# Patient Record
Sex: Male | Born: 1940
Health system: Southern US, Community
[De-identification: ages and names within clinical notes are randomized; demographics above are authoritative.]

## PROBLEM LIST (undated history)

## (undated) DIAGNOSIS — Z923 Personal history of irradiation: Secondary | ICD-10-CM

## (undated) DIAGNOSIS — Z21 Asymptomatic human immunodeficiency virus [HIV] infection status: Secondary | ICD-10-CM

## (undated) DIAGNOSIS — J449 Chronic obstructive pulmonary disease, unspecified: Secondary | ICD-10-CM

## (undated) DIAGNOSIS — K759 Inflammatory liver disease, unspecified: Secondary | ICD-10-CM

## (undated) DIAGNOSIS — M1990 Unspecified osteoarthritis, unspecified site: Secondary | ICD-10-CM

## (undated) DIAGNOSIS — M009 Pyogenic arthritis, unspecified: Secondary | ICD-10-CM

## (undated) DIAGNOSIS — Z972 Presence of dental prosthetic device (complete) (partial): Secondary | ICD-10-CM

## (undated) DIAGNOSIS — N183 Chronic kidney disease, stage 3 (moderate): Secondary | ICD-10-CM

## (undated) DIAGNOSIS — C7951 Secondary malignant neoplasm of bone: Secondary | ICD-10-CM

## (undated) DIAGNOSIS — J309 Allergic rhinitis, unspecified: Secondary | ICD-10-CM

## (undated) DIAGNOSIS — K219 Gastro-esophageal reflux disease without esophagitis: Secondary | ICD-10-CM

## (undated) DIAGNOSIS — Z973 Presence of spectacles and contact lenses: Secondary | ICD-10-CM

## (undated) DIAGNOSIS — H9192 Unspecified hearing loss, left ear: Secondary | ICD-10-CM

## (undated) DIAGNOSIS — I10 Essential (primary) hypertension: Secondary | ICD-10-CM

## (undated) DIAGNOSIS — C61 Malignant neoplasm of prostate: Secondary | ICD-10-CM

## (undated) DIAGNOSIS — M47812 Spondylosis without myelopathy or radiculopathy, cervical region: Secondary | ICD-10-CM

## (undated) DIAGNOSIS — B20 Human immunodeficiency virus [HIV] disease: Secondary | ICD-10-CM

## (undated) DIAGNOSIS — F1910 Other psychoactive substance abuse, uncomplicated: Secondary | ICD-10-CM

## (undated) DIAGNOSIS — Z86718 Personal history of other venous thrombosis and embolism: Secondary | ICD-10-CM

## (undated) DIAGNOSIS — J189 Pneumonia, unspecified organism: Secondary | ICD-10-CM

## (undated) DIAGNOSIS — K625 Hemorrhage of anus and rectum: Secondary | ICD-10-CM

## (undated) DIAGNOSIS — F1123 Opioid dependence with withdrawal: Secondary | ICD-10-CM

## (undated) DIAGNOSIS — M47816 Spondylosis without myelopathy or radiculopathy, lumbar region: Secondary | ICD-10-CM

## (undated) HISTORY — DX: Pneumonia, unspecified organism: J18.9

## (undated) HISTORY — DX: Hemorrhage of anus and rectum: K62.5

## (undated) HISTORY — DX: Personal history of irradiation: Z92.3

## (undated) HISTORY — PX: FOOT SURGERY: SHX648

## (undated) HISTORY — PX: HIP SURGERY: SHX245

## (undated) HISTORY — DX: Unspecified osteoarthritis, unspecified site: M19.90

## (undated) HISTORY — DX: Malignant neoplasm of prostate: C61

## (undated) HISTORY — DX: Secondary malignant neoplasm of bone: C79.51

## (undated) HISTORY — PX: HAND SURGERY: SHX662

## (undated) HISTORY — DX: Essential (primary) hypertension: I10

## (undated) HISTORY — DX: Other psychoactive substance abuse, uncomplicated: F19.10

## (undated) HISTORY — DX: Opioid dependence with withdrawal: F11.23

## (undated) HISTORY — DX: Chronic obstructive pulmonary disease, unspecified: J44.9

## (undated) HISTORY — PX: HERNIA REPAIR: SHX51

## (undated) HISTORY — DX: Inflammatory liver disease, unspecified: K75.9

## (undated) HISTORY — DX: Allergic rhinitis, unspecified: J30.9

## (undated) HISTORY — DX: Spondylosis without myelopathy or radiculopathy, lumbar region: M47.816

## (undated) HISTORY — DX: Spondylosis without myelopathy or radiculopathy, cervical region: M47.812

## (undated) HISTORY — DX: Chronic kidney disease, stage 3 (moderate): N18.3

## (undated) HISTORY — DX: Personal history of other venous thrombosis and embolism: Z86.718

## (undated) HISTORY — DX: Unspecified hearing loss, left ear: H91.92

## (undated) HISTORY — DX: Asymptomatic human immunodeficiency virus (hiv) infection status: Z21

## (undated) HISTORY — DX: Human immunodeficiency virus (HIV) disease: B20

---

## 1998-03-13 ENCOUNTER — Emergency Department (HOSPITAL_COMMUNITY): Admission: EM | Admit: 1998-03-13 | Discharge: 1998-03-13 | Payer: Self-pay | Admitting: Emergency Medicine

## 1999-04-05 ENCOUNTER — Encounter: Admission: RE | Admit: 1999-04-05 | Discharge: 1999-04-05 | Payer: Self-pay | Admitting: Infectious Diseases

## 1999-04-05 ENCOUNTER — Ambulatory Visit (HOSPITAL_COMMUNITY): Admission: RE | Admit: 1999-04-05 | Discharge: 1999-04-05 | Payer: Self-pay | Admitting: Infectious Diseases

## 1999-04-30 ENCOUNTER — Ambulatory Visit (HOSPITAL_COMMUNITY): Admission: RE | Admit: 1999-04-30 | Discharge: 1999-04-30 | Payer: Self-pay | Admitting: Infectious Diseases

## 1999-04-30 ENCOUNTER — Encounter: Admission: RE | Admit: 1999-04-30 | Discharge: 1999-04-30 | Payer: Self-pay | Admitting: Hematology and Oncology

## 1999-06-18 ENCOUNTER — Encounter: Admission: RE | Admit: 1999-06-18 | Discharge: 1999-06-18 | Payer: Self-pay | Admitting: Infectious Diseases

## 1999-08-08 ENCOUNTER — Encounter: Admission: RE | Admit: 1999-08-08 | Discharge: 1999-08-08 | Payer: Self-pay | Admitting: Infectious Diseases

## 1999-08-08 ENCOUNTER — Ambulatory Visit (HOSPITAL_COMMUNITY): Admission: RE | Admit: 1999-08-08 | Discharge: 1999-08-08 | Payer: Self-pay | Admitting: Infectious Diseases

## 1999-08-22 ENCOUNTER — Encounter: Admission: RE | Admit: 1999-08-22 | Discharge: 1999-08-22 | Payer: Self-pay | Admitting: Infectious Diseases

## 1999-10-29 ENCOUNTER — Encounter: Admission: RE | Admit: 1999-10-29 | Discharge: 1999-10-29 | Payer: Self-pay | Admitting: Hematology and Oncology

## 1999-10-29 ENCOUNTER — Ambulatory Visit (HOSPITAL_COMMUNITY): Admission: RE | Admit: 1999-10-29 | Discharge: 1999-10-29 | Payer: Self-pay | Admitting: Hematology and Oncology

## 1999-11-12 ENCOUNTER — Encounter: Admission: RE | Admit: 1999-11-12 | Discharge: 1999-11-12 | Payer: Self-pay | Admitting: Infectious Diseases

## 1999-12-10 ENCOUNTER — Encounter: Admission: RE | Admit: 1999-12-10 | Discharge: 1999-12-10 | Payer: Self-pay | Admitting: Infectious Diseases

## 1999-12-10 ENCOUNTER — Ambulatory Visit (HOSPITAL_COMMUNITY): Admission: RE | Admit: 1999-12-10 | Discharge: 1999-12-10 | Payer: Self-pay | Admitting: Infectious Diseases

## 1999-12-27 ENCOUNTER — Ambulatory Visit (HOSPITAL_COMMUNITY): Admission: RE | Admit: 1999-12-27 | Discharge: 1999-12-27 | Payer: Self-pay | Admitting: Family Medicine

## 1999-12-27 ENCOUNTER — Encounter: Payer: Self-pay | Admitting: Family Medicine

## 2000-04-21 ENCOUNTER — Ambulatory Visit (HOSPITAL_COMMUNITY): Admission: RE | Admit: 2000-04-21 | Discharge: 2000-04-21 | Payer: Self-pay | Admitting: Infectious Diseases

## 2000-04-21 ENCOUNTER — Encounter: Admission: RE | Admit: 2000-04-21 | Discharge: 2000-04-21 | Payer: Self-pay | Admitting: Infectious Diseases

## 2000-05-08 ENCOUNTER — Encounter: Admission: RE | Admit: 2000-05-08 | Discharge: 2000-05-08 | Payer: Self-pay | Admitting: Infectious Diseases

## 2000-08-07 ENCOUNTER — Encounter: Admission: RE | Admit: 2000-08-07 | Discharge: 2000-08-07 | Payer: Self-pay | Admitting: Infectious Diseases

## 2000-09-12 ENCOUNTER — Encounter: Admission: RE | Admit: 2000-09-12 | Discharge: 2000-09-12 | Payer: Self-pay | Admitting: Infectious Diseases

## 2001-04-23 ENCOUNTER — Emergency Department (HOSPITAL_COMMUNITY): Admission: RE | Admit: 2001-04-23 | Discharge: 2001-04-23 | Payer: Self-pay | Admitting: Family Medicine

## 2001-04-23 ENCOUNTER — Encounter: Payer: Self-pay | Admitting: Family Medicine

## 2001-05-04 ENCOUNTER — Ambulatory Visit (HOSPITAL_BASED_OUTPATIENT_CLINIC_OR_DEPARTMENT_OTHER): Admission: RE | Admit: 2001-05-04 | Discharge: 2001-05-04 | Payer: Self-pay | Admitting: *Deleted

## 2001-07-19 ENCOUNTER — Emergency Department (HOSPITAL_COMMUNITY): Admission: EM | Admit: 2001-07-19 | Discharge: 2001-07-19 | Payer: Self-pay | Admitting: Emergency Medicine

## 2001-07-19 ENCOUNTER — Encounter: Payer: Self-pay | Admitting: Emergency Medicine

## 2003-04-20 ENCOUNTER — Inpatient Hospital Stay (HOSPITAL_COMMUNITY): Admission: EM | Admit: 2003-04-20 | Discharge: 2003-04-22 | Payer: Self-pay | Admitting: *Deleted

## 2003-04-22 ENCOUNTER — Encounter: Payer: Self-pay | Admitting: Internal Medicine

## 2003-04-26 ENCOUNTER — Encounter: Admission: RE | Admit: 2003-04-26 | Discharge: 2003-04-26 | Payer: Self-pay | Admitting: Family Medicine

## 2004-01-04 ENCOUNTER — Ambulatory Visit (HOSPITAL_COMMUNITY): Admission: RE | Admit: 2004-01-04 | Discharge: 2004-01-04 | Payer: Self-pay | Admitting: Internal Medicine

## 2004-03-29 ENCOUNTER — Ambulatory Visit: Payer: Self-pay | Admitting: Family Medicine

## 2004-05-03 ENCOUNTER — Ambulatory Visit: Payer: Self-pay | Admitting: Family Medicine

## 2004-06-28 ENCOUNTER — Ambulatory Visit: Payer: Self-pay | Admitting: Family Medicine

## 2004-08-19 ENCOUNTER — Emergency Department (HOSPITAL_COMMUNITY): Admission: EM | Admit: 2004-08-19 | Discharge: 2004-08-20 | Payer: Self-pay | Admitting: Emergency Medicine

## 2004-09-03 ENCOUNTER — Ambulatory Visit: Payer: Self-pay | Admitting: Family Medicine

## 2005-07-04 ENCOUNTER — Ambulatory Visit: Payer: Self-pay | Admitting: Internal Medicine

## 2005-07-18 ENCOUNTER — Ambulatory Visit: Payer: Self-pay | Admitting: Internal Medicine

## 2005-08-01 ENCOUNTER — Ambulatory Visit: Payer: Self-pay | Admitting: Internal Medicine

## 2005-10-17 ENCOUNTER — Ambulatory Visit: Payer: Self-pay | Admitting: Internal Medicine

## 2006-01-09 ENCOUNTER — Ambulatory Visit: Payer: Self-pay | Admitting: Internal Medicine

## 2006-03-20 ENCOUNTER — Ambulatory Visit: Payer: Self-pay | Admitting: Internal Medicine

## 2006-03-25 ENCOUNTER — Ambulatory Visit (HOSPITAL_COMMUNITY): Admission: RE | Admit: 2006-03-25 | Discharge: 2006-03-25 | Payer: Self-pay | Admitting: Internal Medicine

## 2006-04-03 ENCOUNTER — Ambulatory Visit: Payer: Self-pay | Admitting: Internal Medicine

## 2006-05-15 ENCOUNTER — Ambulatory Visit: Payer: Self-pay | Admitting: Internal Medicine

## 2006-07-03 ENCOUNTER — Ambulatory Visit: Payer: Self-pay | Admitting: Internal Medicine

## 2006-07-31 ENCOUNTER — Ambulatory Visit: Payer: Self-pay | Admitting: Internal Medicine

## 2006-09-22 ENCOUNTER — Ambulatory Visit: Payer: Self-pay | Admitting: Internal Medicine

## 2006-10-30 DIAGNOSIS — M1990 Unspecified osteoarthritis, unspecified site: Secondary | ICD-10-CM | POA: Insufficient documentation

## 2006-10-30 DIAGNOSIS — B182 Chronic viral hepatitis C: Secondary | ICD-10-CM | POA: Insufficient documentation

## 2006-10-30 DIAGNOSIS — B1910 Unspecified viral hepatitis B without hepatic coma: Secondary | ICD-10-CM | POA: Insufficient documentation

## 2006-10-30 DIAGNOSIS — M545 Low back pain: Secondary | ICD-10-CM | POA: Insufficient documentation

## 2006-10-30 DIAGNOSIS — B20 Human immunodeficiency virus [HIV] disease: Secondary | ICD-10-CM | POA: Insufficient documentation

## 2006-10-30 DIAGNOSIS — I10 Essential (primary) hypertension: Secondary | ICD-10-CM | POA: Insufficient documentation

## 2006-11-20 ENCOUNTER — Ambulatory Visit: Payer: Self-pay | Admitting: Internal Medicine

## 2007-02-19 ENCOUNTER — Ambulatory Visit: Payer: Self-pay | Admitting: Internal Medicine

## 2007-02-19 LAB — CONVERTED CEMR LAB
Albumin: 3.8 g/dL (ref 3.5–5.2)
CO2: 26 meq/L (ref 19–32)
Chloride: 105 meq/L (ref 96–112)
Glucose, Bld: 87 mg/dL (ref 70–99)
HCT: 47.1 % (ref 39.0–52.0)
HIV 1 RNA Quant: 1970 copies/mL — ABNORMAL HIGH (ref <50)
HIV-1 RNA Quant, Log: 3.29 — ABNORMAL HIGH (ref <1.70)
Lymphocytes Relative: 51 % — ABNORMAL HIGH (ref 12–46)
Lymphs Abs: 1.7 10*3/uL (ref 0.7–3.3)
Neutrophils Relative %: 28 % — ABNORMAL LOW (ref 43–77)
Platelets: 130 10*3/uL — ABNORMAL LOW (ref 150–400)
Potassium: 4.5 meq/L (ref 3.5–5.3)
Sodium: 138 meq/L (ref 135–145)
Total Protein: 8.8 g/dL — ABNORMAL HIGH (ref 6.0–8.3)
WBC: 3.3 10*3/uL — ABNORMAL LOW (ref 4.0–10.5)

## 2007-04-02 ENCOUNTER — Ambulatory Visit: Payer: Self-pay | Admitting: Internal Medicine

## 2007-06-18 ENCOUNTER — Ambulatory Visit: Payer: Self-pay | Admitting: Internal Medicine

## 2007-06-18 LAB — CONVERTED CEMR LAB
ALT: 69 units/L — ABNORMAL HIGH (ref 0–53)
AST: 93 units/L — ABNORMAL HIGH (ref 0–37)
Absolute CD4: 427 #/uL (ref 381–1469)
Basophils Absolute: 0 10*3/uL (ref 0.0–0.1)
Basophils Relative: 1 % (ref 0–1)
CD4 T Helper %: 23 % — ABNORMAL LOW (ref 32–62)
CO2: 23 meq/L (ref 19–32)
Eosinophils Relative: 1 % (ref 0–5)
HCT: 46.7 % (ref 39.0–52.0)
HIV 1 RNA Quant: 1830 copies/mL — ABNORMAL HIGH (ref <50)
Lymphocytes Relative: 53 % — ABNORMAL HIGH (ref 12–46)
Neutro Abs: 1 10*3/uL — ABNORMAL LOW (ref 1.7–7.7)
Platelets: 119 10*3/uL — ABNORMAL LOW (ref 150–400)
RDW: 13.5 % (ref 11.5–15.5)
Sodium: 137 meq/L (ref 135–145)
Total Bilirubin: 0.8 mg/dL (ref 0.3–1.2)
Total Protein: 9 g/dL — ABNORMAL HIGH (ref 6.0–8.3)
Total lymphocyte count: 1855 cells/mcL (ref 700–3300)

## 2007-07-13 ENCOUNTER — Ambulatory Visit: Payer: Self-pay | Admitting: Internal Medicine

## 2007-07-14 ENCOUNTER — Ambulatory Visit (HOSPITAL_COMMUNITY): Admission: RE | Admit: 2007-07-14 | Discharge: 2007-07-14 | Payer: Self-pay | Admitting: Internal Medicine

## 2007-10-05 ENCOUNTER — Emergency Department (HOSPITAL_COMMUNITY): Admission: EM | Admit: 2007-10-05 | Discharge: 2007-10-05 | Payer: Self-pay | Admitting: Emergency Medicine

## 2007-10-06 ENCOUNTER — Emergency Department (HOSPITAL_COMMUNITY): Admission: EM | Admit: 2007-10-06 | Discharge: 2007-10-06 | Payer: Self-pay | Admitting: Emergency Medicine

## 2007-10-22 ENCOUNTER — Ambulatory Visit: Payer: Self-pay | Admitting: Internal Medicine

## 2007-10-22 LAB — CONVERTED CEMR LAB
Albumin: 4.1 g/dL (ref 3.5–5.2)
BUN: 20 mg/dL (ref 6–23)
CD4 T Helper %: 23 % — ABNORMAL LOW (ref 32–62)
CO2: 26 meq/L (ref 19–32)
Calcium: 9.7 mg/dL (ref 8.4–10.5)
Chloride: 101 meq/L (ref 96–112)
Glucose, Bld: 83 mg/dL (ref 70–99)
HIV-1 RNA Quant, Log: 3.19 — ABNORMAL HIGH (ref <1.70)
Lymphocytes Relative: 55 % — ABNORMAL HIGH (ref 12–46)
Lymphs Abs: 1.8 10*3/uL (ref 0.7–4.0)
MCV: 89.9 fL (ref 78.0–100.0)
Monocytes Relative: 16 % — ABNORMAL HIGH (ref 3–12)
Neutro Abs: 0.9 10*3/uL — ABNORMAL LOW (ref 1.7–7.7)
Neutrophils Relative %: 27 % — ABNORMAL LOW (ref 43–77)
Platelets: 136 10*3/uL — ABNORMAL LOW (ref 150–400)
Potassium: 4 meq/L (ref 3.5–5.3)
RBC: 4.76 M/uL (ref 4.22–5.81)
Sodium: 137 meq/L (ref 135–145)
Total Protein: 8.9 g/dL — ABNORMAL HIGH (ref 6.0–8.3)
WBC, lymph enumeration: 3.3 10*3/uL — ABNORMAL LOW (ref 4.0–10.5)
WBC: 3.3 10*3/uL — ABNORMAL LOW (ref 4.0–10.5)

## 2007-11-05 ENCOUNTER — Ambulatory Visit: Payer: Self-pay | Admitting: Internal Medicine

## 2007-12-14 ENCOUNTER — Ambulatory Visit: Payer: Self-pay | Admitting: Internal Medicine

## 2008-01-11 ENCOUNTER — Ambulatory Visit: Payer: Self-pay | Admitting: Internal Medicine

## 2008-01-11 ENCOUNTER — Encounter: Payer: Self-pay | Admitting: Internal Medicine

## 2008-01-11 LAB — CONVERTED CEMR LAB
ALT: 46 units/L (ref 0–53)
Absolute CD4: 496 #/uL (ref 381–1469)
Alkaline Phosphatase: 76 units/L (ref 39–117)
Basophils Relative: 1 % (ref 0–1)
CD4 T Helper %: 26 % — ABNORMAL LOW (ref 32–62)
CO2: 24 meq/L (ref 19–32)
Eosinophils Absolute: 0.1 10*3/uL (ref 0.0–0.7)
HCT: 45.5 % (ref 39.0–52.0)
Hemoglobin: 14.9 g/dL (ref 13.0–17.0)
Lymphocytes Relative: 53 % — ABNORMAL HIGH (ref 12–46)
MCHC: 32.7 g/dL (ref 30.0–36.0)
Monocytes Absolute: 0.6 10*3/uL (ref 0.1–1.0)
Monocytes Relative: 17 % — ABNORMAL HIGH (ref 3–12)
Neutro Abs: 0.9 10*3/uL — ABNORMAL LOW (ref 1.7–7.7)
RBC: 5.08 M/uL (ref 4.22–5.81)
RDW: 13.8 % (ref 11.5–15.5)
Sodium: 139 meq/L (ref 135–145)
Total Bilirubin: 0.4 mg/dL (ref 0.3–1.2)
Total Lymphocytes %: 53 % — ABNORMAL HIGH (ref 12–46)
Total Protein: 8.4 g/dL — ABNORMAL HIGH (ref 6.0–8.3)
Total lymphocyte count: 1908 cells/mcL (ref 700–3300)

## 2008-03-17 ENCOUNTER — Ambulatory Visit: Payer: Self-pay | Admitting: Internal Medicine

## 2008-04-04 ENCOUNTER — Ambulatory Visit: Payer: Self-pay | Admitting: Internal Medicine

## 2008-06-09 ENCOUNTER — Ambulatory Visit: Payer: Self-pay | Admitting: Internal Medicine

## 2008-06-10 ENCOUNTER — Encounter: Payer: Self-pay | Admitting: Internal Medicine

## 2008-06-10 LAB — CONVERTED CEMR LAB
ALT: 51 units/L (ref 0–53)
Albumin: 3.6 g/dL (ref 3.5–5.2)
Alkaline Phosphatase: 73 units/L (ref 39–117)
CD4 T Helper %: 23 % — ABNORMAL LOW (ref 32–62)
CO2: 19 meq/L (ref 19–32)
Glucose, Bld: 82 mg/dL (ref 70–99)
HIV 1 RNA Quant: 11100 copies/mL — ABNORMAL HIGH (ref <48)
HIV-1 RNA Quant, Log: 4.05 — ABNORMAL HIGH (ref <1.68)
Lymphocytes Relative: 55 % — ABNORMAL HIGH (ref 12–46)
Lymphs Abs: 2.1 10*3/uL (ref 0.7–4.0)
Neutrophils Relative %: 29 % — ABNORMAL LOW (ref 43–77)
Platelets: 149 10*3/uL — ABNORMAL LOW (ref 150–400)
Potassium: 4.2 meq/L (ref 3.5–5.3)
Sodium: 139 meq/L (ref 135–145)
Total Protein: 7.8 g/dL (ref 6.0–8.3)
WBC: 3.9 10*3/uL — ABNORMAL LOW (ref 4.0–10.5)

## 2008-07-07 ENCOUNTER — Telehealth: Payer: Self-pay | Admitting: Internal Medicine

## 2008-07-12 ENCOUNTER — Telehealth: Payer: Self-pay | Admitting: Internal Medicine

## 2008-08-10 ENCOUNTER — Encounter: Payer: Self-pay | Admitting: Internal Medicine

## 2008-08-11 ENCOUNTER — Encounter: Payer: Self-pay | Admitting: Internal Medicine

## 2008-08-11 ENCOUNTER — Ambulatory Visit: Payer: Self-pay | Admitting: Internal Medicine

## 2008-09-08 ENCOUNTER — Encounter: Payer: Self-pay | Admitting: Internal Medicine

## 2008-09-08 ENCOUNTER — Ambulatory Visit: Payer: Self-pay | Admitting: Internal Medicine

## 2008-09-08 DIAGNOSIS — H9190 Unspecified hearing loss, unspecified ear: Secondary | ICD-10-CM | POA: Insufficient documentation

## 2008-09-08 LAB — CONVERTED CEMR LAB
ALT: 59 units/L — ABNORMAL HIGH (ref 0–53)
Albumin: 3.9 g/dL (ref 3.5–5.2)
Basophils Relative: 1 % (ref 0–1)
CD4 Count: 382 microliters
CO2: 25 meq/L (ref 19–32)
Calcium: 9.3 mg/dL (ref 8.4–10.5)
Chloride: 106 meq/L (ref 96–112)
Glucose, Bld: 80 mg/dL (ref 70–99)
HIV 1 RNA Quant: 8880 copies/mL — ABNORMAL HIGH (ref <48)
HIV-1 RNA Quant, Log: 3.95 — ABNORMAL HIGH (ref <1.68)
Hemoglobin: 14.8 g/dL (ref 13.0–17.0)
Lymphs Abs: 1.6 10*3/uL (ref 0.7–4.0)
MCHC: 32.4 g/dL (ref 30.0–36.0)
Monocytes Absolute: 0.7 10*3/uL (ref 0.1–1.0)
Monocytes Relative: 19 % — ABNORMAL HIGH (ref 3–12)
Neutro Abs: 1.3 10*3/uL — ABNORMAL LOW (ref 1.7–7.7)
Potassium: 3.9 meq/L (ref 3.5–5.3)
RBC: 5.28 M/uL (ref 4.22–5.81)
Sodium: 139 meq/L (ref 135–145)
Total Protein: 8.4 g/dL — ABNORMAL HIGH (ref 6.0–8.3)
WBC: 3.7 10*3/uL — ABNORMAL LOW (ref 4.0–10.5)

## 2008-09-15 ENCOUNTER — Encounter: Payer: Self-pay | Admitting: Internal Medicine

## 2008-09-15 ENCOUNTER — Ambulatory Visit: Payer: Self-pay | Admitting: Internal Medicine

## 2008-10-06 ENCOUNTER — Encounter: Payer: Self-pay | Admitting: Internal Medicine

## 2008-10-06 ENCOUNTER — Ambulatory Visit: Payer: Self-pay | Admitting: Internal Medicine

## 2008-10-11 ENCOUNTER — Telehealth: Payer: Self-pay | Admitting: Internal Medicine

## 2008-10-19 ENCOUNTER — Encounter: Payer: Self-pay | Admitting: Internal Medicine

## 2008-11-17 ENCOUNTER — Ambulatory Visit: Payer: Self-pay | Admitting: Internal Medicine

## 2008-11-17 ENCOUNTER — Telehealth: Payer: Self-pay | Admitting: Internal Medicine

## 2008-11-17 LAB — CONVERTED CEMR LAB
ALT: 53 units/L (ref 0–53)
AST: 74 units/L — ABNORMAL HIGH (ref 0–37)
Absolute CD4: 510 #/uL (ref 381–1469)
Albumin: 3.8 g/dL (ref 3.5–5.2)
BUN: 15 mg/dL (ref 6–23)
CO2: 25 meq/L (ref 19–32)
Calcium: 9.2 mg/dL (ref 8.4–10.5)
Chloride: 103 meq/L (ref 96–112)
Creatinine, Ser: 1.13 mg/dL (ref 0.40–1.50)
Eosinophils Absolute: 0.1 10*3/uL (ref 0.0–0.7)
Eosinophils Relative: 3 % (ref 0–5)
HCT: 39.2 % (ref 39.0–52.0)
HIV 1 RNA Quant: 220 copies/mL — ABNORMAL HIGH (ref <48)
Lymphocytes Relative: 53 % — ABNORMAL HIGH (ref 12–46)
Lymphs Abs: 2 10*3/uL (ref 0.7–4.0)
MCV: 86 fL (ref 78.0–100.0)
Monocytes Relative: 16 % — ABNORMAL HIGH (ref 3–12)
Platelets: 184 10*3/uL (ref 150–400)
Potassium: 3.7 meq/L (ref 3.5–5.3)
RBC: 4.56 M/uL (ref 4.22–5.81)
WBC: 3.7 10*3/uL — ABNORMAL LOW (ref 4.0–10.5)

## 2008-12-15 ENCOUNTER — Encounter: Payer: Self-pay | Admitting: Internal Medicine

## 2008-12-15 ENCOUNTER — Ambulatory Visit: Payer: Self-pay | Admitting: Internal Medicine

## 2009-01-12 ENCOUNTER — Encounter: Payer: Self-pay | Admitting: Internal Medicine

## 2009-01-12 ENCOUNTER — Ambulatory Visit: Payer: Self-pay | Admitting: Internal Medicine

## 2009-02-16 ENCOUNTER — Ambulatory Visit: Payer: Self-pay | Admitting: Internal Medicine

## 2009-02-16 ENCOUNTER — Encounter: Payer: Self-pay | Admitting: Internal Medicine

## 2009-02-22 ENCOUNTER — Encounter: Payer: Self-pay | Admitting: Internal Medicine

## 2009-02-23 ENCOUNTER — Ambulatory Visit: Payer: Self-pay | Admitting: Internal Medicine

## 2009-02-23 LAB — CONVERTED CEMR LAB
AST: 43 units/L — ABNORMAL HIGH (ref 0–37)
Albumin: 4.2 g/dL (ref 3.5–5.2)
BUN: 25 mg/dL — ABNORMAL HIGH (ref 6–23)
CO2: 22 meq/L (ref 19–32)
Calcium: 9.2 mg/dL (ref 8.4–10.5)
Chloride: 104 meq/L (ref 96–112)
Eosinophils Absolute: 0.1 10*3/uL (ref 0.0–0.7)
Eosinophils Relative: 3 % (ref 0–5)
Glucose, Bld: 71 mg/dL (ref 70–99)
HCT: 40.8 % (ref 39.0–52.0)
HIV 1 RNA Quant: <48 copies/mL (ref <48)
Hemoglobin: 13.8 g/dL (ref 13.0–17.0)
Lymphocytes Relative: 38 % (ref 12–46)
Lymphs Abs: 1.5 10*3/uL (ref 0.7–4.0)
MCV: 88.1 fL (ref 78.0–100.0)
Monocytes Absolute: 0.8 10*3/uL (ref 0.1–1.0)
Monocytes Relative: 20 % — ABNORMAL HIGH (ref 3–12)
Potassium: 3.6 meq/L (ref 3.5–5.3)
Total lymphocyte count: 1482 cells/mcL (ref 700–3300)
WBC: 3.9 10*3/uL — ABNORMAL LOW (ref 4.0–10.5)

## 2009-02-27 ENCOUNTER — Encounter: Payer: Self-pay | Admitting: Internal Medicine

## 2009-02-27 ENCOUNTER — Ambulatory Visit: Payer: Self-pay | Admitting: Internal Medicine

## 2009-03-09 ENCOUNTER — Encounter: Payer: Self-pay | Admitting: Internal Medicine

## 2009-03-09 ENCOUNTER — Ambulatory Visit: Payer: Self-pay | Admitting: Internal Medicine

## 2009-03-15 ENCOUNTER — Ambulatory Visit: Payer: Self-pay | Admitting: Internal Medicine

## 2009-03-15 LAB — CONVERTED CEMR LAB
Absolute CD4: 346 #/uL — ABNORMAL LOW (ref 381–1469)
Basophils Absolute: 0 10*3/uL (ref 0.0–0.1)
Basophils Relative: 1 % (ref 0–1)
CD4 T Helper %: 23 % — ABNORMAL LOW (ref 32–62)
CO2: 22 meq/L (ref 19–32)
Creatinine, Ser: 1 mg/dL (ref 0.40–1.50)
Eosinophils Absolute: 0.1 10*3/uL (ref 0.0–0.7)
Glucose, Bld: 104 mg/dL — ABNORMAL HIGH (ref 70–99)
HIV 1 RNA Quant: <48 copies/mL (ref <48)
HIV-1 RNA Quant, Log: <1.68 (ref <1.68)
MCHC: 33.3 g/dL (ref 30.0–36.0)
MCV: 89.3 fL (ref 78.0–100.0)
Monocytes Absolute: 0.7 10*3/uL (ref 0.1–1.0)
Monocytes Relative: 17 % — ABNORMAL HIGH (ref 3–12)
Neutrophils Relative %: 46 % (ref 43–77)
RBC: 4.6 M/uL (ref 4.22–5.81)
RDW: 13.9 % (ref 11.5–15.5)
Total Bilirubin: 0.3 mg/dL (ref 0.3–1.2)

## 2009-03-30 ENCOUNTER — Encounter: Payer: Self-pay | Admitting: Internal Medicine

## 2009-03-30 ENCOUNTER — Ambulatory Visit: Payer: Self-pay | Admitting: Internal Medicine

## 2009-04-18 ENCOUNTER — Encounter: Payer: Self-pay | Admitting: Internal Medicine

## 2009-04-20 ENCOUNTER — Encounter: Payer: Self-pay | Admitting: Internal Medicine

## 2009-04-20 ENCOUNTER — Ambulatory Visit: Payer: Self-pay | Admitting: Internal Medicine

## 2009-04-24 ENCOUNTER — Telehealth: Payer: Self-pay | Admitting: Internal Medicine

## 2009-05-04 ENCOUNTER — Encounter: Payer: Self-pay | Admitting: Internal Medicine

## 2009-05-15 ENCOUNTER — Encounter: Payer: Self-pay | Admitting: Internal Medicine

## 2009-06-08 ENCOUNTER — Encounter: Admission: RE | Admit: 2009-06-08 | Discharge: 2009-07-18 | Payer: Self-pay | Admitting: Orthopedic Surgery

## 2009-06-09 ENCOUNTER — Telehealth: Payer: Self-pay | Admitting: Internal Medicine

## 2009-06-19 ENCOUNTER — Encounter: Payer: Self-pay | Admitting: Internal Medicine

## 2009-06-19 ENCOUNTER — Ambulatory Visit: Payer: Self-pay | Admitting: Internal Medicine

## 2009-06-19 LAB — CONVERTED CEMR LAB
AST: 55 units/L — ABNORMAL HIGH (ref 0–37)
Albumin: 3.9 g/dL (ref 3.5–5.2)
Alkaline Phosphatase: 107 units/L (ref 39–117)
BUN: 13 mg/dL (ref 6–23)
Basophils Absolute: 0.1 10*3/uL (ref 0.0–0.1)
Lymphocytes Relative: 42 % (ref 12–46)
Neutro Abs: 1.3 10*3/uL — ABNORMAL LOW (ref 1.7–7.7)
Neutrophils Relative %: 34 % — ABNORMAL LOW (ref 43–77)
Platelets: 235 10*3/uL (ref 150–400)
Potassium: 4.1 meq/L (ref 3.5–5.3)
RDW: 14.1 % (ref 11.5–15.5)
Sodium: 136 meq/L (ref 135–145)
Total lymphocyte count: 1638 cells/mcL (ref 700–3300)

## 2009-07-03 ENCOUNTER — Ambulatory Visit: Payer: Self-pay | Admitting: Internal Medicine

## 2009-07-03 ENCOUNTER — Encounter: Payer: Self-pay | Admitting: Internal Medicine

## 2009-07-24 ENCOUNTER — Telehealth: Payer: Self-pay | Admitting: Internal Medicine

## 2009-08-31 ENCOUNTER — Encounter: Payer: Self-pay | Admitting: Internal Medicine

## 2009-08-31 ENCOUNTER — Ambulatory Visit: Payer: Self-pay | Admitting: Internal Medicine

## 2009-10-02 ENCOUNTER — Ambulatory Visit: Payer: Self-pay | Admitting: Internal Medicine

## 2009-10-02 LAB — CONVERTED CEMR LAB
ALT: 45 units/L (ref 0–53)
Albumin: 4.1 g/dL (ref 3.5–5.2)
CO2: 22 meq/L (ref 19–32)
Calcium: 9.4 mg/dL (ref 8.4–10.5)
Chloride: 109 meq/L (ref 96–112)
Cholesterol: 180 mg/dL (ref 0–200)
Eosinophils Absolute: 0.1 10*3/uL (ref 0.0–0.7)
HIV 1 RNA Quant: <48 copies/mL (ref <48)
HIV-1 RNA Quant, Log: <1.68 (ref <1.68)
Lymphocytes Relative: 33 % (ref 12–46)
Lymphs Abs: 1.3 10*3/uL (ref 0.7–4.0)
MCV: 85.7 fL (ref 78.0–100.0)
Monocytes Relative: 22 % — ABNORMAL HIGH (ref 3–12)
Neutro Abs: 1.6 10*3/uL — ABNORMAL LOW (ref 1.7–7.7)
Neutrophils Relative %: 42 % — ABNORMAL LOW (ref 43–77)
Potassium: 4.5 meq/L (ref 3.5–5.3)
RBC: 5.04 M/uL (ref 4.22–5.81)
Sodium: 140 meq/L (ref 135–145)
Total Protein: 7.7 g/dL (ref 6.0–8.3)
WBC: 3.8 10*3/uL — ABNORMAL LOW (ref 4.0–10.5)

## 2009-10-03 ENCOUNTER — Encounter: Payer: Self-pay | Admitting: Internal Medicine

## 2009-10-12 ENCOUNTER — Ambulatory Visit: Payer: Self-pay | Admitting: Internal Medicine

## 2009-11-06 ENCOUNTER — Telehealth: Payer: Self-pay | Admitting: Internal Medicine

## 2009-11-07 ENCOUNTER — Telehealth (INDEPENDENT_AMBULATORY_CARE_PROVIDER_SITE_OTHER): Payer: Self-pay | Admitting: *Deleted

## 2009-11-27 ENCOUNTER — Telehealth: Payer: Self-pay | Admitting: Internal Medicine

## 2009-11-30 ENCOUNTER — Encounter: Payer: Self-pay | Admitting: Internal Medicine

## 2009-12-02 ENCOUNTER — Emergency Department (HOSPITAL_COMMUNITY): Admission: EM | Admit: 2009-12-02 | Discharge: 2009-12-02 | Payer: Self-pay | Admitting: Emergency Medicine

## 2009-12-15 ENCOUNTER — Ambulatory Visit: Payer: Self-pay | Admitting: Internal Medicine

## 2009-12-15 DIAGNOSIS — J159 Unspecified bacterial pneumonia: Secondary | ICD-10-CM | POA: Insufficient documentation

## 2010-01-09 ENCOUNTER — Encounter: Payer: Self-pay | Admitting: Internal Medicine

## 2010-01-10 ENCOUNTER — Telehealth: Payer: Self-pay | Admitting: Internal Medicine

## 2010-03-20 ENCOUNTER — Ambulatory Visit: Payer: Self-pay | Admitting: Internal Medicine

## 2010-03-20 LAB — CONVERTED CEMR LAB
ALT: 45 units/L (ref 0–53)
AST: 47 units/L — ABNORMAL HIGH (ref 0–37)
Albumin: 4 g/dL (ref 3.5–5.2)
BUN: 18 mg/dL (ref 6–23)
Basophils Absolute: 0.1 10*3/uL (ref 0.0–0.1)
CO2: 26 meq/L (ref 19–32)
Calcium: 9.3 mg/dL (ref 8.4–10.5)
Chloride: 100 meq/L (ref 96–112)
Creatinine, Ser: 1.1 mg/dL (ref 0.40–1.50)
HIV-1 RNA Quant, Log: <1.3 (ref <1.30)
Hemoglobin: 14 g/dL (ref 13.0–17.0)
Hepatitis B Surface Ag: NEGATIVE
Lymphocytes Relative: 43 % (ref 12–46)
Monocytes Absolute: 0.4 10*3/uL (ref 0.1–1.0)
Monocytes Relative: 16 % — ABNORMAL HIGH (ref 3–12)
Neutro Abs: 1 10*3/uL — ABNORMAL LOW (ref 1.7–7.7)
Neutrophils Relative %: 36 % — ABNORMAL LOW (ref 43–77)
Potassium: 4.1 meq/L (ref 3.5–5.3)
RBC: 4.75 M/uL (ref 4.22–5.81)

## 2010-04-04 ENCOUNTER — Ambulatory Visit: Payer: Self-pay | Admitting: Internal Medicine

## 2010-04-04 DIAGNOSIS — N529 Male erectile dysfunction, unspecified: Secondary | ICD-10-CM | POA: Insufficient documentation

## 2010-04-10 ENCOUNTER — Observation Stay (HOSPITAL_COMMUNITY)
Admission: EM | Admit: 2010-04-10 | Discharge: 2010-04-11 | Payer: Self-pay | Source: Home / Self Care | Admitting: Emergency Medicine

## 2010-04-10 ENCOUNTER — Encounter: Payer: Self-pay | Admitting: Internal Medicine

## 2010-04-10 ENCOUNTER — Ambulatory Visit: Payer: Self-pay | Admitting: Internal Medicine

## 2010-04-11 ENCOUNTER — Encounter: Payer: Self-pay | Admitting: Internal Medicine

## 2010-05-02 LAB — CONVERTED CEMR LAB
ALT: 46 units/L (ref 0–53)
AST: 44 units/L — ABNORMAL HIGH (ref 0–37)
Alkaline Phosphatase: 102 units/L (ref 39–117)
Basophils Absolute: 0 10*3/uL (ref 0.0–0.1)
Basophils Relative: 1 % (ref 0–1)
CO2: 25 meq/L (ref 19–32)
Creatinine, Ser: 0.96 mg/dL (ref 0.40–1.50)
Eosinophils Absolute: 0.1 10*3/uL (ref 0.0–0.7)
Eosinophils Relative: 1 % (ref 0–5)
HCT: 40.2 % (ref 39.0–52.0)
Hemoglobin: 13.1 g/dL (ref 13.0–17.0)
Lymphocytes Relative: 40 % (ref 12–46)
MCHC: 32.6 g/dL (ref 30.0–36.0)
MCV: 88.7 fL (ref 78.0–100.0)
Monocytes Absolute: 0.7 10*3/uL (ref 0.1–1.0)
Platelets: 185 10*3/uL (ref 150–400)
RDW: 14.2 % (ref 11.5–15.5)
Sodium: 139 meq/L (ref 135–145)
Testosterone: 793.51 ng/dL (ref 250–890)
Total Bilirubin: 0.3 mg/dL (ref 0.3–1.2)
Total Protein: 7.4 g/dL (ref 6.0–8.3)

## 2010-05-08 ENCOUNTER — Encounter: Payer: Self-pay | Admitting: Internal Medicine

## 2010-05-10 ENCOUNTER — Telehealth (INDEPENDENT_AMBULATORY_CARE_PROVIDER_SITE_OTHER): Payer: Self-pay | Admitting: *Deleted

## 2010-06-23 ENCOUNTER — Encounter: Payer: Self-pay | Admitting: Internal Medicine

## 2010-06-23 ENCOUNTER — Observation Stay (HOSPITAL_COMMUNITY)
Admission: EM | Admit: 2010-06-23 | Discharge: 2010-06-25 | Payer: Self-pay | Source: Home / Self Care | Attending: Internal Medicine | Admitting: Internal Medicine

## 2010-06-25 ENCOUNTER — Encounter: Payer: Self-pay | Admitting: Internal Medicine

## 2010-06-26 LAB — CBC
HCT: 41.1 % (ref 39.0–52.0)
HCT: 37.5 % — ABNORMAL LOW (ref 39.0–52.0)
Hemoglobin: 13.8 g/dL (ref 13.0–17.0)
Hemoglobin: 12.1 g/dL — ABNORMAL LOW (ref 13.0–17.0)
MCH: 29.4 pg (ref 26.0–34.0)
MCH: 28.2 pg (ref 26.0–34.0)
MCHC: 33.6 g/dL (ref 30.0–36.0)
MCV: 87.4 fL (ref 78.0–100.0)
MCV: 87.4 fL (ref 78.0–100.0)
Platelets: 201 10*3/uL (ref 150–400)
RBC: 4.7 MIL/uL (ref 4.22–5.81)
RBC: 4.29 MIL/uL (ref 4.22–5.81)
RDW: 13.2 % (ref 11.5–15.5)
WBC: 6.3 10*3/uL (ref 4.0–10.5)

## 2010-06-26 LAB — HEPATIC FUNCTION PANEL
ALT: 44 U/L (ref 0–53)
AST: 48 U/L — ABNORMAL HIGH (ref 0–37)
Albumin: 4 g/dL (ref 3.5–5.2)
Alkaline Phosphatase: 97 U/L (ref 39–117)
Bilirubin, Direct: 0.1 mg/dL (ref 0.0–0.3)
Indirect Bilirubin: 0.2 mg/dL — ABNORMAL LOW (ref 0.3–0.9)
Total Bilirubin: 0.3 mg/dL (ref 0.3–1.2)
Total Protein: 8 g/dL (ref 6.0–8.3)

## 2010-06-26 LAB — DIFFERENTIAL
Basophils Absolute: 0.1 10*3/uL (ref 0.0–0.1)
Basophils Relative: 1 % (ref 0–1)
Eosinophils Absolute: 0.1 10*3/uL (ref 0.0–0.7)
Eosinophils Relative: 1 % (ref 0–5)
Lymphocytes Relative: 10 % — ABNORMAL LOW (ref 12–46)
Lymphs Abs: 0.6 10*3/uL — ABNORMAL LOW (ref 0.7–4.0)
Lymphs Abs: 1 10*3/uL (ref 0.7–4.0)
Monocytes Absolute: 0.9 10*3/uL (ref 0.1–1.0)
Monocytes Absolute: 0.8 10*3/uL (ref 0.1–1.0)
Monocytes Relative: 14 % — ABNORMAL HIGH (ref 3–12)
Monocytes Relative: 17 % — ABNORMAL HIGH (ref 3–12)
Neutro Abs: 4.7 10*3/uL (ref 1.7–7.7)
Neutro Abs: 2.8 10*3/uL (ref 1.7–7.7)
Neutrophils Relative %: 75 % (ref 43–77)
Neutrophils Relative %: 59 % (ref 43–77)

## 2010-06-26 LAB — RAPID URINE DRUG SCREEN, HOSP PERFORMED
Amphetamines: NOT DETECTED
Barbiturates: NOT DETECTED
Benzodiazepines: NOT DETECTED
Cocaine: NOT DETECTED
Opiates: POSITIVE — AB
Tetrahydrocannabinol: POSITIVE — AB

## 2010-06-26 LAB — POCT I-STAT, CHEM 8
BUN: 31 mg/dL — ABNORMAL HIGH (ref 6–23)
Calcium, Ion: 1.02 mmol/L — ABNORMAL LOW (ref 1.12–1.32)
Chloride: 109 mEq/L (ref 96–112)
Creatinine, Ser: 1.7 mg/dL — ABNORMAL HIGH (ref 0.4–1.5)
Glucose, Bld: 119 mg/dL — ABNORMAL HIGH (ref 70–99)
HCT: 44 % (ref 39.0–52.0)
Hemoglobin: 15 g/dL (ref 13.0–17.0)
Potassium: 4.3 mEq/L (ref 3.5–5.1)
Sodium: 139 mEq/L (ref 135–145)
TCO2: 24 mmol/L (ref 0–100)

## 2010-06-26 LAB — POCT CARDIAC MARKERS
CKMB, poc: 12.1 ng/mL (ref 1.0–8.0)
Myoglobin, poc: 411 ng/mL (ref 12–200)
Troponin i, poc: <0.05 ng/mL (ref 0.00–0.09)

## 2010-06-26 LAB — URINE MICROSCOPIC-ADD ON

## 2010-06-26 LAB — URINALYSIS, ROUTINE W REFLEX MICROSCOPIC
Hgb urine dipstick: NEGATIVE
Ketones, ur: NEGATIVE mg/dL
Leukocytes, UA: NEGATIVE
Nitrite: NEGATIVE
Protein, ur: 30 mg/dL — AB
Specific Gravity, Urine: 1.021 (ref 1.005–1.030)
Urine Glucose, Fasting: NEGATIVE mg/dL
Urobilinogen, UA: 0.2 mg/dL (ref 0.0–1.0)
pH: 5.5 (ref 5.0–8.0)

## 2010-06-26 LAB — LIPASE, BLOOD: Lipase: 25 U/L (ref 11–59)

## 2010-06-26 LAB — CK TOTAL AND CKMB (NOT AT ARMC)
CK, MB: 12.3 ng/mL (ref 0.3–4.0)
Relative Index: 2.8 — ABNORMAL HIGH (ref 0.0–2.5)
Total CK: 438 U/L — ABNORMAL HIGH (ref 7–232)

## 2010-06-26 LAB — BASIC METABOLIC PANEL
BUN: 24 mg/dL — ABNORMAL HIGH (ref 6–23)
CO2: 24 mEq/L (ref 19–32)
CO2: 24 mEq/L (ref 19–32)
Chloride: 104 mEq/L (ref 96–112)
Chloride: 103 mEq/L (ref 96–112)
GFR calc Af Amer: 57 mL/min — ABNORMAL LOW (ref >60)
Glucose, Bld: 90 mg/dL (ref 70–99)
Potassium: 3.5 mEq/L (ref 3.5–5.1)
Potassium: 3.6 mEq/L (ref 3.5–5.1)

## 2010-06-26 LAB — PROTIME-INR
INR: 0.99 (ref 0.00–1.49)
Prothrombin Time: 13.3 seconds (ref 11.6–15.2)

## 2010-06-26 LAB — CARDIAC PANEL(CRET KIN+CKTOT+MB+TROPI)
Relative Index: 3.4 — ABNORMAL HIGH (ref 0.0–2.5)
Relative Index: 2.6 — ABNORMAL HIGH (ref 0.0–2.5)
Total CK: 380 U/L — ABNORMAL HIGH (ref 7–232)
Troponin I: 0.01 ng/mL (ref 0.00–0.06)
Troponin I: 0.01 ng/mL (ref 0.00–0.06)

## 2010-06-26 LAB — T-HELPER CELLS (CD4) COUNT (NOT AT ARMC): CD4 % Helper T Cell: 35 % (ref 33–55)

## 2010-06-26 LAB — MRSA PCR SCREENING: MRSA by PCR: NEGATIVE

## 2010-06-26 LAB — TROPONIN I: Troponin I: 0.01 ng/mL (ref 0.00–0.06)

## 2010-06-26 LAB — URINE CULTURE: Colony Count: NO GROWTH

## 2010-06-26 LAB — SODIUM, URINE, RANDOM: Sodium, Ur: 47 mEq/L

## 2010-06-26 LAB — CREATININE, URINE, RANDOM: Creatinine, Urine: 209 mg/dL

## 2010-06-28 ENCOUNTER — Encounter (INDEPENDENT_AMBULATORY_CARE_PROVIDER_SITE_OTHER): Payer: Self-pay | Admitting: *Deleted

## 2010-06-30 LAB — CULTURE, BLOOD (ROUTINE X 2): Culture: NO GROWTH

## 2010-07-02 ENCOUNTER — Ambulatory Visit: Admit: 2010-07-02 | Payer: Self-pay | Attending: Internal Medicine | Admitting: Internal Medicine

## 2010-07-05 NOTE — Miscellaneous (Signed)
Summary: Office Visit ([facility])    Vital Signs:  Patient profile:   70 year old male Weight:      182 pounds Temp:     98.0 degrees F oral Pulse rate:   84 / minute Pulse rhythm:   regular Resp:     20 per minute BP sitting:   132 / 84  (left arm)  Vitals Entered By: Sharen Heck RN (July 03, 2009 10:06 AM) CC: f/u 05 Is Patient Diabetic? No Pain Assessment Patient in pain? no       Does patient need assistance? Functional Status Self care Ambulation Normal   CC:  f/u 05.  History of Present Illness: Pt here to get lab results. He is feling well.  No missed doses of his meds.  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 Take 1 Tablet By Mouth Once A Day 2)  Hydrochlorothiazide 25 Mg Tabs (Hydrochlorothiazide) .... Take 1 Tablet By Mouth Once A Day 3)  Protonix 40 Mg Tbec (Pantoprazole Sodium) .... Take 1 Tablet By Mouth Once A Day 4)  Claritin 10 Mg Tabs (Loratadine) .... Take 1 Tablet By Mouth Once A Day 5)  Neurontin 300 Mg Caps (Gabapentin) .... Take 1 Tablet By Mouth Three Times A Day 6)  Naproxen Dr 500 Mg Tbec (Naproxen) .... Take 1 Tablet By Mouth Two Times A Day 7)  Flexeril 10 Mg Tabs (Cyclobenzaprine Hcl) .... Take 1 Tablet By Mouth Every 8 Hours 8)  Atripla 600-200-300 Mg Tabs  (Efavirenz-Emtricitab-Tenofovir) .... Take 1 Tablet By Mouth At Bedtime 9)  Bactroban 2 % Oint (Mupirocin) .... Apply Two Times A Day 10)  Ensure  Liqd (Nutritional Supplements) .... One Can Two Times A Day  Allergies (verified): No Known Drug Allergies   Review of Systems  The patient denies anorexia, fever, and weight loss.     Physical Exam  General:  alert, well-developed, well-nourished, and well-hydrated.   Head:  normocephalic and atraumatic.   Mouth:  pharynx pink and moist.   Lungs:  normal breath sounds.      Impression & Recommendations:  Problem # 1:  HIV DISEASE (ICD-042) Pt.s most recent CD4ct was 459 and VL <48 .  Pt instructed to continue the current antiretroviral regimen.  Pt encouraged to take medication regularly and not miss doses.  Pt will f/u in 3 months for repeat blood work and will see me 2 weeks later.  Diagnostics Reviewed:  CD4: 382 (09/08/2008)   WBC: 3.9 (06/19/2009)   Hgb: 13.2 (06/19/2009)   HCT: 41.3 (06/19/2009)   Platelets: 235 (06/19/2009) HIV-1 RNA: <48 copies/mL (06/19/2009)     Orders: Est. Patient Level III (04540)  Problem # 2:  HYPERTENSION (ICD-401.9)  continue current meds. His updated medication  list for this problem includes:    Vasotec 5 Mg Tabs (Enalapril maleate) .Marland Kitchen... Take 1 tablet by mouth once a day    Hydrochlorothiazide 25 Mg Tabs (Hydrochlorothiazide) .Marland Kitchen... Take 1 tablet by mouth once a day  BP today: 132/84 Prior BP: 131/85 (06/19/2009)  Prior 10 Yr Risk Heart Disease: Not enough information (08/11/2008)  Labs Reviewed: K+: 4.1 (06/19/2009) Creat: : 1.00 (06/19/2009)     Other Orders: Future Orders: T-CD4SP ([facility]) (CD4SP) ... 10/01/2009 T-HIV Viral Load [id number]) ... 10/01/2009 T-Comprehensive Metabolic Panel [phone]) ... 10/01/2009 T-CBC w/Diff ([id number]) ... 10/01/2009 T-RPR (Syphilis) [phone]) ... 10/01/2009 T-Lipid Profile [phone]) ... 10/01/2009    Patient  Instructions: 1)  Please schedule a follow-up appointment in 3 months, 2 weeks after labs.

## 2010-07-05 NOTE — Progress Notes (Signed)
Summary: med refill  Phone Note Refill Request Message from:  Fax from Pharmacy on July 24, 2009 9:33 AM  Refills Requested: Medication #1:  VASOTEC 5 MG TABS Take 1 tablet by mouth once a day   Dosage confirmed as above?Dosage Confirmed   Brand Name Necessary? No   Supply Requested: 1 month  Method Requested: Telephone to Pharmacy Next Appointment Scheduled: seen 07/03/09 Initial call taken by: Sharen Heck RN,  July 24, 2009 9:34 AM    Prescriptions: VASOTEC 5 MG TABS (ENALAPRIL MALEATE) Take 1 tablet by mouth once a day  #30 x 3   Entered by:   Sharen Heck RN   Authorized by:   Yisroel Ramming MD   Signed by:   Yisroel Ramming MD on 07/24/2009   Method used:   Telephoned to ...       [facility]  [address] [phone]* (retail)       [address]       [place], [place]  [address]       Ph: [phone]       Fax: [phone]   RxID:   [id number]

## 2010-07-05 NOTE — Assessment & Plan Note (Signed)
Summary: f/u [mkj]   CC:  follow-up visit, lab results, and c/o body aches.  History of Present Illness: Pt c/o some pain in his neck and hips.  The pain worsens when it rains or gets cold. Pt needs refills.  Preventive Screening-Counseling & Management  Alcohol-Tobacco     Alcohol drinks/day: 0     Smoking Status: quit  Caffeine-Diet-Exercise     Caffeine use/day: occasional     Does Patient Exercise: yes     Type of exercise: gym     Exercise (avg: min/session): 30-60     Times/week: 4  Safety-Violence-Falls     Seat Belt Use: yes      Sexual History:  n/a.     Updated Prior Medication List: VASOTEC 5 MG TABS (ENALAPRIL MALEATE) Take 1 tablet by mouth once a day HYDROCHLOROTHIAZIDE 25 MG TABS (HYDROCHLOROTHIAZIDE) Take 1 tablet by mouth once a day PROTONIX 40 MG TBEC (PANTOPRAZOLE SODIUM) Take 1 tablet by mouth once a day CLARITIN 10 MG TABS (LORATADINE) Take 1 tablet by mouth once a day NEURONTIN 300 MG CAPS (GABAPENTIN) Take 1 tablet by mouth three times a day NAPROXEN DR 500 MG TBEC (NAPROXEN) Take 1 tablet by mouth two times a day FLEXERIL 10 MG TABS (CYCLOBENZAPRINE HCL) Take 1 tablet by mouth every 8 hours ATRIPLA 600-200-300 MG TABS (EFAVIRENZ-EMTRICITAB-TENOFOVIR) Take 1 tablet by mouth at bedtime BACTROBAN 2 % OINT (MUPIROCIN) apply two times a day ENSURE  LIQD (NUTRITIONAL SUPPLEMENTS) one can two times a day VICODIN 5-500 MG TABS (HYDROCODONE-ACETAMINOPHEN) Take 1 tablet by mouth every 8 hours as needed  Current Allergies (reviewed today): No known allergies  Past History:  Past Medical History: Last updated: 10/30/2006 Hepatitis B, hx of Hepatitis C HIV disease Hypertension Osteoarthritis  Social History: Sexual History:  n/a  Review of Systems  The patient denies anorexia, fever, and weight loss.    Vital Signs:  Patient profile:   70 year old male Height:      69 inches (175.26 cm) Weight:      177.4 pounds (80.64 kg) BMI:      26.29 Temp:     98.5 degrees F (36.94 degrees C) oral Pulse rate:   80 / minute BP sitting:   142 / 89  (right arm)  Vitals Entered By: Wendall Mola CMA Duncan Dull) (Oct 12, 2009 10:21 AM) CC: follow-up visit, lab results,c/o body aches Is Patient Diabetic? No Pain Assessment Patient in pain? yes     Location: joint pain Intensity: 7 Type: aching Onset of pain  Intermittent Nutritional Status BMI of 25 - 29 = overweight Nutritional Status Detail appetite "ok"  Does patient need assistance? Functional Status Self care Ambulation Normal Comments no missed doses of HAART med pt. needs several refills   Physical Exam  General:  alert, well-developed, well-nourished, and well-hydrated.   Head:  normocephalic and atraumatic.   Mouth:  pharynx pink and moist.   Lungs:  normal breath sounds.      Impression & Recommendations:  Problem # 1:  HIV DISEASE (ICD-042) Pt.s most recent CD4ct was 320 and VL <48 .  Pt instructed to continue the current antiretroviral regimen.  Pt encouraged to take medication regularly and not miss doses.  Pt will f/u in 3 months for repeat blood work and will see me 2 weeks later.  Diagnostics Reviewed:  CD4: 320 (10/02/2009)   WBC: 3.8 (10/02/2009)   Hgb: 14.5 (10/02/2009)   HCT: 43.2 (10/02/2009)   Platelets: 197 (10/02/2009)  HIV-1 RNA: <48 copies/mL (10/02/2009)     Future Orders: T-Hepatitis B Surface Antigen ([id number]) ... 01/10/2010 T-Hepatitis B Surface Antibody [phone]) ... 01/10/2010  Problem # 2:  OSTEOARTHRITIS (ICD-715.90)  His updated medication list for this problem includes:    Naproxen Dr 500 Mg Tbec (Naproxen) .Marland Kitchen... Take 1 tablet by mouth two times a day    Vicodin 5-500 Mg Tabs (Hydrocodone-acetaminophen) .Marland Kitchen... Take 1 tablet by mouth every 8 hours as needed  Problem # 3:  HYPERTENSION (ICD-401.9) refill meds His updated medication list for this problem includes:    Vasotec 5 Mg Tabs (Enalapril maleate) .Marland Kitchen... Take  1 tablet by mouth once a day    Hydrochlorothiazide 25 Mg Tabs (Hydrochlorothiazide) .Marland Kitchen... Take 1 tablet by mouth once a day  Other Orders: Est. Patient Level III (59563) Future Orders: T-CD4SP ([facility]) (CD4SP) ... 01/10/2010 T-HIV Viral Load [id number]) ... 01/10/2010 T-Comprehensive Metabolic Panel [id number]) ... 01/10/2010 T-CBC w/Diff ([id number]) ... 01/10/2010  Patient Instructions: 1)  Please schedule a follow-up appointment in 3 months, 2 weeks after labs.  Prescriptions: VICODIN 5-500 MG TABS (HYDROCODONE-ACETAMINOPHEN) Take 1 tablet by mouth every 8 hours as needed  #30 x 0   Entered and Authorized by:   Yisroel Ramming MD   Signed by:   Yisroel Ramming MD on 10/12/2009   Method used:   Print then Give to Patient   RxID:   [id number] ATRIPLA 600-200-300 MG TABS (EFAVIRENZ-EMTRICITAB-TENOFOVIR) Take 1 tablet by mouth at bedtime  #30 x 5   Entered and Authorized by:   Yisroel Ramming MD   Signed by:   Yisroel Ramming MD on 10/12/2009   Method used:   Print then Give to Patient   RxID:   [id number] PROTONIX 40 MG TBEC (PANTOPRAZOLE SODIUM) Take 1 tablet by mouth once a day  #30 x 5   Entered and Authorized by:   Yisroel Ramming MD   Signed by:   Yisroel Ramming MD on 10/12/2009   Method used:   Print then Give to Patient   RxID:   [id number] HYDROCHLOROTHIAZIDE 25 MG TABS (HYDROCHLOROTHIAZIDE) Take 1 tablet by mouth once a day  #30 x 5   Entered and Authorized by:   Yisroel Ramming MD   Signed by:   Yisroel Ramming MD on 10/12/2009   Method used:   Print then Give to Patient   RxID:   [id number] VASOTEC 5 MG TABS (ENALAPRIL MALEATE) Take 1 tablet by mouth once a day  #30 x 5   Entered and Authorized by:   Yisroel Ramming MD   Signed by:   Yisroel Ramming MD on 10/12/2009   Method used:   Print then Give to Patient   RxID:   [id number]

## 2010-07-05 NOTE — Progress Notes (Signed)
Summary: med refills  Phone Note Refill Request Message from:  Fax from Pharmacy on June 09, 2009 2:35 PM  Refills Requested: Medication #1:  HYDROCHLOROTHIAZIDE 25 MG TABS Take 1 tablet by mouth once a day   Dosage confirmed as above?Dosage Confirmed   Brand Name Necessary? No   Supply Requested: 1 month   Last Refilled: 05/08/2009  Method Requested: Telephone to Pharmacy Next Appointment Scheduled: 06/19/09. Initial call taken by: Sharen Heck RN,  June 09, 2009 2:36 PM    Prescriptions: HYDROCHLOROTHIAZIDE 25 MG TABS (HYDROCHLOROTHIAZIDE) Take 1 tablet by mouth once a day  #30 x 5   Entered by:   Sharen Heck RN   Authorized by:   Yisroel Ramming MD   Signed by:   Yisroel Ramming MD on 06/09/2009   Method used:   Telephoned to ...       [facility] (retail)       [address] [place], [place]  [address]       Ph: [phone]       Fax: [phone]   RxID:   [id number]

## 2010-07-05 NOTE — Discharge Summary (Signed)
Summary: Hospital Discharge Update    Hospital Discharge Update:  Date of Admission: 04/10/2010 Date of Discharge: 04/11/2010  Brief Summary:  This is 70 year old male with PMH significant for HIV(CD 4 400,undectable viral load) and HTN who presented to the ED with chest pain and shoulder pain. Patient has chronic shoulder pain(shoulder xray showed chronic rotator cuff tear) which  was exacerbated by 2 falls. Patient was admitted for CP rule out. Troponin was negative during entire hospital admission, ECG did not show any significant changes . Patient denied any further episodes of chest pain. He was discharged with OT referral as an outpatient. May consider an MRI of the shoulder if pain persist.  Discharge medications:  HYDROCHLOROTHIAZIDE 25 MG TABS (HYDROCHLOROTHIAZIDE) Take 1 tablet by mouth once a day PROTONIX 40 MG TBEC (PANTOPRAZOLE SODIUM) Take 1 tablet by mouth once a day CLARITIN 10 MG TABS (LORATADINE) Take 1 tablet by mouth once a day NEURONTIN 300 MG CAPS (GABAPENTIN) Take 1 tablet by mouth three times a day NAPROXEN 500 MG TABS (NAPROXEN) Take 1 tablet by mouth two times a day FLEXERIL 10 MG TABS (CYCLOBENZAPRINE HCL) Take 1 tablet by mouth every 8 hours ATRIPLA 600-200-300 MG TABS (EFAVIRENZ-EMTRICITAB-TENOFOVIR) Take 1 tablet by mouth at bedtime BACTROBAN 2 % OINT (MUPIROCIN) apply two times a day ENSURE  LIQD (NUTRITIONAL SUPPLEMENTS) one can two times a day VASOTEC 20 MG TABS (ENALAPRIL MALEATE) Take 1 tablet by mouth once a day ULTRAM 50 MG TABS (TRAMADOL HCL) Take 1 tablet by mouth every 8 hours as needed  Other patient instructions:  You have an appointment with Dr Philipp Deputy on November the 17th at 2:45 PM at the ID clinic at [facility]. If you need to reschedule your appointment please call the office, phone number [phone].  Please follow up with occupational therapy. You will be called for appointment.  Make sure that you take all you medication.     Note: Hospital Discharge Medications & Other Instructions handout was printed, one copy for patient and a second copy to be placed in hospital chart.

## 2010-07-05 NOTE — Miscellaneous (Signed)
Summary: RW Update  Clinical Lists Changes  Observations: Added new observation of RACE: African American (05/08/2010 14:32)

## 2010-07-05 NOTE — Miscellaneous (Signed)
Summary: RW Update  Clinical Lists Changes  Observations: Added new observation of DATE1STVISIT: 12/15/2009 (01/09/2010 13:19) Added new observation of RWPARTICIP: Yes (01/09/2010 13:19)

## 2010-07-05 NOTE — Miscellaneous (Signed)
Summary: Office Visit ([facility])    Vital Signs:  Patient profile:   70 year old male Weight:      173 pounds Temp:     97.9 degrees F oral BP sitting:   118 / 75  (left arm)  Vitals Entered By: Sharen Heck RN (August 31, 2009 2:48 PM) CC: f/u 05 Is Patient Diabetic? No Pain Assessment Patient in pain? yes     Location: shoulders and hips Intensity: 6 Type: aching  Does patient need assistance? Functional Status Self care Ambulation Normal   CC:  f/u 05.  History of Present Illness: Pt states that his arthritis has been getting worse and he needs something stronger for pain some days.  After his hip surgery he was given vicodin which seemed to help.  He would like a Rx for vicdodin.  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 Take 1 Tablet By Mouth Once A Day 2)  Hydrochlorothiazide 25 Mg Tabs (Hydrochlorothiazide) .... Take 1 Tablet By Mouth Once A Day 3)  Protonix 40 Mg Tbec (Pantoprazole Sodium) .... Take 1 Tablet By Mouth Once A Day 4)  Claritin 10 Mg Tabs (Loratadine) .... Take 1 Tablet By Mouth Once A Day 5)  Neurontin 300 Mg Caps (Gabapentin) .... Take 1 Tablet By Mouth Three Times A Day 6)  Naproxen Dr 500 Mg Tbec (Naproxen) .... Take 1 Tablet By Mouth Two Times A Day 7)  Flexeril 10 Mg Tabs  (Cyclobenzaprine Hcl) .... Take 1 Tablet By Mouth Every 8 Hours 8)  Atripla 600-200-300 Mg Tabs (Efavirenz-Emtricitab-Tenofovir) .... Take 1 Tablet By Mouth At Bedtime 9)  Bactroban 2 % Oint (Mupirocin) .... Apply Two Times A Day 10)  Ensure  Liqd (Nutritional Supplements) .... One Can Two Times A Day 11)  Vicodin 5-500 Mg Tabs (Hydrocodone-Acetaminophen) .... Take 1 Tablet By Mouth Every 8 Hours As Needed  Allergies: No Known Drug Allergies   Review of Systems  The patient denies anorexia, fever, and weight loss.     Physical Exam  General:  alert, well-developed, well-nourished, and well-hydrated.   Head:  normocephalic and atraumatic.   Lungs:  normal breath sounds.     Impression & Recommendations:  Problem # 1:  OSTEOARTHRITIS (ICD-715.90) Discussed the need to use vicodin only as needed and not every day. His updated medication list for this problem includes:    Naproxen Dr 500 Mg Tbec (Naproxen) .Marland Kitchen... Take 1 tablet by mouth two times a day    Vicodin 5-500 Mg Tabs (Hydrocodone-acetaminophen) .Marland Kitchen... Take 1 tablet by mouth every 8 hours as needed  Problem # 2:  HIV DISEASE (ICD-042) Pt to continue current meds and f/u next month for labs. Diagnostics Reviewed:  CD4: 382 (09/08/2008)   WBC: 3.9 (  06/19/2009)   Hgb: 13.2 (06/19/2009)   HCT: 41.3 (06/19/2009)   Platelets: 235 (06/19/2009) HIV-1 RNA: <48 copies/mL (06/19/2009)     Medications Added to Medication List This Visit: 1)  Vicodin 5-500 Mg Tabs (Hydrocodone-acetaminophen) .... Take 1 tablet by mouth every 8 hours as needed   Patient Instructions: 1)  Please schedule a follow-up appointment in 1 month. Prescriptions: VICODIN 5-500 MG TABS (HYDROCODONE-ACETAMINOPHEN) Take 1 tablet by mouth every 8 hours as needed  #30 x 0   Entered and Authorized by:   Yisroel Ramming MD   Signed by:   Yisroel Ramming MD on 08/31/2009   Method used:   Print then Give to Patient   RxID:   [id number]

## 2010-07-05 NOTE — Assessment & Plan Note (Signed)
Summary: 2WK F/U/VS   CC:  follow-up visit, lab results, B/P very elevated, requesting a letter to ride transportation [place], and and refill on Vicodin.  History of Present Illness: Pt would like some viagra because of ED.  He stated that his BP was elevated with the nurse at his [facility] checked it.  He has been taking his BP and HIV meds.  Preventive Screening-Counseling & Management  Alcohol-Tobacco     Alcohol drinks/day: 0     Smoking Status: quit  Caffeine-Diet-Exercise     Caffeine use/day: occasional     Does Patient Exercise: yes     Type of exercise: gym     Exercise (avg: min/session): 30-60     Times/week: 4  Safety-Violence-Falls     Seat Belt Use: yes      Sexual History:  n/a.        Drug Use:  never.    Comments: pt. given condoms   Updated Prior Medication List: HYDROCHLOROTHIAZIDE 25 MG TABS (HYDROCHLOROTHIAZIDE) Take 1 tablet by mouth once a day PROTONIX 40 MG TBEC (PANTOPRAZOLE SODIUM) Take 1 tablet by mouth once a day CLARITIN 10 MG TABS (LORATADINE) Take 1 tablet by mouth once a day NEURONTIN 300 MG CAPS (GABAPENTIN) Take 1 tablet by mouth three times a day NAPROXEN 500 MG TABS (NAPROXEN) Take 1 tablet by mouth two times a day FLEXERIL 10 MG TABS (CYCLOBENZAPRINE HCL) Take 1 tablet by mouth every 8 hours ATRIPLA 600-200-300 MG TABS (EFAVIRENZ-EMTRICITAB-TENOFOVIR) Take 1 tablet by mouth at bedtime BACTROBAN 2 % OINT (MUPIROCIN) apply two times a day ENSURE  LIQD (NUTRITIONAL SUPPLEMENTS) one can two times a day VASOTEC 20 MG TABS (ENALAPRIL MALEATE) Take 1 tablet by mouth once a day ULTRAM 50 MG TABS (TRAMADOL HCL) Take 1 tablet by mouth every 8 hours as needed  Current Allergies (reviewed today): No known allergies  Past History:  Past Medical History: Last updated: 10/30/2006 Hepatitis B, hx of Hepatitis C HIV disease Hypertension Osteoarthritis  Social History: Drug Use:  never  Review of Systems  The patient denies anorexia, fever,  and weight loss.    Vital Signs:  Patient profile:   70 year old male Height:      69 inches (175.26 cm) Weight:      174.8 pounds (79.45 kg) BMI:     25.91 Temp:     98.3 degrees F (36.83 degrees C) oral Pulse rate:   63 / minute BP sitting:   182 / 107  (right arm)  Vitals Entered By: Wendall Mola CMA Duncan Dull) (April 04, 2010 9:57 AM) CC: follow-up visit, lab results, B/P very elevated, requesting a letter to ride transportation [place], and refill on Vicodin Is Patient Diabetic? No Pain Assessment Patient in pain? no      Nutritional Status BMI of 25 - 29 = overweight Nutritional Status Detail appetite "up and down"  Have you ever been in a relationship where you felt threatened, hurt or afraid?No   Does patient need assistance? Functional Status Self care Ambulation Normal Comments no missed doses of meds per pt.   Physical Exam  General:  alert, well-developed, well-nourished, and well-hydrated.   Head:  normocephalic and atraumatic.   Mouth:  pharynx pink and moist.   Lungs:  normal breath sounds.     Impression & Recommendations:  Problem # 1:  HIV DISEASE (ICD-042) Pt.s most recent CD4ct was 310 and VL <20 .  Pt instructed to continue the current antiretroviral regimen.  Pt encouraged to take medication regularly and not miss doses.  Pt will f/u in 3 months for repeat blood work and will see me 2 weeks later.  Influenza vaccine and Hep B vaccine given.  Diagnostics Reviewed:  CD4: 310 (03/21/2010)   WBC: 2.7 (03/20/2010)   Hgb: 14.0 (03/20/2010)   HCT: 41.4 (03/20/2010)   Platelets: 184 (03/20/2010) HIV-1 RNA: <20 copies/mL (03/20/2010)   HBSAg: NEG (03/20/2010)  Orders: T-Testosterone; Total [phone])  Problem # 2:  ERECTILE DYSFUNCTION, ORGANIC (ICD-607.84) check testosterone level  Medications Added to Medication List This Visit: 1)  Vasotec 20 Mg Tabs (Enalapril maleate) .... Take 1 tablet by mouth once a day 2)  Ultram 50 Mg Tabs (Tramadol  hcl) .... Take 1 tablet by mouth every 8 hours as needed  Other Orders: Est. Patient Level III (09811) Future Orders: T-CD4SP ([facility]) (CD4SP) ... 07/03/2010 T-HIV Viral Load [phone]) ... 07/03/2010 T-Comprehensive Metabolic Panel [phone]) ... 07/03/2010 T-CBC w/Diff ([id number]) ... 07/03/2010  Patient Instructions: 1)  Please schedule a follow-up appointment in 3 months, 2 weeks after labs.  Prescriptions: ULTRAM 50 MG TABS (TRAMADOL HCL) Take 1 tablet by mouth every 8 hours as needed  #30 x 0   Entered and Authorized by:   Yisroel Ramming MD   Signed by:   Yisroel Ramming MD on 04/04/2010   Method used:   Print then Give to Patient   RxID:   [id number] VASOTEC 20 MG TABS (ENALAPRIL MALEATE) Take 1 tablet by mouth once a day  #30 x 5   Entered and Authorized by:   Yisroel Ramming MD   Signed by:   Yisroel Ramming MD on 04/04/2010   Method used:   Print then Give to Patient   RxID:   [id number]     Appended Document: 2WK F/U/VS    Clinical Lists Changes  Orders: Added new Service order of Influenza Vaccine MCR [phone]) - Signed Added new Service order of Hepatitis B Vaccine >42yrs [phone]) - Signed Added new Service order of Admin 1st Vaccine (60630) - Signed Observations: Added new observation of HEPBVAX#1VIS: 12/18/05 version given April 04, 2010. (04/04/2010 11:17) Added new observation of HEPBVAX#1LOT: 1155AA (04/04/2010 11:17) Added new observation of HEPBVAX#1EXP: 05/19/2012 (04/04/2010 11:17) Added new observation of HEPBVAX#1BY: Wendall Mola CMA ( AAMA) (04/04/2010 11:17) Added new observation of HEPBVAX#1RTE: IM (04/04/2010 11:17) Added new observation of HEPBVAX#1DSE: 0.5 ml (04/04/2010 11:17) Added new observation of HEPBVAX#1MFR: Merck (04/04/2010 11:17) Added new observation of HEPBVAX#1SIT: right deltoid (04/04/2010 11:17) Added new observation of HEPBVAX#1: HepB Adult (04/04/2010 11:17) Added new observation of FLU VAX#1VIS:  12/26/09 version given April 04, 2010. (04/04/2010 11:17) Added new observation of FLU VAXLOT: 1103 3P (04/04/2010 11:17) Added new observation of FLU VAX EXP: 09/02/2010 (04/04/2010 11:17) Added new observation of FLU VAXBY: Wendall Mola CMA ( AAMA) (04/04/2010 11:17) Added new observation of FLU VAXRTE: IM (04/04/2010 11:17) Added new observation of FLU VAX DSE: 0.5 ml (04/04/2010 11:17) Added new observation of FLU VAXMFR: Novartis (04/04/2010 11:17) Added new observation of FLU VAX SITE: left deltoid (04/04/2010 11:17) Added new observation of FLU VAX: Fluvax MCR (04/04/2010 11:17)       Immunizations Administered:  Influenza Vaccine # 1:    Vaccine Type: Fluvax MCR    Site: left deltoid    Mfr: Novartis    Dose: 0.5 ml    Route: IM    Given by: Wendall Mola CMA ( AAMA)    Exp. Date: 09/02/2010    Lot #:  [id number]    VIS given: 12/26/09 version given April 04, 2010.  Hepatitis B Vaccine # 1:    Vaccine Type: HepB Adult    Site: right deltoid    Mfr: Merck    Dose: 0.5 ml    Route: IM    Given by: Wendall Mola CMA ( AAMA)    Exp. Date: 05/19/2012    Lot #: [id number]    VIS given: 12/18/05 version given April 04, 2010.  Flu Vaccine Consent Questions:    Do you have a history of severe allergic reactions to this vaccine? no    Any prior history of allergic reactions to egg and/or gelatin? no    Do you have a sensitivity to the preservative Thimersol? no    Do you have a past history of Guillan-Barre Syndrome? no    Do you currently have an acute febrile illness? no    Have you ever had a severe reaction to latex? no    Vaccine information given and explained to patient? yes

## 2010-07-05 NOTE — Assessment & Plan Note (Signed)
Summary: ER FROM W/L [[facility]]   CC:  ER followup from [facility] for pneumonia 12/02/09.  History of Present Illness: Pt had Right sided flank pain 7/2 and went to the ED. He had a CT scan done and was noted to have early RLL pneumonia.  He was given Bactrim which he has been taking.  He feels better now.  Preventive Screening-Counseling & Management  Alcohol-Tobacco     Alcohol drinks/day: 0     Smoking Status: quit  Caffeine-Diet-Exercise     Caffeine use/day: occasional     Does Patient Exercise: yes     Type of exercise: gym     Exercise (avg: min/session): 30-60     Times/week: 4  Safety-Violence-Falls     Seat Belt Use: yes      Sexual History:  n/a.    Comments: pt. given condoms   Updated Prior Medication List: VASOTEC 5 MG TABS (ENALAPRIL MALEATE) Take 1 tablet by mouth once a day HYDROCHLOROTHIAZIDE 25 MG TABS (HYDROCHLOROTHIAZIDE) Take 1 tablet by mouth once a day PROTONIX 40 MG TBEC (PANTOPRAZOLE SODIUM) Take 1 tablet by mouth once a day CLARITIN 10 MG TABS (LORATADINE) Take 1 tablet by mouth once a day NEURONTIN 300 MG CAPS (GABAPENTIN) Take 1 tablet by mouth three times a day NAPROXEN 500 MG TABS (NAPROXEN) Take 1 tablet by mouth two times a day FLEXERIL 10 MG TABS (CYCLOBENZAPRINE HCL) Take 1 tablet by mouth every 8 hours ATRIPLA 600-200-300 MG TABS (EFAVIRENZ-EMTRICITAB-TENOFOVIR) Take 1 tablet by mouth at bedtime BACTROBAN 2 % OINT (MUPIROCIN) apply two times a day ENSURE  LIQD (NUTRITIONAL SUPPLEMENTS) one can two times a day VICODIN 5-500 MG TABS (HYDROCODONE-ACETAMINOPHEN) Take 1 tablet by mouth every 8 hours as needed BACTRIM DS 800-160 MG TABS (SULFAMETHOXAZOLE-TRIMETHOPRIM) take one tablet by mouth two times a day for 14 days  Current Allergies (reviewed today): No known allergies  Past History:  Past Medical History: Last updated: 10/30/2006 Hepatitis B, hx of Hepatitis C HIV disease Hypertension Osteoarthritis  Review of Systems  The patient  denies anorexia, fever, weight loss, prolonged cough, and hemoptysis.    Vital Signs:  Patient profile:   70 year old male Height:      69 inches (175.26 cm) Weight:      171.12 pounds (77.78 kg) BMI:     25.36 Temp:     98.3 degrees F (36.83 degrees C) oral Pulse rate:   79 / minute BP sitting:   120 / 83  (left arm)  Vitals Entered By: Wendall Mola CMA Duncan Dull) (December 15, 2009 2:35 PM) CC: ER followup from [facility] for pneumonia 12/02/09 Is Patient Diabetic? No Pain Assessment Patient in pain? yes     Location: left hip Intensity: 5 Type: dull Onset of pain  Constant Nutritional Status BMI of 25 - 29 = overweight Nutritional Status Detail appetite "ok"  Does patient need assistance? Functional Status Self care Ambulation Normal Comments no missed doses of meds per patient   Physical Exam  General:  alert, well-developed, well-nourished, and well-hydrated.   Head:  normocephalic and atraumatic.   Mouth:  pharynx pink and moist.   Lungs:  normal breath sounds.     Impression & Recommendations:  Problem # 1:  Hx of BACTERIAL PNEUMONIA, RIGHT LOWER LOBE (ICD-482.9) improved finish Bactrim His updated medication list for this problem includes:    Bactrim Ds 800-160 Mg Tabs (Sulfamethoxazole-trimethoprim) .Marland Kitchen... Take one tablet by mouth two times a day for 14 days  Orders:  Est. Patient Level III (16109)  Medications Added to Medication List This Visit: 1)  Bactrim Ds 800-160 Mg Tabs (Sulfamethoxazole-trimethoprim) .... Take one tablet by mouth two times a day for 14 days Prescriptions: HYDROCHLOROTHIAZIDE 25 MG TABS (HYDROCHLOROTHIAZIDE) Take 1 tablet by mouth once a day  #30 x 5   Entered and Authorized by:   Yisroel Ramming MD   Signed by:   Yisroel Ramming MD on 12/15/2009   Method used:   Print then Give to Patient   RxID:   [id number] VASOTEC 5 MG TABS (ENALAPRIL MALEATE) Take 1 tablet by mouth once a day  #30 x 5   Entered and Authorized by:   Yisroel Ramming MD   Signed by:   Yisroel Ramming MD on 12/15/2009   Method used:   Print then Give to Patient   RxID:   [id number]

## 2010-07-05 NOTE — Progress Notes (Signed)
Summary: Pain med refill request/mld  Phone Note Refill Request Message from:  Fax from Pharmacy on November 06, 2009 10:42 AM  Refills Requested: Medication #1:  VICODIN 5-500 MG TABS Take 1 tablet by mouth every 8 hours as needed.   Last Refilled: 10/12/2009 Received fax refill request from [facility] [address]   Method Requested: Telephone to Pharmacy Next Appointment Scheduled: last appt 10/12/09 Initial call taken by: Paulo Fruit  BS,CPht II,MPH,  November 06, 2009 10:43 AM  Follow-up for Phone Call        pt needs to come in and sign a pain contract Follow-up by: Yisroel Ramming MD,  November 06, 2009 2:43 PM  Additional Follow-up for Phone Call Additional follow up Details #1::        Pharmacy notified-- denied at the moment.    Prescriptions: VICODIN 5-500 MG TABS (HYDROCODONE-ACETAMINOPHEN) Take 1 tablet by mouth every 8 hours as needed  #0 x 0   Entered by:   Paulo Fruit  BS,CPht II,MPH   Authorized by:   Yisroel Ramming MD   Signed by:   Paulo Fruit  BS,CPht II,MPH on 11/07/2009   Method used:   Electronically to        [facility] [address] [phone]* (retail)       [address]       [place], [place]  [address]       Ph: [phone]       Fax: [phone]   RxID:   [id number]  Paulo Fruit  BS,CPht II,MPH  November 07, 2009 10:28 AM

## 2010-07-05 NOTE — Miscellaneous (Signed)
Summary: Office Visit ([facility] 05)    Vital Signs:  Patient profile:   70 year old male Height:      69 inches (175.26 cm) Weight:      180.04 pounds (81.84 kg) BMI:     26.68 Temp:     98.2 degrees F (36.78 degrees C) oral Pulse rate:   76 / minute Resp:     12 per minute BP sitting:   131 / 85  (left arm) Cuff size:   large  Vitals Entered By: Hoy Morn CMA (June 19, 2009 9:34 AM) CC: pt presents fu 05//ckf Is Patient Diabetic? No Pain Assessment Patient in pain? no      Nutritional Status BMI of 25 - 29 = overweight  Does patient need assistance? Functional Status Self care Ambulation Normal   CC:  pt presents fu 05//ckf.  History of Present Illness: Pt s/p hip replacement 11/29.  He is feeling better and is in PT. He is walking without a cane.  He is very thanful. He has been taking his Atripla regularly.  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     Physical Exam  General:  alert, well-developed, well-nourished, and well-hydrated.   Head:  normocephalic and atraumatic.   Mouth:  pharynx pink and moist.   Lungs:  normal breath sounds.     Impression & Recommendations:  Problem # 1:  HIV DISEASE (ICD-042) Will obtain labs today and have pt f/u  in 2 weeks. Pt to continue his Atripla. Orders: T-CBC w/Diff [phone]) T-CD4SP ([facility]) (CD4SP) T-Comprehensive Metabolic Panel [phone]) T-HIV Viral Load [id number]) Est. Patient Level III (57846)  Problem # 2:  OSTEOARTHRITIS (ICD-715.90) s/p hip replacement - doing well. His updated medication list for this problem includes:    Naproxen Dr 500 Mg Tbec (Naproxen) .Marland Kitchen... Take 1 tablet by mouth two times a day  Complete Medication List: 1)  Vasotec 5 Mg Tabs (Enalapril maleate) .... Take 1 tablet by mouth once a day 2)  Hydrochlorothiazide 25 Mg Tabs (Hydrochlorothiazide) .... Take 1 tablet by mouth once a day 3)  Protonix 40 Mg Tbec (Pantoprazole sodium) .... Take 1 tablet by mouth once a day 4)  Claritin 10 Mg Tabs (Loratadine) .... Take 1 tablet by mouth once a day 5)  Neurontin 300 Mg Caps (Gabapentin) .... Take 1 tablet by mouth three times a day 6)  Naproxen Dr 500 Mg Tbec (Naproxen) .... Take 1 tablet by mouth two times a day 7)  Flexeril 10 Mg Tabs (Cyclobenzaprine hcl) .... Take 1 tablet by mouth every 8 hours 8)  Atripla 600-200-300 Mg Tabs (Efavirenz-emtricitab-tenofovir) .... Take 1 tablet by mouth at bedtime 9)  Bactroban 2 % Oint (Mupirocin) .Marland KitchenMarland KitchenMarland Kitchen  Apply two times a day 10)  Ensure Liqd (Nutritional supplements) .... One can two times a day   Patient Instructions: 1)  Please schedule a follow-up appointment in 2 weeks.
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
## 2010-07-05 NOTE — Progress Notes (Signed)
Summary: refill request  Phone Note Refill Request Message from:  Fax from Pharmacy on November 27, 2009 2:45 PM  Refills Requested: Medication #1:  NAPROXEN DR 500 MG TBEC Take 1 tablet by mouth two times a day   Last Refilled: 10/20/2009 [facility] [address]   Method Requested: Fax to Local Pharmacy Next Appointment Scheduled: none scheduled Initial call taken by: Paulo Fruit  BS,CPht II,MPH,  November 27, 2009 2:46 PM  Follow-up for Phone Call        ok x 6 Follow-up by: Yisroel Ramming MD,  November 27, 2009 2:47 PM    Prescriptions: NAPROXEN DR 500 MG TBEC (NAPROXEN) Take 1 tablet by mouth two times a day  #60 x 6   Entered by:   Paulo Fruit  BS,CPht II,MPH   Authorized by:   Yisroel Ramming MD   Signed by:   Paulo Fruit  BS,CPht II,MPH on 11/27/2009   Method used:   Electronically to        [facility] [address] [phone]* (retail)       [address]       [place], [place]  [address]       Ph: [phone]       Fax: [phone]   RxID:   [id number]  Paulo Fruit  BS,CPht II,MPH  November 27, 2009 4:01 PM  Appended Document: refill request pt. notified needed to sign pain contract before receiving Vicodin refill  Appended Document: refill request Pt. came in today and signed pain contract to receive his RX for Vicodin

## 2010-07-05 NOTE — Miscellaneous (Signed)
Summary: HIPAA Restrictions  HIPAA Restrictions   Imported By: Florinda Marker 10/03/2009 15:26:09  _____________________________________________________________________  External Attachment:    Type:   Image     Comment:   External Document

## 2010-07-05 NOTE — Progress Notes (Signed)
Summary: call placed to patient  Phone Note Outgoing Call   Call placed by: Paulo Fruit  BS,CPht II,MPH,  November 07, 2009 10:29 AM Call placed to: Patient Details for Reason: need patient to come in Summary of Call: Tried to contact the patient but number on file is disconnected or no longer in service. Unable to reach patient at this time. I hoping the pharmacy will let patient know he needs to contact our office.  A note was told to the pharmacy when medication was denied. Initial call taken by: Paulo Fruit  BS,CPht II,MPH,  November 07, 2009 10:29 AM

## 2010-07-05 NOTE — Assessment & Plan Note (Signed)
Summary: Hospital Admission 04/10/10  INTERNAL MEDICINE ADMISSION HISTORY AND PHYSICAL PCP:Dr. Drue Second EA:[id number] pain HPI: 70 years old with history of HIV (CD4 459), undetectable viral load, bilateral hip replacement, HTN presented with complains of right sided chest pain and shoulder pain. His chronic shoulder pain was exaceberated by fall 2 days ago. He tripped over while walking and hurt himself. He presented to ED with these compalins and was evaluted further with cardiac enzymes and other routine labwork.   His CK and CKMB were elevated and troponin were normal. He is being admitted for CP rule out due to his risk factors- (Age, HTN, HLD) elevated enzymes. He has no other compalins.  ALLERGIES: NKDA  PAST MEDICAL HISTORY: Past Medical History:  Hepatitis B, hx of Hepatitis C HIV disease Hypertension Osteoarthritis Hip replacement   MEDICATIONS: Current Meds:  HYDROCHLOROTHIAZIDE 25 MG TABS (HYDROCHLOROTHIAZIDE) Take 1 tablet by mouth once a day PROTONIX 40 MG TBEC (PANTOPRAZOLE SODIUM) Take 1 tablet by mouth once a day CLARITIN 10 MG TABS (LORATADINE) Take 1 tablet by mouth once a day NEURONTIN 300 MG CAPS (GABAPENTIN) Take 1 tablet by mouth three times a day NAPROXEN 500 MG TABS (NAPROXEN) Take 1 tablet by mouth two times a day FLEXERIL 10 MG TABS (CYCLOBENZAPRINE HCL) Take 1 tablet by mouth every 8 hours ATRIPLA 600-200-300 MG TABS (EFAVIRENZ-EMTRICITAB-TENOFOVIR) Take 1 tablet by mouth at bedtime BACTROBAN 2 % OINT (MUPIROCIN) apply two times a day ENSURE  LIQD (NUTRITIONAL SUPPLEMENTS) one can two times a day VASOTEC 20 MG TABS (ENALAPRIL MALEATE) Take 1 tablet by mouth once a day ULTRAM 50 MG TABS (TRAMADOL HCL) Take 1 tablet by mouth every 8 hours as needed   SOCIAL HISTORY: Lives with a room mate. Denies alcohol or tobacco use  Insurance:[employer]  FAMILY HISTORY Family History:   Father died in an accidental fall in his late 2s Mother died of metastatic cancer  (unknown primary) in her 25s One sister lives in [place] who has seizures Another sister has blindness A brother died some years ago.  ROS: per HPI  VITALS: T:98.9  P:69  BP:132/89  R:16  O2SAT:93%  ON:RA  PHYSICAL EXAM: General:  alert, well-developed, and cooperative to examination.   Head:  normocephalic and atraumatic.   Eyes:  vision grossly intact, pupils equal, pupils round, pupils reactive to light, no injection and anicteric.   Mouth:  pharynx pink and moist, no erythema, and no exudates.   Neck:  supple, full ROM, no thyromegaly, no JVD, and no carotid bruits.   Lungs:  normal respiratory effort, no accessory muscle use, normal breath sounds, no crackles, and no wheezes.  Heart:  normal rate, regular rhythm, no murmur, no gallop, and no rub.   Abdomen:  soft, non-tender, normal bowel sounds, no distention, no guarding, no rebound tenderness, no hepatomegaly, and no splenomegaly.   Msk:  no joint swelling, no joint warmth, and no redness over joints.   Pulses:  2+ DP/PT pulses bilaterally Extremities:  No cyanosis, clubbing, edema  Neurologic:  alert & oriented X3, cranial nerves II-XII intact, strength normal in all extremities, sensation intact to light touch, and gait normal.   Skin:  turgor normal and no rashes.   Psych:  Oriented X3, memory intact for recent and remote, normally interactive, good eye contact, not anxious appearing, and not depressed appearing.  LABS:  WBC  3.4        l      4.0-10.5         K/uL  RBC                                      4.46              4.22-5.81        MIL/uL  Hemoglobin (HGB)                         12.9       l      13.0-17.0        g/dL  Hematocrit (HCT)                         39.3              39.0-52.0        %  MCV                                      88.1              78.0-100.0       fL  MCH -                                    28.9              26.0-34.0        pg  MCHC                                      32.8              30.0-36.0        g/dL  RDW                                      13.9              11.5-15.5        %  Platelet Count (PLT)                     170               150-400          K/uL  Neutrophils, %                           36         l      43-77            %  Lymphocytes, %                           41                12-46            %  Monocytes, %  18         h      3-12             %  Eosinophils, %                           5                 0-5              %  Basophils, %                             1                 0-1              %  Neutrophils, Absolute                    1.2        l      1.7-7.7          K/uL  Lymphocytes, Absolute                    1.4               0.7-4.0          K/uL  Monocytes, Absolute                      0.6               0.1-1.0          K/uL  Eosinophils, Absolute                    0.2               0.0-0.7          K/uL  Basophils, Absolute                      0.0               0.0-0.1          K/uL  Sodium (NA)                              136               135-145          mEq/L  Potassium (K)                            3.2        l      3.5-5.1          mEq/L  Chloride                                 104               96-112           mEq/L  CO2  27                19-32            mEq/L  Glucose                                  120        h      70-99            mg/dL  BUN                                      18                6-23             mg/dL  Creatinine                               1.12              0.4-1.5          mg/dL  GFR, Est Non African American            >60               >60              mL/min  GFR, Est African American                >60               >60              mL/min    Oversized comment, see footnote  1  Calcium                                  8.8               8.4-10.5         mg/dL  Creatine Kinase, Total                    736        h      7-232            U/L  CK, MB                                   14.0       H      0.3-4.0          ng/mL  Relative Index                           1.9               0.0-2.5  Troponin I                               0.01              0.00-0.06        ng/mL     UA  and Urine micro are normal Shoulder xray   IMPRESSION:    1.  Glenohumeral and AC joint osteoarthritis without acute osseous   abnormality.   2.  High-riding humeral head consistent with chronic rotator cuff   tear.  CXR   IMPRESSION:   No active cardiopulmonary disease.  No displaced rib fracture or   pneumothorax.  C Spine   IMPRESSION:   Severe multilevel cervical spondylosis.  No cervical spine fracture   or dislocation.  MYOVIEW 2004   IMPRESSION    No evidence of ischemia.  Probable diaphragmatic attenuation of   inferior wall.  ASSESSMENT AND PLAN: 1. CP rule out: unlikely of cardiac origin. Non specific pain and elevation of CE from fall and muscle injury. CE X 3 q8 h, hydration, EKG and TELE monitoring. Repeat FLP. Aspirin. Continue antihypertensives  2. Fall/mild rhabdo hydration and monitor renal function  3 HTN- continue HCTZ, as needed labetelol  4. HIV- continue atripla, recent labwork for cd4 and viral load, no need to repeat  5. GERD- continue protonix  6. Continue home medications ()VTE PROPH: SCDs  7. Hypokalemia- replete as needed.

## 2010-07-05 NOTE — Progress Notes (Signed)
Summary: Form from [employer] Note Other Incoming   Caller: Fax from [employer] of Call: Received a fax from [employer] needing more information for a visit on 08/31/09.  Pt. was seen at [facility] on this date.  Faxed form to [facility] at [phone]. Initial call taken by: Wendall Mola CMA Duncan Dull),  January 10, 2010 9:48 AM
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
## 2010-07-05 NOTE — Miscellaneous (Signed)
  Clinical Lists Changes  Observations: Added new observation of INCOMESOURCE: UNKNOWN (06/28/2010 12:13) Added new observation of HOUSEINCOME: 0  (06/28/2010 12:13) Added new observation of #CHILD<18 IN: Unknown  (06/28/2010 12:13) Added new observation of FAMILYSIZE: 0  (06/28/2010 12:13) Added new observation of HOUSING: Unknown  (06/28/2010 12:13) Added new observation of YEARLYEXPEN: 0  (06/28/2010 12:13) Added new observation of MARITAL STAT: Unknown  (06/28/2010 12:13) Added new observation of LATINO/HISP: No  (06/28/2010 12:13)

## 2010-07-06 NOTE — Miscellaneous (Signed)
Summary: Medication Contract  Medication Contract   Imported By: Florinda Marker 12/01/2009 11:37:44  _____________________________________________________________________  External Attachment:    Type:   Image     Comment:   External Document

## 2010-07-09 ENCOUNTER — Other Ambulatory Visit: Payer: Self-pay | Admitting: Adult Health

## 2010-07-09 ENCOUNTER — Ambulatory Visit (INDEPENDENT_AMBULATORY_CARE_PROVIDER_SITE_OTHER): Payer: PRIVATE HEALTH INSURANCE | Admitting: Adult Health

## 2010-07-09 ENCOUNTER — Encounter: Payer: Self-pay | Admitting: Adult Health

## 2010-07-09 DIAGNOSIS — B9789 Other viral agents as the cause of diseases classified elsewhere: Secondary | ICD-10-CM

## 2010-07-09 DIAGNOSIS — M67919 Unspecified disorder of synovium and tendon, unspecified shoulder: Secondary | ICD-10-CM | POA: Insufficient documentation

## 2010-07-09 DIAGNOSIS — M719 Bursopathy, unspecified: Secondary | ICD-10-CM

## 2010-07-09 DIAGNOSIS — B20 Human immunodeficiency virus [HIV] disease: Secondary | ICD-10-CM

## 2010-07-09 DIAGNOSIS — N189 Chronic kidney disease, unspecified: Secondary | ICD-10-CM

## 2010-07-09 DIAGNOSIS — Z23 Encounter for immunization: Secondary | ICD-10-CM

## 2010-07-11 LAB — CONVERTED CEMR LAB
Albumin: 3.7 g/dL (ref 3.5–5.2)
Alkaline Phosphatase: 93 units/L (ref 39–117)
BUN: 23 mg/dL (ref 6–23)
Calcium: 8.9 mg/dL (ref 8.4–10.5)
Chloride: 103 meq/L (ref 96–112)
Glucose, Bld: 125 mg/dL — ABNORMAL HIGH (ref 70–99)
Potassium: 4.1 meq/L (ref 3.5–5.3)
Sodium: 139 meq/L (ref 135–145)
Total Protein: 6.8 g/dL (ref 6.0–8.3)

## 2010-07-11 NOTE — Discharge Summary (Signed)
Summary: Hospital Discharge Update    Hospital Discharge Update:  Date of Admission: 06/23/2010 Date of Discharge: 06/25/2010  Brief Summary:  Patient is a 70 year old man with a PMH significant for Hep B, Hep C, HIV with last CD4 count of 400 and undetectable viral load, HTN, and OA who presented to the [facility] ED with a chief complaint of not feeling well.  He reported chills, and subjective fevers, diaphoresis, nausea, and vomiting for one day.  He states he has exposure to "people coughing."  In the ED a chest x-ray was suscpicious for a right lower lobe pneumonia so he was admitted and started on Clindamycin and avelox to cover CAP as well as aspiration pneumonia.  His subsequent worked up showed elevated WBCs and a repeat chest x-ray showed the suscpicious consolidation had vanished.  He remained afebrile throughout admission.  His cough and SOB was thought to be secondary to a viral URI and was treated with Mucinex and Clindamycin was discontinued.  He will be discharged and told to finish off an additional 3 days of Avelox.  He did not have pneumonia. He will follow up with Dr. Philipp Deputy, his PCP, 07/02/10 at 3:45 pm.   On admission he had a slightly elevated Creatinine above his baseline that resolved within 12 hours with IV fluid hydration.   He should have a Bmet done on his follow up to follow his creatinine.  On admission he also was noted to have some low blood pressures.  HIs home antihypertensives were held and he was fluid rehydrated.  The day after admission his blood pressure had resolved and he was restarted on his Enalipril at his home dose.  His HCTZ was held on discharge but should be evaluated as an outpatient to see if he needs it added back for blood pressure control.  Labs needed at follow-up: Basic metabolic panel  Problem list changes:  Removed problem of OPEN WOUND NOSE UNSPEC SITE WITHOUT MENTION COMP (ICD-873.20) Added new problem of URI (ICD-465.9)  Medication list  changes:  Removed medication of HYDROCHLOROTHIAZIDE 25 MG TABS (HYDROCHLOROTHIAZIDE) Take 1 tablet by mouth once a day Added new medication of AVELOX 400 MG TABS (MOXIFLOXACIN HCL) Take 1 tablet by mouth once a day for 3 days. - Signed Rx of AVELOX 400 MG TABS (MOXIFLOXACIN HCL) Take 1 tablet by mouth once a day for 3 days.;  #3 x 0;  Signed;  Entered by: Leodis Sias MD;  Authorized by: Leodis Sias MD;  Method used: Electronically to [facility] [address] [id number]*, [address], [place], [place]  [address], Ph: [phone], Fax: [phone]  The medication, problem, and allergy lists have been updated.  Please see the dictated discharge summary for details.  Discharge medications:  PROTONIX 40 MG TBEC (PANTOPRAZOLE SODIUM) Take 1 tablet by mouth once a day CLARITIN 10 MG TABS (LORATADINE) Take 1 tablet by mouth once a day NEURONTIN 300 MG CAPS (GABAPENTIN) Take 1 tablet by mouth three times a day NAPROXEN 500 MG TABS (NAPROXEN) Take 1 tablet by mouth two times a day FLEXERIL 10 MG TABS (CYCLOBENZAPRINE HCL) Take 1 tablet by mouth every 8 hours ATRIPLA 600-200-300 MG TABS (EFAVIRENZ-EMTRICITAB-TENOFOVIR) Take 1 tablet by mouth at bedtime BACTROBAN 2 % OINT (MUPIROCIN) apply two times a day ENSURE  LIQD (NUTRITIONAL SUPPLEMENTS) one can two times a day VASOTEC 20 MG TABS (ENALAPRIL MALEATE) Take 1 tablet by mouth once a day ULTRAM 50 MG TABS (TRAMADOL HCL) Take 1 tablet by mouth  every 8 hours as needed VIAGRA 50 MG TABS (SILDENAFIL CITRATE) use as directed AVELOX 400 MG TABS (MOXIFLOXACIN HCL) Take 1 tablet by mouth once a day for 3 days.  Other patient instructions:  Start AVELOX 400 mg tablets 1 tablet daily for 3 days.  STOP HYDROCHLOROTHIAZIDE until your follow up appointment.  Continue all your other medications as prescribed.   You have a follow up appointment with Dr. Philipp Deputy on 07/02/10 at 3:45 pm.    Note: Hospital Discharge Medications & Other Instructions handout  was printed, one copy for patient and a second copy to be placed in hospital chart.

## 2010-08-10 ENCOUNTER — Emergency Department (HOSPITAL_COMMUNITY)
Admission: EM | Admit: 2010-08-10 | Discharge: 2010-08-10 | Disposition: A | Discharge status: Home / Self Care | Payer: PRIVATE HEALTH INSURANCE | Attending: Emergency Medicine | Admitting: Emergency Medicine

## 2010-08-10 DIAGNOSIS — R634 Abnormal weight loss: Secondary | ICD-10-CM | POA: Insufficient documentation

## 2010-08-10 DIAGNOSIS — G8929 Other chronic pain: Secondary | ICD-10-CM | POA: Insufficient documentation

## 2010-08-10 DIAGNOSIS — I10 Essential (primary) hypertension: Secondary | ICD-10-CM | POA: Insufficient documentation

## 2010-08-10 DIAGNOSIS — Z21 Asymptomatic human immunodeficiency virus [HIV] infection status: Secondary | ICD-10-CM | POA: Insufficient documentation

## 2010-08-10 DIAGNOSIS — M810 Age-related osteoporosis without current pathological fracture: Secondary | ICD-10-CM | POA: Insufficient documentation

## 2010-08-10 DIAGNOSIS — Z8619 Personal history of other infectious and parasitic diseases: Secondary | ICD-10-CM | POA: Insufficient documentation

## 2010-08-10 DIAGNOSIS — M129 Arthropathy, unspecified: Secondary | ICD-10-CM | POA: Insufficient documentation

## 2010-08-10 DIAGNOSIS — R630 Anorexia: Secondary | ICD-10-CM | POA: Insufficient documentation

## 2010-08-10 DIAGNOSIS — K921 Melena: Secondary | ICD-10-CM | POA: Insufficient documentation

## 2010-08-10 DIAGNOSIS — R197 Diarrhea, unspecified: Secondary | ICD-10-CM | POA: Insufficient documentation

## 2010-08-10 DIAGNOSIS — Z79899 Other long term (current) drug therapy: Secondary | ICD-10-CM | POA: Insufficient documentation

## 2010-08-10 LAB — URINE MICROSCOPIC-ADD ON

## 2010-08-10 LAB — URINALYSIS, ROUTINE W REFLEX MICROSCOPIC
Bilirubin Urine: NEGATIVE
Glucose, UA: NEGATIVE mg/dL
Hgb urine dipstick: NEGATIVE
Ketones, ur: NEGATIVE mg/dL
Leukocytes, UA: NEGATIVE
Nitrite: NEGATIVE
Protein, ur: 100 mg/dL — AB
Specific Gravity, Urine: 1.033 — ABNORMAL HIGH (ref 1.005–1.030)
Urobilinogen, UA: 0.2 mg/dL (ref 0.0–1.0)
pH: 5.5 (ref 5.0–8.0)

## 2010-08-10 LAB — OCCULT BLOOD, POC DEVICE: Fecal Occult Bld: NEGATIVE

## 2010-08-10 LAB — DIFFERENTIAL
Lymphocytes Relative: 41 % (ref 12–46)
Lymphs Abs: 1.9 10*3/uL (ref 0.7–4.0)
Monocytes Absolute: 0.7 10*3/uL (ref 0.1–1.0)
Monocytes Relative: 14 % — ABNORMAL HIGH (ref 3–12)
Neutro Abs: 2 10*3/uL (ref 1.7–7.7)

## 2010-08-10 LAB — BASIC METABOLIC PANEL
CO2: 24 mEq/L (ref 19–32)
Calcium: 8.8 mg/dL (ref 8.4–10.5)
Creatinine, Ser: 1.11 mg/dL (ref 0.4–1.5)
Glucose, Bld: 97 mg/dL (ref 70–99)
Sodium: 138 mEq/L (ref 135–145)

## 2010-08-10 LAB — CBC
HCT: 38.6 % — ABNORMAL LOW (ref 39.0–52.0)
Hemoglobin: 12.9 g/dL — ABNORMAL LOW (ref 13.0–17.0)
MCH: 29.2 pg (ref 26.0–34.0)
MCHC: 33.4 g/dL (ref 30.0–36.0)

## 2010-08-15 ENCOUNTER — Encounter: Payer: Self-pay | Admitting: Adult Health

## 2010-08-15 LAB — BASIC METABOLIC PANEL
BUN: 14 mg/dL (ref 6–23)
BUN: 16 mg/dL (ref 6–23)
BUN: 18 mg/dL (ref 6–23)
Calcium: 8.5 mg/dL (ref 8.4–10.5)
Calcium: 9.1 mg/dL (ref 8.4–10.5)
Chloride: 104 mEq/L (ref 96–112)
Creatinine, Ser: 1.03 mg/dL (ref 0.4–1.5)
GFR calc non Af Amer: >60 mL/min (ref >60)
GFR calc non Af Amer: >60 mL/min (ref >60)
Glucose, Bld: 95 mg/dL (ref 70–99)
Potassium: 3.2 mEq/L — ABNORMAL LOW (ref 3.5–5.1)
Potassium: 4 mEq/L (ref 3.5–5.1)
Potassium: 4.6 mEq/L (ref 3.5–5.1)
Sodium: 136 mEq/L (ref 135–145)

## 2010-08-15 LAB — LIPID PANEL
Cholesterol: 158 mg/dL (ref 0–200)
HDL: 56 mg/dL (ref >39)
LDL Cholesterol: 90 mg/dL (ref 0–99)
Total CHOL/HDL Ratio: 2.8 RATIO
VLDL: 12 mg/dL (ref 0–40)

## 2010-08-15 LAB — DIFFERENTIAL
Eosinophils Relative: 5 % (ref 0–5)
Lymphocytes Relative: 41 % (ref 12–46)
Lymphs Abs: 1.4 10*3/uL (ref 0.7–4.0)
Monocytes Absolute: 0.6 10*3/uL (ref 0.1–1.0)
Monocytes Relative: 18 % — ABNORMAL HIGH (ref 3–12)

## 2010-08-15 LAB — URINE MICROSCOPIC-ADD ON

## 2010-08-15 LAB — POCT CARDIAC MARKERS
CKMB, poc: 8.9 ng/mL (ref 1.0–8.0)
Myoglobin, poc: 102 ng/mL (ref 12–200)
Troponin i, poc: <0.05 ng/mL (ref 0.00–0.09)

## 2010-08-15 LAB — TROPONIN I: Troponin I: 0.01 ng/mL (ref 0.00–0.06)

## 2010-08-15 LAB — URINALYSIS, ROUTINE W REFLEX MICROSCOPIC
Bilirubin Urine: NEGATIVE
Hgb urine dipstick: NEGATIVE
Specific Gravity, Urine: 1.031 — ABNORMAL HIGH (ref 1.005–1.030)
pH: 6 (ref 5.0–8.0)

## 2010-08-15 LAB — T-HELPER CELL (CD4) - (RCID CLINIC ONLY)
CD4 % Helper T Cell: 28 % — ABNORMAL LOW (ref 33–55)
CD4 % Helper T Cell: 29 % — ABNORMAL LOW (ref 33–55)
CD4 T Cell Abs: 310 uL — ABNORMAL LOW (ref 400–2700)

## 2010-08-15 LAB — CBC
HCT: 37.7 % — ABNORMAL LOW (ref 39.0–52.0)
HCT: 39.3 % (ref 39.0–52.0)
HCT: 39.8 % (ref 39.0–52.0)
Hemoglobin: 12.9 g/dL — ABNORMAL LOW (ref 13.0–17.0)
MCH: 28.9 pg (ref 26.0–34.0)
MCHC: 32.6 g/dL (ref 30.0–36.0)
MCHC: 32.9 g/dL (ref 30.0–36.0)
MCV: 88.1 fL (ref 78.0–100.0)
Platelets: 155 10*3/uL (ref 150–400)
RDW: 13.8 % (ref 11.5–15.5)
RDW: 13.9 % (ref 11.5–15.5)
RDW: 13.9 % (ref 11.5–15.5)
WBC: 3.4 10*3/uL — ABNORMAL LOW (ref 4.0–10.5)
WBC: 3.6 10*3/uL — ABNORMAL LOW (ref 4.0–10.5)

## 2010-08-15 LAB — CARDIAC PANEL(CRET KIN+CKTOT+MB+TROPI)
CK, MB: 9.9 ng/mL (ref 0.3–4.0)
Relative Index: 2 (ref 0.0–2.5)
Total CK: 391 U/L — ABNORMAL HIGH (ref 7–232)
Troponin I: 0.01 ng/mL (ref 0.00–0.06)

## 2010-08-19 LAB — URINALYSIS, ROUTINE W REFLEX MICROSCOPIC
Bilirubin Urine: NEGATIVE
Ketones, ur: NEGATIVE mg/dL
Nitrite: NEGATIVE
Protein, ur: NEGATIVE mg/dL
pH: 5.5 (ref 5.0–8.0)

## 2010-08-30 NOTE — Assessment & Plan Note (Signed)
Summary: [facility]   Vital Signs:  Patient profile:   70 year old male Height:      69 inches (175.26 cm) Weight:      166.12 pounds (75.51 kg) BMI:     24.62 Temp:     97.7 degrees F (36.50 degrees C) oral Pulse rate:   76 / minute BP sitting:   106 / 70  (right arm)  Vitals Entered By: Wendall Mola CMA Duncan Dull) (July 09, 2010 3:27 PM) CC: hospital followup URI, pt. c/o right shoulder pain from fall, pt. lost meds and needs refills on all except Atripla and Naproxen, requests Viagra Is Patient Diabetic? No Pain Assessment Patient in pain? yes     Location: right shoulder Intensity: 9 Type: aching Onset of pain  Intermittent Nutritional Status BMI of 19 -24 = normal Nutritional Status Detail appetite "ok"  Have you ever been in a relationship where you felt threatened, hurt or afraid?No   Does patient need assistance? Functional Status Self care Ambulation Normal Comments pt. missed one dose of Atripla since last visit   CC:  hospital followup URI, pt. c/o right shoulder pain from fall, pt. lost meds and needs refills on all except Atripla and Naproxen, and requests Viagra.  Preventive Screening-Counseling & Management  Alcohol-Tobacco     Alcohol drinks/day: 0     Smoking Status: quit  Caffeine-Diet-Exercise     Caffeine use/day: occasional     Does Patient Exercise: yes     Type of exercise: gym     Exercise (avg: min/session): 30-60     Times/week: 4  Safety-Violence-Falls     Seat Belt Use: yes      Sexual History:  n/a.        Drug Use:  never.    Comments: pt. given condoms  Allergies (verified): No Known Drug Allergies   Medications Added to Medication List This Visit: 1)  Ultram 50 Mg Tabs (Tramadol hcl) .... Take 1 tablet by mouth every 6 hours as needed  Other Orders: T-Comprehensive Metabolic Panel ([id number]) Est. Patient Level IV (14782) Hepatitis B Vaccine >64yrs (95621) Admin 1st Vaccine (30865) Orthopedic Surgeon  Referral (Ortho Surgeon) Future Orders: T-Chlamydia  Probe, urine [phone]) ... 09/17/2010 T-CBC w/Diff ([id number]) ... 09/17/2010 T-CD4SP ([facility]) (CD4SP) ... 09/17/2010 T-Comprehensive Metabolic Panel [phone]) ... 09/17/2010 T-HIV Viral Load [phone]) ... 09/17/2010 T-RPR (Syphilis) [phone]) ... 09/17/2010 T-Hepatitis C Viral Load [phone]) ... 09/17/2010 T-Hepatitis B Surface Antibody [phone]) ... 09/17/2010 T-Hepatitis B Surface Antigen [phone]) ... 09/17/2010 T-Lipid Profile [phone]) ... 09/17/2010 T-GC Probe, urine [phone]) ... 09/17/2010 T-Urinalysis (81003-65000) ... 09/17/2010  Patient Instructions: 1)  STOP HYDROCHLOROTHIAZIDE 2)  May take Trammadol 50mg  1 tablet by mouth every 6 hours as needed for pain.  Medication may make you sleepy.  Avoid operating machinery while taking this medication. 3)  Please schedule a follow-up appointment in 3 months. 4)  Be sure to return for lab work two (2) weeks before your next appointment as scheduled. 5)  Advised not to eat any food or drink any liquids after 10 PM the night before labs. Prescriptions: VIAGRA 50 MG TABS (SILDENAFIL CITRATE) use as directed  #10 x 2   Entered and Authorized by:   Talmadge Chad NP   Signed by:   Talmadge Chad NP on 07/09/2010   Method used:   Electronically to        [facility] [address] [phone]* (retail)       [phone]  [address]       [place], [place]  [address]       Ph: [phone]       Fax: [phone]   RxID:   [id number] VASOTEC 20 MG TABS (ENALAPRIL MALEATE) Take 1 tablet by mouth once a day  #30 x 5   Entered and Authorized by:   Talmadge Chad NP   Signed by:   Talmadge Chad NP on 07/09/2010   Method used:   Electronically to        [facility] [address] [phone]* (retail)       [address]       [place], [place]  [address]       Ph: [phone]       Fax: [phone]   RxID:   [id number] ATRIPLA 600-200-300 MG TABS  (EFAVIRENZ-EMTRICITAB-TENOFOVIR) Take 1 tablet by mouth at bedtime  #30 x prn   Entered and Authorized by:   Talmadge Chad NP   Signed by:   Talmadge Chad NP on 07/09/2010   Method used:   Electronically to        [facility] [phone]* (retail)       [address]       [place], [place]  [address]       Ph: [phone]       Fax: [phone]   RxID:   [id number] NAPROXEN 500 MG TABS (NAPROXEN) Take 1 tablet by mouth two times a day  #60 x 5   Entered and Authorized by:   Talmadge Chad NP   Signed by:   Talmadge Chad NP on 07/09/2010   Method used:   Electronically to        [facility] [phone]* (retail)       [address]       [place], [place]  [address]       Ph: [phone]       Fax: [phone]   RxID:   [id number] CLARITIN 10 MG TABS (LORATADINE) Take 1 tablet by mouth once a day  #30 x 5   Entered and Authorized by:   Talmadge Chad NP   Signed by:   Talmadge Chad NP on 07/09/2010   Method used:   Electronically to        [facility] [address] [phone]* (retail)       [address]       [place], [place]  [address]       Ph: [phone]       Fax: [phone]   RxID:   [id number] PROTONIX 40 MG TBEC (PANTOPRAZOLE SODIUM) Take 1 tablet by mouth once a day  #30 x 5   Entered and Authorized by:   Talmadge Chad NP   Signed by:   Talmadge Chad NP on 07/09/2010   Method used:   Electronically to        [facility] [address] [phone]* (retail)       [address]       [place], [place]  [address]       Ph: [phone]       Fax: [phone]   RxID:   [id number] ULTRAM 50 MG TABS (TRAMADOL HCL) Take 1 tablet by mouth every 6 hours as needed  #75 x 2   Entered and Authorized by:   Talmadge Chad NP   Signed by:   Talmadge Chad NP on 07/09/2010   Method used:   Electronically to  [facility]  [address] [phone]* (retail)       [address]       [place], [place]  [address]       Ph:  [phone]       Fax: [phone]   RxID:   [id number]    Orders Added: 1)  T-Comprehensive Metabolic Panel [80053-22900] 2)  T-Chlamydia  Probe, urine [phone] 3)  T-CBC w/Diff [56433-29518] 4)  T-CD4SP [facility]) [CD4SP] 5)  T-Comprehensive Metabolic Panel [80053-22900] 6)  T-HIV Viral Load [phone] 7)  T-RPR (Syphilis) [phone] 8)  T-Hepatitis C Viral Load [73220-25427] 9)  T-Hepatitis B Surface Antibody [06237-62831] 10)  T-Hepatitis B Surface Antigen [51761-60737] 11)  T-Lipid Profile [80061-22930] 12)  Est. Patient Level IV [10626] 13)  Hepatitis B Vaccine >40yrs [90746] 14)  Admin 1st Vaccine [90471] 15)  T-GC Probe, urine [87591-59915] 16)  T-Urinalysis [81003-65000] 17)  Orthopedic Surgeon Referral [Ortho Surgeon]   Immunizations Administered:  Hepatitis B Vaccine # 2:    Vaccine Type: HepB Adult    Site: right deltoid    Mfr: Merck    Dose: 0.5 ml    Route: IM    Given by: Wendall Mola CMA ( AAMA)    Exp. Date: 03/06/2012    Lot #: [id number]    VIS given: 12/18/05 version given July 09, 2010.   Immunizations Administered:  Hepatitis B Vaccine # 2:    Vaccine Type: HepB Adult    Site: right deltoid    Mfr: Merck    Dose: 0.5 ml    Route: IM    Given by: Wendall Mola CMA ( AAMA)    Exp. Date: 03/06/2012    Lot #: [id number]    VIS given: 12/18/05 version given July 09, 2010.

## 2010-09-04 NOTE — Discharge Summary (Signed)
NAME:  Billy Lopez, BERLINGER NO.:  [id number]  MEDICAL RECORD NO.:  [id number]          PATIENT TYPE:  INP  LOCATION:  3711                         FACILITY:  [facility]  PHYSICIAN:  Tilford Pillar, MD     DATE OF BIRTH:  01/15/1941  DATE OF ADMISSION:  06/23/2010 DATE OF DISCHARGE:  06/25/2010                              DISCHARGE SUMMARY   DISCHARGE DIAGNOSES: 1. Viral upper respiratory illness. 2. Acute renal insufficiency. 3. Hypertension. 4. Human immunodeficiency virus. 5. Hepatitis C. 6. Hepatitis B.  DISCHARGE MEDICATIONS: 1. Protonix 40 mg tablets, take 1 tablet daily by mouth. 2. Claritin 10 mg, take 1 tablet daily by mouth. 3. Neurontin 300 mg, take 1 tablet three times daily. 4. Naprosyn 500 mg tablets, take 1 tablet twice daily. 5. Flexeril 10 mg, take 1 tablet every 8 hours as needed. 6. Atripla take 1 tablet by mouth at bedtime. 7. Bactroban 2% ointment, apply twice daily.8. Ensure liquids, take 1 can twice daily. 9. Vasotec 20 mg, take 1 tablet by mouth daily. 10.Ultram 50 mg, take 1 tablet by mouth every 8 hours as needed. 11.Viagra 50 mg tablets, take 1 as directed. 12.Avelox 400 mg, take 1 tablet by mouth daily for the next 3 days.  DISPOSITION AND FOLLOWUP:  [name] was discharged from [facility] in stable and improved condition.  He should discontinue his hydrochlorothiazide until his followup appointment.  He should have a followup appointment with Dr. Philipp Deputy on July 02, 2010, at 3:45 in the afternoon.  At this time, he should have his blood pressure checked with the stopping of his hydrochlorothiazide, and if his blood pressure is elevation he should have medications added back as appropriate.  He should also continue additional 3 days of Avelox.  He should continue taking all of his other medication as prescribed.  CONSULTATIONS:  None.  PROCEDURE PERFORMED: 1. A chest x-ray which showed right basilar opacity  suspicious for     pneumonia giving clinical symptoms.  Followup chest radiography     following therapy is recommended. 2. Head CT, findings of no hemorrhage, hydrocephalus, mass effect, or     mass lesion or midline shift.  No evidence of acute infarction.     The skull is intact.  There are visualized paranasal sinus and     mastoid air cells are clear. 3. A chest x-ray which shows improved aeration of the right lung base     compared to the recent portable chest radiograph performed on     June 23, 2010.  Findings on the portable chest radiography may     have been due to atelectasis.  Currently, no definite radiographic     evidence of pneumonia and bilateral shoulder degenerative changes. 4. Abdominal x-ray which showed a convex right scoliosis of the lumbar     spine, mild in degree.  There is multilevel degenerative change in     the lumbar spine.  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  No evidence of hardware loosening.  The bony pelvis     was  intact, and the bowel-gas pattern is nonobstructive.  No     abdominal mass effect or significant stool burden.  No free     intraperitoneal air.  ADMISSION HISTORY OF PRESENT ILLNESS:  The patient is a 70 year old man with a past medical history significant for HIV with a CD-4 count of 400 and viral load undetectable in November 2011, HCV, hepatitis B, and hypertension, who presents to the [facility] Emergency Department with complaints of feeling unwell.  The patient reports he began feeling unwell early in the morning of admission.  He states that he broke out in a sweat, felt nauseated and passed out.  He denied any loss of consciousness.  He states that he vomited nonbloody, nonbilious emesis x3, the last episode was while he was in the ambulance with the EMS.  He reports a cough began today that was productive of clear mucus.  He believes that he has had fevers throughout the day, but has not taken his  temperature.  He reports some contact with people who were coughing. Denies any recent travel or recent hospitalization.  He reports 100% compliance with his HAART.  He admits to one day of chest pain and is centrally located and radiates to his left shoulder.  He states that the chest is mild and does not describe any precipitating, worsening, or alleviating factors.  He reports associated an shortness of breath that has persistent throughout the day.  He denies any abdominal pain, diarrhea, headache, bright red blood per rectum, black tarry stools, weakness, dysuria, hematuria, visual changes, or other complaints.  ADMISSION PHYSICAL EXAMINATION:  VITAL SIGNS:  Temperature was 98.0, blood pressure was 102/73, pulse was 88, respirations were 16, saturating 96% on 2 L nasal cannula.  Orthostatic blood pressures lying was 114/75 with a pulse of 83, sitting was 114/74, pulse of 88, standing was 115/76 with a pulse of 96. GENERAL:  This is an alert, well-developed, uncomfortable-appearing male with occasional shaking, alert and oriented x3.  The patient cooperative to exam but often refuses to answer questions. HEAD:  Normocephalic, atraumatic. EYES:  Pupils equal, round, reactive to light.  Extraocular movements are intact.  Vision grossly intact.  Conjunctiva and sclerae are clear, within normal limits. MOUTH:  Mucous membranes are moist.  No erythema, exudates, or lesions. NECK:  Supple.  Full range of motion.  Trachea midline.  No palpable masses.  No JVD. LUNGS:  Clear to auscultation bilaterally.  There was decreased breath sounds in bilateral lung bases, right greater than left.  Normal respiratory effort.  No rhonchi or wheezes noted. HEART:  Regular rate and rhythm.  No murmurs, rubs, gallops. ABDOMEN:  Soft but mildly distended, diffusely tender to palpation. Positive guarding.  Bowel sounds are present and active.  No palpable masses. MUSCULOSKELETAL:  No joint swelling,  warmth, erythema NEUROLOGIC:  Cranial nerves II through XII were intact.  Strength was globally equal in also signs, and sensation was grossly intact. SKIN:  Turgor was normal.  No rashes. PSYCHIATRIC:  Memory intact for recent and remote.  Normally interactive.  Good eye contact, and affect was as expected.  LABORATORIES:  WBCs were 6.3, BUN was 13.8, hematocrit was 41.1, MCV was 87.4, platelet count was 201,000.  Sodium was 139, potassium was 4.3, chloride was 109, bicarb was 24, BUN was 31, creatinine was 1.7, glucose was 119.  Bilirubin total was 0.3, direct was 0.1, indirect was 0.2. Alkaline phos was 97, AST was 48, ALT 44.  Total protein was  8.0, albumin was 4.0, INR was 0.99, lipase was 25.  Urinalysis was positive for a small amount of bilirubin, small amount of protein, 3-6 white cells, 0-2 red cells, and few bacteria.  HOSPITAL COURSE BY PROBLEM LIST: 1. Viral URI.  On admission, there was suspicion of a pneumonia with     findings on the chest x-ray.  He was admitted and started on Maalox     and clindamycin several hours after the original x-ray.  A repeat x-     ray was done and showed complete resolution of the opacity seen.     He had no fever throughout his admission.  His white count was     normal which leads away from pneumonia, so the clindamycin was     stopped.  He complained about some congestion and cough throughout     his admission that was likely related to a viral URI.  Given his     medical history, he was started on Avelox and completed a 5-day     course.  He was given Mucinex the day prior to discharge and this     felt to clear his congestion considerably. 2. Acute renal insufficiency.  On admission, his creatinine was 1.7     which was above his baseline of 1.3, FENa 0.28% which indicated     likely a prerenal cause.  He was given IV hydration and his     creatinine at discharge had trended down to 1.35. 3. Hypertension.  On admission, he had  increased creatinine and     clinical dehydration, so his hydrochlorothiazide and enalapril were     held.  His blood pressure was controlled during his stay, and he     was discharged on enalapril but not hydrochlorothiazide.  He will     need follow up in the clinic and the hydrochlorothiazide added back     as appropriate. 4. HIV.  He was maintained on his Atripla during his admission.  CD-4     count on this admission was 380.  He will follow up with Dr.     Philipp Deputy as an outpatient.  DISCHARGE VITALS:  Temperature was 98.3, pulse was 57, blood pressure was 142/89, respirations were 18, saturating 100% on room air.  DISCHARGE LABORATORIES:  Troponins were 0.01, CK was 380, MB was 9.8.    ______________________________ Leodis Sias, MD   ______________________________ Tilford Pillar, MD    CP/MEDQ  D:  07/05/2010  T:  07/05/2010  Job:  [id number]  cc:   Tresa Endo L. Philipp Deputy, M.D.  Electronically Signed by Leodis Sias MD on 07/09/2010 12:25:04 AM Electronically Signed by Tilford Pillar  on 07/13/2010 07:59:47 PM

## 2010-09-12 ENCOUNTER — Other Ambulatory Visit: Payer: Self-pay | Admitting: *Deleted

## 2010-09-12 DIAGNOSIS — B20 Human immunodeficiency virus [HIV] disease: Secondary | ICD-10-CM

## 2010-09-12 MED ORDER — ENSURE PO LIQD: 1.0000 | Freq: Two times a day (BID) | ORAL | Status: DC

## 2010-09-25 ENCOUNTER — Other Ambulatory Visit (INDEPENDENT_AMBULATORY_CARE_PROVIDER_SITE_OTHER): Payer: PRIVATE HEALTH INSURANCE

## 2010-09-25 DIAGNOSIS — B20 Human immunodeficiency virus [HIV] disease: Secondary | ICD-10-CM

## 2010-09-25 DIAGNOSIS — Z113 Encounter for screening for infections with a predominantly sexual mode of transmission: Secondary | ICD-10-CM

## 2010-09-25 LAB — LIPID PANEL
HDL: 54 mg/dL (ref >39)
LDL Cholesterol: 79 mg/dL (ref 0–99)
Total CHOL/HDL Ratio: 3 Ratio
Triglycerides: 142 mg/dL (ref <150)

## 2010-09-25 LAB — RPR

## 2010-09-26 LAB — COMPLETE METABOLIC PANEL WITH GFR
ALT: 44 U/L (ref 0–53)
AST: 56 U/L — ABNORMAL HIGH (ref 0–37)
Albumin: 4.2 g/dL (ref 3.5–5.2)
CO2: 24 mEq/L (ref 19–32)
Calcium: 8.7 mg/dL (ref 8.4–10.5)
Chloride: 104 mEq/L (ref 96–112)
Creat: 1.02 mg/dL (ref 0.40–1.50)
GFR, Est African American: >60 mL/min (ref >60)
Potassium: 4.1 mEq/L (ref 3.5–5.3)
Total Protein: 7.4 g/dL (ref 6.0–8.3)

## 2010-09-26 LAB — T-HELPER CELL (CD4) - (RCID CLINIC ONLY): CD4 T Cell Abs: 350 uL — ABNORMAL LOW (ref 400–2700)

## 2010-09-26 LAB — HEPATITIS B SURFACE ANTIGEN: Hepatitis B Surface Ag: NEGATIVE

## 2010-09-26 LAB — CBC WITH DIFFERENTIAL/PLATELET
Basophils Absolute: 0.1 10*3/uL (ref 0.0–0.1)
Eosinophils Relative: 2 % (ref 0–5)
HCT: 38 % — ABNORMAL LOW (ref 39.0–52.0)
Lymphocytes Relative: 26 % (ref 12–46)
Lymphs Abs: 1.2 10*3/uL (ref 0.7–4.0)
MCV: 88.2 fL (ref 78.0–100.0)
Monocytes Absolute: 0.7 10*3/uL (ref 0.1–1.0)
Neutro Abs: 2.6 10*3/uL (ref 1.7–7.7)
Platelets: 214 10*3/uL (ref 150–400)
RBC: 4.31 MIL/uL (ref 4.22–5.81)
WBC: 4.6 10*3/uL (ref 4.0–10.5)

## 2010-09-26 LAB — HEPATITIS B SURFACE ANTIBODY,QUALITATIVE: Hep B S Ab: NEGATIVE

## 2010-09-27 LAB — HIV-1 RNA QUANT-NO REFLEX-BLD: HIV-1 RNA Quant, Log: <1.3 {Log} (ref <1.30)

## 2010-10-09 ENCOUNTER — Ambulatory Visit: Payer: PRIVATE HEALTH INSURANCE | Admitting: Adult Health

## 2010-10-19 NOTE — Op Note (Signed)
[place]  Patient:    Billy Lopez, Billy Lopez Visit Number: [id number] MRN: [id number]          Service Type: DSU Location: [facility] Attending Physician:  Vikki Ports Dictated by:   Vikki Ports, M.D. Proc. Date: 05/04/01 Admit Date:  05/04/2001                             Operative Report  PREOPERATIVE DIAGNOSIS:  Right inguinal hernia.  POSTOPERATIVE DIAGNOSIS:  Right inguinal hernia.  PROCEDURE:  Right inguinal hernia repair with mesh.  SURGEON:  Vikki Ports, M.D.  ANESTHESIA:  General.  DESCRIPTION OF PROCEDURE:  The patient was taken to the operating room, placed in a supine position.  After adequate general anesthesia was induced, the right groin was prepped and draped in a normal sterile fashion.  Because of the patients HIV and positive hepatitis C serologies, skin incision was made in the right inguinal region with a Bovie cutting mode.  Dissected down to the external oblique fascia which was opened along its fibers.  Blunt dissection was carried up onto the transverse ______ fascia inferiorly to the inguinal ligament.  Ilioinguinal nerve which had several branches that pierced through the external oblique fascia as well as running along the spermatic cord was identified and preserved.  There were at least two small branches off the nerve that coursed across the spermatic cord and had to be sacrificed.  The spermatic cord was surrounded, skeletonized, no indirect hernia defect was seen, however, there was a large piece of preperitoneal fat which was excised. Direct hernia defect was identified.  Blunt dissection was performed on the preperitoneal space.  A large Prolene hernia system was then placed.  The preperitoneal patch was placed bluntly down to the pubic tubercle until it laid flat in all directions.  The accompanying onlay mesh was intact medially to the tubercle superiorly to the transverse ______  fascia, inferiorly to inguinal ligaments.  Flip brought out around the internal ring and the cord, and tacked laterally.  This was accomplished with a running 2-0 Prolene suture.  Adequate hemostasis was insured.  The external oblique fascia was closed with a running 3-0 Vicryl.  All tissues were injected using 0.25% Marcaine.  Skin was closed with staples.  The patient tolerated the procedure well, and went to PACU in good condition. Dictated by:   Vikki Ports, M.D. Attending Physician:  Vikki Ports DD:  05/04/01 TD:  05/04/01 Job: [id number]

## 2010-10-19 NOTE — Discharge Summary (Signed)
NAME:  Billy Lopez, Billy Lopez NO.:  [id number]   MEDICAL RECORD NO.:  [id number]                   PATIENT TYPE:  INP   LOCATION:  3734                                 FACILITY:  [facility]   PHYSICIAN:  Leighton Roach McDiarmid, M.D.             DATE OF BIRTH:  02-16-1941   DATE OF ADMISSION:  04/20/2003  DATE OF DISCHARGE:  04/22/2003                                 DISCHARGE SUMMARY   DISCHARGE DIAGNOSES:  1. Chest pain.  2. Abdominal pain.  3. Human immunodeficiency virus.  4. Hypertension.  5. Chronic obstructive pulmonary disease.  6. Degenerative disc disease.  7. Arthritis.   DISCHARGE MEDICATIONS:  1. Aspirin 325 mg p.o. daily.  2. Metoprolol 25 mg p.o. daily.  3. Protonix 40 mg p.o. daily.  4. Nitroglycerin 0.4 mg sublingual q. 5 minutes p.r.n.   STUDIES:  1. A 2-D echocardiogram  which showed LV systolic function with normal LVEF     of 55 to 65% with no significant valvular regurgitation.  2. Adenosine Cardiolite test performed on April 22, 2003, was negative     for ischemia, EF consistent with 61% with diaphragmatic attenuation at     the inferior wall as read by Dr. Antoine Poche.   HOSPITAL COURSE:  Billy Lopez is a 70 year old African American male  currently not on any antiretrovirals for his HIV status.  He presented with  acute onset of chest pain that was secondary to lower abdominal pain and  cramping that started the day prior to admission.  The pain from his abdomen  radiated in his head and chest and he described a burning pain in his right  shoulder and arm associated with diaphoresis.  He denied any prior episodes  and denied any concurrent shortness of breath or nausea and vomiting.  While  in the emergency department he had three negative sets of cardiac markers  and was admitted to the telemetry floor to continue his rule out.  His EKG  showed a normal sinus rhythm with a heart rate of 61, normal axis, and  biphasic T-waves in V1  through V4 as well as T-wave inversion in leads I,  aVL and V5 through V6.   For his abdominal pain, differential included GERD and GI bleed as the  patient was on Naprosyn.  Hemoccults were performed and were negative.  The  patient was placed on Protonix during his hospital stay.  Abdominal pain  resolved prior to discharge home without medications.   For his chest pain, the patient was ruled out with three sets of negative  cardiac enzymes.  EKG changes, as stated above, with new Q-waves in leads  V2.  Due to EKG changes and history of chest pain, cardiology was consulted.  Dr. Antoine Poche saw the patient.  A 2-D echocardiogram  was performed followed  by a stress Cardiolite.  His risk factors for cardiac disease included  hypertension  and history of IV drug use.   Following his Cardiolite study on April 22, 2003, which had fairly normal  results except for diaphragmatic attenuation of the inferior wall,  cardiology said it was okay for the patient to be discharged home with  appropriate follow-up.  He had a fasting lipid panel that was still pending.  He was sent home on aspirin, beta-blocker and was to follow up as an  outpatient with cardiology.  He is also to follow up with Dr. Emeline Darling for his  HIV, COPD and other chronic medical problems.      Billy Lopez, D.O.                         Etta Grandchild, M.D.    Billy Lopez  D:  05/24/2003  T:  05/24/2003  Job:  [id number]   cc:   Dr. Golden Hurter, M.D.
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
## 2010-10-31 ENCOUNTER — Other Ambulatory Visit: Payer: Self-pay | Admitting: Licensed Clinical Social Worker

## 2010-10-31 DIAGNOSIS — R52 Pain, unspecified: Secondary | ICD-10-CM

## 2010-10-31 MED ORDER — TRAMADOL HCL 50 MG PO TABS: 50.0000 mg | ORAL_TABLET | Freq: Four times a day (QID) | ORAL | Status: DC | PRN

## 2010-12-19 ENCOUNTER — Other Ambulatory Visit: Payer: Self-pay | Admitting: Infectious Diseases

## 2010-12-19 ENCOUNTER — Other Ambulatory Visit: Payer: Self-pay | Admitting: Licensed Clinical Social Worker

## 2010-12-19 ENCOUNTER — Other Ambulatory Visit: Payer: PRIVATE HEALTH INSURANCE

## 2010-12-19 DIAGNOSIS — B20 Human immunodeficiency virus [HIV] disease: Secondary | ICD-10-CM

## 2010-12-19 DIAGNOSIS — R52 Pain, unspecified: Secondary | ICD-10-CM

## 2010-12-19 LAB — CBC WITH DIFFERENTIAL/PLATELET
Eosinophils Absolute: 0.2 10*3/uL (ref 0.0–0.7)
Eosinophils Relative: 4 % (ref 0–5)
HCT: 40.6 % (ref 39.0–52.0)
Lymphs Abs: 1.2 10*3/uL (ref 0.7–4.0)
MCH: 27.9 pg (ref 26.0–34.0)
MCV: 86.6 fL (ref 78.0–100.0)
Monocytes Absolute: 0.6 10*3/uL (ref 0.1–1.0)
Platelets: 194 10*3/uL (ref 150–400)
RBC: 4.69 MIL/uL (ref 4.22–5.81)

## 2010-12-19 MED ORDER — TRAMADOL HCL 50 MG PO TABS: 50.0000 mg | ORAL_TABLET | Freq: Four times a day (QID) | ORAL | Status: DC | PRN

## 2010-12-20 LAB — T-HELPER CELL (CD4) - (RCID CLINIC ONLY)
CD4 % Helper T Cell: 31 % — ABNORMAL LOW (ref 33–55)
CD4 T Cell Abs: 400 uL (ref 400–2700)

## 2010-12-20 LAB — COMPLETE METABOLIC PANEL WITH GFR
AST: 49 U/L — ABNORMAL HIGH (ref 0–37)
Albumin: 3.7 g/dL (ref 3.5–5.2)
Alkaline Phosphatase: 96 U/L (ref 39–117)
Total Protein: 6.9 g/dL (ref 6.0–8.3)

## 2010-12-20 LAB — URINALYSIS, ROUTINE W REFLEX MICROSCOPIC
Leukocytes, UA: NEGATIVE
Nitrite: NEGATIVE
Specific Gravity, Urine: 1.016 (ref 1.005–1.030)
pH: 7 (ref 5.0–8.0)

## 2010-12-20 LAB — GC/CHLAMYDIA PROBE AMP, URINE: GC Probe Amp, Urine: NEGATIVE

## 2011-01-02 ENCOUNTER — Ambulatory Visit (INDEPENDENT_AMBULATORY_CARE_PROVIDER_SITE_OTHER): Payer: PRIVATE HEALTH INSURANCE | Admitting: Adult Health

## 2011-01-02 ENCOUNTER — Encounter: Payer: Self-pay | Admitting: Adult Health

## 2011-01-02 VITALS — BP 148/88 | HR 61 | Temp 97.4°F | Ht 71.0 in | Wt 162.0 lb

## 2011-01-02 DIAGNOSIS — M545 Low back pain, unspecified: Secondary | ICD-10-CM

## 2011-01-02 DIAGNOSIS — B20 Human immunodeficiency virus [HIV] disease: Secondary | ICD-10-CM

## 2011-01-02 MED ORDER — HYDROCODONE-ACETAMINOPHEN 7.5-500 MG PO TABS: 1.0000 | ORAL_TABLET | Freq: Four times a day (QID) | ORAL | Status: AC | PRN

## 2011-03-15 ENCOUNTER — Other Ambulatory Visit: Payer: Self-pay | Admitting: *Deleted

## 2011-03-15 DIAGNOSIS — B20 Human immunodeficiency virus [HIV] disease: Secondary | ICD-10-CM

## 2011-03-15 MED ORDER — ENSURE PO LIQD: 1.0000 | Freq: Two times a day (BID) | ORAL | Status: DC

## 2011-03-22 ENCOUNTER — Other Ambulatory Visit: Payer: PRIVATE HEALTH INSURANCE | Admitting: Infectious Disease

## 2011-03-22 ENCOUNTER — Ambulatory Visit (INDEPENDENT_AMBULATORY_CARE_PROVIDER_SITE_OTHER): Payer: PRIVATE HEALTH INSURANCE | Admitting: Infectious Disease

## 2011-03-22 ENCOUNTER — Ambulatory Visit (HOSPITAL_COMMUNITY)
Admission: RE | Admit: 2011-03-22 | Discharge: 2011-03-22 | Disposition: A | Discharge status: Home / Self Care | Payer: PRIVATE HEALTH INSURANCE | Source: Ambulatory Visit | Attending: Internal Medicine | Admitting: Internal Medicine

## 2011-03-22 ENCOUNTER — Encounter: Payer: Self-pay | Admitting: Infectious Disease

## 2011-03-22 ENCOUNTER — Emergency Department (HOSPITAL_COMMUNITY): Payer: PRIVATE HEALTH INSURANCE

## 2011-03-22 ENCOUNTER — Encounter: Payer: Self-pay | Admitting: Internal Medicine

## 2011-03-22 ENCOUNTER — Inpatient Hospital Stay (HOSPITAL_COMMUNITY)
Admission: EM | Admit: 2011-03-22 | Discharge: 2011-03-24 | DRG: 897 | Disposition: A | Discharge status: Home / Self Care | Payer: PRIVATE HEALTH INSURANCE | Attending: Internal Medicine | Admitting: Internal Medicine

## 2011-03-22 DIAGNOSIS — I10 Essential (primary) hypertension: Secondary | ICD-10-CM | POA: Diagnosis present

## 2011-03-22 DIAGNOSIS — Z79899 Other long term (current) drug therapy: Secondary | ICD-10-CM

## 2011-03-22 DIAGNOSIS — F19939 Other psychoactive substance use, unspecified with withdrawal, unspecified: Principal | ICD-10-CM | POA: Diagnosis present

## 2011-03-22 DIAGNOSIS — I252 Old myocardial infarction: Secondary | ICD-10-CM | POA: Insufficient documentation

## 2011-03-22 DIAGNOSIS — R079 Chest pain, unspecified: Secondary | ICD-10-CM | POA: Insufficient documentation

## 2011-03-22 DIAGNOSIS — Z96649 Presence of unspecified artificial hip joint: Secondary | ICD-10-CM

## 2011-03-22 DIAGNOSIS — R0789 Other chest pain: Secondary | ICD-10-CM

## 2011-03-22 DIAGNOSIS — M62838 Other muscle spasm: Secondary | ICD-10-CM | POA: Diagnosis present

## 2011-03-22 DIAGNOSIS — M810 Age-related osteoporosis without current pathological fracture: Secondary | ICD-10-CM | POA: Diagnosis present

## 2011-03-22 DIAGNOSIS — B20 Human immunodeficiency virus [HIV] disease: Secondary | ICD-10-CM

## 2011-03-22 DIAGNOSIS — Z21 Asymptomatic human immunodeficiency virus [HIV] infection status: Secondary | ICD-10-CM | POA: Diagnosis present

## 2011-03-22 DIAGNOSIS — F1910 Other psychoactive substance abuse, uncomplicated: Secondary | ICD-10-CM

## 2011-03-22 DIAGNOSIS — F1210 Cannabis abuse, uncomplicated: Secondary | ICD-10-CM | POA: Diagnosis present

## 2011-03-22 DIAGNOSIS — Z01810 Encounter for preprocedural cardiovascular examination: Secondary | ICD-10-CM

## 2011-03-22 DIAGNOSIS — F1120 Opioid dependence, uncomplicated: Secondary | ICD-10-CM | POA: Diagnosis present

## 2011-03-22 DIAGNOSIS — Z7982 Long term (current) use of aspirin: Secondary | ICD-10-CM

## 2011-03-22 DIAGNOSIS — B1920 Unspecified viral hepatitis C without hepatic coma: Secondary | ICD-10-CM | POA: Diagnosis present

## 2011-03-22 LAB — CBC
Hemoglobin: 13.1 g/dL (ref 13.0–17.0)
MCHC: 33.6 g/dL (ref 30.0–36.0)
Platelets: 156 10*3/uL (ref 150–400)
RDW: 13.7 % (ref 11.5–15.5)

## 2011-03-22 LAB — CARDIAC PANEL(CRET KIN+CKTOT+MB+TROPI)
CK, MB: 5.2 ng/mL — ABNORMAL HIGH (ref 0.3–4.0)
Troponin I: <0.3 ng/mL (ref <0.30)

## 2011-03-22 LAB — CK TOTAL AND CKMB (NOT AT ARMC): Total CK: 227 U/L (ref 7–232)

## 2011-03-22 LAB — RAPID URINE DRUG SCREEN, HOSP PERFORMED: Opiates: POSITIVE — AB

## 2011-03-22 LAB — TROPONIN I: Troponin I: <0.3 ng/mL (ref <0.30)

## 2011-03-22 LAB — BASIC METABOLIC PANEL
Calcium: 9.2 mg/dL (ref 8.4–10.5)
Creatinine, Ser: 0.88 mg/dL (ref 0.50–1.35)
GFR calc Af Amer: >90 mL/min (ref >90)
GFR calc non Af Amer: 86 mL/min — ABNORMAL LOW (ref >90)

## 2011-03-22 MED ORDER — ASPIRIN 325 MG PO TABS
325.0000 mg | ORAL_TABLET | Freq: Every day | ORAL | Status: AC
  Administered 2011-03-22: 325 mg via ORAL

## 2011-03-22 MED ORDER — NITROGLYCERIN 0.3 MG SL SUBL
0.4000 mg | SUBLINGUAL_TABLET | Freq: Once | SUBLINGUAL | Status: AC
  Administered 2011-03-22: 0.3 mg via SUBLINGUAL

## 2011-03-22 NOTE — Progress Notes (Signed)
Subjective:    Patient ID: Billy Lopez, male    DOB: 06/08/1940, 70 y.o.   MRN: [id number]  HPI  Billy Lopez is a 70 y.o. male who is doing superbly well on their antiviral regimen (atripla) with undetectable viral load and health cd4 count who presented to clinic today for his scheduled flu shot. He told the staff that he was having substernal chest pain radiating into both of his arms and to his neck accompanied by nausea. He had similar pain yesterday and for the past week. He knows that he has been once again using heroine which he has been snorting for the past several weeks and wonders if this may be related to his chest pain. Chest pain comes on largely exertion with exertion and is relieved by rest. It is across the whole chest bilaterally left and right it is a sharp sensation also at times pressured at times up to 7 or 8/10 in severity. He had similar chest pain in January of this year when he was admitted to the hospital to the teaching service in 2012. He has a mother who has had several heart attacks although he does not know if she had early coronary artery disease. In addition to snorting heroine and past history of cocaine he does use marijuana but denies smoking tobacco. His other risk factors for coronary disease do include hypertension and of course HIV itself. The patient was given 4 baby aspirin here in the [facility]. He was given a sublingual nitroglycerin which made his chest pain go away. I examined the patient he was lying on his back comfortably but did appear anxious. Upon my exam I did note that he had significant hyperesthesia and pain in his chest wall I palpated both the right and left chest muscles. He stated that pain though that was not the same pain he was having with exertion. He also didn't GERD worse the other chest pain getting worse with deep breaths at times. The duration of his chest pain typically is for least 15 minutes if not longer and has been as mentioned  relieved by rest. At other times he tried to "walk it off" EKG performed here in clinic shows T-wave inversion in aVL 12 which are not new compared to prior EKG in January 2012. It also shows his deeper T-wave inversions in V5 and V6 again which were present in 2012 in January. He has ST segment elevation in V4 that is isolated and was not present on EKG in 06/24/2010. The slope of the ST segment is more consistent with left ventricular hypertrophy rather than microinfarction again is that the only found one lead. He also does have Q waves in V1 and V2 and V3 but these are old. While I think part of his pain may be related to muscle strain and spasm and that much of his symptoms may be related to heroine use I do think this patient deserves further workup in triage in the emergency department therefore to move into numerous partners come for blood work stat cardiac enzymes and further evaluation and management. In total we spent greater than 45 minutes with this patient including the present time in counseling the patient and coordination of care. With regards to his activities therapy he claims to be 100% adherent to his Atripla and certainly his past record is suggested that he would be now as well.   Review of Systems  Constitutional: Positive for diaphoresis, activity change and fatigue. Negative  for fever, chills, appetite change and unexpected weight change.  HENT: Negative for congestion, sore throat, rhinorrhea, sneezing, trouble swallowing and sinus pressure.   Eyes: Negative for photophobia and visual disturbance.  Respiratory: Positive for shortness of breath. Negative for cough, chest tightness, wheezing and stridor.   Cardiovascular: Positive for chest pain. Negative for palpitations and leg swelling.  Gastrointestinal: Negative for nausea, vomiting, abdominal pain, diarrhea, constipation, blood in stool, abdominal distention and anal bleeding.  Genitourinary: Negative for dysuria, hematuria,  flank pain and difficulty urinating.  Musculoskeletal: Positive for myalgias, back pain and arthralgias. Negative for joint swelling and gait problem.  Skin: Negative for color change, pallor, rash and wound.  Neurological: Negative for dizziness, tremors, weakness and light-headedness.  Hematological: Negative for adenopathy. Does not bruise/bleed easily.  Psychiatric/Behavioral: Negative for behavioral problems, confusion, sleep disturbance, dysphoric mood, decreased concentration and agitation.       Objective:   Physical Exam  Constitutional: He is oriented to person, place, and time. He appears well-developed and well-nourished. He appears distressed.  HENT:  Head: Normocephalic and atraumatic.  Mouth/Throat: Oropharynx is clear and moist. No oropharyngeal exudate.  Eyes: Conjunctivae and EOM are normal. Pupils are equal, round, and reactive to light. No scleral icterus.  Neck: Normal range of motion. Neck supple. No JVD present.  Cardiovascular: Normal rate, regular rhythm and normal heart sounds.  Exam reveals no gallop and no friction rub.   No murmur heard. Pulmonary/Chest: Effort normal and breath sounds normal. No respiratory distress. He has no wheezes. He has no rales. He exhibits no tenderness.    Abdominal: He exhibits no distension and no mass. There is no tenderness. There is no rebound and no guarding.  Musculoskeletal: He exhibits no edema and no tenderness.  Lymphadenopathy:    He has no cervical adenopathy.  Neurological: He is alert and oriented to person, place, and time. He has normal reflexes. He exhibits normal muscle tone. Coordination normal.  Skin: Skin is warm and dry. He is not diaphoretic. No erythema. No pallor.  Psychiatric: He has a normal mood and affect. His behavior is normal. Judgment and thought content normal.          Assessment & Plan:  Chest pain, atypical Given the fact unable to produce chest pain this patient by pushing on his chest  muscles I can't help but wonder if a large amount of his pain may be related to muscle spasms. However he claims that the other chest pain he has been having besides the one produced by push on his chest is different in that it is accompanied by nausea and radiation into the shoulders. Factor that chest pain doesn't happen with exertion in particular makes me have some concern for genuine coronary artery disease ischemia. He does have a history of cocaine to certainly vasospasm from cocaine could be playing a role. He does have a a first-degree relative who may have had her early coronary disease. He has poorly controlled hypertension. He does not have hyperlipidemia. Looking at his EKG has T wave inversions in his inferolateral leads and in V 3 through through 6. He does have isolated ST segment elevation in V4 the morphology is not consistent with ischemia. Chest pain was relieved with nitro x 1. Will put in ER for further labs adn wokrup  HIV DISEASE Very well controlled. Would recheck hiv vl and cd4 in house. Needs flu shot  Substance abuse Heroine and cocaine as well as marijuana no history of tobacco use.  During much of his symptoms could be related to his heroine use.

## 2011-03-22 NOTE — H&P (Signed)
Hospital Admission Note Date: 03/22/2011  Patient name: Billy Lopez Medical record number: [id number] Date of birth: Nov 30, 1940 Age: 70 y.o. Gender: male PCP: Carolin Guernsey, NP, NP  Medical Service:  Attending physician:     1st Contact:  Dr. Quentin Ore           Pager: [phone]  2nd Contact: Dr. Elyse Jarvis          Pager: [phone]  After 5 pm or weekends: 1st Contact:  Pager: [phone] 2nd Contact:  Pager: [phone]  Chief Complaint:   History of Present Illness: Patient is a 70 y.o. male with a PMHx of heroin abuse, HIV (Last CD4- 420), hepatitis C, Hepatitis B and prior admissions for non-cardiac chest pain who presents with chest pain x 1 week.  Patient is a 70 y.o. male with a PMHx of heroin abuse, HIV (Last CD4- 400 in July),  Billy Lopez states he started having pain 1 week ago. He was walking and started having pain in his hips. He states the pain then spread to his shoulders, back chest, and neck. He states that the pain has been "on and off," but could not characterize it further. He describes sweating during the painful events, but is not sure. He states that he was dizzy more or less" Short of breath and had nausea but no vomiting. He cannot tel how long the episodes last. The pain feel like an aching, electric shock. He states the pain feels better when he sits down and is worse with walking. The pain radiates down both arms. At its peak the pain was 7/10 and is currently 5/10. Billy Lopez was seen in Dr. Zenaida Niece Dam's clinic and mentioned that he was having this pain. He was given ASA and SL nitro. Per Dr. Clinton Gallant not the nitro helped, but Billy Lopez doesn't know know if id really relieved the pain.  Of note Billy Lopez is quite candid about his current heroin use, which he thinks is relat. He cannot recall if the pain is related to heroin use, but states that this pain feels like his withdrawal pain. He was admitted in November of 2011 with pain that felt like this,  but worse. The discharge summary states that his pain was thought to be non-cardiac. Markers were negative and EKG showed no evidence of ischemia.   He endorses mild abdominal pain.   Review of Systems: Per HPI. Endorses diarrhea. No constipation. No dysuria. No BRBPR, dark stools or hematuria. No headache.  . Past medical history: HIV Hepatitis B Hepatitis C Hypertension Osteoarthritis A low back pain Polysubstance abuse.  Family history: Mom died of cancer of the throat. She has a history of heart attacks. Father died in the accident.    History   Social History  . Marital Status: Legally Separated    Spouse Name: N/A    Number of Children: N/A  . Years of Education: N/A   Occupational History  . Not on file.   Social History Main Topics  . Smoking status: Never Smoker   . Smokeless tobacco: Never Used  . Alcohol Use: No  . Drug Use: 7 per week    Special: Heroin     snorts heroin & smokes marijuana  . Sexually Active: Not Currently   Other Topics Concern  . Not on file   Social History Narrative  . No narrative on file    Meds: efavirenz-emtrictabine-tenofovir (ATRIPLA) 600-200-300 MG per tablet Take 1 tablet by mouth at bedtime.  enalapril (VASOTEC) 20 MG tablet Take 20 mg by mouth daily.        ENSURE (ENSURE) Take 1 Can by mouth 2 (two) times daily.  237 mL  12  loratadine (CLARITIN) 10 MG tablet Take 10 mg by mouth daily.        naproxen (NAPROSYN) 500 MG tablet Take 500 mg by mouth 2 (two) times daily.        pantoprazole (PROTONIX) 40 MG tablet Take 40 mg by mouth daily.        sildenafil (VIAGRA) 50 MG tablet Take 50 mg by mouth as directed.        traMADol (ULTRAM) 50 MG tablet Take 1 tablet (50 mg total) by mouth every 6 (six) hours as needed.  75 tablet  2    Allergies: NKDA  Physical Exam: Vital Signs: T: 98.8 P: 57 BP: 172/101 RR: 20 O2 sat: 98% on RA  Physical Exam: General: Well-developed, well-nourished, in no acute distress;  alert, appropriate and cooperative throughout examination. Is somewhat confused. Head: Normocephalic, atraumatic. Eyes: PERRL, EOMI, No signs of anemia or jaundince. Nose: Mucous membranes moist, not inflammed, nonerythematous. Throat: Oropharynx nonerythematous, no exudate appreciated.  Neck: Supple,  Lungs: Normal respiratory effort. Clear to auscultation BL without crackles or wheezes. Heart: RRR. S1 and S2 normal without gallop, murmur, or rubs. Abdomen: BS normoactive. Soft, Nondistended. There is tenderness to palpation around the ubmilicus. No masses or organomegaly. Extremities: No pretibial edema. Neurologic: A&O X3, CN II - XII are grossly intact. Motor strength is 5/5 in the all 4 extremities, Sensations intact to light touch, Skin: No visible rashes, scars.  Lab results: Basic Metabolic Panel:  Basename 03/22/11 1213  NA 138  K 4.4  CL 107  CO2 24  GLUCOSE 90  BUN 18  CREATININE 0.88  CALCIUM 9.2  MG --  PHOS --   CBC:  Basename 03/22/11 1213  WBC 3.0*  NEUTROABS --  HGB 13.1  HCT 39.0  MCV 85.3  PLT 156   Cardiac Enzymes:  Basename 03/22/11 1943 03/22/11 1220  CKTOTAL 205 227  CKMB 5.2* 6.1*  CKMBINDEX -- --  TROPONINI <0.30 <0.30    Alcohol Level:  Basename 03/22/11 1213  ETH <11   Urine Drug Screen: L Result Name                              Result     Abnl   Normal Range     Units      Perf. Loc.  Amphetamins                              SEE NOTE.         NDT    NONE DETECTED  Barbiturates                             SEE NOTE.         NDT    Oversized comment, see footnote  1  Benzodiazepines                          SEE NOTE.         NDT    NONE DETECTED  Cocaine  SEE NOTE.         NDT    NONE DETECTED  Opiates                                  POSITIVE   a      NDT  Tetrahydrocannabinol                     POSITIVE   a      NDT  Imaging results:  Dg Chest Portable 1 View  03/22/2011  *RADIOLOGY  REPORT*  Clinical Data: Chest pain, heroin withdrawal  PORTABLE CHEST - 1 VIEW  Comparison: 06/24/2010; 06/23/2010; 04/10/2010  Findings: Grossly unchanged cardiac silhouette and mediastinal contours.  Interval development of right basilar heterogeneous opacities.  No definite pleural effusion or pneumothorax.  Grossly unchanged bones.  IMPRESSION: Right basilar heterogeneous opacities worrisome for infection and/or aspiration.  A follow-up chest radiograph in 4-6 weeks after treatment recommended to ensure resolution.  Original Report Authenticated By: Waynard Reeds, M.D.    Other results: EKG: LVH with strain pattern.   Assessment & Plan by Problem:  1. Chest pain; atypical in nature given report of intermittent chest pain radiating to his bilateral shoulders, arms, back and neck for last 1 week, that is exertional in nature, with nausea and SOB. He also admits daily heroin abuse and this could be 2/2 withdrawal. His EKG shows new T- wave inversions in V2 , V3 as scompared to his old EKG which could be ischemic. Differentials for his chest pain include muscle spasms and pain from heroin abuse/withdrawal versus ACS vs GERD vs muskuloskeletal. We think this is most likely related to his heroin abuse, however we will rule him out for cardiac cause. Admit to telemetry. Cycle CEx3. Symptomatic treatment with morphine and zofran for pain and nausea. Will also start him on Protonix.  2. Heroin abuse: Counselled him.He admits that he would be interested in detox. Will consult Behavioral Health tomorrow.  3. HIV: Last CD-4- 400, VL-<20 copies/ml from 07/12 Continue Atripla.  4. Hepatitis C- last VL- 305,000 from 04/12. May not be a candiate for therapy with the h/o drug abuse.  5. Hypertension: Continue home meds- HCTZ and enalapril.  6. DVT; Lovenox       Quentin Ore, M.D. (PGY1): ____________________________________   Date/ Time: ____________________________________     ____,  M.D. (PGY2): ____________________________________   Date/ Time: ____________________________________     ATTENDING: I performed and/or observed a history and physical examination of the patient.  I discussed the case with the residents as noted and reviewed the residents' notes.  I agree with the findings and plan--please refer to the attending physician note for more details.  Signature________________________________  Printed Name_____________________________

## 2011-03-22 NOTE — Progress Notes (Signed)
  Subjective:    Patient ID: Billy Lopez, male    DOB: [birth year], 70 y.o.   MRN: [id number]  HPI    Review of Systems     Objective:   Physical Exam        Assessment & Plan:

## 2011-03-22 NOTE — Assessment & Plan Note (Signed)
Heroine and cocaine as well as marijuana no history of tobacco use. During much of his symptoms could be related to his heroine use.

## 2011-03-22 NOTE — Assessment & Plan Note (Signed)
Very well controlled. Would recheck hiv vl and cd4 in house. Needs flu shot

## 2011-03-22 NOTE — Assessment & Plan Note (Signed)
Given the fact unable to produce chest pain this patient by pushing on his chest muscles I can't help but wonder if a large amount of his pain may be related to muscle spasms. However he claims that the other chest pain he has been having besides the one produced by push on his chest is different in that it is accompanied by nausea and radiation into the shoulders. Factor that chest pain doesn't happen with exertion in particular makes me have some concern for genuine coronary artery disease ischemia. He does have a history of cocaine to certainly vasospasm from cocaine could be playing a role. He does have a a first-degree relative who may have had her early coronary disease. He has poorly controlled hypertension. He does not have hyperlipidemia. Looking at his EKG has T wave inversions in his inferolateral leads and in V 3 through through 6. He does have isolated ST segment elevation in V4 the morphology is not consistent with ischemia. Chest pain was relieved with nitro x 1. Will put in ER for further labs adn wokrup

## 2011-03-22 NOTE — H&P (Incomplete)
Hospital Admission Note Date: 03/22/2011  Patient name: Billy Lopez Medical record number: [id number] Date of birth: Feb 01, 1941 Age: 70 y.o. Gender: male PCP: Carolin Guernsey, NP, NP  Medical Service: Internal Medicine Teaching Service   Attending physician: Dr. Doneen Poisson First Contact: Dr. Quentin Ore Pager: [phone]  Second Contact: Dr. Lavella Lemons: [phone] After Hours: First Contact Pager: [phone]  Second Contact Pager: [phone]   Chief Complaint: chest pain  History of Present Illness: Patient is a 70 y.o. male with a PMHx of heroin abuse, HIV (Last CD4- 400 in July),   Billy. Billy Lopez states he started having pain 1 week ago. He was walking and started having pain in his hips. He states the pain then spread to his shoulders, back chest, and neck.  He states that the pain has been "on and off," but could not characterize it further. He describes sweating during the painful events, but is not sure.  He states that he was dizzy more or less"  Short of breath and had nausea but no vomiting. He cannot tel how long the episodes last. The pain feel like an aching, electric shock.  He states the pain feels better when he sits down and is worse with walking. The pain radiates down both arms. At its peak the pain was 7/10 and is currently 5/10.    Of note Billy Lopez is quite candid about his current heroin use, which he thinks is relat. He cannot recall if the pain is related to heroin use, but states that this pain feels like his withdrawal pain. He was admitted in November of 2011 with pain that felt like this, but worse.  The discharge summary states that his pain was thought to be non-cardiac.  Markers were negative and EKG showed no evidence of ischemia.  He endorses mild abdominal pain.   Current Outpatient Medications: Current Facility-Administered Medications  Medication Dose Route Frequency Provider Last Rate Last Dose  . aspirin tablet 325 mg  325 mg Oral Daily  Acey Lav, MD   325 mg at 03/22/11 0900  . nitroGLYCERIN (NITROSTAT) SL tablet 0.3 mg  0.3 mg Sublingual Once Acey Lav, MD   0.3 mg at 03/22/11 5784   Current Outpatient Prescriptions  Medication Sig Dispense Refill  . efavirenz-emtrictabine-tenofovir (ATRIPLA) 600-200-300 MG per tablet Take 1 tablet by mouth at bedtime.        . enalapril (VASOTEC) 20 MG tablet Take 20 mg by mouth daily.        Marland Kitchen ENSURE (ENSURE) Take 1 Can by mouth 2 (two) times daily.  237 mL  12  . loratadine (CLARITIN) 10 MG tablet Take 10 mg by mouth daily.        . naproxen (NAPROSYN) 500 MG tablet Take 500 mg by mouth 2 (two) times daily.        . pantoprazole (PROTONIX) 40 MG tablet Take 40 mg by mouth daily.        . sildenafil (VIAGRA) 50 MG tablet Take 50 mg by mouth as directed.        . traMADol (ULTRAM) 50 MG tablet Take 1 tablet (50 mg total) by mouth every 6 (six) hours as needed.  75 tablet  2    Allergies: @ALL @  Past Medical History: No past medical history on file.  Past Surgical History: No past surgical history on file.  Family History: No family history on file.  Social History: History   Social History  . Marital Status: Legally Separated  Spouse Name: N/A    Number of Children: N/A  . Years of Education: N/A   Occupational History  . Not on file.   Social History Main Topics  . Smoking status: Never Smoker   . Smokeless tobacco: Never Used  . Alcohol Use: No  . Drug Use: 7 per week    Special: Heroin     snorts heroin & smokes marijuana  . Sexually Active: Not Currently   Other Topics Concern  . Not on file   Social History Narrative  . No narrative on file    Review of Systems: Pertinent items are noted in HPI.  Vital Signs: T: P: BP: RR: O2 sat:   Physical Exam: General: Vital signs reviewed and noted. Well-developed, well-nourished, in no acute distress; alert, appropriate and cooperative throughout examination. Head: Normocephalic,  atraumatic. Eyes: PERRL, EOMI, No signs of anemia or jaundince. Nose: Mucous membranes moist, not inflammed, nonerythematous. Throat: Oropharynx nonerythematous, no exudate appreciated.  Neck: No deformities, masses, or tenderness noted.Supple, No carotid Bruits, no JVD. Lungs: Normal respiratory effort. Clear to auscultation BL without crackles or wheezes. Heart: RRR. S1 and S2 normal without gallop, murmur, or rubs. Abdomen: BS normoactive. Soft, Nondistended, non-tender. No masses or organomegaly. Extremities: No pretibial edema. Neurologic: A&O X3, CN II - XII are grossly intact. Motor strength is 5/5 in the all 4 extremities, Sensations intact to light touch, Cerebellar signs negative. Skin: No visible rashes, scars.  Lab results:     Imaging results:    Assessment & Plan:    DVT PPX - Lovenox     Quentin Ore, M.D. (PGY1): ____________________________________   Date/ Time: ____________________________________     ____, M.D. (PGY2): ____________________________________   Date/ Time: ____________________________________     I have seen and examined the patient. I reviewed the resident/fellow note and agree with the findings and plan of care as documented. My additions and revisions are included.   Signature: ____________________________________________  Internal Medicine Teaching Service Attending    Date: ____________________________________________

## 2011-03-23 DIAGNOSIS — B20 Human immunodeficiency virus [HIV] disease: Secondary | ICD-10-CM

## 2011-03-23 DIAGNOSIS — F19939 Other psychoactive substance use, unspecified with withdrawal, unspecified: Secondary | ICD-10-CM

## 2011-03-23 DIAGNOSIS — F1120 Opioid dependence, uncomplicated: Secondary | ICD-10-CM

## 2011-03-23 LAB — CBC
MCH: 28.7 pg (ref 26.0–34.0)
Platelets: 158 10*3/uL (ref 150–400)
RBC: 4.6 MIL/uL (ref 4.22–5.81)
WBC: 4 10*3/uL (ref 4.0–10.5)

## 2011-03-23 LAB — COMPREHENSIVE METABOLIC PANEL
AST: 33 U/L (ref 0–37)
CO2: 25 mEq/L (ref 19–32)
Calcium: 9 mg/dL (ref 8.4–10.5)
Creatinine, Ser: 0.99 mg/dL (ref 0.50–1.35)
GFR calc non Af Amer: 82 mL/min — ABNORMAL LOW (ref >90)

## 2011-03-23 LAB — CARDIAC PANEL(CRET KIN+CKTOT+MB+TROPI)
Relative Index: 2.6 — ABNORMAL HIGH (ref 0.0–2.5)
Total CK: 172 U/L (ref 7–232)
Troponin I: <0.3 ng/mL (ref <0.30)

## 2011-03-25 NOTE — Consult Note (Signed)
NAME:  [name], [name] NO.:  [id number]  MEDICAL RECORD NO.:  [id number]  LOCATION:  5004                         FACILITY:  [facility]  PHYSICIAN:  Conni Slipper, MDDATE OF BIRTH:  12/21/1940  DATE OF CONSULTATION:  03/23/2011 DATE OF DISCHARGE:                                CONSULTATION   Billy Lopez is a 70 year old African American male, who was admitted to the [facility] from the emergency department for the chest pain and then he was transferred to the medical floor.  The patient reported that he has been suffering with heroin abuse and also marijuana abuse since March 2011.  The patient also suffering with HIV and his CD4 count is 420.  He has been diagnosed with HIV since 1983. He also is suffering with hepatitis C, hepatitis B.  The patient has chest pain for 1 week.  The patient also reported that he has a history of using drugs when he was 69 years old and quit when he was 70 years old.  The patient reported that he had HIV contacted from a girlfriend in [place].  The patient reported that he has been living in [place] since 1983, and working in a [employer].  He stated that he knows multiple languages.  He has been working as a Runner, broadcasting/film/video. The patient reportedly went to a friend's birthday party where there is drinking which he started drinking and then started smoking weed.  The patient reported which he later started snorting heroin which started as a little bit at a time, which increased to 4 packs a day.  The patient reported that he does not drink.  The patient reported he has in his family drinking and doing drugs, and he also has some rough time with his ex-wife for the last 3 years.  The patient reportedly has a 102 or 69 years old daughter who lives with his ex-wife.  He has 2 other children who were 23 and 3 years old, grown ups, living in out of the state. The patient reported early seeking for  the detoxification and also motivated to get treatment [facility] and attending to the A and A meetings upon detox completed.  The patient has no history of psychiatric illness or previous Detox Services in [place].  MEDICAL HISTORY:  The patient has noncardiac chest pain, HIV, hepatitis C, hepatitis B, and hypertension.  SOCIAL HISTORY:  He stated that he has been living all by himself in an apartment, and he has been working as a Runner, broadcasting/film/video.  He has a 3 children, the youngest one is 27 years old.  MENTAL STATUS:  He appeared as per his stated age.  He has had clean shaved and wearing glasses.  He has hearing aid which he forgot at home, so need to talk a little loud or he has to read the lips.  He stated his mood is fine.  His affect was appropriate.  He has normal speech and thought process.  He has no suicidal or homicidal ideations.  The patient reportedly suffered with nausea, runny nose, diarrhea when he was not given medication like methadone.  The patient has no evidence of psychotic symptoms.  ASSESSMENT:  AXIS I:  Opioid withdrawal and opioid dependence, marijuana abuse. AXIS II:  Deferred. AXIS III:  HIV, hepatitis C, hepatitis B, hypertension, noncardiac chest pain. AXIS IV:  He has a family but is being alone, is separated from his ex- wife, being in contact with all his children.  The patient is motivated to stay clean from the drugs and continue functioning as a Runner, broadcasting/film/video. AXIS V:  GAF was 45.  TREATMENT PLAN:  I would like to start him on opioid withdrawal protocol.  We will bring the protocol from the [facility] and start as soon as today.  He may continue his medications at this time.  We will follow up with him as needed.  The patient is willing to follow up with [facility] upon discharge and also willing to follow up with Narcotics Anonymous.  Please refer him to these Social Services for appropriate  hookups.  I appreciated the consult with Sharlyn Bologna.     Conni Slipper, MD     JRJ/MEDQ  D:  03/23/2011  T:  03/23/2011  Job:  [id number]  Electronically Signed by Leata Mouse MD on 03/25/2011 03:10:45 PM

## 2011-03-26 ENCOUNTER — Other Ambulatory Visit: Payer: Self-pay | Admitting: *Deleted

## 2011-03-26 DIAGNOSIS — B20 Human immunodeficiency virus [HIV] disease: Secondary | ICD-10-CM

## 2011-03-26 MED ORDER — ENSURE PO LIQD: 1.0000 | Freq: Two times a day (BID) | ORAL | Status: DC

## 2011-03-26 NOTE — Telephone Encounter (Signed)
Pt was concerned that he has not rec'd his ensure. Asked that the rx be re-faxed to [phone]. done

## 2011-04-06 NOTE — Discharge Summary (Signed)
  NAME:  Billy, Lopez NO.:  [id number]  MEDICAL RECORD NO.:  [id number]  LOCATION:  5004                         FACILITY:  [facility]  PHYSICIAN:  Doneen Poisson, MD     DATE OF BIRTH:  1940-07-24  DATE OF ADMISSION:  03/22/2011 DATE OF DISCHARGE:  03/24/2011                              DISCHARGE SUMMARY ADDENDUM  ADDENDUM  DISCHARGE MEDICATIONS:  Clonidine 0.1 mg, take 4 tablets by mouth on day #1, 3 tabs by mouth on day #2, 2 tabs by mouth on day #3, 1 tab by mouth on day #4, and then stop.  The patient's following medications were held at the time of discharge: 1. Methadone 20 mg p.o. daily. 2. Loperamide 2 mg capsule. 3. Hydroxyzine 25 mg. 4. Bentyl 20 mg tablet.  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 Elsie Saas.  He was given information on outpatient detox.  He was advised to come to the ER if he experiences symptoms of withdrawal including nausea, vomiting, diarrhea, or muscle cramps.  He voiced clear understanding.   ______________________________ Elyse Jarvis, MD   ______________________________ Doneen Poisson, MD   MS/MEDQ  D:  04/01/2011  T:  04/01/2011  Job:  [id number]  Electronically Signed by Elyse Jarvis MD on 04/04/2011 04:06:35 PM Electronically Signed by Doneen Poisson  on 04/06/2011 10:54:25 AM

## 2011-04-06 NOTE — Discharge Summary (Signed)
NAME:  LANARD, ARGUIJO NO.:  [id number]  MEDICAL RECORD NO.:  [id number]  LOCATION:  5004                         FACILITY:  [facility]  PHYSICIAN:  Doneen Poisson, MD     DATE OF BIRTH:  Sep 22, 1940  DATE OF ADMISSION:  03/22/2011 DATE OF DISCHARGE:  03/24/2011                              DISCHARGE SUMMARY   DISCHARGE DIAGNOSES: 1. Atypical chest pain, likely muscle spasms from heroin withdrawal.     a. Cardiac etiology ruled out with negative cardiac enzymes and EKG. 2. Heroin withdrawal 3. Human immunodeficiency virus with last CD4 count 400 and viral     load less than 20 copies from July 2012. 4. Hepatitis C with viral load of 305,000 in April 2012. 5. Hypertension.  DISCHARGE MEDS WITH ACCURATE DOSES: 1. Aspirin 81 mg, take 1 tablet by mouth daily. 2. Atripla, take 1 tablet by mouth daily. 3. Claritin 10 mg, take 1 tablet by mouth daily as needed for     allergies. 4. Enalapril 20 mg, take 1 tablet by mouth daily. 5. Hydrochlorothiazide 25 mg, take 1 tablet by mouth daily. 6. Multivitamins therapeutic, take 1 tablet by mouth daily. 7. Naproxen 500 mg, take 1 tablet by mouth twice daily as needed for     pain. 8. Protonix 40 mg, take 1 tablet by mouth daily. 9. Tramadol 50 mg, take 1 tablet by mouth twice daily as needed for pain. 10.Viagra 50 mg, take 1 tablet by mouth daily as needed for erectile dysfunction.  Medicines started as a part of opiate withdrawal protocol include:  1. Loperamide 2 mg capsule, take 2-4 mg by mouth as needed. 2. Hydroxyzine 25 mg capsule, take 1 tablet by mouth every 6 hours as     needed. 3. Bentyl 20 mg tablet, take 1 tablet by mouth every 4 hours as     needed. 4. Clonidine 0.1 mg tablet, take 1 tablet by mouth 4 times a day.  He was also started him on Methadone 20 mg, by mouth daily.  DISPOSITION AND FOLLOWUP:  Mr. Stracener was transferred from [facility] to [facility] in stable and improved condition  on March 24, 2011 for inpatient detox from heroin withdrawal.  He was also evaluated in-house by Dr. Elsie Saas who agreed with transfer to [facility] for further management.  He was advised to follow up with outpatient detox at his discharge and to  schedule a follow-up appointment in 1-2 weeks with his PCP Traci Sermon in [facility] and a follow-up CXR in 4-6 weeks to assure resolution of the right basilar heterogeneous opacity.  PROCEDURES PERFORMED:  Chest x-ray on March 22, 2011 showed a right basilar heterogenous opacity, reason for infection/aspiration.  CONSULTATION:  Dr. Elsie Saas with Behavioral [facility].  BRIEF ADMITTING HISTORY AND PHYSICAL: Mr. Chavira is a 70 year old  man with a past medical history significant for heroin abuse, HIV,  hepatitis C, and prior admissions for noncardiac chest pain who  presents with chest pain for 1 week.  He states that he started  having pain about a week ago.  He was walking and started having pain in his hips which spread to his shoulders,  back, chest, and neck.   He states the pain has been on and off and feels like aching and an  electric shock.  He was dizzy, short of breath, and had some nausea  but no vomiting.  He is unsure how long the episodes last.  He feels  better when he sits down and is worse with walking.  The pain also  radiates down to both arms and is 7/10 when it is the worst.  Mr.  Dolley was seen by Dr. Daiva Eves prior to his ED referal and was given  aspirin and sublingual nitroglycerin, which helped relieve his pain.   He thinks his pain is also likely related to his current heroin abuse and is typical of his withdrawal symptoms.  PHYSICAL EXAMINATION: VITAL SIGNS:  Temperature 98.8, pulse 57, blood pressure 172/101,  respiratory rate 20, O2 sats 98% on room air. GENERAL:  Well developed, well nourished, in no acute distress, alert, appropriate, and cooperative to exam. HEENT:  Normocephalic,  atraumatic.  Pupils equal, round, reactive to light.  No signs of anemia or jaundice.  Moist mucous membranes,  noninflamed and nonerythematous. LUNGS:  Clear to auscultation bilaterally without crackles or wheezes. HEART:  Regular rate and rhythm.  No murmurs, rubs, or gallops. ABDOMEN:  Soft, nondistended, bowel sounds normoactive, tender to palpation around the umbilicus. EXTREMITIES:  No pretibial edema. NEUROLOGIC:  Alert and oriented x 3, cranial nerves II-XII intact.  Motor strength 5/5 bilaterally in all 4 extremities, sensation is intact to light touch.  LABS ON ADMISSION:  Sodium 138, potassium 4.4, chloride 107, bicarb 24, glucose 90, BUN 18, creatinine 0.88, calcium 9.2, white blood cell count 3, hemoglobin 13.1, hematocrit 39, platelets 156,000.  UDS positive for opiates  and marijuana.  HOSPITAL COURSE BY PROBLEM: 1. Atypical chest pain.  Mr. Gatliff presents with a history of     intermittent chest pain radiating to the bilateral shoulders, arms,     back, and neck for the last 1 week, which is exertional in nature,     and associated with nausea.  He  admits to almost daily heroin     abuse.  The differential includes muscle spasms from heroin      withdrawal, ACS, GERD, or esophageal spasms.  We think the pain was      likely spasm related from his heroin withdrawal as he ruled out for     an acute coronary syndromewith serial cardiac enzymes  x3 and ECGs.     He was pain free at the time of discharge.  2. Heroin withdrawal.  Mr. Carattini admits to a history of daily heroin     abuse and was complaining of some nausea and spasmodic pain which      was consistent with his withdrawal symptoms.  Behavioral health was      asked if he was a candidate for inpatient detox and he was evaluated     by Dr. Elsie Saas who started him on a heroin withdrawal protocol.       He was also started on methadone to assist in the opiate withdrawl.       After "clearing" Mr. Brothers  from a medical standpoint, he was      transfer to [facility] for further detox.  3. HIV.  He was continued on home medication of Atripla.  4. Hypertension.  His blood pressure was stable and he was continued      on his home meds of HCTZ  and enalapril.  DISCHARGE VITALS:  Temperature 98.6, pulse 52, respiratory rate 16,  blood pressure 139/89, O2 sats 98% on room air.  DISCHARGE LABS:  White blood cell count 4, hemoglobin 13.2, hematocrit  39.3, platelets 158,000.  Sodium 139, potassium 4.1, chloride 107, bicarb  25, glucose 95, BUN 22, creatinine 0.99, total bilirubin 0.1, alk phos 98,  SGOT 33, SGPT 30.   ______________________________ Elyse Jarvis, MD   ______________________________ Doneen Poisson, MD   MS/MEDQ  D:  03/24/2011  T:  03/24/2011  Job:  [id number]  cc:   Talmadge Chad, NP  Electronically Signed by Elyse Jarvis MD on 03/31/2011 07:58:30 PM Electronically Signed by Doneen Poisson  on 04/06/2011 10:52:16 AM

## 2011-05-13 ENCOUNTER — Telehealth: Payer: Self-pay | Admitting: *Deleted

## 2011-05-13 NOTE — Telephone Encounter (Signed)
HCTZ no longer on pt's medication profile.  Pt will need to be seen by MD to obtain rx.  Faxed back rx with note for pt to call [facility] for labwork and MD appts.

## 2011-05-15 ENCOUNTER — Other Ambulatory Visit: Payer: Self-pay | Admitting: Internal Medicine

## 2011-05-15 DIAGNOSIS — R52 Pain, unspecified: Secondary | ICD-10-CM

## 2011-05-15 DIAGNOSIS — I10 Essential (primary) hypertension: Secondary | ICD-10-CM

## 2011-05-15 DIAGNOSIS — B20 Human immunodeficiency virus [HIV] disease: Secondary | ICD-10-CM

## 2011-05-15 MED ORDER — HYDROCHLOROTHIAZIDE 25 MG PO TABS: 25.0000 mg | ORAL_TABLET | Freq: Every day | ORAL | Status: DC

## 2011-05-15 MED ORDER — TRAMADOL HCL 50 MG PO TABS: 50.0000 mg | ORAL_TABLET | Freq: Four times a day (QID) | ORAL | Status: DC | PRN

## 2011-05-22 ENCOUNTER — Ambulatory Visit (INDEPENDENT_AMBULATORY_CARE_PROVIDER_SITE_OTHER): Payer: PRIVATE HEALTH INSURANCE | Admitting: *Deleted

## 2011-05-22 ENCOUNTER — Other Ambulatory Visit (INDEPENDENT_AMBULATORY_CARE_PROVIDER_SITE_OTHER): Payer: PRIVATE HEALTH INSURANCE | Admitting: Infectious Diseases

## 2011-05-22 ENCOUNTER — Other Ambulatory Visit: Payer: Self-pay | Admitting: Infectious Diseases

## 2011-05-22 ENCOUNTER — Other Ambulatory Visit: Payer: PRIVATE HEALTH INSURANCE

## 2011-05-22 DIAGNOSIS — B20 Human immunodeficiency virus [HIV] disease: Secondary | ICD-10-CM

## 2011-05-22 DIAGNOSIS — Z23 Encounter for immunization: Secondary | ICD-10-CM

## 2011-05-22 LAB — COMPREHENSIVE METABOLIC PANEL
ALT: 35 U/L (ref 0–53)
Albumin: 3.8 g/dL (ref 3.5–5.2)
CO2: 25 mEq/L (ref 19–32)
Chloride: 106 mEq/L (ref 96–112)
Glucose, Bld: 85 mg/dL (ref 70–99)
Potassium: 5 mEq/L (ref 3.5–5.3)
Sodium: 136 mEq/L (ref 135–145)
Total Protein: 6.7 g/dL (ref 6.0–8.3)

## 2011-05-23 LAB — CBC WITH DIFFERENTIAL/PLATELET
Hemoglobin: 14 g/dL (ref 13.0–17.0)
Lymphocytes Relative: 38 % (ref 12–46)
Lymphs Abs: 1 10*3/uL (ref 0.7–4.0)
MCH: 29.2 pg (ref 26.0–34.0)
Monocytes Relative: 18 % — ABNORMAL HIGH (ref 3–12)
Neutro Abs: 1 10*3/uL — ABNORMAL LOW (ref 1.7–7.7)
Neutrophils Relative %: 39 % — ABNORMAL LOW (ref 43–77)
RBC: 4.8 MIL/uL (ref 4.22–5.81)
WBC: 2.6 10*3/uL — ABNORMAL LOW (ref 4.0–10.5)

## 2011-05-27 LAB — HIV-1 RNA QUANT-NO REFLEX-BLD
HIV 1 RNA Quant: <20 copies/mL (ref <20)
HIV-1 RNA Quant, Log: <1.3 {Log} (ref <1.30)

## 2011-06-06 ENCOUNTER — Other Ambulatory Visit: Payer: Self-pay | Admitting: *Deleted

## 2011-06-06 DIAGNOSIS — B20 Human immunodeficiency virus [HIV] disease: Secondary | ICD-10-CM

## 2011-06-06 MED ORDER — EFAVIRENZ-EMTRICITAB-TENOFOVIR 600-200-300 MG PO TABS: 1.0000 | ORAL_TABLET | Freq: Every day | ORAL | Status: DC

## 2011-06-06 NOTE — Telephone Encounter (Signed)
Left message for pt that I have refilled his med. His home # is [phone]

## 2011-06-07 ENCOUNTER — Telehealth: Payer: Self-pay | Admitting: *Deleted

## 2011-06-07 NOTE — Telephone Encounter (Signed)
Called patient about meds as he as left messages was unable to get the patient by phone or leave a message as no voice mail message played.

## 2011-06-07 NOTE — Telephone Encounter (Signed)
Patient called back and he already has the medication. He forgot to check with the pharmacy to see if it was there yet. So he no longer needs [place].

## 2011-06-18 ENCOUNTER — Encounter: Payer: Self-pay | Admitting: Internal Medicine

## 2011-06-18 ENCOUNTER — Ambulatory Visit (INDEPENDENT_AMBULATORY_CARE_PROVIDER_SITE_OTHER): Payer: Medicaid Other | Admitting: Internal Medicine

## 2011-06-18 VITALS — BP 148/89 | HR 67 | Temp 98.2°F | Ht 71.0 in | Wt 166.0 lb

## 2011-06-18 DIAGNOSIS — Z23 Encounter for immunization: Secondary | ICD-10-CM

## 2011-06-18 DIAGNOSIS — Z21 Asymptomatic human immunodeficiency virus [HIV] infection status: Secondary | ICD-10-CM

## 2011-06-18 DIAGNOSIS — B20 Human immunodeficiency virus [HIV] disease: Secondary | ICD-10-CM

## 2011-06-18 DIAGNOSIS — Z113 Encounter for screening for infections with a predominantly sexual mode of transmission: Secondary | ICD-10-CM

## 2011-06-18 DIAGNOSIS — I10 Essential (primary) hypertension: Secondary | ICD-10-CM

## 2011-06-18 DIAGNOSIS — Z79899 Other long term (current) drug therapy: Secondary | ICD-10-CM

## 2011-06-18 MED ORDER — HYDROCHLOROTHIAZIDE 25 MG PO TABS: 25.0000 mg | ORAL_TABLET | Freq: Every day | ORAL | Status: DC

## 2011-06-18 MED ORDER — ENALAPRIL MALEATE 20 MG PO TABS: 20.0000 mg | ORAL_TABLET | Freq: Every day | ORAL | Status: DC

## 2011-06-18 MED ORDER — SILDENAFIL CITRATE 50 MG PO TABS: 50.0000 mg | ORAL_TABLET | ORAL | Status: DC

## 2011-06-18 NOTE — Patient Instructions (Signed)
Continue taking your medicines.

## 2011-06-18 NOTE — Progress Notes (Signed)
  Subjective:    Patient ID: Billy Lopez, male    DOB: 02/14/1941, 71 y.o.   MRN: [id number]  HPI here for follow up for his 042.  He was last seen for a visit and had significant chest pain and was admitted to the hospital.  Most of the episode was related to drug use, which the patient acknowledges and has been drug free since.  In fact, he tells me he is reconciling with his wife and has been behaving well.  He continues to take his Atripla well and has had no missed doses by his report.  He does have some issues with erectile dysfunction and is requesting Viagra.  He has no other complaints.      Review of Systems  Constitutional: Negative for fever, chills and unexpected weight change.  HENT: Negative for sore throat and trouble swallowing.   Respiratory: Negative for cough and shortness of breath.   Cardiovascular: Negative for chest pain, palpitations and leg swelling.  Gastrointestinal: Negative for nausea, vomiting, abdominal pain and diarrhea.  Genitourinary: Negative for discharge and genital sores.  Musculoskeletal: Negative for myalgias and arthralgias.  Skin: Negative for rash.  Neurological: Negative for light-headedness and headaches.  Hematological: Negative for adenopathy.  Psychiatric/Behavioral: Negative for dysphoric mood. The patient is not nervous/anxious.        Objective:   Physical Exam  Constitutional: He is oriented to person, place, and time. He appears well-developed and well-nourished. No distress.  HENT:  Mouth/Throat: Oropharynx is clear and moist. No oropharyngeal exudate.  Eyes: No scleral icterus.  Cardiovascular: Normal rate, regular rhythm and normal heart sounds.  Exam reveals no gallop and no friction rub.   No murmur heard. Pulmonary/Chest: Effort normal and breath sounds normal. No respiratory distress. He has no wheezes. He has no rales.  Abdominal: Soft. Bowel sounds are normal. He exhibits no distension. There is no tenderness. There is no  rebound.  Lymphadenopathy:    He has no cervical adenopathy.  Neurological: He is alert and oriented to person, place, and time.  Skin: Skin is warm and dry. No rash noted. No erythema.  Psychiatric: He has a normal mood and affect. His behavior is normal.          Assessment & Plan:

## 2011-06-19 ENCOUNTER — Telehealth: Payer: Self-pay | Admitting: *Deleted

## 2011-06-19 NOTE — Telephone Encounter (Signed)
He left a message that was garbled. I think he said he did not have a pcp & then something about a RX. I LM asking him to call back & press 3

## 2011-06-19 NOTE — Telephone Encounter (Signed)
Called patient and notified him of appointment with Dr. Oliver Barre at [facility].  Dr. Luciana Axe wanted him established with a PCP.  His appointment is on 07/26/11 at 1:00 pm.  He will be receiving a new patient packet. Wendall Mola CMA

## 2011-06-19 NOTE — Telephone Encounter (Signed)
Called and left message to find out if patient had a PCP.  If not I will refer him. Wendall Mola CMA

## 2011-07-05 ENCOUNTER — Other Ambulatory Visit: Payer: Self-pay | Admitting: Internal Medicine

## 2011-07-05 DIAGNOSIS — B20 Human immunodeficiency virus [HIV] disease: Secondary | ICD-10-CM

## 2011-07-05 MED ORDER — NAPROXEN 500 MG PO TABS: 500.0000 mg | ORAL_TABLET | Freq: Two times a day (BID) | ORAL | Status: DC

## 2011-07-05 MED ORDER — EFAVIRENZ-EMTRICITAB-TENOFOVIR 600-200-300 MG PO TABS: 1.0000 | ORAL_TABLET | Freq: Every day | ORAL | Status: DC

## 2011-07-08 ENCOUNTER — Other Ambulatory Visit: Payer: Self-pay | Admitting: *Deleted

## 2011-07-08 DIAGNOSIS — R52 Pain, unspecified: Secondary | ICD-10-CM

## 2011-07-08 MED ORDER — TRAMADOL HCL 50 MG PO TABS: 50.0000 mg | ORAL_TABLET | Freq: Four times a day (QID) | ORAL | Status: DC | PRN

## 2011-07-15 NOTE — Assessment & Plan Note (Signed)
His problems with low back pain seem to be chronic in nature. However, he has not performed routine measures that would help improve this. We recommend that he continue his naproxen for now, she'll use a lumbar, sacral support, body mechanics, good posture exercises, and side-lying with pillow support at bedtime.

## 2011-07-15 NOTE — Progress Notes (Signed)
Subjective:    Patient ID: Billy Lopez is a 71 y.o. male.  Chief Complaint: HIV Follow-up Visit [name] is here for follow-up of HIV infection. He is feeling unchanged since his last visit.  He claims continued adherence to therapy with good tolerance and no complications. There are additional complaints. Still remains with chronic low back pain. When questioned about what he does to treat the back pain. No routine measures, such as warm packs, lumbar supports, side-lying, leg elevation, body mechanics, or posture exercises were mentioned. He states that mostly he takes his naproxen. Denies radiculopathy, numbness or tingling to extremities, but does state he has difficulty walking.  Data Review: Diagnostic studies reviewed.  Review of Systems - General ROS: negative for - fatigue, fever, malaise or night sweats Psychological ROS: negative for - anxiety, behavioral disorder, concentration difficulties, depression, memory difficulties or mood swings Ophthalmic ROS: negative ENT ROS: negative Respiratory ROS: no cough, shortness of breath, or wheezing Cardiovascular ROS: no chest pain or dyspnea on exertion Gastrointestinal ROS: no abdominal pain, change in bowel habits, or black or bloody stools Genito-Urinary ROS: no dysuria, trouble voiding, or hematuria Musculoskeletal ROS: See history of present illness Neurological ROS: no TIA or stroke symptoms Dermatological ROS: negative for pruritus, rash and skin lesion changes  Objective:   General appearance: alert, cooperative and no distress Head: Normocephalic, without obvious abnormality, atraumatic Eyes: conjunctivae/corneas clear. PERRL, EOM's intact. Fundi benign. Throat: lips, mucosa, and tongue normal; teeth and gums normal Back: no kyphosis present, no scoliosis present, no tenderness to percussion or palpation, range of motion normal, Some limited range of motion, especially flexion, side-to-side flexion, forward. Resp:  clear to auscultation bilaterally Cardio: regular rate and rhythm, S1, S2 normal, no murmur, click, rub or gallop GI: soft, non-tender; bowel sounds normal; no masses,  no organomegaly Extremities: extremities normal, atraumatic, no cyanosis or edema Skin: Skin color, texture, turgor normal. No rashes or lesions Neurologic: Sensory: normal Psych:  No vegetative signs or delusional behaviors noted.    Laboratory: From 12/19/2010 ,  CD4 count was 400 c/cmm @ 31 %. Viral load <20 copies/ml.     Assessment/Plan:   HIV DISEASE Clinically stable on current regimen. Continue present management.  Counseling provided on prevention of transmission of HIV. Condoms offered:  Yes Medication adherence discussed with patient. Referrals: None Follow up visit in 4 months with labs 2 weeks prior to appointment. Patient verbally acknowledged information provided to them and agreed with plan of care.   LOW BACK PAIN SYNDROME His problems with low back pain seem to be chronic in nature. However, he has not performed routine measures that would help improve this. We recommend that he continue his naproxen for now, she'll use a lumbar, sacral support, body mechanics, good posture exercises, and side-lying with pillow support at bedtime.     Svetlana Bagby A. Sundra Aland, MS, [facility] for Infectious Disease [phone]  07/15/2011, 10:25 AM

## 2011-07-15 NOTE — Assessment & Plan Note (Signed)
Clinically stable on current regimen. Continue present management.  Counseling provided on prevention of transmission of HIV. Condoms offered:  Yes Medication adherence discussed with patient. Referrals: None Follow up visit in 4 months with labs 2 weeks prior to appointment. Patient verbally acknowledged information provided to them and agreed with plan of care.  

## 2011-07-21 ENCOUNTER — Encounter: Payer: Self-pay | Admitting: Internal Medicine

## 2011-07-21 DIAGNOSIS — F1123 Opioid dependence with withdrawal: Secondary | ICD-10-CM

## 2011-07-21 DIAGNOSIS — J449 Chronic obstructive pulmonary disease, unspecified: Secondary | ICD-10-CM

## 2011-07-21 DIAGNOSIS — M47812 Spondylosis without myelopathy or radiculopathy, cervical region: Secondary | ICD-10-CM

## 2011-07-21 DIAGNOSIS — J309 Allergic rhinitis, unspecified: Secondary | ICD-10-CM

## 2011-07-21 DIAGNOSIS — M1990 Unspecified osteoarthritis, unspecified site: Secondary | ICD-10-CM | POA: Insufficient documentation

## 2011-07-21 DIAGNOSIS — M47816 Spondylosis without myelopathy or radiculopathy, lumbar region: Secondary | ICD-10-CM

## 2011-07-21 DIAGNOSIS — Z0000 Encounter for general adult medical examination without abnormal findings: Secondary | ICD-10-CM | POA: Insufficient documentation

## 2011-07-21 DIAGNOSIS — F1193 Opioid use, unspecified with withdrawal: Secondary | ICD-10-CM | POA: Insufficient documentation

## 2011-07-21 HISTORY — DX: Opioid dependence with withdrawal: F11.23

## 2011-07-21 HISTORY — DX: Unspecified osteoarthritis, unspecified site: M19.90

## 2011-07-21 HISTORY — DX: Spondylosis without myelopathy or radiculopathy, lumbar region: M47.816

## 2011-07-21 HISTORY — DX: Allergic rhinitis, unspecified: J30.9

## 2011-07-21 HISTORY — DX: Chronic obstructive pulmonary disease, unspecified: J44.9

## 2011-07-21 HISTORY — DX: Opioid use, unspecified with withdrawal: F11.93

## 2011-07-21 HISTORY — DX: Spondylosis without myelopathy or radiculopathy, cervical region: M47.812

## 2011-07-26 ENCOUNTER — Ambulatory Visit (INDEPENDENT_AMBULATORY_CARE_PROVIDER_SITE_OTHER): Payer: PRIVATE HEALTH INSURANCE | Admitting: Internal Medicine

## 2011-07-26 ENCOUNTER — Other Ambulatory Visit (INDEPENDENT_AMBULATORY_CARE_PROVIDER_SITE_OTHER): Payer: PRIVATE HEALTH INSURANCE

## 2011-07-26 ENCOUNTER — Other Ambulatory Visit: Payer: Self-pay | Admitting: Internal Medicine

## 2011-07-26 ENCOUNTER — Encounter: Payer: Self-pay | Admitting: Internal Medicine

## 2011-07-26 VITALS — BP 132/90 | HR 70 | Temp 98.3°F | Ht 71.0 in | Wt 163.2 lb

## 2011-07-26 DIAGNOSIS — H9192 Unspecified hearing loss, left ear: Secondary | ICD-10-CM | POA: Insufficient documentation

## 2011-07-26 DIAGNOSIS — Z0000 Encounter for general adult medical examination without abnormal findings: Secondary | ICD-10-CM

## 2011-07-26 DIAGNOSIS — R52 Pain, unspecified: Secondary | ICD-10-CM

## 2011-07-26 DIAGNOSIS — R972 Elevated prostate specific antigen [PSA]: Secondary | ICD-10-CM

## 2011-07-26 DIAGNOSIS — G8929 Other chronic pain: Secondary | ICD-10-CM | POA: Insufficient documentation

## 2011-07-26 DIAGNOSIS — H9190 Unspecified hearing loss, unspecified ear: Secondary | ICD-10-CM

## 2011-07-26 DIAGNOSIS — Z125 Encounter for screening for malignant neoplasm of prostate: Secondary | ICD-10-CM

## 2011-07-26 DIAGNOSIS — Z79899 Other long term (current) drug therapy: Secondary | ICD-10-CM

## 2011-07-26 HISTORY — DX: Unspecified hearing loss, left ear: H91.92

## 2011-07-26 LAB — URINALYSIS, ROUTINE W REFLEX MICROSCOPIC
Bilirubin Urine: NEGATIVE
Hgb urine dipstick: NEGATIVE
Leukocytes, UA: NEGATIVE
Nitrite: NEGATIVE
Urobilinogen, UA: 0.2 (ref 0.0–1.0)

## 2011-07-26 LAB — CBC WITH DIFFERENTIAL/PLATELET
Hemoglobin: 14.8 g/dL (ref 13.0–17.0)
MCHC: 33.5 g/dL (ref 30.0–36.0)
RDW: 14.1 % (ref 11.5–14.6)

## 2011-07-26 LAB — LIPID PANEL
HDL: 56.1 mg/dL (ref >39.00)
Total CHOL/HDL Ratio: 3
Triglycerides: 129 mg/dL (ref 0.0–149.0)

## 2011-07-26 LAB — BASIC METABOLIC PANEL
CO2: 27 mEq/L (ref 19–32)
Calcium: 9.4 mg/dL (ref 8.4–10.5)
Chloride: 102 mEq/L (ref 96–112)
Creatinine, Ser: 1.2 mg/dL (ref 0.4–1.5)
Glucose, Bld: 88 mg/dL (ref 70–99)
Sodium: 135 mEq/L (ref 135–145)

## 2011-07-26 LAB — HEPATIC FUNCTION PANEL
ALT: 58 U/L — ABNORMAL HIGH (ref 0–53)
Albumin: 4.1 g/dL (ref 3.5–5.2)
Alkaline Phosphatase: 102 U/L (ref 39–117)
Bilirubin, Direct: 0.1 mg/dL (ref 0.0–0.3)
Total Protein: 7.7 g/dL (ref 6.0–8.3)

## 2011-07-26 LAB — TSH: TSH: 2.96 u[IU]/mL (ref 0.35–5.50)

## 2011-07-26 MED ORDER — TRAMADOL HCL 50 MG PO TABS: 50.0000 mg | ORAL_TABLET | Freq: Four times a day (QID) | ORAL | Status: DC | PRN

## 2011-07-26 NOTE — Assessment & Plan Note (Signed)
Overall doing well, age appropriate education and counseling updated, referrals for preventative services and immunizations addressed, dietary and smoking counseling addressed, most recent labs and ECG reviewed.  I have personally reviewed and have noted: 1) the patient's medical and social history 2) The pt's use of alcohol, tobacco, and illicit drugs 3) The patient's current medications and supplements 4) Functional ability including ADL's, fall risk, home safety risk, hearing and visual impairment 5) Diet and physical activities 6) Evidence for depression or mood disorder 7) The patient's height, weight, and BMI have been recorded in the chart I have made referrals, and provided counseling and education based on review of the above For colonscopy as he is due

## 2011-07-26 NOTE — Patient Instructions (Addendum)
Continue all other medications as before; your tramadol was refilled today Please have the pharmacy call if you need refills of your medication Please go to LAB in the Basement for the blood and/or urine tests to be done today Please call the phone number [phone] (the PhoneTree System) for results of testing in 2-3 days;  When calling, simply dial the number, and when prompted enter the MRN number above (the Medical Record Number) and the # key, then the message should start. You will be contacted regarding the referral for: colonoscopy Please return in 6 months, or sooner if needed

## 2011-07-26 NOTE — Progress Notes (Signed)
Subjective:    Patient ID: Billy Lopez, male    DOB: [birth year], 71 y.o.   MRN: [id number]  HPI  Here for wellness and f/u;  Overall doing ok;  Pt denies CP, worsening SOB, DOE, wheezing, orthopnea, PND, worsening LE edema, palpitations, dizziness or syncope.  Pt denies neurological change such as new Headache, facial or extremity weakness.  Pt denies polydipsia, polyuria, or low sugar symptoms. Pt states overall good compliance with treatment and medications, good tolerability, and trying to follow lower cholesterol diet.  Pt denies worsening depressive symptoms, suicidal ideation or panic. No fever, wt loss, night sweats, loss of appetite, or other constitutional symptoms.  Pt states good ability with ADL's, low fall risk, home safety reviewed and adequate, no significant changes in hearing or vision, and occasionally active with exercise.  C/o pain to shoulders and hips for at least 7 yrs since the accident.  Asks for hydrocodone.   Past Medical History  Diagnosis Date  . Hypertension   . HIV infection   . Substance abuse   . 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 Allergic rhinitis, cause unspecified 07/21/2011  . Cervical spondylosis 07/21/2011  . Lumbar spondylosis 07/21/2011  . Osteoarthritis 07/21/2011  . COPD (chronic obstructive pulmonary disease) 07/21/2011  . Hearing loss on left 07/26/2011   Past Surgical History  Procedure Date  . Hip surgery     Replacement  . Hernia repair     reports that he has never smoked. He has never used smokeless tobacco. He reports that he uses illicit drugs (Heroin) about 7 times per week. He reports that he does not drink alcohol. family history includes Alcohol abuse in his other; Arthritis in his other; and Hypertension in his other. No Known Allergies Current Outpatient Prescriptions on File Prior to Visit  Medication Sig Dispense Refill  . efavirenz-emtrictabine-tenofovir (ATRIPLA) 600-200-300 MG per tablet Take 1 tablet by mouth at bedtime.  31 tablet   4  . enalapril (VASOTEC) 20 MG tablet Take 1 tablet (20 mg total) by mouth daily.  30 tablet  11  . ENSURE (ENSURE) Take 1 Can by mouth 2 (two) times daily.  237 mL  12  . hydrochlorothiazide (HYDRODIURIL) 25 MG tablet Take 1 tablet (25 mg total) by mouth daily.  30 tablet  11  . loratadine (CLARITIN) 10 MG tablet Take 10 mg by mouth daily.        . naproxen (NAPROSYN) 500 MG tablet Take 1 tablet (500 mg total) by mouth 2 (two) times daily.  60 tablet  5  . pantoprazole (PROTONIX) 40 MG tablet Take 40 mg by mouth daily.         Review of Systems Review of Systems  Constitutional: Negative for diaphoresis, activity change, appetite change and unexpected weight change.  HENT: Negative for hearing loss, ear pain, facial swelling, mouth sores and neck stiffness.   Eyes: Negative for pain, redness and visual disturbance.  Respiratory: Negative for shortness of breath and wheezing.   Cardiovascular: Negative for chest pain and palpitations.  Gastrointestinal: Negative for diarrhea, blood in stool, abdominal distention and rectal pain.  Genitourinary: Negative for hematuria, flank pain and decreased urine volume.  Musculoskeletal: Negative for myalgias and joint swelling.  Skin: Negative for color change and wound.  Neurological: Negative for syncope and numbness.  Hematological: Negative for adenopathy.  Psychiatric/Behavioral: Negative for hallucinations, self-injury, decreased concentration and agitation.      Objective:   Physical Exam BP 132/90  Pulse 70  Temp(Src) 98.3  F (36.8 C) (Oral)  Ht 5\' 11"  (1.803 m)  Wt 163 lb 4 oz (74.05 kg)  BMI 22.77 kg/m2  SpO2 97% Physical Exam  VS noted Constitutional: Pt is oriented to person, place, and time. Appears well-developed and well-nourished.  HENT:  Head: Normocephalic and atraumatic.  Right Ear: External ear normal.  Left Ear: External ear normal.  Nose: Nose normal.  Mouth/Throat: Oropharynx is clear and moist.  Eyes: Conjunctivae  and EOM are normal. Pupils are equal, round, and reactive to light.  Neck: Normal range of motion. Neck supple. No JVD present. No tracheal deviation present.  Cardiovascular: Normal rate, regular rhythm, normal heart sounds and intact distal pulses.   Pulmonary/Chest: Effort normal and breath sounds normal.  Abdominal: Soft. Bowel sounds are normal. There is no tenderness.  Musculoskeletal: Normal range of motion. Exhibits no edema.  Lymphadenopathy:  Has no cervical adenopathy.  Neurological: Pt is alert and oriented to person, place, and time. Pt has normal reflexes. No cranial nerve deficit.  Skin: Skin is warm and dry. No rash noted.  Psychiatric:  Has  normal mood and affect. Behavior is normal.     Assessment & Plan:

## 2011-07-26 NOTE — Assessment & Plan Note (Addendum)
Ok for tramadol prn, but I explainied to him I could not presribe higher potent narcotic due to his hx of abuse, would consider referral to pain clinic

## 2011-08-13 ENCOUNTER — Other Ambulatory Visit: Payer: Self-pay | Admitting: *Deleted

## 2011-08-13 DIAGNOSIS — B20 Human immunodeficiency virus [HIV] disease: Secondary | ICD-10-CM

## 2011-08-13 MED ORDER — ENSURE PO LIQD: 1.0000 | Freq: Two times a day (BID) | ORAL | Status: DC

## 2011-08-13 NOTE — Telephone Encounter (Signed)
Faxed to [facility]. 

## 2011-08-16 ENCOUNTER — Other Ambulatory Visit: Payer: Self-pay | Admitting: *Deleted

## 2011-08-16 DIAGNOSIS — Z7721 Contact with and (suspected) exposure to potentially hazardous body fluids: Secondary | ICD-10-CM

## 2011-09-02 ENCOUNTER — Other Ambulatory Visit: Payer: PRIVATE HEALTH INSURANCE

## 2011-09-02 ENCOUNTER — Other Ambulatory Visit (HOSPITAL_COMMUNITY)
Admission: RE | Admit: 2011-09-02 | Discharge: 2011-09-02 | Disposition: A | Discharge status: Home / Self Care | Payer: PRIVATE HEALTH INSURANCE | Source: Ambulatory Visit | Attending: Internal Medicine | Admitting: Internal Medicine

## 2011-09-02 DIAGNOSIS — Z113 Encounter for screening for infections with a predominantly sexual mode of transmission: Secondary | ICD-10-CM

## 2011-09-02 DIAGNOSIS — Z21 Asymptomatic human immunodeficiency virus [HIV] infection status: Secondary | ICD-10-CM

## 2011-09-02 DIAGNOSIS — Z79899 Other long term (current) drug therapy: Secondary | ICD-10-CM

## 2011-09-02 DIAGNOSIS — Z7721 Contact with and (suspected) exposure to potentially hazardous body fluids: Secondary | ICD-10-CM

## 2011-09-02 DIAGNOSIS — B20 Human immunodeficiency virus [HIV] disease: Secondary | ICD-10-CM

## 2011-09-02 LAB — CBC WITH DIFFERENTIAL/PLATELET
Basophils Relative: 2 % — ABNORMAL HIGH (ref 0–1)
Eosinophils Relative: 3 % (ref 0–5)
HCT: 44 % (ref 39.0–52.0)
Hemoglobin: 14.7 g/dL (ref 13.0–17.0)
Lymphocytes Relative: 31 % (ref 12–46)
MCHC: 33.4 g/dL (ref 30.0–36.0)
MCV: 88 fL (ref 78.0–100.0)
Monocytes Absolute: 0.6 10*3/uL (ref 0.1–1.0)
Monocytes Relative: 18 % — ABNORMAL HIGH (ref 3–12)
Neutro Abs: 1.5 10*3/uL — ABNORMAL LOW (ref 1.7–7.7)

## 2011-09-02 LAB — COMPREHENSIVE METABOLIC PANEL
Albumin: 4.1 g/dL (ref 3.5–5.2)
BUN: 25 mg/dL — ABNORMAL HIGH (ref 6–23)
Calcium: 9.7 mg/dL (ref 8.4–10.5)
Chloride: 103 mEq/L (ref 96–112)
Glucose, Bld: 61 mg/dL — ABNORMAL LOW (ref 70–99)
Potassium: 4 mEq/L (ref 3.5–5.3)

## 2011-09-02 LAB — LIPID PANEL
HDL: 53 mg/dL (ref >39)
Triglycerides: 83 mg/dL (ref <150)

## 2011-09-04 LAB — HIV-1 RNA QUANT-NO REFLEX-BLD: HIV-1 RNA Quant, Log: <1.3 {Log} (ref <1.30)

## 2011-09-16 ENCOUNTER — Ambulatory Visit (INDEPENDENT_AMBULATORY_CARE_PROVIDER_SITE_OTHER): Payer: Medicare Other | Admitting: Internal Medicine

## 2011-09-16 ENCOUNTER — Encounter: Payer: Self-pay | Admitting: Internal Medicine

## 2011-09-16 VITALS — BP 158/93 | HR 72 | Temp 98.5°F | Ht 71.0 in | Wt 163.0 lb

## 2011-09-16 DIAGNOSIS — B1710 Acute hepatitis C without hepatic coma: Secondary | ICD-10-CM

## 2011-09-16 DIAGNOSIS — B20 Human immunodeficiency virus [HIV] disease: Secondary | ICD-10-CM

## 2011-09-16 NOTE — Patient Instructions (Signed)
Increase your activity, exercise.

## 2011-09-16 NOTE — Assessment & Plan Note (Signed)
He continues to do well with no significant issues related to his HIV at this time. His CD4 count has remained around 300 and it does not appear that it will increase much more than that. However he is typically beyond the range of AIDS at this time and so no further intervention will be employed. M him to use condoms with all sexual activity and encouraged him to continue with his excellent compliance. He will return in 6 months for routine followup however was encouraged to call if he has any issues related to his HIV or the things we can be in assistance with.

## 2011-09-16 NOTE — Progress Notes (Signed)
  Subjective:    Patient ID: AH BOTT, male    DOB: Nov 24, 1940, 71 y.o.   MRN: [id number]  HPI comes in today for routine followup. He has been continuing on his regimen of Atripla and remains undetectable. His CD4 count is noted and is consistent with previous. He has been unable to get his CD4 count to increase however it has remained stable. Today he has no complaints including no complaint of new pain, no diarrhea, no rashes. He continues to take his medication with 100% compliance. He also continues to see his primary physician for his other medical issues.    Review of Systems  Constitutional: Negative for fever, chills, activity change, appetite change, fatigue and unexpected weight change.  HENT: Negative for sore throat and trouble swallowing.   Respiratory: Negative for cough, chest tightness and wheezing.   Cardiovascular: Negative for chest pain, palpitations and leg swelling.  Gastrointestinal: Negative for nausea, abdominal pain, diarrhea and constipation.  Genitourinary: Negative for discharge and genital sores.  Musculoskeletal: Negative for myalgias and joint swelling.  Skin: Negative for pallor and rash.  Neurological: Negative for dizziness and light-headedness.  Hematological: Negative for adenopathy.  Psychiatric/Behavioral: Negative for dysphoric mood. The patient is not nervous/anxious.        Objective:   Physical Exam  Constitutional: He appears well-developed and well-nourished. No distress.  HENT:  Mouth/Throat: Oropharynx is clear and moist. No oropharyngeal exudate.  Cardiovascular: Normal rate, regular rhythm and normal heart sounds.  Exam reveals no gallop and no friction rub.   No murmur heard. Pulmonary/Chest: Effort normal and breath sounds normal. No respiratory distress. He has no wheezes. He has no rales.  Abdominal: Soft. Bowel sounds are normal. He exhibits no distension. There is no tenderness. There is no rebound.  Lymphadenopathy:    He  has no cervical adenopathy.  Skin: No rash noted.          Assessment & Plan:

## 2011-09-16 NOTE — Assessment & Plan Note (Signed)
She was reminded to abstain from alcohol. There is no indication for treatment due to his age.

## 2011-09-19 DIAGNOSIS — C61 Malignant neoplasm of prostate: Secondary | ICD-10-CM

## 2011-09-19 HISTORY — DX: Malignant neoplasm of prostate: C61

## 2011-10-14 ENCOUNTER — Encounter: Payer: Self-pay | Admitting: Radiation Oncology

## 2011-10-14 ENCOUNTER — Ambulatory Visit
Admission: RE | Admit: 2011-10-14 | Discharge: 2011-10-14 | Disposition: A | Discharge status: Home / Self Care | Payer: PRIVATE HEALTH INSURANCE | Source: Ambulatory Visit | Attending: Radiation Oncology | Admitting: Radiation Oncology

## 2011-10-14 VITALS — BP 116/71 | HR 72 | Temp 98.6°F | Wt 160.9 lb

## 2011-10-14 DIAGNOSIS — I10 Essential (primary) hypertension: Secondary | ICD-10-CM | POA: Insufficient documentation

## 2011-10-14 DIAGNOSIS — J4489 Other specified chronic obstructive pulmonary disease: Secondary | ICD-10-CM | POA: Insufficient documentation

## 2011-10-14 DIAGNOSIS — Z21 Asymptomatic human immunodeficiency virus [HIV] infection status: Secondary | ICD-10-CM | POA: Insufficient documentation

## 2011-10-14 DIAGNOSIS — C61 Malignant neoplasm of prostate: Secondary | ICD-10-CM

## 2011-10-14 DIAGNOSIS — Z79899 Other long term (current) drug therapy: Secondary | ICD-10-CM | POA: Insufficient documentation

## 2011-10-14 DIAGNOSIS — Z510 Encounter for antineoplastic radiation therapy: Secondary | ICD-10-CM | POA: Insufficient documentation

## 2011-10-14 DIAGNOSIS — J449 Chronic obstructive pulmonary disease, unspecified: Secondary | ICD-10-CM | POA: Insufficient documentation

## 2011-10-14 NOTE — Progress Notes (Signed)
Radiation consultation for diagnosis of prostate cancer.Prostate biopsy on 09/19/11 reveals  Gleason 3+3=6 right lateral mid and right mid prostate. Prostate volume 31 cc's.elevated PSA 5.61 on 07/26/2011.

## 2011-10-14 NOTE — Progress Notes (Signed)
Please see the Nurse Progress Note in the MD Initial Consult Encounter for this patient. 

## 2011-10-15 ENCOUNTER — Encounter: Payer: Self-pay | Admitting: Radiation Oncology

## 2011-10-15 NOTE — Progress Notes (Signed)
Radiation Oncology         [phone] ________________________________  Initial outpatient Consultation  Name: Billy Lopez MRN: [id number]  Date: 10/14/2011  DOB: 1940/07/05  UX:LKGMW Billy Ruiz, MD, Lopez  Billy Matar, Lopez   REFERRING PHYSICIAN: Marcine Matar, Lopez  DIAGNOSIS: 71 year old gentleman with stage T2a adenocarcinoma of the prostate with Gleason score of 3+4 and PSA of 5.61  HISTORY OF PRESENT ILLNESS::Billy Lopez is a 71 y.o. gentleman who was noted to have an elevated PSA by his primary care physician. His PSA was 5.61 on 07/26/2011. This prompted a referral to urology. The patient was evaluated on March 4. Digital rectal exam performed by his urologist revealed a 2+ prostate with a 5 mm palpable nodule involving the left base of the prostate apparently confined to the prostatic capsule. The patient proceeded to transrectal ultrasound with 12 biopsies April 18. The prostate volume at that time was noted to be 31.41 cc. Out of 12 core biopsy specimens, 6 were positive. The maximum Gleason score 3+4 identified and 30% left lateral mid specimen. Gleason's 3+3 was seen in 40% of the right lateral mid, 20% of the right mid, 20% of the left lateral base, 10% left base, and 10% left lateral apex the specimens. The patient review the biopsy results with Dr. Arita Lopez and has currently been referred today for discussion of potential radiation treatment options. Of note, he does have a background history of HIV positivity but recalls that his CD4 count is greater than 300.  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    PAST SURGICAL  HISTORY: Past Surgical History  Procedure Date  . Hip surgery     total bilateral replacement  . Hernia repair   . Hand surgery     right  . Foot surgery     bilat. ingrown toenails removed    FAMILY HISTORY: family history includes Alcohol abuse in his other; Arthritis in his other; and Hypertension in his other.  SOCIAL HISTORY:  reports that he quit smoking about 51 years ago. His smoking use included Cigarettes. He has a 10 pack-year smoking history. He has never used smokeless tobacco. He reports that he uses illicit drugs (Heroin) about 7 times per week. He reports that he does not drink alcohol.  ALLERGIES: Review of patient's allergies indicates no known allergies.  MEDICATIONS:  Current Outpatient Prescriptions  Medication Sig Dispense Refill  . efavirenz-emtrictabine-tenofovir (ATRIPLA) 600-200-300 MG per tablet Take 1 tablet by mouth at bedtime.  31 tablet  4  . enalapril (VASOTEC) 20 MG tablet Take 1 tablet (20 mg total) by mouth daily.  30 tablet  11  . ENSURE (ENSURE) Take 1 Can by mouth 2 (two) times daily.  237 mL  12  . hydrochlorothiazide (HYDRODIURIL) 25 MG tablet Take 1 tablet (25 mg total) by mouth daily.  30 tablet  11  . loratadine (CLARITIN) 10 MG tablet Take 10 mg by mouth daily.        . naproxen (NAPROSYN) 500 MG tablet Take 1 tablet (500 mg total) by mouth 2 (two) times daily.  60 tablet  5  . pantoprazole (PROTONIX) 40 MG tablet Take 40 mg by mouth daily.        . traMADol (ULTRAM) 50  MG tablet Take 1 tablet (50 mg total) by mouth every 6 (six) hours as needed.  120 tablet  2    REVIEW OF SYSTEMS:  A 15 point review of systems is documented in the electronic medical record. This was obtained by the nursing staff. However, I reviewed this with the patient to discuss relevant findings and make appropriate changes.  The patient filled out a urinary function questionnaire today indicating overall score of 24 characterize and urinary outflow obstructive symptoms  as severe. he wakens 4 times nightly with nocturia. The patient also filled out in erectile function questionnaire indicating moderate impotence.    PHYSICAL EXAM:  weight is 160 lb 14.4 oz (72.984 kg). His temperature is 98.6 F (37 C). His blood pressure is 116/71 and his pulse is 72.   Detailed physical exam was deferred today in the interest of the patient counseling and coordination of care. Please note that his urologist describe a 5 mm nodule in the prostate gland.  LABORATORY DATA:  Lab Results  Component Value Date   WBC 3.2* 09/02/2011   HGB 14.7 09/02/2011   HCT 44.0 09/02/2011   MCV 88.0 09/02/2011   PLT 177 09/02/2011   Lab Results  Component Value Date   NA 139 09/02/2011   K 4.0 09/02/2011   CL 103 09/02/2011   CO2 30 09/02/2011   Lab Results  Component Value Date   ALT 44 09/02/2011   AST 47* 09/02/2011   ALKPHOS 105 09/02/2011   BILITOT 0.4 09/02/2011     RADIOGRAPHY: No results found.    IMPRESSION:  This patient is a very nice 71 year old gentleman with stage TII A. adenocarcinoma prostate Gleason score 3+4 and PSA of 5.61. He falls into an intermediate risk group based on his Gleason score. He is eligible for radiation therapy in the form of external beam radiation treatment versus external radiation treatment with prostate seed implant boost. However, his severe urinary symptoms would portend higher risk for urinary obstruction in the setting of prostate brachytherapy. Accordingly, patient may be best suited for IM RT alone.  PLAN: today, I talked to the patient of the findings and workup thus far cryptococcal the role radiation therapy management of prostate cancer. Also discussed the implications of T. stage, Gleason score and PSA treatment decisions as well as treatment outcomes. We discussed the potential radiation options described above as well as the rationale for possible IM RT. We compared and contrasted radiation approaches with surgery approaches. We discussed the logistics  and delivery of external radiation treatment as well as anticipated acute and late sequelae. The patient would like to consider proceeding with IMRT for prostate cancer and would like to return to Dr. Lindaann Slough office for placement of 3 gold fiducial markers for image guided radiotherapy. I would anticipate delivering 78 Gy to the prostate using image guidance and IMRT.  I spent 60 minutes minutes face to face with the patient and more than 50% of that time was spent in counseling and/or coordination of care.   ------------------------------------------------  [name], M.D.

## 2011-10-16 NOTE — Progress Notes (Signed)
Encounter addended by: Tessa Lerner, RN on: 10/16/2011  4:24 PM<BR>     Documentation filed: Charges VN

## 2011-10-16 NOTE — Progress Notes (Signed)
Encounter addended by: Tessa Lerner, RN on: 10/16/2011  4:27 PM<BR>     Documentation filed: Charges VN

## 2011-10-17 ENCOUNTER — Telehealth: Payer: Self-pay | Admitting: *Deleted

## 2011-10-17 NOTE — Telephone Encounter (Signed)
Called patient to inform of gold seed placement on 11-20-11- arrival time - 2:15 pm at Dr. Lenoria Chime Office and his sim appt. On 11-29-11 at 9:00 a.m. At Dr. Broadus Eaven Office, lvm on answering machine for a return call.

## 2011-10-18 NOTE — Progress Notes (Signed)
Encounter addended by: Rinaldo Cloud on: 10/18/2011  4:15 PM<BR>     Documentation filed: Charges VN

## 2011-10-18 NOTE — Progress Notes (Signed)
Encounter addended by: Rinaldo Cloud on: 10/18/2011  4:22 PM<BR>     Documentation filed: Charges VN

## 2011-11-11 ENCOUNTER — Telehealth: Payer: Self-pay | Admitting: *Deleted

## 2011-11-11 NOTE — Telephone Encounter (Signed)
XXXX 

## 2011-11-14 ENCOUNTER — Telehealth: Payer: Self-pay | Admitting: *Deleted

## 2011-11-14 NOTE — Telephone Encounter (Signed)
Called patient to alter sim appt. On 11-29-11, due to Dr. Kathrynn Running being out of town, offered an appt. For 11-15-11, patient declined this appt. Rescheduled for 12-06-11 at 9:00 a.m., patient agreed to new appt. Time and date

## 2011-11-18 ENCOUNTER — Other Ambulatory Visit: Payer: Self-pay | Admitting: Internal Medicine

## 2011-11-18 NOTE — Telephone Encounter (Signed)
Done per emr 

## 2011-11-28 ENCOUNTER — Other Ambulatory Visit: Payer: Self-pay | Admitting: *Deleted

## 2011-11-28 DIAGNOSIS — B20 Human immunodeficiency virus [HIV] disease: Secondary | ICD-10-CM

## 2011-11-28 MED ORDER — EFAVIRENZ-EMTRICITAB-TENOFOVIR 600-200-300 MG PO TABS: 1.0000 | ORAL_TABLET | Freq: Every day | ORAL | Status: DC

## 2011-11-28 NOTE — Telephone Encounter (Signed)
Pt requesting refill and to schedule appointments for lab work and MD in October.

## 2011-11-29 ENCOUNTER — Ambulatory Visit: Payer: PRIVATE HEALTH INSURANCE | Admitting: Radiation Oncology

## 2011-12-04 ENCOUNTER — Emergency Department (HOSPITAL_COMMUNITY)
Admission: EM | Admit: 2011-12-04 | Discharge: 2011-12-04 | Disposition: A | Discharge status: Home / Self Care | Payer: PRIVATE HEALTH INSURANCE | Attending: Emergency Medicine | Admitting: Emergency Medicine

## 2011-12-04 ENCOUNTER — Telehealth: Payer: Self-pay | Admitting: *Deleted

## 2011-12-04 DIAGNOSIS — I10 Essential (primary) hypertension: Secondary | ICD-10-CM | POA: Insufficient documentation

## 2011-12-04 DIAGNOSIS — J449 Chronic obstructive pulmonary disease, unspecified: Secondary | ICD-10-CM | POA: Insufficient documentation

## 2011-12-04 DIAGNOSIS — M79609 Pain in unspecified limb: Secondary | ICD-10-CM

## 2011-12-04 DIAGNOSIS — M79604 Pain in right leg: Secondary | ICD-10-CM

## 2011-12-04 DIAGNOSIS — J4489 Other specified chronic obstructive pulmonary disease: Secondary | ICD-10-CM | POA: Insufficient documentation

## 2011-12-04 DIAGNOSIS — Z21 Asymptomatic human immunodeficiency virus [HIV] infection status: Secondary | ICD-10-CM | POA: Insufficient documentation

## 2011-12-04 DIAGNOSIS — M7989 Other specified soft tissue disorders: Secondary | ICD-10-CM | POA: Insufficient documentation

## 2011-12-04 DIAGNOSIS — Z87891 Personal history of nicotine dependence: Secondary | ICD-10-CM | POA: Insufficient documentation

## 2011-12-04 MED ORDER — NAPROXEN 500 MG PO TABS: 500.0000 mg | ORAL_TABLET | Freq: Two times a day (BID) | ORAL | Status: DC

## 2011-12-04 MED ORDER — HYDROCODONE-ACETAMINOPHEN 5-325 MG PO TABS
2.0000 | ORAL_TABLET | Freq: Once | ORAL | Status: AC
  Administered 2011-12-04: 2 via ORAL
  Filled 2011-12-04: qty 2

## 2011-12-04 NOTE — ED Notes (Signed)
WGN:[id number]<HY> Expected date:12/04/11<BR> Expected time: 6:49 AM<BR> Means of arrival:<BR> Comments:<BR> closed

## 2011-12-04 NOTE — Telephone Encounter (Signed)
Patient called stating he didn't think he could make his appt on Friday with Dr.Manning, his right leg has been swollen ,red  Warm/hot and now its getting  stiff past 3 days, asked patient if this was new and patient stated yes, advised patient to go to the Emergency room, he may have a blood clot, and call us if admitted, patient stated he would go to [facility] today 1:23 PM

## 2011-12-04 NOTE — ED Notes (Signed)
Pt reported right leg swelling that started on Monday. Both legs are hurting and most of the pain is in right leg. Left leg not swollen. Right leg is becoming stiff and pt reported losing feelings in toes. Left side has intermitted pain, sharp. Pt was vomiting and diarrhea yesterday. Pt is taking medications for arthritis and blood thinner. Pt is taking blood thinners for past 5 years

## 2011-12-04 NOTE — ED Notes (Signed)
Vascular tech at bedside. °

## 2011-12-04 NOTE — Progress Notes (Signed)
VASCULAR LAB PRELIMINARY  PRELIMINARY  PRELIMINARY  PRELIMINARY  Lower extremity venous Doppler completed.    Preliminary report:  There is no DVT or SVT noted in the right lower extremity.  Travius Crochet, 12/04/2011, 6:50 PM

## 2011-12-04 NOTE — ED Provider Notes (Signed)
History     CSN: [id number]  Arrival date & time 12/04/11  1450   First MD Initiated Contact with Patient 12/04/11 1536      Chief Complaint  Patient presents with  . Joint Swelling  . DVT    (Consider location/radiation/quality/duration/timing/severity/associated sxs/prior treatment) HPI Comments: Pt complains of R leg pain - has some arthritis pain in the past which has flared up with the weather but this feels different.  He states that he has been on blood thinners in the past which he thinks was a blood clot he developed after surgery.  He stopped taking them an unknown time ago.  This started 2 days ago, is intermittent, and is associated with mild swelling of the RLE.  Denies travel, recent surgery, trauma but admits to swelling.  Hx of prostate cancer - treated with radiation therapy - last dose was 2 weeks ago, > 6 years of HIV meds.  Last CD4 count was 290 on 09/02/11.  Denies fevers, sob, cp, abd pain, headache, weakness / numbness.  The history is provided by the patient.    Past Medical History  Diagnosis Date  . Hypertension   . HIV infection   . Substance abuse   . 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 Allergic rhinitis, cause unspecified 07/21/2011  . Cervical spondylosis 07/21/2011  . Lumbar spondylosis 07/21/2011  . Osteoarthritis 07/21/2011  . COPD (chronic obstructive pulmonary disease) 07/21/2011  . Hearing loss on left 07/26/2011    Past Surgical History  Procedure Date  . Hip surgery     total bilateral replacement  . Hernia repair   . Hand surgery     right  . Foot surgery     bilat. ingrown toenails removed    Family History  Problem Relation Age of Onset  . Alcohol abuse Other   . Arthritis Other   . Hypertension Other     History  Substance Use Topics  . Smoking status: Former Smoker -- 1.0 packs/day for 10 years    Types: Cigarettes    Quit date: 10/13/1960  . Smokeless tobacco: Never Used  . Alcohol Use: No      Review of Systems  All  other systems reviewed and are negative.    Allergies  Review of patient's allergies indicates no known allergies.  Home Medications   Current Outpatient Rx  Name Route Sig Dispense Refill  . EFAVIRENZ-EMTRICITAB-TENOFOVIR 600-200-300 MG PO TABS Oral Take 1 tablet by mouth at bedtime. 31 tablet 4  . ENALAPRIL MALEATE 20 MG PO TABS Oral Take 1 tablet (20 mg total) by mouth daily. 30 tablet 11  . ENSURE PO LIQD Oral Take 1 Can by mouth 2 (two) times daily. 237 mL 12  . HYDROCHLOROTHIAZIDE 25 MG PO TABS Oral Take 1 tablet (25 mg total) by mouth daily. 30 tablet 11  . LORATADINE 10 MG PO TABS Oral Take 10 mg by mouth daily as needed. For allergies.    Marland Kitchen NAPROXEN 500 MG PO TABS Oral Take 1 tablet (500 mg total) by mouth 2 (two) times daily. 60 tablet 5  . PANTOPRAZOLE SODIUM 40 MG PO TBEC Oral Take 40 mg by mouth daily as needed. For indigestion.    . TRAMADOL HCL 50 MG PO TABS Oral Take 50 mg by mouth every 6 (six) hours as needed. For pain.    Marland Kitchen NAPROXEN 500 MG PO TABS Oral Take 1 tablet (500 mg total) by mouth 2 (two) times daily with a meal. 30 tablet  0    BP 144/91  Pulse 62  Temp 98.4 F (36.9 C) (Oral)  Resp 18  Ht 5\' 11"  (1.803 m)  Wt 168 lb (76.204 kg)  BMI 23.43 kg/m2  SpO2 100%  Physical Exam  Nursing note and vitals reviewed. Constitutional: He appears well-developed and well-nourished. No distress.  HENT:  Head: Normocephalic and atraumatic.  Mouth/Throat: Oropharynx is clear and moist. No oropharyngeal exudate.       MMM, no thrush  Eyes: Conjunctivae and EOM are normal. Pupils are equal, round, and reactive to light. Right eye exhibits no discharge. Left eye exhibits no discharge. No scleral icterus.  Neck: Normal range of motion. Neck supple. No JVD present. No thyromegaly present.  Cardiovascular: Normal rate, regular rhythm, normal heart sounds and intact distal pulses.  Exam reveals no gallop and no friction rub.   No murmur heard.      Normal pedal pulses and  CRT < 2 seconds  Pulmonary/Chest: Effort normal and breath sounds normal. No respiratory distress. He has no wheezes. He has no rales.  Abdominal: Soft. Bowel sounds are normal. He exhibits no distension and no mass. There is no tenderness.  Musculoskeletal: Normal range of motion. He exhibits tenderness ( mild ttp, homan's sign +, no objective swelling of LE's.). He exhibits no edema.  Lymphadenopathy:    He has no cervical adenopathy.  Neurological: He is alert. Coordination normal.       Clear speech, normal strenght and saensation in all 4 exzt.  Normal gait, normal coordination of limb's and trunk.  Skin: Skin is warm and dry. No rash noted. No erythema.  Psychiatric: He has a normal mood and affect. His behavior is normal.    ED Course  Procedures (including critical care time)  Labs Reviewed - No data to display No results found.   1. Lower extremity pain, right       MDM  Well appearing, normal VS, r/o DVT with [place] as has prior DVT and now with prostate CA on treatment.  No objective signs of DVT and no signs or sx of PE.  [place] pending.   Ultrasound read as negative, vital signs without significant findings, patient is well-appearing, has no other symptoms other than mild right lower 70 pain which he states waxes and waning with the weather.  Discharge Prescriptions include:  Naprosyn   Vida Roller, MD 12/04/11 2013

## 2011-12-04 NOTE — ED Notes (Signed)
No change in patients' pain level.

## 2011-12-06 ENCOUNTER — Ambulatory Visit
Admission: RE | Admit: 2011-12-06 | Discharge: 2011-12-06 | Disposition: A | Discharge status: Home / Self Care | Payer: PRIVATE HEALTH INSURANCE | Source: Ambulatory Visit | Attending: Radiation Oncology | Admitting: Radiation Oncology

## 2011-12-06 ENCOUNTER — Encounter: Payer: Self-pay | Admitting: Radiation Oncology

## 2011-12-06 DIAGNOSIS — C61 Malignant neoplasm of prostate: Secondary | ICD-10-CM

## 2011-12-06 NOTE — Progress Notes (Signed)
Met with patient to discuss RO billing.  Rad Tx: IMRT x40  Attending Rad: Dr. Kathrynn Running  Dx: 185 Malignant neoplasm of prostate

## 2011-12-06 NOTE — Progress Notes (Signed)
  Radiation Oncology         [phone] ________________________________  Name: Billy Lopez MRN: [id number]  Date: 12/06/2011  DOB: Oct 24, 1940  SIMULATION AND TREATMENT PLANNING NOTE  DIAGNOSIS: 71 year old gentleman with stage T2a adenocarcinoma of the prostate with Gleason score of 3+4 and PSA of 5.61   NARRATIVE:  The patient was brought to the CT Simulation planning suite.  Identity was confirmed.  All relevant records and images related to the planned course of therapy were reviewed.  The patient freely provided informed written consent to proceed with treatment after reviewing the details related to the planned course of therapy. The consent form was witnessed and verified by the simulation staff.  Then, the patient was set-up in a stable reproducible  supine position for radiation therapy.  CT images were obtained.  Surface markings were placed.  The CT images were loaded into the planning software.  Then the target and avoidance structures were contoured.  Treatment planning then occurred.  The radiation prescription was entered and confirmed.  A total of 1 complex treatment device was fabricated in the form of an alpha cradle immobilization device for pelvic immobilization and daily positioning. I have requested : Intensity Modulated Radiotherapy (IMRT) is medically necessary for this case for the following reason:  Rectal sparing.  PLAN:  The patient will receive 78 Gy in 40 fraction.  ________________________________  [name], M.D.

## 2011-12-09 ENCOUNTER — Encounter: Payer: Self-pay | Admitting: *Deleted

## 2011-12-17 ENCOUNTER — Ambulatory Visit
Admission: RE | Admit: 2011-12-17 | Discharge: 2011-12-17 | Disposition: A | Discharge status: Home / Self Care | Payer: PRIVATE HEALTH INSURANCE | Source: Ambulatory Visit | Attending: Radiation Oncology | Admitting: Radiation Oncology

## 2011-12-17 DIAGNOSIS — C61 Malignant neoplasm of prostate: Secondary | ICD-10-CM

## 2011-12-18 ENCOUNTER — Ambulatory Visit
Admission: RE | Admit: 2011-12-18 | Discharge: 2011-12-18 | Disposition: A | Discharge status: Home / Self Care | Payer: PRIVATE HEALTH INSURANCE | Source: Ambulatory Visit | Attending: Radiation Oncology | Admitting: Radiation Oncology

## 2011-12-18 NOTE — Progress Notes (Signed)
Routine of clinic reviewed.Patient aware doctor day generally on Friday after radiation treatment but if there is a problem or concern to inform radiation therapist as nurse assessment may be performed while here in clinic.Patient able to verbalize back side effects of frequency, urgency, bowel changes and fatigue.Calendar printed off and faxed to [facility]Fax [phone].

## 2011-12-19 ENCOUNTER — Ambulatory Visit
Admission: RE | Admit: 2011-12-19 | Discharge: 2011-12-19 | Disposition: A | Discharge status: Home / Self Care | Payer: PRIVATE HEALTH INSURANCE | Source: Ambulatory Visit | Attending: Radiation Oncology | Admitting: Radiation Oncology

## 2011-12-20 ENCOUNTER — Encounter: Payer: Self-pay | Admitting: Radiation Oncology

## 2011-12-20 ENCOUNTER — Ambulatory Visit
Admission: RE | Admit: 2011-12-20 | Discharge: 2011-12-20 | Disposition: A | Discharge status: Home / Self Care | Payer: PRIVATE HEALTH INSURANCE | Source: Ambulatory Visit | Attending: Radiation Oncology | Admitting: Radiation Oncology

## 2011-12-20 VITALS — BP 133/90 | HR 58 | Temp 98.2°F | Resp 20 | Wt 163.6 lb

## 2011-12-20 DIAGNOSIS — C61 Malignant neoplasm of prostate: Secondary | ICD-10-CM

## 2011-12-20 NOTE — Progress Notes (Signed)
Patient arrived ,alert,oriented x3, walking steady with cane, no c/o pain,dysuria, gets up 2x night to void, no c/o pain 4:23 PM

## 2011-12-23 ENCOUNTER — Encounter: Payer: Self-pay | Admitting: Radiation Oncology

## 2011-12-23 ENCOUNTER — Ambulatory Visit
Admission: RE | Admit: 2011-12-23 | Discharge: 2011-12-23 | Disposition: A | Discharge status: Home / Self Care | Payer: PRIVATE HEALTH INSURANCE | Source: Ambulatory Visit | Attending: Radiation Oncology | Admitting: Radiation Oncology

## 2011-12-23 NOTE — Progress Notes (Signed)
  Radiation Oncology         [phone] ________________________________  Name: Billy Lopez MRN: [id number]  Date: 12/20/2011  DOB: 1940-08-31  Weekly Radiation Therapy Management  Current Dose: 7.8 Gy     Planned Dose:  78 Gy  Narrative . . . . . . . . The patient presents for routine under treatment assessment.                                                      The patient is without complaint.                                 Set-up films were reviewed.                                 The chart was checked. Physical Findings. . . Weight essentially stable.  No significant changes. Impression . . . . . . . The patient is  tolerating radiation. Plan . . . . . . . . . . . . Continue treatment as planned.  ________________________________  [name], M.D.

## 2011-12-24 ENCOUNTER — Ambulatory Visit
Admission: RE | Admit: 2011-12-24 | Discharge: 2011-12-24 | Disposition: A | Discharge status: Home / Self Care | Payer: PRIVATE HEALTH INSURANCE | Source: Ambulatory Visit | Attending: Radiation Oncology | Admitting: Radiation Oncology

## 2011-12-25 ENCOUNTER — Ambulatory Visit
Admission: RE | Admit: 2011-12-25 | Discharge: 2011-12-25 | Disposition: A | Discharge status: Home / Self Care | Payer: PRIVATE HEALTH INSURANCE | Source: Ambulatory Visit | Attending: Radiation Oncology | Admitting: Radiation Oncology

## 2011-12-26 ENCOUNTER — Ambulatory Visit
Admission: RE | Admit: 2011-12-26 | Discharge: 2011-12-26 | Disposition: A | Discharge status: Home / Self Care | Payer: PRIVATE HEALTH INSURANCE | Source: Ambulatory Visit | Attending: Radiation Oncology | Admitting: Radiation Oncology

## 2011-12-27 ENCOUNTER — Ambulatory Visit
Admission: RE | Admit: 2011-12-27 | Discharge: 2011-12-27 | Disposition: A | Discharge status: Home / Self Care | Payer: PRIVATE HEALTH INSURANCE | Source: Ambulatory Visit | Attending: Radiation Oncology | Admitting: Radiation Oncology

## 2011-12-27 ENCOUNTER — Encounter: Payer: Self-pay | Admitting: Radiation Oncology

## 2011-12-27 VITALS — BP 127/90 | HR 55 | Temp 97.1°F | Resp 20 | Wt 160.8 lb

## 2011-12-27 DIAGNOSIS — M25552 Pain in left hip: Secondary | ICD-10-CM

## 2011-12-27 DIAGNOSIS — C61 Malignant neoplasm of prostate: Secondary | ICD-10-CM

## 2011-12-27 MED ORDER — HYDROCODONE-ACETAMINOPHEN 5-500 MG PO CAPS: 1.0000 | ORAL_CAPSULE | Freq: Four times a day (QID) | ORAL | Status: AC | PRN

## 2011-12-27 NOTE — Progress Notes (Signed)
  Radiation Oncology         [phone] ________________________________  Name: Billy Lopez MRN: [id number]  Date: 12/27/2011  DOB: 1940/08/23  Weekly Radiation Therapy Management  Current Dose: 15.6 Gy     Planned Dose:  78 Gy  Narrative . . . . . . . . The patient presents for routine under treatment assessment.                                                      The patient has bilateral hip implant pain                                 Set-up films were reviewed.                                 The chart was checked. Physical Findings. . . Weight essentially stable.  No significant changes. Impression . . . . . . . The patient is  tolerating radiation. Plan . . . . . . . . . . . . Continue treatment as planned.  Hydrocodone/APAP 5/500  ________________________________  Billy Lopez, M.D.

## 2011-12-27 NOTE — Progress Notes (Signed)
Patient arrived ambulating with a cne, steady gait, , no c/o dysuria up at night 2x with urgency to void, takes naprosyn 500mg  in am and pm, tramadol 50mg  in am, last taken both today 9am, c/o b/l hip pain since radiation treatments started states patient, doesn't take anything to help him sleep either, says was up last night with hip pain,requesting pain rx 3:08 PM

## 2011-12-30 ENCOUNTER — Ambulatory Visit
Admission: RE | Admit: 2011-12-30 | Discharge: 2011-12-30 | Disposition: A | Discharge status: Home / Self Care | Payer: PRIVATE HEALTH INSURANCE | Source: Ambulatory Visit | Attending: Radiation Oncology | Admitting: Radiation Oncology

## 2011-12-31 ENCOUNTER — Ambulatory Visit
Admission: RE | Admit: 2011-12-31 | Discharge: 2011-12-31 | Disposition: A | Discharge status: Home / Self Care | Payer: PRIVATE HEALTH INSURANCE | Source: Ambulatory Visit | Attending: Radiation Oncology | Admitting: Radiation Oncology

## 2012-01-01 ENCOUNTER — Ambulatory Visit
Admission: RE | Admit: 2012-01-01 | Discharge: 2012-01-01 | Disposition: A | Discharge status: Home / Self Care | Payer: PRIVATE HEALTH INSURANCE | Source: Ambulatory Visit | Attending: Radiation Oncology | Admitting: Radiation Oncology

## 2012-01-02 ENCOUNTER — Other Ambulatory Visit: Payer: Self-pay | Admitting: *Deleted

## 2012-01-02 ENCOUNTER — Ambulatory Visit
Admission: RE | Admit: 2012-01-02 | Discharge: 2012-01-02 | Disposition: A | Discharge status: Home / Self Care | Payer: PRIVATE HEALTH INSURANCE | Source: Ambulatory Visit | Attending: Radiation Oncology | Admitting: Radiation Oncology

## 2012-01-02 DIAGNOSIS — B20 Human immunodeficiency virus [HIV] disease: Secondary | ICD-10-CM

## 2012-01-02 MED ORDER — EFAVIRENZ-EMTRICITAB-TENOFOVIR 600-200-300 MG PO TABS: 1.0000 | ORAL_TABLET | Freq: Every day | ORAL | Status: DC

## 2012-01-03 ENCOUNTER — Ambulatory Visit
Admission: RE | Admit: 2012-01-03 | Discharge: 2012-01-03 | Disposition: A | Discharge status: Home / Self Care | Payer: PRIVATE HEALTH INSURANCE | Source: Ambulatory Visit | Attending: Radiation Oncology | Admitting: Radiation Oncology

## 2012-01-03 ENCOUNTER — Encounter: Payer: Self-pay | Admitting: Radiation Oncology

## 2012-01-03 VITALS — BP 129/80 | HR 66 | Temp 97.2°F | Resp 18 | Wt 165.4 lb

## 2012-01-03 DIAGNOSIS — C61 Malignant neoplasm of prostate: Secondary | ICD-10-CM

## 2012-01-03 DIAGNOSIS — M1990 Unspecified osteoarthritis, unspecified site: Secondary | ICD-10-CM

## 2012-01-03 MED ORDER — TAMSULOSIN HCL 0.4 MG PO CAPS: 0.4000 mg | ORAL_CAPSULE | Freq: Every day | ORAL | Status: DC

## 2012-01-03 NOTE — Progress Notes (Signed)
Patient presents to the clinic today unaccompanied for an under treat visit with Dr. Kathrynn Running. Patient is alert and oriented to person, place, and time. No distress noted. Steady gait noted with assistance of cane. Pleasant affect noted. Patient denies pain at this time. Patient reports on average he gets up three times per night to void. Patient reports clear yellow urine without odorless. Patient denies hematuria. Patient denies burning with urination. Patient denies incontinence. Reported all findings to Dr. Kathrynn Running.

## 2012-01-05 ENCOUNTER — Encounter: Payer: Self-pay | Admitting: Radiation Oncology

## 2012-01-05 NOTE — Progress Notes (Signed)
  Radiation Oncology         [phone] ________________________________  Name: Billy Lopez MRN: [id number]  Date: 01/03/2012  DOB: 1941-03-30  Weekly Radiation Therapy Management  Current Dose: 27.3 Gy     Planned Dose:  78 Gy  Narrative . . . . . . . . The patient presents for routine under treatment assessment.                                                      The patient is without complaint except nocturia x 4                                 Set-up films were reviewed.                                 The chart was checked. Physical Findings. . . Weight essentially stable.  No significant changes. Impression . . . . . . . The patient is  tolerating radiation. Plan . . . . . . . . . . . . Continue treatment as planned. Given Flomax 0.4 mg Each bedtime  ________________________________  [name], M.D.

## 2012-01-06 ENCOUNTER — Ambulatory Visit
Admission: RE | Admit: 2012-01-06 | Discharge: 2012-01-06 | Disposition: A | Discharge status: Home / Self Care | Payer: PRIVATE HEALTH INSURANCE | Source: Ambulatory Visit | Attending: Radiation Oncology | Admitting: Radiation Oncology

## 2012-01-07 ENCOUNTER — Ambulatory Visit
Admission: RE | Admit: 2012-01-07 | Discharge: 2012-01-07 | Disposition: A | Discharge status: Home / Self Care | Payer: PRIVATE HEALTH INSURANCE | Source: Ambulatory Visit | Attending: Radiation Oncology | Admitting: Radiation Oncology

## 2012-01-08 ENCOUNTER — Ambulatory Visit: Payer: PRIVATE HEALTH INSURANCE

## 2012-01-09 ENCOUNTER — Ambulatory Visit
Admission: RE | Admit: 2012-01-09 | Discharge: 2012-01-09 | Disposition: A | Discharge status: Home / Self Care | Payer: PRIVATE HEALTH INSURANCE | Source: Ambulatory Visit | Attending: Radiation Oncology | Admitting: Radiation Oncology

## 2012-01-09 VITALS — BP 116/74 | HR 71 | Temp 98.4°F | Wt 164.5 lb

## 2012-01-09 DIAGNOSIS — C61 Malignant neoplasm of prostate: Secondary | ICD-10-CM

## 2012-01-09 NOTE — Progress Notes (Signed)
   Department of Radiation Oncology  Phone:  [phone] Fax:        [phone]  Weekly Treatment Note    Name: Billy Lopez Date: 01/09/2012 MRN: [id number] DOB: 1940/12/17   Current dose: 31.2 Gy  Current fraction: 16   MEDICATIONS: Current Outpatient Prescriptions  Medication Sig Dispense Refill  . efavirenz-emtricitabine-tenofovir (ATRIPLA) 600-200-300 MG per tablet Take 1 tablet by mouth at bedtime.  31 tablet  4  . enalapril (VASOTEC) 20 MG tablet Take 1 tablet (20 mg total) by mouth daily.  30 tablet  11  . ENSURE (ENSURE) Take 1 Can by mouth 2 (two) times daily.  237 mL  12  . hydrochlorothiazide (HYDRODIURIL) 25 MG tablet Take 1 tablet (25 mg total) by mouth daily.  30 tablet  11  . levofloxacin (LEVAQUIN) 500 MG tablet       . loratadine (CLARITIN) 10 MG tablet Take 10 mg by mouth daily as needed. For allergies.      . naproxen (NAPROSYN) 500 MG tablet Take 1 tablet (500 mg total) by mouth 2 (two) times daily.  60 tablet  5  . naproxen (NAPROSYN) 500 MG tablet Take 1 tablet (500 mg total) by mouth 2 (two) times daily with a meal.  30 tablet  0  . pantoprazole (PROTONIX) 40 MG tablet Take 40 mg by mouth daily as needed. For indigestion.      . Tamsulosin HCl (FLOMAX) 0.4 MG CAPS Take 1 capsule (0.4 mg total) by mouth daily after supper.  30 capsule  5  . traMADol (ULTRAM) 50 MG tablet Take 50 mg by mouth every 6 (six) hours as needed. For pain.         ALLERGIES: Review of patient's allergies indicates no known allergies.   LABORATORY DATA:  Lab Results  Component Value Date   WBC 3.2* 09/02/2011   HGB 14.7 09/02/2011   HCT 44.0 09/02/2011   MCV 88.0 09/02/2011   PLT 177 09/02/2011   Lab Results  Component Value Date   NA 139 09/02/2011   K 4.0 09/02/2011   CL 103 09/02/2011   CO2 30 09/02/2011   Lab Results  Component Value Date   ALT 44 09/02/2011   AST 47* 09/02/2011   ALKPHOS 105 09/02/2011   BILITOT 0.4 09/02/2011     NARRATIVE: Billy Lopez was seen today for  weekly treatment management. The chart was checked and the patient's films were reviewed. The patient indicates that he is doing well. He is seen before his 17th treatment today. The patient denies significant frequency or urgency of urination typically. He does complain of nocturia 3-4 per night. He does also note that he drinks a lot of fluids. No diarrhea.  PHYSICAL EXAMINATION: weight is 164 lb 8 oz (74.617 kg). His temperature is 98.4 F (36.9 C). His blood pressure is 116/74 and his pulse is 71.        ASSESSMENT: The patient is doing satisfactorily with treatment.  PLAN: We will continue with the patient's radiation treatment as planned. I talked with the patient about decreasing his fluid intake within a couple of hours prior to going to bed. We will see how this does and he will let us know if there are any further difficulties along these lines. Overall it appears that the patient is doing very well.

## 2012-01-09 NOTE — Progress Notes (Signed)
Routine weekly md visit for prostate cancer radiation.Denies pain.No frequency or urgency but does have nocturia up to 3 to 4 times but this more related to fluid intake.No problems with bowel movements, has had 2 today.Will have completed 17 of 40 treatments today as patient being seen prior to treatment as here early for appointment.

## 2012-01-10 ENCOUNTER — Ambulatory Visit
Admission: RE | Admit: 2012-01-10 | Discharge: 2012-01-10 | Disposition: A | Discharge status: Home / Self Care | Payer: PRIVATE HEALTH INSURANCE | Source: Ambulatory Visit | Attending: Radiation Oncology | Admitting: Radiation Oncology

## 2012-01-13 ENCOUNTER — Ambulatory Visit
Admission: RE | Admit: 2012-01-13 | Discharge: 2012-01-13 | Disposition: A | Discharge status: Home / Self Care | Payer: PRIVATE HEALTH INSURANCE | Source: Ambulatory Visit | Attending: Radiation Oncology | Admitting: Radiation Oncology

## 2012-01-13 DIAGNOSIS — Z510 Encounter for antineoplastic radiation therapy: Secondary | ICD-10-CM | POA: Insufficient documentation

## 2012-01-13 DIAGNOSIS — C61 Malignant neoplasm of prostate: Secondary | ICD-10-CM | POA: Insufficient documentation

## 2012-01-13 DIAGNOSIS — J4489 Other specified chronic obstructive pulmonary disease: Secondary | ICD-10-CM | POA: Insufficient documentation

## 2012-01-13 DIAGNOSIS — I10 Essential (primary) hypertension: Secondary | ICD-10-CM | POA: Insufficient documentation

## 2012-01-13 DIAGNOSIS — J449 Chronic obstructive pulmonary disease, unspecified: Secondary | ICD-10-CM | POA: Insufficient documentation

## 2012-01-13 DIAGNOSIS — Z21 Asymptomatic human immunodeficiency virus [HIV] infection status: Secondary | ICD-10-CM | POA: Insufficient documentation

## 2012-01-13 DIAGNOSIS — Z79899 Other long term (current) drug therapy: Secondary | ICD-10-CM | POA: Insufficient documentation

## 2012-01-14 ENCOUNTER — Ambulatory Visit
Admission: RE | Admit: 2012-01-14 | Discharge: 2012-01-14 | Disposition: A | Discharge status: Home / Self Care | Payer: PRIVATE HEALTH INSURANCE | Source: Ambulatory Visit | Attending: Radiation Oncology | Admitting: Radiation Oncology

## 2012-01-15 ENCOUNTER — Ambulatory Visit
Admission: RE | Admit: 2012-01-15 | Discharge: 2012-01-15 | Disposition: A | Discharge status: Home / Self Care | Payer: PRIVATE HEALTH INSURANCE | Source: Ambulatory Visit | Attending: Radiation Oncology | Admitting: Radiation Oncology

## 2012-01-16 ENCOUNTER — Ambulatory Visit
Admission: RE | Admit: 2012-01-16 | Discharge: 2012-01-16 | Disposition: A | Discharge status: Home / Self Care | Payer: PRIVATE HEALTH INSURANCE | Source: Ambulatory Visit | Attending: Radiation Oncology | Admitting: Radiation Oncology

## 2012-01-17 ENCOUNTER — Encounter: Payer: Self-pay | Admitting: Radiation Oncology

## 2012-01-17 ENCOUNTER — Ambulatory Visit
Admission: RE | Admit: 2012-01-17 | Discharge: 2012-01-17 | Disposition: A | Discharge status: Home / Self Care | Payer: PRIVATE HEALTH INSURANCE | Source: Ambulatory Visit | Attending: Radiation Oncology | Admitting: Radiation Oncology

## 2012-01-17 VITALS — BP 118/75 | HR 64 | Temp 97.3°F | Resp 18 | Wt 161.0 lb

## 2012-01-17 DIAGNOSIS — C61 Malignant neoplasm of prostate: Secondary | ICD-10-CM

## 2012-01-17 NOTE — Progress Notes (Signed)
  Radiation Oncology         [phone] ________________________________  Name: Billy Lopez MRN: [id number]  Date: 01/17/2012  DOB: 1941/04/24  Weekly Radiation Therapy Management  Current Dose: 44.85 Gy     Planned Dose:  78 Gy  Narrative . . . . . . . . The patient presents for routine under treatment assessment.                                                      The patient is without complaint. Mild Diarrhea, dysuria                                 Set-up films were reviewed.                                 The chart was checked. Physical Findings. . . Weight essentially stable.  No significant changes. Impression . . . . . . . The patient is  tolerating radiation. Plan . . . . . . . . . . . . Continue treatment as planned.  ________________________________  [name], M.D.

## 2012-01-17 NOTE — Progress Notes (Signed)
Patient presents to the clinic today unaccompanied for a PUT with Dr. Kathrynn Running. Patient is alert and oriented to person, place, and time. No distress noted. Steady gait noted with assistance of cane. Patient denies pain at this time. Patient reports slight burning with urination. Patient denies hematuria. Patient reports on average he gets up twice per night to void. Patient reports only occasional diarrhea but, denies pain associated with bowel movements. Patient continues to take flomax as directed. Reported all findings to Dr. Kathrynn Running.

## 2012-01-20 ENCOUNTER — Ambulatory Visit
Admission: RE | Admit: 2012-01-20 | Discharge: 2012-01-20 | Disposition: A | Discharge status: Home / Self Care | Payer: PRIVATE HEALTH INSURANCE | Source: Ambulatory Visit | Attending: Radiation Oncology | Admitting: Radiation Oncology

## 2012-01-20 ENCOUNTER — Other Ambulatory Visit: Payer: Self-pay | Admitting: Radiation Oncology

## 2012-01-20 DIAGNOSIS — C61 Malignant neoplasm of prostate: Secondary | ICD-10-CM

## 2012-01-20 MED ORDER — HYDROCODONE-ACETAMINOPHEN 5-500 MG PO TABS: 1.0000 | ORAL_TABLET | Freq: Four times a day (QID) | ORAL | Status: DC | PRN

## 2012-01-21 ENCOUNTER — Ambulatory Visit
Admission: RE | Admit: 2012-01-21 | Discharge: 2012-01-21 | Disposition: A | Discharge status: Home / Self Care | Payer: PRIVATE HEALTH INSURANCE | Source: Ambulatory Visit | Attending: Radiation Oncology | Admitting: Radiation Oncology

## 2012-01-22 ENCOUNTER — Ambulatory Visit: Admission: RE | Admit: 2012-01-22 | Payer: PRIVATE HEALTH INSURANCE | Source: Ambulatory Visit

## 2012-01-23 ENCOUNTER — Ambulatory Visit
Admission: RE | Admit: 2012-01-23 | Discharge: 2012-01-23 | Disposition: A | Discharge status: Home / Self Care | Payer: PRIVATE HEALTH INSURANCE | Source: Ambulatory Visit | Attending: Radiation Oncology | Admitting: Radiation Oncology

## 2012-01-24 ENCOUNTER — Ambulatory Visit
Admission: RE | Admit: 2012-01-24 | Discharge: 2012-01-24 | Disposition: A | Discharge status: Home / Self Care | Payer: PRIVATE HEALTH INSURANCE | Source: Ambulatory Visit | Attending: Radiation Oncology | Admitting: Radiation Oncology

## 2012-01-24 ENCOUNTER — Encounter: Payer: Self-pay | Admitting: Radiation Oncology

## 2012-01-24 VITALS — BP 125/79 | HR 62 | Temp 98.0°F | Wt 163.8 lb

## 2012-01-24 DIAGNOSIS — C61 Malignant neoplasm of prostate: Secondary | ICD-10-CM

## 2012-01-24 NOTE — Progress Notes (Signed)
Weekly under treat visit for prostate cancer.Completed 27 of 40 treatments.Urinary symptoms of burning and having to push have improved since starting flomax. Bowels are normal and regular.

## 2012-01-24 NOTE — Progress Notes (Signed)
  Radiation Oncology         [phone] ________________________________  Name: Billy Lopez MRN: [id number]  Date: 01/24/2012  DOB: 1940/06/25  Weekly Radiation Therapy Management  Current Dose: 52.65 Gy     Planned Dose:  78 Gy  Narrative . . . . . . . . The patient presents for routine under treatment assessment.                                                      The patient is without complaint.                                 Set-up films were reviewed.                                 The chart was checked. Physical Findings. . . Weight essentially stable.  No significant changes. Impression . . . . . . . The patient is  tolerating radiation. Plan . . . . . . . . . . . . Continue treatment as planned.  ________________________________  [name], M.D.

## 2012-01-27 ENCOUNTER — Ambulatory Visit
Admission: RE | Admit: 2012-01-27 | Discharge: 2012-01-27 | Disposition: A | Discharge status: Home / Self Care | Payer: PRIVATE HEALTH INSURANCE | Source: Ambulatory Visit | Attending: Radiation Oncology | Admitting: Radiation Oncology

## 2012-01-28 ENCOUNTER — Ambulatory Visit
Admission: RE | Admit: 2012-01-28 | Discharge: 2012-01-28 | Disposition: A | Discharge status: Home / Self Care | Payer: PRIVATE HEALTH INSURANCE | Source: Ambulatory Visit | Attending: Radiation Oncology | Admitting: Radiation Oncology

## 2012-01-29 ENCOUNTER — Ambulatory Visit
Admission: RE | Admit: 2012-01-29 | Discharge: 2012-01-29 | Disposition: A | Discharge status: Home / Self Care | Payer: PRIVATE HEALTH INSURANCE | Source: Ambulatory Visit | Attending: Radiation Oncology | Admitting: Radiation Oncology

## 2012-01-30 ENCOUNTER — Ambulatory Visit
Admission: RE | Admit: 2012-01-30 | Discharge: 2012-01-30 | Disposition: A | Discharge status: Home / Self Care | Payer: PRIVATE HEALTH INSURANCE | Source: Ambulatory Visit | Attending: Radiation Oncology | Admitting: Radiation Oncology

## 2012-01-31 ENCOUNTER — Ambulatory Visit
Admission: RE | Admit: 2012-01-31 | Discharge: 2012-01-31 | Disposition: A | Discharge status: Home / Self Care | Payer: PRIVATE HEALTH INSURANCE | Source: Ambulatory Visit | Attending: Radiation Oncology | Admitting: Radiation Oncology

## 2012-01-31 ENCOUNTER — Encounter: Payer: Self-pay | Admitting: Radiation Oncology

## 2012-01-31 VITALS — BP 151/71 | HR 77 | Temp 98.6°F | Wt 164.4 lb

## 2012-01-31 DIAGNOSIS — C61 Malignant neoplasm of prostate: Secondary | ICD-10-CM

## 2012-01-31 NOTE — Assessment & Plan Note (Signed)
  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 . . . . . . . The patient presents for routine under treatment assessment.                                                      The patient is without complaint.  Increased nocturia                                 Set-up films were reviewed.                                 The chart was checked. Physical Findings. . . Weight essentially stable.  No significant changes  Filed Vitals:   01/31/12 1507  BP: 151/71  Pulse: 77  Temp: 98.6 F (37 C)   . Impression . . . . . . . The patient is  tolerating radiation. Plan . . . . . . . . . . . . Continue treatment as planned.  ________________________________  Billy Pais. Kathrynn Running, M.D.

## 2012-01-31 NOTE — Progress Notes (Signed)
Patient here for weekly under treat assessment for prostate(pelvic) radiation.Has completed 32 of 40 treatments.Frequency and urgency of urination but no burning.Nocturia up to 4 times. Bowels normal and soft consistency.Moderate fatigue relieved with exercise.

## 2012-01-31 NOTE — Progress Notes (Signed)
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 . . . . . . . The patient presents for routine under treatment assessment.                                                      The patient is without complaint.  Increased nocturia                                 Set-up films were reviewed.                                 The chart was checked. Physical Findings. . . Weight essentially stable.  No significant changes  Filed Vitals:   01/31/12 1507  BP: 151/71  Pulse: 77  Temp: 98.6 F (37 C)   . Impression . . . . . . . The patient is  tolerating radiation. Plan . . . . . . . . . . . . Continue treatment as planned.  ________________________________  [name], M.D.

## 2012-02-04 ENCOUNTER — Ambulatory Visit
Admission: RE | Admit: 2012-02-04 | Discharge: 2012-02-04 | Disposition: A | Discharge status: Home / Self Care | Payer: PRIVATE HEALTH INSURANCE | Source: Ambulatory Visit | Attending: Radiation Oncology | Admitting: Radiation Oncology

## 2012-02-05 ENCOUNTER — Ambulatory Visit
Admission: RE | Admit: 2012-02-05 | Discharge: 2012-02-05 | Disposition: A | Discharge status: Home / Self Care | Payer: PRIVATE HEALTH INSURANCE | Source: Ambulatory Visit | Attending: Radiation Oncology | Admitting: Radiation Oncology

## 2012-02-06 ENCOUNTER — Ambulatory Visit
Admission: RE | Admit: 2012-02-06 | Discharge: 2012-02-06 | Disposition: A | Discharge status: Home / Self Care | Payer: PRIVATE HEALTH INSURANCE | Source: Ambulatory Visit | Attending: Radiation Oncology | Admitting: Radiation Oncology

## 2012-02-07 ENCOUNTER — Ambulatory Visit
Admission: RE | Admit: 2012-02-07 | Discharge: 2012-02-07 | Disposition: A | Discharge status: Home / Self Care | Payer: PRIVATE HEALTH INSURANCE | Source: Ambulatory Visit | Attending: Radiation Oncology | Admitting: Radiation Oncology

## 2012-02-07 ENCOUNTER — Telehealth: Payer: Self-pay | Admitting: *Deleted

## 2012-02-07 VITALS — Wt 165.3 lb

## 2012-02-07 DIAGNOSIS — C61 Malignant neoplasm of prostate: Secondary | ICD-10-CM

## 2012-02-07 NOTE — Progress Notes (Signed)
Patient here for weekly under treat assessment or radiation of prostate.Has completed 36 of 40 treatments.Denies pain.Continued mild urgency and frequency of urination.Bowel patterns normal just experiencing some mild fatigue but able to perform activities of daily living without difficulty.

## 2012-02-07 NOTE — Telephone Encounter (Signed)
Patient called and advised that it is time for him to have his ensure refilled. Advised patient to discuss this Rx with provider at his next visit as his BMI is within normal range. Patient was concerned that he would not be able to get anymore but advised him that doctors can prescribe with a healthy BMI for other medical reasons to just speak with his doctor about it. He agreed.

## 2012-02-09 ENCOUNTER — Encounter: Payer: Self-pay | Admitting: Radiation Oncology

## 2012-02-09 NOTE — Progress Notes (Signed)
  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 . . . . . . . The patient presents for routine under treatment assessment.                                                Patient here for weekly under treat assessment or radiation of prostate.Has completed 36 of 40 treatments.Denies pain.Continued mild urgency and frequency of urination.Bowel patterns normal just experiencing some mild fatigue but able to perform activities of daily living without difficulty.                                    Set-up films were reviewed.                                 The chart was checked. Physical Findings. . . Weight essentially stable.   No significant changes. Impression . . . . . . . The patient is  tolerating radiation. Plan . . . . . . . . . . . . Continue treatment as planned.  ________________________________  [name], M.D.

## 2012-02-09 NOTE — Assessment & Plan Note (Signed)
   Current dose: 70.2 Gy  projected dose: 78 Gy

## 2012-02-10 ENCOUNTER — Ambulatory Visit
Admission: RE | Admit: 2012-02-10 | Discharge: 2012-02-10 | Disposition: A | Discharge status: Home / Self Care | Payer: PRIVATE HEALTH INSURANCE | Source: Ambulatory Visit | Attending: Radiation Oncology | Admitting: Radiation Oncology

## 2012-02-11 ENCOUNTER — Ambulatory Visit
Admission: RE | Admit: 2012-02-11 | Discharge: 2012-02-11 | Disposition: A | Discharge status: Home / Self Care | Payer: PRIVATE HEALTH INSURANCE | Source: Ambulatory Visit | Attending: Radiation Oncology | Admitting: Radiation Oncology

## 2012-02-12 ENCOUNTER — Ambulatory Visit
Admission: RE | Admit: 2012-02-12 | Discharge: 2012-02-12 | Disposition: A | Discharge status: Home / Self Care | Payer: PRIVATE HEALTH INSURANCE | Source: Ambulatory Visit | Attending: Radiation Oncology | Admitting: Radiation Oncology

## 2012-02-13 ENCOUNTER — Ambulatory Visit
Admission: RE | Admit: 2012-02-13 | Discharge: 2012-02-13 | Disposition: A | Discharge status: Home / Self Care | Payer: PRIVATE HEALTH INSURANCE | Source: Ambulatory Visit | Attending: Radiation Oncology | Admitting: Radiation Oncology

## 2012-02-13 DIAGNOSIS — C61 Malignant neoplasm of prostate: Secondary | ICD-10-CM

## 2012-02-24 ENCOUNTER — Telehealth: Payer: Self-pay | Admitting: *Deleted

## 2012-02-24 NOTE — Telephone Encounter (Signed)
Patient called requesting refill on Tramadol, last filled at this office 07/2011.  He has been getting vicodin from Dr. Ashley Royalty and Naproxen from Dr. Oliver Barre. Advised him to call his PCP. Wendall Mola CMA

## 2012-02-25 ENCOUNTER — Other Ambulatory Visit: Payer: Self-pay | Admitting: Internal Medicine

## 2012-02-25 NOTE — Telephone Encounter (Signed)
Done erx 

## 2012-02-29 NOTE — Progress Notes (Signed)
  Radiation Oncology         [phone] ________________________________  Name: Billy Lopez MRN: [id number]  Date: 02/13/2012  DOB: 07-Dec-1940  End of Treatment Note  Diagnosis:   71 year old gentleman with stage T2a adenocarcinoma of the prostate with Gleason score of 3+4 and PSA of 5.61  Indication for treatment:  Curative       Radiation treatment dates:   12/17/2011 through 02/13/2012  Site/dose:   78 gray was delivered to the prostate in 40 fractions of 1.95 gray  Beams/energy:   6 megavolt photons were delivered using the TomoTherapy unit in helical intensity modulated radiotherapy pattern. The treatment was delivered using an inverse optimized plan. The patient was positioned daily with an alpha cradle device and image guidance was performed with a megavoltage CT prior to treatment each day.  Narrative: The patient tolerated radiation treatment relatively well.   He had minimal increased urinary frequency.  Plan: The patient has completed radiation treatment. The patient will return to radiation oncology clinic for routine followup in one month. I advised them to call or return sooner if they have any questions or concerns related to their recovery or treatment. ________________________________  [name], M.D.

## 2012-03-03 ENCOUNTER — Other Ambulatory Visit: Payer: Self-pay | Admitting: *Deleted

## 2012-03-03 DIAGNOSIS — M545 Low back pain: Secondary | ICD-10-CM

## 2012-03-03 MED ORDER — NAPROXEN 500 MG PO TABS: 500.0000 mg | ORAL_TABLET | Freq: Two times a day (BID) | ORAL | Status: DC

## 2012-03-05 ENCOUNTER — Other Ambulatory Visit: Payer: PRIVATE HEALTH INSURANCE

## 2012-03-05 DIAGNOSIS — B20 Human immunodeficiency virus [HIV] disease: Secondary | ICD-10-CM

## 2012-03-05 LAB — CBC WITH DIFFERENTIAL/PLATELET
Eosinophils Absolute: 0.1 10*3/uL (ref 0.0–0.7)
Eosinophils Relative: 2 % (ref 0–5)
Lymphs Abs: 0.9 10*3/uL (ref 0.7–4.0)
MCH: 29.8 pg (ref 26.0–34.0)
MCV: 88.1 fL (ref 78.0–100.0)
Platelets: 217 10*3/uL (ref 150–400)
RBC: 4.97 MIL/uL (ref 4.22–5.81)

## 2012-03-06 LAB — COMPREHENSIVE METABOLIC PANEL
ALT: 56 U/L — ABNORMAL HIGH (ref 0–53)
BUN: 19 mg/dL (ref 6–23)
CO2: 26 mEq/L (ref 19–32)
Creat: 1.83 mg/dL — ABNORMAL HIGH (ref 0.50–1.35)
Glucose, Bld: 133 mg/dL — ABNORMAL HIGH (ref 70–99)
Total Bilirubin: 0.5 mg/dL (ref 0.3–1.2)

## 2012-03-06 LAB — T-HELPER CELL (CD4) - (RCID CLINIC ONLY)
CD4 % Helper T Cell: 31 % — ABNORMAL LOW (ref 33–55)
CD4 T Cell Abs: 290 uL — ABNORMAL LOW (ref 400–2700)

## 2012-03-16 ENCOUNTER — Encounter: Payer: Self-pay | Admitting: Radiation Oncology

## 2012-03-16 DIAGNOSIS — Z923 Personal history of irradiation: Secondary | ICD-10-CM | POA: Insufficient documentation

## 2012-03-17 ENCOUNTER — Telehealth: Payer: Self-pay | Admitting: *Deleted

## 2012-03-17 ENCOUNTER — Telehealth: Payer: Self-pay

## 2012-03-17 NOTE — Telephone Encounter (Signed)
I decline to prescribed med other than tramadol as per my last note, given pt hx of polysubstance abuse, including heroin with withdrawal

## 2012-03-17 NOTE — Telephone Encounter (Signed)
Thank you Dr Luciana Axe. I will route this request to Dr Oliver Barre on pt's behalf and will FU on this request.

## 2012-03-17 NOTE — Telephone Encounter (Signed)
Per Dr Kathrynn Running, called [facility] w/refill on Hydrocodone/apap 5-500 mg, disp #60, no refills. Called pt and left vm to inform; left call back #.

## 2012-03-17 NOTE — Telephone Encounter (Signed)
I do not prescribe narcotic medicines.  His PCP is Dr. [name].

## 2012-03-17 NOTE — Telephone Encounter (Signed)
[facility] Called to inform the patient has requested a refill on his pain medication.  He just completed his cancer treatment on 02/13/12.  Informed the RN of PCP notes on this patient at [facility] Feb. 2013 regarding pain medication. They will send request to Dr. Kathrynn Running as unsure who should fill?

## 2012-03-17 NOTE — Telephone Encounter (Signed)
Pt called requesting refill on Hydrocodone 5-500 mg. Pt states he had picked up 2nd refill, and took the medicine w/him to his daughter's funeral. He states his "daughter passed away, and he left the bottle at the funeral home". Pt states he "has a couple pills left", takes Hydrocodone for "pains all over my body, my hips, my toes and fingers". Pt states he's had "2 hip surgeries, toe surgery, hernia surgery". He states "those pills help me a lot." Pt's PCP is Dr Staci Righter; will route this request to him and Dr Kathrynn Running. Pt informed that Dr Kathrynn Running is out of office today, will return 03/18/12. Pt verbalized understanding.

## 2012-03-18 ENCOUNTER — Ambulatory Visit: Payer: PRIVATE HEALTH INSURANCE | Admitting: Radiation Oncology

## 2012-03-19 ENCOUNTER — Encounter: Payer: Self-pay | Admitting: Internal Medicine

## 2012-03-19 ENCOUNTER — Ambulatory Visit (INDEPENDENT_AMBULATORY_CARE_PROVIDER_SITE_OTHER): Payer: Medicaid Other | Admitting: Internal Medicine

## 2012-03-19 ENCOUNTER — Ambulatory Visit: Payer: PRIVATE HEALTH INSURANCE | Admitting: Radiation Oncology

## 2012-03-19 VITALS — BP 121/71 | HR 92 | Temp 98.2°F | Ht 71.0 in | Wt 160.0 lb

## 2012-03-19 DIAGNOSIS — Z23 Encounter for immunization: Secondary | ICD-10-CM

## 2012-03-19 DIAGNOSIS — B1710 Acute hepatitis C without hepatic coma: Secondary | ICD-10-CM

## 2012-03-19 DIAGNOSIS — B20 Human immunodeficiency virus [HIV] disease: Secondary | ICD-10-CM

## 2012-03-20 LAB — BASIC METABOLIC PANEL WITH GFR
Chloride: 104 mEq/L (ref 96–112)
GFR, Est Non African American: 64 mL/min
Potassium: 4 mEq/L (ref 3.5–5.3)
Sodium: 135 mEq/L (ref 135–145)

## 2012-03-23 NOTE — Progress Notes (Signed)
  Subjective:    Patient ID: Billy Lopez, male    DOB: [birth year], 71 y.o.   MRN: [id number]  HPI Follow up of HIV.  On Atripla and remains undetectable.  Continues to have excellent compliance with medicine.  Is asking for Ensure through [facility].  No weight loss.  Has a primary doctor.  Creat elevated last visit.  Recently completed chemo/radiation for prostate cancer.  Feeling well.    Review of Systems  Constitutional: Negative for fever and fatigue.  HENT: Negative for sore throat and trouble swallowing.   Respiratory: Negative for cough and shortness of breath.   Cardiovascular: Negative for leg swelling.  Gastrointestinal: Negative for nausea, abdominal pain and diarrhea.  Genitourinary: Negative for hematuria and difficulty urinating.  Musculoskeletal: Negative for myalgias, joint swelling and arthralgias.  Neurological: Negative for dizziness and headaches.       Objective:   Physical Exam  Constitutional: He appears well-developed and well-nourished. No distress.  Cardiovascular: Normal rate, regular rhythm and normal heart sounds.  Exam reveals no gallop and no friction rub.   No murmur heard. Pulmonary/Chest: Effort normal and breath sounds normal. No respiratory distress. He has no wheezes. He has no rales.  Lymphadenopathy:    He has no cervical adenopathy.          Assessment & Plan:

## 2012-03-23 NOTE — Assessment & Plan Note (Addendum)
Active, no signs of cirrhosis though AST/ALT elevated.  Will consider referring to hepatologist.

## 2012-03-23 NOTE — Assessment & Plan Note (Signed)
Repeat creat wnl so will continue with current meds.  I will recheck in about 2 months to assure ok.

## 2012-03-27 ENCOUNTER — Telehealth: Payer: Self-pay | Admitting: *Deleted

## 2012-03-27 NOTE — Telephone Encounter (Signed)
Pt called stating he was told to double up on his Flomax and pharmacy would not refill.  Spoke w/Jamie, Rn, Dr Dahlstedt's office. She will call in refill to [facility], Tiffany Kocher for pt. Called pt back and informed; pt verbalized understanding.

## 2012-04-02 ENCOUNTER — Encounter: Payer: Self-pay | Admitting: Radiation Oncology

## 2012-04-02 ENCOUNTER — Ambulatory Visit: Payer: PRIVATE HEALTH INSURANCE | Admitting: Radiation Oncology

## 2012-04-02 ENCOUNTER — Ambulatory Visit
Admission: RE | Admit: 2012-04-02 | Discharge: 2012-04-02 | Disposition: A | Discharge status: Home / Self Care | Payer: PRIVATE HEALTH INSURANCE | Source: Ambulatory Visit | Attending: Radiation Oncology | Admitting: Radiation Oncology

## 2012-04-02 VITALS — BP 129/84 | HR 72 | Temp 98.5°F | Wt 160.9 lb

## 2012-04-02 DIAGNOSIS — C61 Malignant neoplasm of prostate: Secondary | ICD-10-CM

## 2012-04-02 MED ORDER — HYDROCODONE-ACETAMINOPHEN 5-325 MG PO TABS: 1.0000 | ORAL_TABLET | Freq: Four times a day (QID) | ORAL | Status: DC | PRN

## 2012-04-02 NOTE — Progress Notes (Signed)
  Radiation Oncology         [phone] ________________________________  Name: Billy Lopez MRN: [id number]  Date: 04/02/2012  DOB: November 30, 1940  Follow-Up Visit Note  CC: Oliver Barre, MD  Marcine Matar, MD  Diagnosis:   71 year old gentleman with stage T2a adenocarcinoma of the prostate with Gleason score of 3+4 and PSA of 5.61   Interval Since Last Radiation:  1 months  Narrative:  The patient returns today for routine follow-up.  No complaints.                              ALLERGIES:   has no known allergies.  Meds: Current Outpatient Prescriptions  Medication Sig Dispense Refill  . efavirenz-emtricitabine-tenofovir (ATRIPLA) 600-200-300 MG per tablet Take 1 tablet by mouth at bedtime.  31 tablet  4  . enalapril (VASOTEC) 20 MG tablet Take 1 tablet (20 mg total) by mouth daily.  30 tablet  11  . hydrochlorothiazide (HYDRODIURIL) 25 MG tablet Take 1 tablet (25 mg total) by mouth daily.  30 tablet  11  . HYDROcodone-acetaminophen (VICODIN) 5-500 MG per tablet Take 1-2 tablets by mouth every 6 (six) hours as needed for pain.  90 tablet  2  . loratadine (CLARITIN) 10 MG tablet Take 10 mg by mouth daily as needed. For allergies.      . naproxen (NAPROSYN) 500 MG tablet Take 1 tablet (500 mg total) by mouth 2 (two) times daily.  60 tablet  5  . pantoprazole (PROTONIX) 40 MG tablet Take 40 mg by mouth daily as needed. For indigestion.      . Tamsulosin HCl (FLOMAX) 0.4 MG CAPS Take 1 capsule (0.4 mg total) by mouth daily after supper.  30 capsule  5  . traMADol (ULTRAM) 50 MG tablet take 1 tablet by mouth every 6 hours if needed  120 tablet  2    Physical Findings: The patient is in no acute distress. Patient is alert and oriented.  weight is 160 lb 14.4 oz (72.984 kg). His temperature is 98.5 F (36.9 C). His blood pressure is 129/84 and his pulse is 72. .  No significant changes.  Impression:  The patient is recovering from the effects of radiation.   Plan:  Continue to  follow up on PSA response with Dr. Retta Diones.  He will continue to follow-up with urology for ongoing PSA determinations.  I will look forward to following his response through their correspondence, and be happy to participate in care if clinically indicated.  I talked to the patient about what to expect in the future, including his risk for erectile dysfunction and rectal bleeding.  I encouraged him to call or return to the office if he has any question about his previous radiation or possible radiation effects.  He was comfortable with this plan.  _____________________________________  [name], M.D.

## 2012-04-02 NOTE — Progress Notes (Signed)
Patient here for routine one month follow up completion of prostate radiation Denies pain, no urinary frequency.States flomax has helped with urine flow.Feels great.

## 2012-06-02 ENCOUNTER — Telehealth: Payer: Self-pay | Admitting: *Deleted

## 2012-06-02 ENCOUNTER — Encounter: Payer: Self-pay | Admitting: *Deleted

## 2012-06-02 NOTE — Telephone Encounter (Signed)
Pt called stating he is going to [place] 06/09/12 and needs refill on Hydrocodone before he leaves. He states he has pain in his shoulders, hip, thigh that is worse w/cold weather. Informed pt dr out of office until 06/04/12, will route his request to Dr Kathrynn Running. Pt verbalized understanding, agreement.

## 2012-06-02 NOTE — Progress Notes (Signed)
Per Dr Kathrynn Running, he informed pt he would no longer be able to fill his Hydrocodone when he saw pt in FU 04/02/12. Called pt and left vm to inform and advised he contact Dr [name] for management of his pain med. His request was routed Epic in-basket to Dr Excell Seltzer, pt's PCP.

## 2012-06-04 ENCOUNTER — Telehealth: Payer: Self-pay | Admitting: *Deleted

## 2012-06-04 NOTE — Telephone Encounter (Signed)
Unable to reach pt to return call. Will attempt tomorrow.

## 2012-06-05 ENCOUNTER — Telehealth: Payer: Self-pay | Admitting: Internal Medicine

## 2012-06-05 ENCOUNTER — Telehealth: Payer: Self-pay | Admitting: *Deleted

## 2012-06-05 DIAGNOSIS — C61 Malignant neoplasm of prostate: Secondary | ICD-10-CM

## 2012-06-05 MED ORDER — HYDROCODONE-ACETAMINOPHEN 5-325 MG PO TABS: 1.0000 | ORAL_TABLET | Freq: Four times a day (QID) | ORAL | Status: DC | PRN

## 2012-06-05 NOTE — Telephone Encounter (Signed)
Patient called advised that Dr Kathrynn Running office told him to call here to get a refill on him Hydrocodone. Advised they will no longer fill it. Advised him that we did not fill it and he has a PCP. He advised we have in the past and that he need is for a little while to wean himself off. Advised him that I do not think Dr Luciana Axe will fill this for him and encouraged him to call his PCP Dr [name] but that I will ask Dr Luciana Axe and give him a call back. He further informed that he is now in a power wheel chair and was given his Ensure as he has lost some weight. Reminded him of his up coming appts and told him we will call him once we have an answer but that it is Friday after 4pm and he may not get an answer until Monday. Before I ended the call reminded him to call his PCP office and gave him the number.  He was using Norco 5/325 mg 1-2 tabs q6h prn pain.

## 2012-06-05 NOTE — Telephone Encounter (Signed)
Patient was getting hydrocodone from Dr. Kathrynn Running but he was told that now he would need to get it from his PCP, he is requesting a refill on the Hydrocodone

## 2012-06-05 NOTE — Telephone Encounter (Signed)
Done hardcopy to robin  

## 2012-06-05 NOTE — Telephone Encounter (Signed)
Returned call from pt who is still requesting refill on Hydrocodone. Informed pt that Dr Kathrynn Running has referred him to his PCP, Dr Oliver Barre for his pain management. Reminded pt that Dr Kathrynn Running discussed this with him during his last FU on 04/02/12. Advised pt to contact Dr [name], gave pt phone # to dr's office. Pt states he will call Dr [name].

## 2012-06-06 NOTE — Telephone Encounter (Signed)
He got it from his PCP.

## 2012-06-08 ENCOUNTER — Telehealth: Payer: Self-pay

## 2012-06-08 ENCOUNTER — Other Ambulatory Visit: Payer: Self-pay | Admitting: Radiation Oncology

## 2012-06-08 NOTE — Telephone Encounter (Signed)
Done hardcopy to robin  

## 2012-06-08 NOTE — Telephone Encounter (Signed)
Faxed hardcopy to pharmacy. 

## 2012-06-08 NOTE — Telephone Encounter (Signed)
The patient was approved for a wheel chair and needs a rx for one.  Mail rx to home.

## 2012-06-09 NOTE — Telephone Encounter (Signed)
Mailed rx to home.

## 2012-06-10 ENCOUNTER — Telehealth: Payer: Self-pay

## 2012-06-10 NOTE — Telephone Encounter (Signed)
Faxed rx to [facility]. 

## 2012-06-10 NOTE — Telephone Encounter (Signed)
[facility] requesting a precription faxed to them for a raised toilet seat.  Fax number is [phone] please advise

## 2012-06-10 NOTE — Telephone Encounter (Signed)
Done hardcopy to robin  

## 2012-06-11 ENCOUNTER — Other Ambulatory Visit: Payer: Self-pay | Admitting: Internal Medicine

## 2012-06-11 ENCOUNTER — Telehealth: Payer: Self-pay

## 2012-06-11 ENCOUNTER — Telehealth: Payer: Self-pay | Admitting: Internal Medicine

## 2012-06-11 NOTE — Telephone Encounter (Signed)
Called the patient to inform we mailed rx for power wheel chair on Monday Jan. 6, 2014 as requested by the patient.  Informed the patient he should be getting in the mail soon.

## 2012-06-11 NOTE — Telephone Encounter (Signed)
Patient Information:  Caller Name: [place]  Phone: [phone]  Patient: Billy Lopez, Billy Lopez  Gender: Male  DOB: May 15, 1941  Age: 72 Years  PCP: Oliver Barre (Adults only)  Office Follow Up:  Does the office need to follow up with this patient?: No  Instructions For The Office: N/A   Symptoms  Reason For Call & Symptoms: Calling to see if Rx for Electronic Wheelchair has been sent. He has not recieved it in the mail yet. Advised that it was sent on 06/08/12 and he should recieve it today 06/11/12. Guideline(s) Used:  No Protocol Available - Information Only  Information only question and nurse able to answer  Advice Given:  Call Back with any other concerns

## 2012-06-14 ENCOUNTER — Emergency Department (HOSPITAL_COMMUNITY)
Admission: EM | Admit: 2012-06-14 | Discharge: 2012-06-15 | Disposition: A | Discharge status: Home / Self Care | Payer: PRIVATE HEALTH INSURANCE | Attending: Emergency Medicine | Admitting: Emergency Medicine

## 2012-06-14 ENCOUNTER — Encounter (HOSPITAL_COMMUNITY): Payer: Self-pay | Admitting: Emergency Medicine

## 2012-06-14 DIAGNOSIS — J4489 Other specified chronic obstructive pulmonary disease: Secondary | ICD-10-CM | POA: Insufficient documentation

## 2012-06-14 DIAGNOSIS — Z87891 Personal history of nicotine dependence: Secondary | ICD-10-CM | POA: Insufficient documentation

## 2012-06-14 DIAGNOSIS — F1910 Other psychoactive substance abuse, uncomplicated: Secondary | ICD-10-CM | POA: Insufficient documentation

## 2012-06-14 DIAGNOSIS — M47817 Spondylosis without myelopathy or radiculopathy, lumbosacral region: Secondary | ICD-10-CM | POA: Insufficient documentation

## 2012-06-14 DIAGNOSIS — Z79899 Other long term (current) drug therapy: Secondary | ICD-10-CM | POA: Insufficient documentation

## 2012-06-14 DIAGNOSIS — I10 Essential (primary) hypertension: Secondary | ICD-10-CM | POA: Insufficient documentation

## 2012-06-14 DIAGNOSIS — B20 Human immunodeficiency virus [HIV] disease: Secondary | ICD-10-CM | POA: Insufficient documentation

## 2012-06-14 DIAGNOSIS — Z923 Personal history of irradiation: Secondary | ICD-10-CM | POA: Insufficient documentation

## 2012-06-14 DIAGNOSIS — K5289 Other specified noninfective gastroenteritis and colitis: Secondary | ICD-10-CM | POA: Insufficient documentation

## 2012-06-14 DIAGNOSIS — H9190 Unspecified hearing loss, unspecified ear: Secondary | ICD-10-CM | POA: Insufficient documentation

## 2012-06-14 DIAGNOSIS — M1990 Unspecified osteoarthritis, unspecified site: Secondary | ICD-10-CM | POA: Insufficient documentation

## 2012-06-14 DIAGNOSIS — J449 Chronic obstructive pulmonary disease, unspecified: Secondary | ICD-10-CM | POA: Insufficient documentation

## 2012-06-14 DIAGNOSIS — K529 Noninfective gastroenteritis and colitis, unspecified: Secondary | ICD-10-CM

## 2012-06-14 DIAGNOSIS — M47812 Spondylosis without myelopathy or radiculopathy, cervical region: Secondary | ICD-10-CM | POA: Insufficient documentation

## 2012-06-14 DIAGNOSIS — Z8546 Personal history of malignant neoplasm of prostate: Secondary | ICD-10-CM | POA: Insufficient documentation

## 2012-06-14 DIAGNOSIS — R1110 Vomiting, unspecified: Secondary | ICD-10-CM | POA: Insufficient documentation

## 2012-06-14 LAB — CBC WITH DIFFERENTIAL/PLATELET
HCT: 37.9 % — ABNORMAL LOW (ref 39.0–52.0)
Hemoglobin: 13 g/dL (ref 13.0–17.0)
Lymphocytes Relative: 32 % (ref 12–46)
Monocytes Absolute: 0.6 10*3/uL (ref 0.1–1.0)
Monocytes Relative: 16 % — ABNORMAL HIGH (ref 3–12)
Neutro Abs: 1.8 10*3/uL (ref 1.7–7.7)
Neutrophils Relative %: 46 % (ref 43–77)
RBC: 4.27 MIL/uL (ref 4.22–5.81)
WBC: 3.8 10*3/uL — ABNORMAL LOW (ref 4.0–10.5)

## 2012-06-14 LAB — COMPREHENSIVE METABOLIC PANEL
AST: 46 U/L — ABNORMAL HIGH (ref 0–37)
Alkaline Phosphatase: 94 U/L (ref 39–117)
BUN: 12 mg/dL (ref 6–23)
CO2: 23 mEq/L (ref 19–32)
Chloride: 102 mEq/L (ref 96–112)
Creatinine, Ser: 1.1 mg/dL (ref 0.50–1.35)
GFR calc non Af Amer: 67 mL/min — ABNORMAL LOW (ref >90)
Potassium: 3.8 mEq/L (ref 3.5–5.1)
Total Bilirubin: 0.2 mg/dL — ABNORMAL LOW (ref 0.3–1.2)

## 2012-06-14 MED ORDER — EMTRICITABINE 200 MG PO CAPS: 200.0000 mg | ORAL_CAPSULE | Freq: Every day | ORAL | Status: DC

## 2012-06-14 MED ORDER — EFAVIRENZ-EMTRICITAB-TENOFOVIR 600-200-300 MG PO TABS: 1.0000 | ORAL_TABLET | Freq: Every day | ORAL | Status: DC

## 2012-06-14 MED ORDER — TENOFOVIR DISOPROXIL FUMARATE 300 MG PO TABS: 300.0000 mg | ORAL_TABLET | Freq: Every day | ORAL | Status: DC

## 2012-06-14 MED ORDER — EFAVIRENZ-EMTRICITAB-TENOFOVIR 600-200-300 MG PO TABS
1.0000 | ORAL_TABLET | Freq: Every day | ORAL | Status: DC
  Administered 2012-06-14: 1 via ORAL
  Filled 2012-06-14: qty 1

## 2012-06-14 MED ORDER — ONDANSETRON HCL 4 MG/2ML IJ SOLN
4.0000 mg | Freq: Once | INTRAMUSCULAR | Status: AC
  Administered 2012-06-14: 4 mg via INTRAVENOUS
  Filled 2012-06-14: qty 2

## 2012-06-14 MED ORDER — EFAVIRENZ 600 MG PO TABS
600.0000 mg | ORAL_TABLET | Freq: Every day | ORAL | Status: DC
  Filled 2012-06-14: qty 1

## 2012-06-14 MED ORDER — ONDANSETRON 8 MG PO TBDP: 8.0000 mg | ORAL_TABLET | Freq: Three times a day (TID) | ORAL | Status: DC | PRN

## 2012-06-14 MED ORDER — DIPHENOXYLATE-ATROPINE 2.5-0.025 MG PO TABS: 1.0000 | ORAL_TABLET | Freq: Four times a day (QID) | ORAL | Status: DC | PRN

## 2012-06-14 MED ORDER — SODIUM CHLORIDE 0.9 % IV BOLUS (SEPSIS)
500.0000 mL | Freq: Once | INTRAVENOUS | Status: AC
  Administered 2012-06-14: 500 mL via INTRAVENOUS

## 2012-06-14 NOTE — ED Notes (Signed)
Pt presents w/ c/o nausea, vomiting, and diarrhea which started on Thursday. Pt has continued to try to eat regular food and has continued to have vomiting. Pt continues on with multiple complaints, cramping in his foot, I had radiation that I finished 4 mos ago. Pt talks about the molding in an apartment building wall the entire time he is talking to me

## 2012-06-14 NOTE — ED Provider Notes (Signed)
History     CSN: [id number]  Arrival date & time 06/14/12  1801   First MD Initiated Contact with Patient 06/14/12 1857      Chief Complaint  Patient presents with  . Diarrhea  . Emesis     HPI Pt presents w/ c/o nausea, vomiting, and diarrhea which started on Thursday. Pt has continued to try to eat regular food and has continued to have vomiting. Pt continues on with multiple complaints, cramping in his foot, I had radiation that I finished 4 mos ago  Past Medical History  Diagnosis Date  . Hypertension   . HIV infection   . Substance abuse   . 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 Allergic rhinitis, cause unspecified 07/21/2011  . Cervical spondylosis 07/21/2011  . Lumbar spondylosis 07/21/2011  . Osteoarthritis 07/21/2011  . COPD (chronic obstructive pulmonary disease) 07/21/2011  . 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 Hx of radiation therapy 12/17/11 - 02/13/12    prostate  . Prostate cancer 09/19/11    gleason 7    Past Surgical History  Procedure Date  . Hip surgery     total bilateral replacement  . Hernia repair   . Hand surgery     right  . Foot surgery     bilat. ingrown toenails removed    Family History  Problem Relation Age of Onset  . Alcohol abuse Other   . Arthritis Other   . Hypertension Other     History  Substance Use Topics  . Smoking status: Former Smoker -- 1.0 packs/day for 10 years    Types: Cigarettes    Quit date: 10/13/1960  . Smokeless tobacco: Never Used  . Alcohol Use: No      Review of Systems  All other systems reviewed and are negative.   All other systems reviewed and are negative Allergies  Review of patient's allergies indicates no known allergies.  Home Medications   Current Outpatient Rx  Name  Route  Sig  Dispense  Refill  . VITAMIN D 1000 UNITS PO TABS   Oral   Take 1,000 Units by mouth every evening.         Marland Kitchen EFAVIRENZ-EMTRICITAB-TENOFOVIR 600-200-300 MG PO TABS   Oral   Take 1 tablet by mouth at bedtime.  31 tablet   4   . ENALAPRIL MALEATE 20 MG PO TABS   Oral   Take 20 mg by mouth every morning.         Marland Kitchen HYDROCHLOROTHIAZIDE 25 MG PO TABS   Oral   Take 25 mg by mouth every morning.         Marland Kitchen HYDROCODONE-ACETAMINOPHEN 5-325 MG PO TABS   Oral   Take 1-2 tablets by mouth every 6 (six) hours as needed for pain.   100 tablet   2   . LORATADINE 10 MG PO TABS   Oral   Take 10 mg by mouth daily as needed. For allergies.         Marland Kitchen NAPROXEN 500 MG PO TABS   Oral   Take 1 tablet (500 mg total) by mouth 2 (two) times daily.   60 tablet   5   . PANTOPRAZOLE SODIUM 40 MG PO TBEC   Oral   Take 40 mg by mouth daily as needed. For indigestion.         Marland Kitchen TAMSULOSIN HCL 0.4 MG PO CAPS   Oral   Take 1 capsule (0.4 mg total) by mouth daily after supper.  30 capsule   5   . TRAMADOL HCL 50 MG PO TABS   Oral   Take 50 mg by mouth every 6 (six) hours as needed. pain         . VITAMIN A 40981 UNITS PO CAPS   Oral   Take 10,000 Units by mouth every evening.         Marland Kitchen VITAMIN C 500 MG PO TABS   Oral   Take 500 mg by mouth every evening.         Marland Kitchen DIPHENOXYLATE-ATROPINE 2.5-0.025 MG PO TABS   Oral   Take 1 tablet by mouth 4 (four) times daily as needed for diarrhea or loose stools.   30 tablet   0   . DIPHENOXYLATE-ATROPINE 2.5-0.025 MG PO TABS   Oral   Take 1 tablet by mouth 4 (four) times daily as needed for diarrhea or loose stools.   30 tablet   0   . DIPHENOXYLATE-ATROPINE 2.5-0.025 MG PO TABS   Oral   Take 1 tablet by mouth 4 (four) times daily as needed for diarrhea or loose stools.   30 tablet   0   . EFAVIRENZ-EMTRICITAB-TENOFOVIR 600-200-300 MG PO TABS   Oral   Take 1 tablet by mouth at bedtime.   10 tablet   0   . EFAVIRENZ-EMTRICITAB-TENOFOVIR 600-200-300 MG PO TABS   Oral   Take 1 tablet by mouth at bedtime.   10 tablet   0   . EFAVIRENZ-EMTRICITAB-TENOFOVIR 600-200-300 MG PO TABS   Oral   Take 1 tablet by mouth at bedtime.   10  tablet   0   . EFAVIRENZ-EMTRICITAB-TENOFOVIR 600-200-300 MG PO TABS   Oral   Take 1 tablet by mouth at bedtime.   10 tablet   0   . ONDANSETRON 8 MG PO TBDP   Oral   Take 1 tablet (8 mg total) by mouth every 8 (eight) hours as needed for nausea.   20 tablet   0   . ONDANSETRON 8 MG PO TBDP   Oral   Take 1 tablet (8 mg total) by mouth every 8 (eight) hours as needed for nausea.   20 tablet   0   . ONDANSETRON 8 MG PO TBDP   Oral   Take 1 tablet (8 mg total) by mouth every 8 (eight) hours as needed for nausea.   20 tablet   0   . ONDANSETRON 8 MG PO TBDP   Oral   Take 1 tablet (8 mg total) by mouth every 8 (eight) hours as needed for nausea.   20 tablet   0     BP 152/90  Pulse 55  Temp 98.7 F (37.1 C) (Oral)  Resp 18  SpO2 95%  Physical Exam  Nursing note and vitals reviewed. Constitutional: He is oriented to person, place, and time. He appears well-developed and well-nourished. No distress.  HENT:  Head: Normocephalic and atraumatic.  Eyes: Pupils are equal, round, and reactive to light.  Neck: Normal range of motion.  Cardiovascular: Normal rate and intact distal pulses.   Pulmonary/Chest: No respiratory distress.  Abdominal: Normal appearance. He exhibits no distension. There is no tenderness. There is no rebound.  Musculoskeletal: Normal range of motion.  Neurological: He is alert and oriented to person, place, and time. No cranial nerve deficit.  Skin: Skin is warm and dry. No rash noted.  Psychiatric: He has a normal mood and affect. His behavior is normal.  ED Course  Procedures (including critical care time)  Medications  traMADol (ULTRAM) 50 MG tablet (not administered)  hydrochlorothiazide (HYDRODIURIL) 25 MG tablet (not administered)  enalapril (VASOTEC) 20 MG tablet (not administered)  vitamin C (ASCORBIC ACID) 500 MG tablet (not administered)  cholecalciferol (VITAMIN D) 1000 UNITS tablet (not administered)  vitamin A 16109 UNIT  capsule (not administered)  efavirenz-emtricitabine-tenofovir (ATRIPLA) 600-200-300 MG per tablet 1 tablet (1 tablet Oral Given 06/14/12 2213)  efavirenz-emtricitabine-tenofovir (ATRIPLA) 600-200-300 MG per tablet (not administered)  ondansetron (ZOFRAN ODT) 8 MG disintegrating tablet (not administered)  diphenoxylate-atropine (LOMOTIL) 2.5-0.025 MG per tablet (not administered)  ondansetron (ZOFRAN ODT) 8 MG disintegrating tablet (not administered)  efavirenz-emtricitabine-tenofovir (ATRIPLA) 600-200-300 MG per tablet (not administered)  ondansetron (ZOFRAN ODT) 8 MG disintegrating tablet (not administered)  efavirenz-emtricitabine-tenofovir (ATRIPLA) 600-200-300 MG per tablet (not administered)  ondansetron (ZOFRAN ODT) 8 MG disintegrating tablet (not administered)  diphenoxylate-atropine (LOMOTIL) 2.5-0.025 MG per tablet (not administered)  efavirenz-emtricitabine-tenofovir (ATRIPLA) 600-200-300 MG per tablet (not administered)  ondansetron (ZOFRAN ODT) 8 MG disintegrating tablet (not administered)  diphenoxylate-atropine (LOMOTIL) 2.5-0.025 MG per tablet (not administered)  sodium chloride 0.9 % bolus 500 mL (0 mL Intravenous Stopped 06/14/12 2213)  ondansetron (ZOFRAN) injection 4 mg (4 mg Intravenous Given 06/14/12 2029)    Labs Reviewed  CBC WITH DIFFERENTIAL - Abnormal; Notable for the following:    WBC 3.8 (*)     HCT 37.9 (*)     Monocytes Relative 16 (*)     All other components within normal limits  COMPREHENSIVE METABOLIC PANEL - Abnormal; Notable for the following:    Sodium 133 (*)     Glucose, Bld 105 (*)     Albumin 3.4 (*)     AST 46 (*)     Total Bilirubin 0.2 (*)     GFR calc non Af Amer 67 (*)     GFR calc Af Amer 77 (*)     All other components within normal limits   No results found.   1. Gastroenteritis   2. HIV (human immunodeficiency virus infection)       MDM   No nausea vomiting diarrhea while in the ER.  Patient reading a book or watching  television throughout his stay.  Stable go home.       Nelia Shi, MD 06/14/12 [phone]

## 2012-06-16 ENCOUNTER — Other Ambulatory Visit: Payer: Self-pay | Admitting: Licensed Clinical Social Worker

## 2012-06-16 DIAGNOSIS — B20 Human immunodeficiency virus [HIV] disease: Secondary | ICD-10-CM

## 2012-06-16 MED ORDER — EFAVIRENZ-EMTRICITAB-TENOFOVIR 600-200-300 MG PO TABS: 1.0000 | ORAL_TABLET | Freq: Every day | ORAL | Status: DC

## 2012-06-18 ENCOUNTER — Telehealth: Payer: Self-pay

## 2012-06-18 ENCOUNTER — Other Ambulatory Visit (INDEPENDENT_AMBULATORY_CARE_PROVIDER_SITE_OTHER): Payer: PRIVATE HEALTH INSURANCE

## 2012-06-18 DIAGNOSIS — B20 Human immunodeficiency virus [HIV] disease: Secondary | ICD-10-CM

## 2012-06-18 LAB — COMPLETE METABOLIC PANEL WITH GFR
ALT: 46 U/L (ref 0–53)
AST: 49 U/L — ABNORMAL HIGH (ref 0–37)
Albumin: 3.5 g/dL (ref 3.5–5.2)
Alkaline Phosphatase: 90 U/L (ref 39–117)
BUN: 17 mg/dL (ref 6–23)
Potassium: 4.2 mEq/L (ref 3.5–5.3)
Sodium: 139 mEq/L (ref 135–145)

## 2012-06-18 NOTE — Telephone Encounter (Signed)
Called the patient to inform the need to schedule appointment with Dr. [name] to complete form for power wheelchair.  The patient agree to do so and scheduled appointment on Jan. 20, 2014 at 3:30. Form is in folder on [employer].

## 2012-06-19 LAB — T-HELPER CELL (CD4) - (RCID CLINIC ONLY): CD4 T Cell Abs: 180 uL — ABNORMAL LOW (ref 400–2700)

## 2012-06-21 LAB — HIV-1 RNA QUANT-NO REFLEX-BLD: HIV-1 RNA Quant, Log: <1.3 {Log} (ref <1.30)

## 2012-06-22 ENCOUNTER — Ambulatory Visit: Payer: PRIVATE HEALTH INSURANCE | Admitting: Internal Medicine

## 2012-06-22 DIAGNOSIS — Z0289 Encounter for other administrative examinations: Secondary | ICD-10-CM

## 2012-07-02 ENCOUNTER — Ambulatory Visit: Payer: PRIVATE HEALTH INSURANCE | Admitting: Internal Medicine

## 2012-07-09 ENCOUNTER — Encounter: Payer: Self-pay | Admitting: Internal Medicine

## 2012-07-09 ENCOUNTER — Ambulatory Visit (INDEPENDENT_AMBULATORY_CARE_PROVIDER_SITE_OTHER): Payer: Medicaid Other | Admitting: Internal Medicine

## 2012-07-09 VITALS — BP 110/82 | HR 64 | Temp 98.0°F | Ht 71.0 in | Wt 173.5 lb

## 2012-07-09 DIAGNOSIS — G8929 Other chronic pain: Secondary | ICD-10-CM

## 2012-07-09 DIAGNOSIS — I10 Essential (primary) hypertension: Secondary | ICD-10-CM

## 2012-07-09 DIAGNOSIS — M1990 Unspecified osteoarthritis, unspecified site: Secondary | ICD-10-CM

## 2012-07-09 NOTE — Assessment & Plan Note (Signed)
ECG reviewed as per emr, stable overall by history and exam, recent data reviewed with pt, and pt to continue medical treatment as before,  to f/u any worsening symptoms or concerns BP Readings from Last 3 Encounters:  07/09/12 110/82  06/14/12 127/81  04/02/12 129/84

## 2012-07-09 NOTE — Patient Instructions (Addendum)
Your examination today will be documented for the mobility exam, and it appears to me that you should qualify for the power wheelchair Please continue all other medications as before, and refills have been done if requested. Please keep your appointments with your specialists as you normally do Please return in 6 months, or sooner if needed

## 2012-07-10 ENCOUNTER — Encounter: Payer: Self-pay | Admitting: Internal Medicine

## 2012-07-10 NOTE — Progress Notes (Signed)
Subjective:    Patient ID: Billy Lopez., male    DOB: 1940-07-15, 72 y.o.   MRN: [id number]  HPI  Here for mobility evaluation.  This patient would benefit from a powerchair to assist specifically with ADL's of cooking, cleaning and toileting and moving from room to room.  This pt has significant history of musculoskeletal impairments (arthritis as well as rotater cuff tears of both shoulders)  primarily with the bilat shoulders and bilat hips (less involving the bilat knees) that make use of cane and walker ruled out as options due to joint pain and impairment with range of motion, resulting in general debility and weakness not expected to improve with physical therapy or other treatment such as surgury. Pt cannot use cane or walker due to poor balance as well.  Pt has chronic pain requiring narcotic daily treatment, without which his pain is 8/10, and worse with flares of pain with above joint use and attempts at ambulation.  Manual wheelchair is ruled out as option due to pt bilateral severe shoulder degenerative joint disease and bilateral rotater cuff tears not amenable to surgical intervention, leading to joint range of motion severe impairment, pain with use - 8/10, and marked Upper extremity weakness (see physical exam).    Pt is unable to benefit from scooter due to space constraints in his low income living situation.  This patient does have the mental capacity to operate a power wheelchair safely, and is willing to use in the home. In the past month patient has fallen on several occasions in the bathroom while using the walker that he has.  Of note is significant chronic numbness to the lower extremities from his lower back and peripheral nerve disease that increases his risk of further falls, in addition to the bilat hip pain, knee pain and stiffness, and bilateral LE weakness.  Past Medical History  Diagnosis Date  . Hypertension   . HIV infection   . Substance abuse   . Heroin withdrawal  07/21/2011  . Allergic rhinitis, cause unspecified 07/21/2011  . Cervical spondylosis 07/21/2011  . Lumbar spondylosis 07/21/2011  . Osteoarthritis 07/21/2011  . COPD (chronic obstructive pulmonary disease) 07/21/2011  . 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 Hx of radiation therapy 12/17/11 - 02/13/12    prostate  . Prostate cancer 09/19/11    gleason 7   Past Surgical History  Procedure Date  . Hip surgery     total bilateral replacement  . Hernia repair   . Hand surgery     right  . Foot surgery     bilat. ingrown toenails removed    reports that he quit smoking about 51 years ago. His smoking use included Cigarettes. He has a 10 pack-year smoking history. He has never used smokeless tobacco. He reports that he uses illicit drugs (Heroin) about 7 times per week. He reports that he does not drink alcohol. family history includes Alcohol abuse in his other; Arthritis in his other; and Hypertension in his other. No Known Allergies Current Outpatient Prescriptions on File Prior to Visit  Medication Sig Dispense Refill  . cholecalciferol (VITAMIN D) 1000 UNITS tablet Take 1,000 Units by mouth every evening.      . diphenoxylate-atropine (LOMOTIL) 2.5-0.025 MG per tablet Take 1 tablet by mouth 4 (four) times daily as needed for diarrhea or loose stools.  30 tablet  0  . efavirenz-emtricitabine-tenofovir (ATRIPLA) 600-200-300 MG per tablet Take 1 tablet by mouth at bedtime.  10 tablet  0  . enalapril (VASOTEC) 20 MG tablet Take 20 mg by mouth every morning.      Marland Kitchen HYDROcodone-acetaminophen (NORCO/VICODIN) 5-325 MG per tablet Take 1-2 tablets by mouth every 6 (six) hours as needed for pain.  100 tablet  2  . loratadine (CLARITIN) 10 MG tablet Take 10 mg by mouth daily as needed. For allergies.      . naproxen (NAPROSYN) 500 MG tablet Take 1 tablet (500 mg total) by mouth 2 (two) times daily.  60 tablet  5  . ondansetron (ZOFRAN ODT) 8 MG disintegrating tablet Take 1 tablet (8 mg total) by mouth every  8 (eight) hours as needed for nausea.  20 tablet  0  . pantoprazole (PROTONIX) 40 MG tablet Take 40 mg by mouth daily as needed. For indigestion.      . Tamsulosin HCl (FLOMAX) 0.4 MG CAPS Take 1 capsule (0.4 mg total) by mouth daily after supper.  30 capsule  5  . traMADol (ULTRAM) 50 MG tablet Take 50 mg by mouth every 6 (six) hours as needed. pain      . vitamin A 96045 UNIT capsule Take 10,000 Units by mouth every evening.      . vitamin C (ASCORBIC ACID) 500 MG tablet Take 500 mg by mouth every evening.      . hydrochlorothiazide (HYDRODIURIL) 25 MG tablet Take 25 mg by mouth every morning.      . ondansetron (ZOFRAN ODT) 8 MG disintegrating tablet Take 1 tablet (8 mg total) by mouth every 8 (eight) hours as needed for nausea.  20 tablet  0  . ondansetron (ZOFRAN ODT) 8 MG disintegrating tablet Take 1 tablet (8 mg total) by mouth every 8 (eight) hours as needed for nausea.  20 tablet  0   Review of Systems  Constitutional: Negative for unexpected weight change, or unusual diaphoresis  HENT: Negative for tinnitus.   Eyes: Negative for photophobia and visual disturbance.  Respiratory: Negative for choking and stridor.   Gastrointestinal: Negative for vomiting and blood in stool.  Genitourinary: Negative for hematuria and decreased urine volume.  Musculoskeletal: Negative for acute joint swelling Skin: Negative for color change and wound.  Neurological: Negative for tremors and numbness other than noted  Psychiatric/Behavioral: Negative for decreased concentration or  hyperactivity.       Objective:   Physical Exam BP 110/82  Pulse 64  Temp 98 F (36.7 C) (Oral)  Ht 5\' 11"  (1.803 m)  Wt 173 lb 8 oz (78.699 kg)  BMI 24.20 kg/m2  SpO2 96% VS noted, chronic ill appearing Constitutional: Pt appears thin  HENT: Head: NCAT. No thrush Right Ear: External ear normal .  Left Ear: External ear normal.  Bilat tm's without erythema Eyes: Conjunctivae and EOM are normal. Pupils are equal,  round, and reactive to light.  Neck:  Neck supple but with decreased ROM by 10-15 degrees on extension as well as horizontal left and right movement Cardiovascular: Normal rate and regular rhythm.  No murmur Pulmonary/Chest: Effort normal and breath sounds normal. - no rales or wheezing Abd:  Soft, NT, non-distended, + BS Neurological: Pt is alert. Not confused , motor 3+/5 symmetrical upper and lower extremities;  DTR intact, has decreased sensation to LT bilat LE's to knee level, gait unsteady with balance difficulty after standing to walk by pushing off with arms Right shoulder - severe bony arthritic deformity palpated, abduction and forward elevation to 30 degrees only, ext and int rotation severely limited to movement to  10-15 degrees only Left shoulder - abduction and forward elevation to 20 degrees only, ext and int rotation likewise severely limited to 10-15 degrees only Right hip; extension not tested, flexion to 90 degrees only, int and ext rotation with severe pain to 20 degrees only Left hip:  Extension not tested, flexion to 80 degrees only, int and ext rotation likewise with severe pain to 30 degrees Bilat knees with crepitus, NT, non effusion and FROM except for right knee limited to 90 degrees flexion (has full extension) Skin: Skin is warm. No erythema.  Psychiatric: Pt behavior is normal. Thought content normal.     Assessment & Plan:

## 2012-07-10 NOTE — Assessment & Plan Note (Addendum)
With severe bilat shoulder deg changes and bilat rotater cuff tears not amenable to surgury or other tx, as well as bilat severe hip impairment and pain s/p bilat hip surguries also not amenable to surgury or other tx;  To cont pain med but also for powerchair as he would not be expected to improved with Physical Therapy and requires this in the home for safety and ADL's.  Note:  Total time for pt hx, exam, review of record with pt in the room, determination of diagnoses and plan for further eval and tx is > 40 min, with over 50% spent in coordination and counseling of patient

## 2012-07-14 ENCOUNTER — Ambulatory Visit (INDEPENDENT_AMBULATORY_CARE_PROVIDER_SITE_OTHER): Payer: PRIVATE HEALTH INSURANCE | Admitting: Internal Medicine

## 2012-07-14 ENCOUNTER — Encounter: Payer: Self-pay | Admitting: Internal Medicine

## 2012-07-14 VITALS — BP 127/85 | HR 77 | Temp 98.5°F | Ht 71.0 in | Wt 173.0 lb

## 2012-07-14 DIAGNOSIS — B20 Human immunodeficiency virus [HIV] disease: Secondary | ICD-10-CM

## 2012-07-14 DIAGNOSIS — B1710 Acute hepatitis C without hepatic coma: Secondary | ICD-10-CM

## 2012-07-14 NOTE — Progress Notes (Signed)
  Subjective:    Patient ID: Billy Grammes., male    DOB: 1940/07/10, 72 y.o.   MRN: [id number]  HPI  Follow up of HIV.  On Atripla and remains undetectable.  Continues to have excellent compliance with medicine.  Has a primary doctor.  Creat elevate previously but has been normal since. Recently completed chemo/radiation for prostate cancer.  Feeling well. Has Hepatitis C but asymptomatic.     Review of Systems  Constitutional: Negative for fever and fatigue.  HENT: Negative for sore throat and trouble swallowing.   Respiratory: Negative for cough and shortness of breath.   Cardiovascular: Negative for leg swelling.  Gastrointestinal: Negative for nausea, abdominal pain and diarrhea.  Genitourinary: Negative for hematuria and difficulty urinating.  Musculoskeletal: Negative for myalgias, joint swelling and arthralgias.  Neurological: Negative for dizziness and headaches.       Objective:   Physical Exam  Constitutional: He appears well-developed and well-nourished. No distress.  Cardiovascular: Normal rate, regular rhythm and normal heart sounds.  Exam reveals no gallop and no friction rub.   No murmur heard. Pulmonary/Chest: Effort normal and breath sounds normal. No respiratory distress. He has no wheezes. He has no rales.  Lymphadenopathy:    He has no cervical adenopathy.          Assessment & Plan:

## 2012-07-14 NOTE — Assessment & Plan Note (Signed)
He continues to do well with his therapy and will continue. He will return in 3 months for routine followup including monitoring his creatinine.

## 2012-07-14 NOTE — Patient Instructions (Signed)
Try Zyrtec for allergy symptoms.

## 2012-07-14 NOTE — Assessment & Plan Note (Signed)
No signs of cirrhosis with normal platelets at this time. Not interested in referral at this time for hepatologist. Billy Lopez continue to monitor

## 2012-07-15 ENCOUNTER — Telehealth: Payer: Self-pay | Admitting: Internal Medicine

## 2012-07-15 NOTE — Telephone Encounter (Signed)
Called the patient informed of MD instructions. 

## 2012-07-15 NOTE — Telephone Encounter (Signed)
Very sorry, but the policy is controlled substance prescriptions can only be replaced if stolen and a police report is available,  Pt is responsible for all prescriptions and bottle of medications

## 2012-07-15 NOTE — Telephone Encounter (Signed)
Patient calling about loss of medication.  Rx for #100 Hydrocodone in January, refilled last week for #100 tabs.  Usually takes 3 per day for arthritis, shoulder pain, hip and foot pain, Hx of prostate cancer.  Went to meeting yesterday 2/11 and took the medication with him.  Does not know where he mis-placed it but can't find the medication anywhere so his last pain pill was yesterday am.  Pain increasing since no meds to take. As[facility] [phone].

## 2012-07-20 ENCOUNTER — Telehealth: Payer: Self-pay | Admitting: Internal Medicine

## 2012-07-20 DIAGNOSIS — C61 Malignant neoplasm of prostate: Secondary | ICD-10-CM

## 2012-07-20 MED ORDER — HYDROCODONE-ACETAMINOPHEN 5-325 MG PO TABS: 1.0000 | ORAL_TABLET | Freq: Four times a day (QID) | ORAL | Status: DC | PRN

## 2012-07-20 NOTE — Telephone Encounter (Signed)
Done hardcopy to robin  

## 2012-07-20 NOTE — Telephone Encounter (Signed)
Patient picked up at the front desk 

## 2012-09-03 ENCOUNTER — Other Ambulatory Visit: Payer: Self-pay | Admitting: Internal Medicine

## 2012-09-03 NOTE — Telephone Encounter (Signed)
Done hardcopy to robin  

## 2012-09-03 NOTE — Telephone Encounter (Signed)
Faxed hardcopy to [facility] [place]. 

## 2012-09-09 ENCOUNTER — Other Ambulatory Visit: Payer: Self-pay | Admitting: *Deleted

## 2012-09-09 DIAGNOSIS — I10 Essential (primary) hypertension: Secondary | ICD-10-CM

## 2012-09-09 MED ORDER — ENALAPRIL MALEATE 20 MG PO TABS: 20.0000 mg | ORAL_TABLET | Freq: Every morning | ORAL | Status: DC

## 2012-09-09 MED ORDER — HYDROCHLOROTHIAZIDE 25 MG PO TABS: 25.0000 mg | ORAL_TABLET | Freq: Every day | ORAL | Status: DC

## 2012-09-16 ENCOUNTER — Other Ambulatory Visit: Payer: Self-pay | Admitting: Internal Medicine

## 2012-09-16 NOTE — Telephone Encounter (Signed)
norco already done earlier this month, please let pt know if he needs more already, he would likely benefit from pain clinic management

## 2012-09-17 NOTE — Telephone Encounter (Signed)
Called the patient left a detailed message of MD instructions. 

## 2012-09-18 ENCOUNTER — Other Ambulatory Visit: Payer: Self-pay | Admitting: Internal Medicine

## 2012-09-19 ENCOUNTER — Other Ambulatory Visit: Payer: Self-pay | Admitting: Internal Medicine

## 2012-09-21 ENCOUNTER — Other Ambulatory Visit: Payer: Self-pay | Admitting: Internal Medicine

## 2012-09-21 NOTE — Telephone Encounter (Signed)
Done erx 

## 2012-09-21 NOTE — Telephone Encounter (Signed)
Last rx just done early April 2014

## 2012-09-30 ENCOUNTER — Other Ambulatory Visit (INDEPENDENT_AMBULATORY_CARE_PROVIDER_SITE_OTHER): Payer: Medicare HMO

## 2012-09-30 ENCOUNTER — Other Ambulatory Visit: Payer: Self-pay | Admitting: Infectious Disease

## 2012-09-30 DIAGNOSIS — B20 Human immunodeficiency virus [HIV] disease: Secondary | ICD-10-CM

## 2012-09-30 LAB — CBC WITH DIFFERENTIAL/PLATELET
Eosinophils Relative: 3 % (ref 0–5)
HCT: 41.9 % (ref 39.0–52.0)
Lymphocytes Relative: 26 % (ref 12–46)
Lymphs Abs: 0.8 10*3/uL (ref 0.7–4.0)
MCV: 85.9 fL (ref 78.0–100.0)
Monocytes Absolute: 0.8 10*3/uL (ref 0.1–1.0)
Monocytes Relative: 27 % — ABNORMAL HIGH (ref 3–12)
RBC: 4.88 MIL/uL (ref 4.22–5.81)
WBC: 3 10*3/uL — ABNORMAL LOW (ref 4.0–10.5)

## 2012-09-30 LAB — COMPLETE METABOLIC PANEL WITH GFR
AST: 55 U/L — ABNORMAL HIGH (ref 0–37)
Albumin: 3.8 g/dL (ref 3.5–5.2)
Alkaline Phosphatase: 93 U/L (ref 39–117)
BUN: 12 mg/dL (ref 6–23)
Potassium: 3.9 mEq/L (ref 3.5–5.3)

## 2012-10-01 ENCOUNTER — Other Ambulatory Visit: Payer: Self-pay | Admitting: Radiation Oncology

## 2012-10-01 LAB — HIV-1 RNA QUANT-NO REFLEX-BLD: HIV 1 RNA Quant: <20 copies/mL (ref <20)

## 2012-10-01 NOTE — Telephone Encounter (Signed)
Patient called requesting refill on his flomax, , patient last seen by Dr,Mannng, 03/2012, he is not being followed in Rad onc, he last saw Dr.Dahlsteadt 08/24/12, and was to continue his flomax, patinet informed to call [facility] since he is being seen by that office to get refill on his flomax,pt thanked [place] and he gave [phone] phone number of [facility],stated he would call them 9:21 AM

## 2012-10-02 ENCOUNTER — Telehealth: Payer: Self-pay | Admitting: *Deleted

## 2012-10-02 NOTE — Telephone Encounter (Signed)
Called  patient to inform him that his flomax has bent sent to his pharmacy [facility] by Dr.Dahlstedt,patient thanked staff and MD for this ,will pick up today

## 2012-10-13 ENCOUNTER — Other Ambulatory Visit: Payer: Self-pay | Admitting: Internal Medicine

## 2012-10-13 ENCOUNTER — Encounter: Payer: Self-pay | Admitting: Internal Medicine

## 2012-10-13 ENCOUNTER — Ambulatory Visit (INDEPENDENT_AMBULATORY_CARE_PROVIDER_SITE_OTHER): Payer: Medicare PPO | Admitting: Internal Medicine

## 2012-10-13 VITALS — BP 117/75 | HR 93 | Temp 98.5°F | Ht 71.0 in | Wt 176.0 lb

## 2012-10-13 DIAGNOSIS — B1710 Acute hepatitis C without hepatic coma: Secondary | ICD-10-CM

## 2012-10-13 DIAGNOSIS — B20 Human immunodeficiency virus [HIV] disease: Secondary | ICD-10-CM

## 2012-10-13 DIAGNOSIS — G8929 Other chronic pain: Secondary | ICD-10-CM

## 2012-10-13 MED ORDER — EFAVIRENZ-EMTRICITAB-TENOFOVIR 600-200-300 MG PO TABS: 1.0000 | ORAL_TABLET | Freq: Every day | ORAL | Status: DC

## 2012-10-13 NOTE — Telephone Encounter (Signed)
Done hardcopy to robin  

## 2012-10-13 NOTE — Telephone Encounter (Signed)
Faxed hardcopy to [facility] [place] 

## 2012-10-14 ENCOUNTER — Encounter: Payer: Self-pay | Admitting: Internal Medicine

## 2012-10-14 ENCOUNTER — Ambulatory Visit (INDEPENDENT_AMBULATORY_CARE_PROVIDER_SITE_OTHER): Payer: Medicare PPO | Admitting: Internal Medicine

## 2012-10-14 VITALS — BP 110/70 | HR 69 | Temp 98.8°F | Wt 176.0 lb

## 2012-10-14 DIAGNOSIS — G8929 Other chronic pain: Secondary | ICD-10-CM

## 2012-10-14 DIAGNOSIS — Z0000 Encounter for general adult medical examination without abnormal findings: Secondary | ICD-10-CM

## 2012-10-14 DIAGNOSIS — B1710 Acute hepatitis C without hepatic coma: Secondary | ICD-10-CM

## 2012-10-14 DIAGNOSIS — K921 Melena: Secondary | ICD-10-CM

## 2012-10-14 DIAGNOSIS — J309 Allergic rhinitis, unspecified: Secondary | ICD-10-CM

## 2012-10-14 NOTE — Patient Instructions (Addendum)
Your pain medication prescription was refilled to the [facility] on [address] Your claritin was also refilled There are no other new medications today , and no need for further blood tests Please continue all other medications as before, and refills have been done if requested. Please have the pharmacy call with any other refills you may need. You will be contacted regarding the referral for: Gastroenterology, as well as Hepatitis C Clinic since you are now interested in this Please continue your efforts at being more active, low cholesterol diet, and weight control. Please return in 6 months, or sooner if needed, with Lab testing done 3-5 days before

## 2012-10-14 NOTE — Assessment & Plan Note (Signed)
Reminded him that I do not prescribe pain medications for him.

## 2012-10-14 NOTE — Assessment & Plan Note (Signed)
For Hep C clinic referral,  to f/u any worsening symptoms or concerns

## 2012-10-14 NOTE — Assessment & Plan Note (Signed)
For GI referral, likely needs colonoscopy 

## 2012-10-14 NOTE — Assessment & Plan Note (Signed)
stable overall by history and exam, recent data reviewed with pt, and pt to continue medical treatment as before,  to f/u any worsening symptoms or concerns, for med refill 

## 2012-10-14 NOTE — Progress Notes (Signed)
Subjective:    Patient ID: Billy Grammes., male    DOB: 1940/07/06, 72 y.o.   MRN: [id number]  HPI  Here to f/u, "I havent been feeling too well."  C/o some BRBPR mixed with stool, also on tissue, ongoing for 6-8 wks, only occurs with the BM episode, normal stool consistency otherwise - no straining or tenesmus, has some mild discomfort at the time, denies constipation.  Had episode n/v x1 after eating out on Mothers day, but none since then.  No fever. No other bleeding or bruising.  Last HIV RNA quant negative, cd4 -  250. Saw ID yesterday. Note states pt did not want Hep C referral, but today states ok for this.  No colonoscopy on file, pt cannot remember any in the last 10 yrs. Does have several wks ongoing nasal allergy symptoms with clearish congestion, itch and sneezing, without fever, pain, ST, cough, swelling or wheezing. Due for pain med refill.  Last hgb normal apr 30 denies orthostasis otday Past Medical History  Diagnosis Date  . Hypertension   . HIV infection   . Substance abuse   . 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 Allergic rhinitis, cause unspecified 07/21/2011  . Cervical spondylosis 07/21/2011  . Lumbar spondylosis 07/21/2011  . Osteoarthritis 07/21/2011  . COPD (chronic obstructive pulmonary disease) 07/21/2011  . 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 Hx of radiation therapy 12/17/11 - 02/13/12    prostate  . Prostate cancer 09/19/11    gleason 7   Past Surgical History  Procedure Laterality Date  . Hip surgery      total bilateral replacement  . Hernia repair    . Hand surgery      right  . Foot surgery      bilat. ingrown toenails removed    reports that he quit smoking about 52 years ago. His smoking use included Cigarettes. He has a 10 pack-year smoking history. He has never used smokeless tobacco. He reports that he uses illicit drugs (Heroin) about 7 times per week. He reports that he does not drink alcohol. family history includes Alcohol abuse in his other; Arthritis  in his other; and Hypertension in his other. No Known Allergies   Current Outpatient Prescriptions on File Prior to Visit  Medication Sig Dispense Refill  . cholecalciferol (VITAMIN D) 1000 UNITS tablet Take 1,000 Units by mouth every evening.      . diphenoxylate-atropine (LOMOTIL) 2.5-0.025 MG per tablet Take 1 tablet by mouth 4 (four) times daily as needed for diarrhea or loose stools.  30 tablet  0  . efavirenz-emtricitabine-tenofovir (ATRIPLA) 600-200-300 MG per tablet Take 1 tablet by mouth at bedtime.  30 tablet  11  . enalapril (VASOTEC) 20 MG tablet Take 1 tablet (20 mg total) by mouth every morning.  90 tablet  3  . hydrochlorothiazide (HYDRODIURIL) 25 MG tablet Take 1 tablet (25 mg total) by mouth daily.  90 tablet  3  . HYDROcodone-acetaminophen (NORCO/VICODIN) 5-325 MG per tablet TAKE 1-2 TABLETS BY MOUTH  EVERY 6 HOURS AS NEEDED FOR PAIN  100 tablet  0  . loratadine (CLARITIN) 10 MG tablet Take 10 mg by mouth daily as needed. For allergies.      . naproxen (NAPROSYN) 500 MG tablet Take 1 tablet (500 mg total) by mouth 2 (two) times daily.  60 tablet  5  . ondansetron (ZOFRAN ODT) 8 MG disintegrating tablet Take 1 tablet (8 mg total) by mouth every 8 (eight) hours as needed for  nausea.  20 tablet  0  . pantoprazole (PROTONIX) 40 MG tablet Take 40 mg by mouth daily as needed. For indigestion.      . tamsulosin (FLOMAX) 0.4 MG CAPS take 1 capsule by mouth twice a day  60 capsule  0  . traMADol (ULTRAM) 50 MG tablet Take 50 mg by mouth every 6 (six) hours as needed. pain      . vitamin A 56213 UNIT capsule Take 10,000 Units by mouth every evening.      . vitamin C (ASCORBIC ACID) 500 MG tablet Take 500 mg by mouth every evening.       No current facility-administered medications on file prior to visit.   Review of Systems  Constitutional: Negative for unexpected weight change, or unusual diaphoresis  HENT: Negative for tinnitus.   Eyes: Negative for photophobia and visual  disturbance.  Respiratory: Negative for choking and stridor.   Gastrointestinal: Negative for vomiting and blood in stool.  Genitourinary: Negative for hematuria and decreased urine volume.  Musculoskeletal: Negative for acute joint swelling Skin: Negative for color change and wound.  Neurological: Negative for tremors and numbness other than noted  Psychiatric/Behavioral: Negative for decreased concentration or  hyperactivity.       Objective:   Physical Exam BP 110/70  Pulse 69  Temp(Src) 98.8 F (37.1 C) (Oral)  Wt 176 lb (79.833 kg)  BMI 24.56 kg/m2  SpO2 90% VS noted,  Constitutional: Pt appears well-developed and well-nourished.  HENT: Head: NCAT.  Right Ear: External ear normal.  Left Ear: External ear normal.  Eyes: Conjunctivae and EOM are normal. Pupils are equal, round, and reactive to light.  Neck: Normal range of motion. Neck supple.  Cardiovascular: Normal rate and regular rhythm.   Pulmonary/Chest: Effort normal and breath sounds normal.  Abd:  Soft, NT, non-distended, + BS Neurological: Pt is alert. Not confused  Skin: Skin is warm. No erythema.  Psychiatric: Pt behavior is normal. Thought content normal. 1+ nervous    Assessment & Plan:

## 2012-10-14 NOTE — Assessment & Plan Note (Signed)
Ok for claritin qd prn,  to f/u any worsening symptoms or concerns

## 2012-10-14 NOTE — Assessment & Plan Note (Addendum)
Doing well with improved CD4.  Creat stable.  Continue Atripla.  RTC 4 months.    Had weight gain.  No indication for Ensure.

## 2012-10-14 NOTE — Assessment & Plan Note (Signed)
Not interested in hepatology referral at this time.

## 2012-10-14 NOTE — Progress Notes (Signed)
  Subjective:    Patient ID: Billy Grammes., male    DOB: [birth year], 72 y.o.   MRN: [id number]  HPI Follow up of HIV. On Atripla and remains undetectable. Continues to have excellent compliance with medicine. CD4 up to 250.  Has a primary doctor. Creat elevate previously but has been normal since. Complaining about not having pain medication.  Asking for Ensure, though eats well and weight good.  Has a scooter due to chronic pain.      Review of Systems  Constitutional: Negative for fever, activity change, appetite change, fatigue and unexpected weight change.  HENT: Negative for sore throat and trouble swallowing.   Gastrointestinal: Negative for nausea, abdominal pain and diarrhea.  Musculoskeletal: Positive for myalgias, back pain and arthralgias. Negative for joint swelling.  Skin: Negative for rash.  Neurological: Negative for dizziness and headaches.       Objective:   Physical Exam  Constitutional: He appears well-developed and well-nourished. No distress.  HENT:  Mouth/Throat: Oropharynx is clear and moist. No oropharyngeal exudate.  Cardiovascular: Normal rate, regular rhythm and normal heart sounds.  Exam reveals no gallop and no friction rub.   No murmur heard. Pulmonary/Chest: Effort normal and breath sounds normal. No respiratory distress. He has no wheezes. He has no rales.  Musculoskeletal:  Walks well with cane  Skin: No rash noted.          Assessment & Plan:

## 2012-10-16 ENCOUNTER — Encounter: Payer: Self-pay | Admitting: Gastroenterology

## 2012-11-02 ENCOUNTER — Encounter: Payer: Self-pay | Admitting: Gastroenterology

## 2012-11-02 ENCOUNTER — Other Ambulatory Visit: Payer: Self-pay

## 2012-11-02 ENCOUNTER — Other Ambulatory Visit: Payer: Self-pay | Admitting: Licensed Clinical Social Worker

## 2012-11-02 ENCOUNTER — Ambulatory Visit (INDEPENDENT_AMBULATORY_CARE_PROVIDER_SITE_OTHER): Payer: Medicare PPO | Admitting: Gastroenterology

## 2012-11-02 VITALS — BP 120/78 | HR 80 | Ht 68.0 in | Wt 168.0 lb

## 2012-11-02 DIAGNOSIS — K921 Melena: Secondary | ICD-10-CM

## 2012-11-02 DIAGNOSIS — B182 Chronic viral hepatitis C: Secondary | ICD-10-CM

## 2012-11-02 DIAGNOSIS — M545 Low back pain: Secondary | ICD-10-CM

## 2012-11-02 MED ORDER — MOVIPREP 100 G PO SOLR: 1.0000 | Freq: Once | ORAL | Status: DC

## 2012-11-02 MED ORDER — NAPROXEN 500 MG PO TABS: 500.0000 mg | ORAL_TABLET | Freq: Two times a day (BID) | ORAL | Status: DC

## 2012-11-02 NOTE — Progress Notes (Signed)
History of Present Illness: This is a 72-year-old male who has completed 40 sessions of radiation therapy for prostate cancer in mid 2013. He notes small amounts of bright red blood per rectum with all bowel movements. The symptoms began shortly following his radiation treatments. His symptoms haven't changed. He has HIV and HCV. He has not previously had a colonoscopy. Denies weight loss, abdominal pain, constipation, diarrhea, change in stool caliber, melena, nausea, vomiting, dysphagia, reflux symptoms, chest pain.  Review of Systems: Pertinent positive and negative review of systems were noted in the above HPI section. All other review of systems were otherwise negative.  Current Medications, Allergies, Past Medical History, Past Surgical History, Family History and Social History were reviewed in [facility] electronic medical record.  Physical Exam: General: Well developed , well nourished, no acute distress Head: Normocephalic and atraumatic Eyes:  sclerae anicteric, EOMI Ears: Normal auditory acuity Mouth: No deformity or lesions Neck: Supple, no masses or thyromegaly Lungs: Clear throughout to auscultation Heart: Regular rate and rhythm; no murmurs, rubs or bruits Abdomen: Soft, non tender and non distended. No masses, hepatosplenomegaly or hernias noted. Normal Bowel sounds Rectal: No external or internal lesions, no tenderness, 3+ Hemoccult positive dark brown stool Musculoskeletal: Symmetrical with no gross deformities  Skin: No lesions on visible extremities Pulses:  Normal pulses noted Extremities: No clubbing, cyanosis, edema or deformities noted Neurological: Alert oriented x 4, grossly nonfocal Cervical Nodes:  No significant cervical adenopathy Inguinal Nodes: No significant inguinal adenopathy Psychological:  Alert and cooperative. Normal mood and affect  Assessment and Recommendations:  1. Hematochezia post prostate radiation therapy. Hemoccult positive stool. Rule  out radiation proctitis, colorectal neoplasms and other disorders The risks, benefits, and alternatives to colonoscopy with possible biopsy, possible APC therapy and possible polypectomy were discussed with the patient and they consent to proceed.   2. HIV disease managed by ID.  3. HCV. He states he has an appointment in early July with a hepatologist.  4. COPD.  

## 2012-11-02 NOTE — Patient Instructions (Addendum)
You have been scheduled for a colonoscopy with propofol. Please follow written instructions given to you at your visit today.  Please pick up your prep kit at the pharmacy within the next 1-3 days. If you use inhalers (even only as needed), please bring them with you on the day of your procedure. Your physician has requested that you go to www.startemmi.com and enter the access code given to you at your visit today. This web site gives a general overview about your procedure. However, you should still follow specific instructions given to you by our office regarding your preparation for the procedure. CC:  Oliver Barre MD

## 2012-11-16 ENCOUNTER — Other Ambulatory Visit: Payer: Self-pay | Admitting: Internal Medicine

## 2012-11-16 NOTE — Telephone Encounter (Signed)
Done hardcopy to robin  

## 2012-11-16 NOTE — Telephone Encounter (Signed)
Faxed hardcopy to [employer]

## 2012-11-17 ENCOUNTER — Encounter (HOSPITAL_COMMUNITY): Payer: Self-pay | Admitting: *Deleted

## 2012-11-17 ENCOUNTER — Encounter (HOSPITAL_COMMUNITY)
Admission: RE | Disposition: A | Discharge status: Home / Self Care | Payer: Self-pay | Source: Ambulatory Visit | Attending: Gastroenterology

## 2012-11-17 ENCOUNTER — Ambulatory Visit (HOSPITAL_COMMUNITY)
Admission: RE | Admit: 2012-11-17 | Discharge: 2012-11-17 | Disposition: A | Discharge status: Home / Self Care | Payer: Medicare HMO | Source: Ambulatory Visit | Attending: Gastroenterology | Admitting: Gastroenterology

## 2012-11-17 DIAGNOSIS — K648 Other hemorrhoids: Secondary | ICD-10-CM | POA: Insufficient documentation

## 2012-11-17 DIAGNOSIS — Z21 Asymptomatic human immunodeficiency virus [HIV] infection status: Secondary | ICD-10-CM | POA: Insufficient documentation

## 2012-11-17 DIAGNOSIS — J4489 Other specified chronic obstructive pulmonary disease: Secondary | ICD-10-CM | POA: Insufficient documentation

## 2012-11-17 DIAGNOSIS — C61 Malignant neoplasm of prostate: Secondary | ICD-10-CM | POA: Insufficient documentation

## 2012-11-17 DIAGNOSIS — K921 Melena: Secondary | ICD-10-CM | POA: Insufficient documentation

## 2012-11-17 DIAGNOSIS — K625 Hemorrhage of anus and rectum: Secondary | ICD-10-CM | POA: Insufficient documentation

## 2012-11-17 DIAGNOSIS — J449 Chronic obstructive pulmonary disease, unspecified: Secondary | ICD-10-CM | POA: Insufficient documentation

## 2012-11-17 DIAGNOSIS — K6289 Other specified diseases of anus and rectum: Secondary | ICD-10-CM | POA: Insufficient documentation

## 2012-11-17 DIAGNOSIS — Y842 Radiological procedure and radiotherapy as the cause of abnormal reaction of the patient, or of later complication, without mention of misadventure at the time of the procedure: Secondary | ICD-10-CM | POA: Insufficient documentation

## 2012-11-17 DIAGNOSIS — B1920 Unspecified viral hepatitis C without hepatic coma: Secondary | ICD-10-CM | POA: Insufficient documentation

## 2012-11-17 DIAGNOSIS — K627 Radiation proctitis: Secondary | ICD-10-CM

## 2012-11-17 HISTORY — DX: Gastro-esophageal reflux disease without esophagitis: K21.9

## 2012-11-17 HISTORY — PX: COLONOSCOPY: SHX5424

## 2012-11-17 SURGERY — COLONOSCOPY
Anesthesia: Moderate Sedation

## 2012-11-17 MED ORDER — MIDAZOLAM HCL 10 MG/2ML IJ SOLN
INTRAMUSCULAR | Status: AC
  Filled 2012-11-17: qty 2

## 2012-11-17 MED ORDER — DIPHENHYDRAMINE HCL 50 MG/ML IJ SOLN
INTRAMUSCULAR | Status: DC | PRN
  Administered 2012-11-17 (×2): 12.5 mg via INTRAVENOUS

## 2012-11-17 MED ORDER — DIPHENHYDRAMINE HCL 50 MG/ML IJ SOLN
INTRAMUSCULAR | Status: AC
  Filled 2012-11-17: qty 1

## 2012-11-17 MED ORDER — FENTANYL CITRATE 0.05 MG/ML IJ SOLN
INTRAMUSCULAR | Status: AC
  Filled 2012-11-17: qty 2

## 2012-11-17 MED ORDER — MIDAZOLAM HCL 5 MG/5ML IJ SOLN
INTRAMUSCULAR | Status: DC | PRN
  Administered 2012-11-17 (×3): 2 mg via INTRAVENOUS

## 2012-11-17 MED ORDER — SODIUM CHLORIDE 0.9 % IV SOLN
INTRAVENOUS | Status: DC
  Administered 2012-11-17: 500 mL via INTRAVENOUS

## 2012-11-17 MED ORDER — FENTANYL CITRATE 0.05 MG/ML IJ SOLN
INTRAMUSCULAR | Status: DC | PRN
  Administered 2012-11-17 (×4): 25 ug via INTRAVENOUS

## 2012-11-17 NOTE — Op Note (Signed)
[facility] [address] [place], [address]   COLONOSCOPY PROCEDURE REPORT  PATIENT: Billy Lopez, Billy Lopez  MR#: [id number] BIRTHDATE: November 07, 1940 , 72  yrs. old GENDER: Male ENDOSCOPIST: Meryl Dare, MD, [facility] REFERRED UV:OZDGU Min, M.D. PROCEDURE DATE:  11/17/2012 PROCEDURE:   Colonoscopy with control of bleeding ASA CLASS:   Class III INDICATIONS:hematochezia. MEDICATIONS: These medications were titrated to patient response per physician's verbal order, Fentanyl 100 mcg IV, Versed 6 mg IV, and Diphenhydramine (Benadryl) 25 mg IV DESCRIPTION OF PROCEDURE:   After the risks benefits and alternatives of the procedure were thoroughly explained, informed consent was obtained.  A digital rectal exam revealed no abnormalities of the rectum.   The Pentax Colonoscope [id number] endoscope was introduced through the anus and advanced to the cecum, which was identified by both the appendix and ileocecal valve. No adverse events experienced.   The quality of the prep was good, using MoviPrep  The instrument was then slowly withdrawn as the colon was fully examined.  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  It was moderate in severity and friable. Destruction via ablation was attempted. There was no blood loss from maneuver. Argon plasma coagulation was used.  Care was given to ensure that the lumen was suctioned well. The colon was otherwise normal. There was no diverticulosis, inflammation, polyps or cancers unless previously stated. Retroflexed views revealed small internal hemorrhoids. The time to cecum=3 minutes 30 seconds. Withdrawal time=12 minutes 30 seconds. The scope was withdrawn and the procedure completed.  COMPLICATIONS: There were no complications.  ENDOSCOPIC IMPRESSION: 1.   Radiation proctitis; APC ablation 2.   Internal hemorrhoids  RECOMMENDATIONS: 1.  Hold aspirin, aspirin products, and anti-inflammatory medication for 2 weeks. 2.   Given your age, you will not need another colonoscopy for colon cancer screening or polyp surveillance.  These types of tests usually stop around the age 19.  eSigned:  Meryl Dare, MD, [facility] 11/17/2012 9:23 AM

## 2012-11-17 NOTE — H&P (View-Only) (Signed)
History of Present Illness: This is a 72 year old male who has completed 40 sessions of radiation therapy for prostate cancer in mid 2013. He notes small amounts of bright red blood per rectum with all bowel movements. The symptoms began shortly following his radiation treatments. His symptoms haven't changed. He has HIV and HCV. He has not previously had a colonoscopy. Denies weight loss, abdominal pain, constipation, diarrhea, change in stool caliber, melena, nausea, vomiting, dysphagia, reflux symptoms, chest pain.  Review of Systems: Pertinent positive and negative review of systems were noted in the above HPI section. All other review of systems were otherwise negative.  Current Medications, Allergies, Past Medical History, Past Surgical History, Family History and Social History were reviewed in [employer] record.  Physical Exam: General: Well developed , well nourished, no acute distress Head: Normocephalic and atraumatic Eyes:  sclerae anicteric, EOMI Ears: Normal auditory acuity Mouth: No deformity or lesions Neck: Supple, no masses or thyromegaly Lungs: Clear throughout to auscultation Heart: Regular rate and rhythm; no murmurs, rubs or bruits Abdomen: Soft, non tender and non distended. No masses, hepatosplenomegaly or hernias noted. Normal Bowel sounds Rectal: No external or internal lesions, no tenderness, 3+ Hemoccult positive dark brown stool Musculoskeletal: Symmetrical with no gross deformities  Skin: No lesions on visible extremities Pulses:  Normal pulses noted Extremities: No clubbing, cyanosis, edema or deformities noted Neurological: Alert oriented x 4, grossly nonfocal Cervical Nodes:  No significant cervical adenopathy Inguinal Nodes: No significant inguinal adenopathy Psychological:  Alert and cooperative. Normal mood and affect  Assessment and Recommendations:  1. Hematochezia post prostate radiation therapy. Hemoccult positive stool. Rule  out radiation proctitis, colorectal neoplasms and other disorders The risks, benefits, and alternatives to colonoscopy with possible biopsy, possible APC therapy and possible polypectomy were discussed with the patient and they consent to proceed.   2. HIV disease managed by ID.  3. HCV. He states he has an appointment in early July with a hepatologist.  4. COPD.

## 2012-11-17 NOTE — Interval H&P Note (Signed)
History and Physical Interval Note:  11/17/2012 8:43 AM  Driscilla Grammes.  has presented today for surgery, with the diagnosis of .  The various methods of treatment have been discussed with the patient and family. After consideration of risks, benefits and other options for treatment, the patient has consented to  Procedure(s) with comments: COLONOSCOPY (N/A) - possible APC as a surgical intervention .  The patient's history has been reviewed, patient examined, no change in status, stable for surgery.  I have reviewed the patient's chart and labs.  Questions were answered to the patient's satisfaction.     Venita Lick. Russella Dar MD

## 2012-11-18 ENCOUNTER — Encounter (HOSPITAL_COMMUNITY): Payer: Self-pay | Admitting: Gastroenterology

## 2012-11-27 ENCOUNTER — Telehealth: Payer: Self-pay | Admitting: Gastroenterology

## 2012-11-27 NOTE — Telephone Encounter (Signed)
Patient had a BM on 11/20/12 but then not since.  The procedure was on 11/17/12.  He has no other GI complaints.  He is asked to start on Miralax 1-3 times a day until he has a BM then take as needed.  He verbalized understanding.  He will call back for any additional questions or concerns

## 2012-12-02 ENCOUNTER — Ambulatory Visit
Admission: RE | Admit: 2012-12-02 | Discharge: 2012-12-02 | Disposition: A | Discharge status: Home / Self Care | Payer: Medicare HMO | Source: Ambulatory Visit | Attending: Internal Medicine | Admitting: Internal Medicine

## 2012-12-02 ENCOUNTER — Other Ambulatory Visit: Payer: Self-pay | Admitting: Internal Medicine

## 2012-12-02 DIAGNOSIS — C220 Liver cell carcinoma: Secondary | ICD-10-CM

## 2012-12-04 ENCOUNTER — Other Ambulatory Visit: Payer: Self-pay | Admitting: Internal Medicine

## 2012-12-07 ENCOUNTER — Other Ambulatory Visit: Payer: Self-pay | Admitting: Internal Medicine

## 2012-12-07 NOTE — Telephone Encounter (Signed)
Done hardcopy to robin  

## 2012-12-07 NOTE — Telephone Encounter (Signed)
Faxed hardcopy to pharmacy. 

## 2012-12-20 ENCOUNTER — Other Ambulatory Visit: Payer: Self-pay | Admitting: Internal Medicine

## 2012-12-21 NOTE — Telephone Encounter (Signed)
Done erx 

## 2013-01-11 ENCOUNTER — Encounter: Payer: Self-pay | Admitting: Gastroenterology

## 2013-01-11 ENCOUNTER — Ambulatory Visit (INDEPENDENT_AMBULATORY_CARE_PROVIDER_SITE_OTHER): Payer: Medicare HMO | Admitting: Gastroenterology

## 2013-01-11 VITALS — BP 100/60 | HR 68 | Ht 68.0 in | Wt 160.5 lb

## 2013-01-11 DIAGNOSIS — K627 Radiation proctitis: Secondary | ICD-10-CM

## 2013-01-11 DIAGNOSIS — R1319 Other dysphagia: Secondary | ICD-10-CM

## 2013-01-11 DIAGNOSIS — K921 Melena: Secondary | ICD-10-CM

## 2013-01-11 DIAGNOSIS — K6289 Other specified diseases of anus and rectum: Secondary | ICD-10-CM

## 2013-01-11 NOTE — Patient Instructions (Addendum)
You have been scheduled for a Barium Esophogram with tablet at [facility] (1st floor of the hospital) on 01/13/13 at 9:30am. Please arrive 15 minutes prior to your appointment for registration. Make certain not to have anything to eat or drink 6 hours prior to your test. If you need to reschedule for any reason, please contact radiology at [phone] to do so. __________________________________________________________________ A barium swallow is an examination that concentrates on views of the esophagus. This tends to be a double contrast exam (barium and two liquids which, when combined, create a gas to distend the wall of the oesophagus) or single contrast (non-ionic iodine based). The study is usually tailored to your symptoms so a good history is essential. Attention is paid during the study to the form, structure and configuration of the esophagus, looking for functional disorders (such as aspiration, dysphagia, achalasia, motility and reflux) EXAMINATION You may be asked to change into a gown, depending on the type of swallow being performed. A radiologist and radiographer will perform the procedure. The radiologist will advise you of the type of contrast selected for your procedure and direct you during the exam. You will be asked to stand, sit or lie in several different positions and to hold a small amount of fluid in your mouth before being asked to swallow while the imaging is performed .In some instances you may be asked to swallow barium coated marshmallows to assess the motility of a solid food bolus. The exam can be recorded as a digital or video fluoroscopy procedure. POST PROCEDURE It will take 1-2 days for the barium to pass through your system. To facilitate this, it is important, unless otherwise directed, to increase your fluids for the next 24-48hrs and to resume your normal diet.  This test typically takes about 30 minutes to  perform. __________________________________________________________________________________  Thank you for choosing me and [facility].  Venita Lick. Pleas Koch., MD., [facility]  cc. Marcine Matar, MD

## 2013-01-11 NOTE — Progress Notes (Signed)
History of Present Illness: This is a 72 year old male who returns for followup after treatment of radiation proctitis in June 2014. He had an excellent response to APC therapy and now only notes tiny flecks of blood in his stool about once per week. This is a substantial improvement. He noted one episode of dysphagia while swallowing watermelon but he generally does not have dysphagia or reflux symptoms.   Current Medications, Allergies, Past Medical History, Past Surgical History, Family History and Social History were reviewed in [employer] record.  Physical Exam: General: Well developed , well nourished, no acute distress Head: Normocephalic and atraumatic Eyes:  sclerae anicteric, EOMI Ears: Normal auditory acuity Mouth: No deformity or lesions Lungs: Clear throughout to auscultation Heart: Regular rate and rhythm; no murmurs, rubs or bruits Abdomen: Soft, non tender and non distended. No masses, hepatosplenomegaly or hernias noted. Normal Bowel sounds Musculoskeletal: Symmetrical with no gross deformities  Pulses:  Normal pulses noted Extremities: No clubbing, cyanosis, edema or deformities noted Neurological: Alert oriented x 4, grossly nonfocal Psychological:  Alert and cooperative. Normal mood and affect  Assessment and Recommendations:  1. Radiation proctitis. Excellent response to APC therapy. We discussed the natural history of radiation proctitis and the general response to APC therapy is a substantial improvement in rectal bleeding. Typically it is not 100% effective in eliminating all rectal bleeding and frequent small amounts of bright red blood is not unusual. If the frequency or severity of bleeding increases significantly we will consider repeat APC therapy.  2. Dysphagia, one episode while swallowing watermelon. Obtain a barium esophagram to further evaluate.

## 2013-01-13 ENCOUNTER — Ambulatory Visit (HOSPITAL_COMMUNITY)
Admission: RE | Admit: 2013-01-13 | Discharge: 2013-01-13 | Disposition: A | Discharge status: Home / Self Care | Payer: Medicare HMO | Source: Ambulatory Visit | Attending: Gastroenterology | Admitting: Gastroenterology

## 2013-01-13 DIAGNOSIS — R1319 Other dysphagia: Secondary | ICD-10-CM

## 2013-01-13 DIAGNOSIS — K224 Dyskinesia of esophagus: Secondary | ICD-10-CM | POA: Insufficient documentation

## 2013-02-02 ENCOUNTER — Other Ambulatory Visit: Payer: Medicare HMO

## 2013-02-02 ENCOUNTER — Other Ambulatory Visit (HOSPITAL_COMMUNITY)
Admission: RE | Admit: 2013-02-02 | Discharge: 2013-02-02 | Disposition: A | Discharge status: Home / Self Care | Payer: Medicare HMO | Source: Ambulatory Visit | Attending: Internal Medicine | Admitting: Internal Medicine

## 2013-02-02 DIAGNOSIS — Z113 Encounter for screening for infections with a predominantly sexual mode of transmission: Secondary | ICD-10-CM | POA: Insufficient documentation

## 2013-02-02 DIAGNOSIS — B20 Human immunodeficiency virus [HIV] disease: Secondary | ICD-10-CM

## 2013-02-02 LAB — RPR

## 2013-02-02 LAB — CBC WITH DIFFERENTIAL/PLATELET
Basophils Absolute: 0.1 10*3/uL (ref 0.0–0.1)
Basophils Relative: 2 % — ABNORMAL HIGH (ref 0–1)
Eosinophils Absolute: 0.1 10*3/uL (ref 0.0–0.7)
Lymphs Abs: 0.7 10*3/uL (ref 0.7–4.0)
MCH: 30.2 pg (ref 26.0–34.0)
MCHC: 33.8 g/dL (ref 30.0–36.0)
Neutrophils Relative %: 51 % (ref 43–77)
Platelets: 214 10*3/uL (ref 150–400)
RBC: 4.17 MIL/uL — ABNORMAL LOW (ref 4.22–5.81)
RDW: 13.5 % (ref 11.5–15.5)

## 2013-02-02 LAB — COMPLETE METABOLIC PANEL WITH GFR
ALT: 55 U/L — ABNORMAL HIGH (ref 0–53)
Albumin: 4 g/dL (ref 3.5–5.2)
BUN: 25 mg/dL — ABNORMAL HIGH (ref 6–23)
CO2: 24 mEq/L (ref 19–32)
Calcium: 9 mg/dL (ref 8.4–10.5)
Chloride: 105 mEq/L (ref 96–112)
Creat: 1.41 mg/dL — ABNORMAL HIGH (ref 0.50–1.35)
GFR, Est African American: 58 mL/min — ABNORMAL LOW

## 2013-02-04 LAB — HIV-1 RNA QUANT-NO REFLEX-BLD: HIV-1 RNA Quant, Log: <1.3 {Log} (ref <1.30)

## 2013-02-16 ENCOUNTER — Encounter: Payer: Self-pay | Admitting: Internal Medicine

## 2013-02-16 ENCOUNTER — Ambulatory Visit (INDEPENDENT_AMBULATORY_CARE_PROVIDER_SITE_OTHER): Payer: Medicare HMO | Admitting: Internal Medicine

## 2013-02-16 VITALS — BP 121/79 | HR 70 | Temp 97.9°F | Ht 68.0 in | Wt 166.0 lb

## 2013-02-16 DIAGNOSIS — G8929 Other chronic pain: Secondary | ICD-10-CM

## 2013-02-16 DIAGNOSIS — B20 Human immunodeficiency virus [HIV] disease: Secondary | ICD-10-CM

## 2013-02-16 DIAGNOSIS — Z23 Encounter for immunization: Secondary | ICD-10-CM

## 2013-02-16 DIAGNOSIS — N189 Chronic kidney disease, unspecified: Secondary | ICD-10-CM

## 2013-02-16 NOTE — Assessment & Plan Note (Signed)
This is stable with his current regimen

## 2013-02-16 NOTE — Assessment & Plan Note (Signed)
He is doing well with his current regimen and will return in 4 months

## 2013-02-16 NOTE — Assessment & Plan Note (Signed)
He again has a small elevation in his creatinine. I will recheck at his next visit to assure not persistent

## 2013-02-16 NOTE — Progress Notes (Signed)
  Subjective:    Patient ID: Billy Grammes., male    DOB: 12/20/1940, 72 y.o.   MRN: [id number]  HPI For routine followup.  He continues on Atripla and denies any missed doses. His CD4 count is 180, which is essentially where he stays in his viral load remains undetectable. Unfortunately, he has not been able to get his viral load up. 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 He has no weight loss. He does continue to be active and goes to the [facility]. He has no complaints. His pain is controlled with his current pain medicine regimen   Review of Systems  Constitutional: Negative for fever, fatigue and unexpected weight change.  HENT: Negative for sore throat and trouble swallowing.   Eyes: Negative for visual disturbance.  Respiratory: Negative for shortness of breath.   Cardiovascular: Negative for chest pain.  Gastrointestinal: Negative for nausea, abdominal pain and diarrhea.  Musculoskeletal: Negative for myalgias and arthralgias.  Skin: Negative for rash.  Neurological: Negative for headaches.  Hematological: Negative for adenopathy.  Psychiatric/Behavioral: The patient is not nervous/anxious.        Objective:   Physical Exam  Constitutional: He is oriented to person, place, and time. He appears well-developed and well-nourished. No distress.  HENT:  Mouth/Throat: No oropharyngeal exudate.  Eyes: No scleral icterus.  Cardiovascular: Normal rate, regular rhythm and normal heart sounds.   No murmur heard. Pulmonary/Chest: Effort normal and breath sounds normal. No respiratory distress.  Lymphadenopathy:    He has no cervical adenopathy.  Neurological: He is alert and oriented to person, place, and time.  Skin: No rash noted.  Psychiatric: He has a normal mood and affect. His behavior is normal.          Assessment & Plan:

## 2013-03-02 ENCOUNTER — Other Ambulatory Visit: Payer: Medicaid Other

## 2013-03-03 ENCOUNTER — Other Ambulatory Visit: Payer: Self-pay | Admitting: Internal Medicine

## 2013-03-03 NOTE — Telephone Encounter (Signed)
Done hardcopy to robin  

## 2013-03-03 NOTE — Telephone Encounter (Signed)
Faxed hardcopy to [facility] 

## 2013-03-24 ENCOUNTER — Other Ambulatory Visit: Payer: Self-pay | Admitting: Internal Medicine

## 2013-03-25 NOTE — Telephone Encounter (Signed)
Faxed hardcopy to [facility] 

## 2013-03-25 NOTE — Telephone Encounter (Signed)
Done hardcopy to robin  

## 2013-04-05 ENCOUNTER — Telehealth: Payer: Self-pay | Admitting: Internal Medicine

## 2013-04-05 ENCOUNTER — Telehealth: Payer: Self-pay | Admitting: *Deleted

## 2013-04-05 MED ORDER — HYDROCODONE-ACETAMINOPHEN 5-325 MG PO TABS: ORAL_TABLET | ORAL | Status: DC

## 2013-04-05 NOTE — Telephone Encounter (Signed)
Done hardcopy to robin, to also let pt know:  You are given the letter today explaining the transitional pain medication refill policy due to recent change in [place] Law  Please be aware that I will no longer be able to offer monthly refills of any Schedule II or higher medication starting Jul 04, 2013  

## 2013-04-05 NOTE — Telephone Encounter (Signed)
Pt is requesting a refill on hydrocodone to pick up.

## 2013-04-05 NOTE — Telephone Encounter (Signed)
Pt called requesting Hydrocodone refill.  Please advise 

## 2013-04-06 NOTE — Telephone Encounter (Signed)
Called left message to all back. 

## 2013-04-06 NOTE — Telephone Encounter (Signed)
Called left message to call back 

## 2013-04-06 NOTE — Telephone Encounter (Signed)
Patient informed to pickup hardcopy's at the front desk and informed of letter attached regarding MD's change in pain medication refill policy.

## 2013-04-12 ENCOUNTER — Telehealth: Payer: Self-pay | Admitting: *Deleted

## 2013-04-12 MED ORDER — ENSURE COMPLETE PO LIQD: 237.0000 mL | Freq: Two times a day (BID) | ORAL | Status: DC

## 2013-04-12 NOTE — Telephone Encounter (Signed)
Ok for all  Ensure done erx

## 2013-04-12 NOTE — Telephone Encounter (Signed)
Billy Lopez called requesting the Dx list, Medication list and last OV notes on pt.  She also requests order for Ensure which pt drinks 2 cans daily.  Requests it to be faxed to [phone]

## 2013-04-13 NOTE — Telephone Encounter (Signed)
Faxed requested information to [phone]

## 2013-04-16 ENCOUNTER — Encounter: Payer: Self-pay | Admitting: Internal Medicine

## 2013-04-16 ENCOUNTER — Ambulatory Visit (INDEPENDENT_AMBULATORY_CARE_PROVIDER_SITE_OTHER): Payer: Medicare HMO | Admitting: Internal Medicine

## 2013-04-16 VITALS — BP 132/86 | HR 89 | Temp 98.0°F | Ht 68.0 in | Wt 167.5 lb

## 2013-04-16 DIAGNOSIS — Z0000 Encounter for general adult medical examination without abnormal findings: Secondary | ICD-10-CM

## 2013-04-16 DIAGNOSIS — G894 Chronic pain syndrome: Secondary | ICD-10-CM

## 2013-04-16 DIAGNOSIS — Z23 Encounter for immunization: Secondary | ICD-10-CM

## 2013-04-16 DIAGNOSIS — G8929 Other chronic pain: Secondary | ICD-10-CM

## 2013-04-16 MED ORDER — SILDENAFIL CITRATE 100 MG PO TABS: 50.0000 mg | ORAL_TABLET | Freq: Every day | ORAL | Status: DC | PRN

## 2013-04-16 NOTE — Patient Instructions (Addendum)
You had the new Prevnar Pneumonia shot today Please take all new medication as prescribed - the viagra You are given the prescription for the Ensure You will be contacted regarding the referral for: pain clinic Please continue all other medications as before, and refills have been done if requested. Please have the pharmacy call with any other refills you may need. Please continue your efforts at being more active, low cholesterol diet, and weight control. You are otherwise up to date with prevention measures today. Please keep your appointments with your specialists as you have planned  Please remember to sign up for My Chart if you have not done so, as this will be important to you in the future with finding out test results, communicating by private email, and scheduling acute appointments online when needed.  Please return in 6 months, or sooner if needed

## 2013-04-16 NOTE — Progress Notes (Signed)
Subjective:    Patient ID: Billy Lopez., male    DOB: [birth year], 72 y.o.   MRN: [id number]  HPI  Here for wellness and f/u;  Overall doing ok;  Pt denies CP, worsening SOB, DOE, wheezing, orthopnea, PND, worsening LE edema, palpitations, dizziness or syncope.  Pt denies neurological change such as new headache, facial or extremity weakness.  Pt denies polydipsia, polyuria, or low sugar symptoms. Pt states overall good compliance with treatment and medications, good tolerability, and has been trying to follow lower cholesterol diet.  Pt denies worsening depressive symptoms, suicidal ideation or panic. No fever, night sweats, wt loss, loss of appetite, or other constitutional symptoms.  Pt states good ability with ADL's, has low fall risk, home safety reviewed and adequate, no other significant changes in hearing or vision, and only occasionally active with exercise Has appt mon with Dr Dahlstedt/urology. Asks for viagra refill, pain clinic referral, due for prevnar, asks for ensure rx bid Past Medical History  Diagnosis Date  . Hypertension   . HIV infection   . Substance abuse   . 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 Allergic rhinitis, cause unspecified 07/21/2011  . Cervical spondylosis 07/21/2011  . Lumbar spondylosis 07/21/2011  . Osteoarthritis 07/21/2011  . COPD (chronic obstructive pulmonary disease) 07/21/2011  . 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 Hx of radiation therapy 12/17/11 - 02/13/12    prostate  . Prostate cancer 09/19/11    gleason 7  . H/O blood clots   . Pneumonia   . GERD (gastroesophageal reflux disease)   . Hepatitis     Hep C  . Rectal bleeding    Past Surgical History  Procedure Laterality Date  . Hip surgery      total bilateral replacement  . Hernia repair    . Hand surgery      right thumb  . Foot surgery      bilat. ingrown toenails removed  . Colonoscopy N/A 11/17/2012    Procedure: COLONOSCOPY;  Surgeon: Meryl Dare, MD;  Location: [facility];  Service:  Endoscopy;  Laterality: N/A;  possible APC    reports that he quit smoking about 52 years ago. His smoking use included Cigarettes. He has a 10 pack-year smoking history. He has never used smokeless tobacco. He reports that he uses illicit drugs (Heroin) about 7 times per week. He reports that he does not drink alcohol. family history includes Alcohol abuse in his other; Arthritis in his other; Cancer in his mother; Hypertension in his other. There is no history of Colon cancer or Diabetes. No Known Allergies Current Outpatient Prescriptions on File Prior to Visit  Medication Sig Dispense Refill  . cholecalciferol (VITAMIN D) 1000 UNITS tablet Take 1,000 Units by mouth every evening.      . diphenoxylate-atropine (LOMOTIL) 2.5-0.025 MG per tablet Take 1 tablet by mouth 4 (four) times daily as needed for diarrhea or loose stools.  30 tablet  0  . efavirenz-emtricitabine-tenofovir (ATRIPLA) 600-200-300 MG per tablet Take 1 tablet by mouth at bedtime.  30 tablet  11  . enalapril (VASOTEC) 20 MG tablet Take 1 tablet (20 mg total) by mouth every morning.  90 tablet  3  . feeding supplement, ENSURE COMPLETE, (ENSURE COMPLETE) LIQD Take 237 mLs by mouth 2 (two) times daily between meals.  474 mL  11  . hydrochlorothiazide (HYDRODIURIL) 25 MG tablet Take 1 tablet (25 mg total) by mouth daily.  90 tablet  3  . HYDROcodone-acetaminophen (NORCO/VICODIN) 5-325  MG per tablet take 1-2 tablets by mouth every 6 hours if needed - to fill Jun 04, 2013  100 tablet  0  . loratadine (CLARITIN) 10 MG tablet Take 10 mg by mouth daily as needed. For allergies.      . naproxen (NAPROSYN) 500 MG tablet Take 1 tablet (500 mg total) by mouth 2 (two) times daily.  60 tablet  5  . ondansetron (ZOFRAN ODT) 8 MG disintegrating tablet Take 1 tablet (8 mg total) by mouth every 8 (eight) hours as needed for nausea.  20 tablet  0  . pantoprazole (PROTONIX) 40 MG tablet Take 40 mg by mouth daily as needed. For indigestion.      .  tamsulosin (FLOMAX) 0.4 MG CAPS take 1 capsule by mouth twice a day  60 capsule  0  . traMADol (ULTRAM) 50 MG tablet Take 50 mg by mouth every 6 (six) hours as needed. pain      . traMADol (ULTRAM) 50 MG tablet take 1 tablet by mouth every 6 hours if needed  120 tablet  2  . vitamin A 16109 UNIT capsule Take 10,000 Units by mouth every evening.      . vitamin C (ASCORBIC ACID) 500 MG tablet Take 500 mg by mouth every evening.       No current facility-administered medications on file prior to visit.   . Review of Systems Constitutional: Negative for diaphoresis, activity change, appetite change or unexpected weight change.  HENT: Negative for hearing loss, ear pain, facial swelling, mouth sores and neck stiffness.   Eyes: Negative for pain, redness and visual disturbance.  Respiratory: Negative for shortness of breath and wheezing.   Cardiovascular: Negative for chest pain and palpitations.  Gastrointestinal: Negative for diarrhea, blood in stool, abdominal distention or other pain Genitourinary: Negative for hematuria, flank pain or change in urine volume.  Musculoskeletal: Negative for myalgias and joint swelling.  Skin: Negative for color change and wound.  Neurological: Negative for syncope and numbness. other than noted Hematological: Negative for adenopathy.  Psychiatric/Behavioral: Negative for hallucinations, self-injury, decreased concentration and agitation.      Objective:   Physical Exam BP 132/86  Pulse 89  Temp(Src) 98 F (36.7 C) (Oral)  Ht 5\' 8"  (1.727 m)  Wt 167 lb 8 oz (75.978 kg)  BMI 25.47 kg/m2  SpO2 93% VS noted,  Constitutional: Pt is oriented to person, place, and time. Appears well-developed and well-nourished.  Head: Normocephalic and atraumatic.  Right Ear: External ear normal.  Left Ear: External ear normal.  Nose: Nose normal.  Mouth/Throat: Oropharynx is clear and moist.  Eyes: Conjunctivae and EOM are normal. Pupils are equal, round, and  reactive to light.  Neck: Normal range of motion. Neck supple. No JVD present. No tracheal deviation present.  Cardiovascular: Normal rate, regular rhythm, normal heart sounds and intact distal pulses.   Pulmonary/Chest: Effort normal and breath sounds normal.  Abdominal: Soft. Bowel sounds are normal. There is no tenderness. No HSM  Musculoskeletal: Normal range of motion. Exhibits no edema.  Lymphadenopathy:  Has no cervical adenopathy.  Neurological: Pt is alert and oriented to person, place, and time. Pt has normal reflexes. No cranial nerve deficit.  Skin: Skin is warm and dry. No rash noted.  Psychiatric:  Has  normal mood and affect. Behavior is normal.     Assessment & Plan:

## 2013-04-16 NOTE — Progress Notes (Signed)
Pre-visit discussion using our clinic review tool. No additional management support is needed unless otherwise documented below in the visit note.  

## 2013-04-18 NOTE — Assessment & Plan Note (Signed)
For pain clinic referral 

## 2013-04-18 NOTE — Assessment & Plan Note (Signed)

## 2013-05-11 ENCOUNTER — Telehealth: Payer: Self-pay | Admitting: Internal Medicine

## 2013-05-11 NOTE — Telephone Encounter (Signed)
Noted, and will no longer be an issue soon for this practice, as I will no longer be providing chronic pain management for all pts after Jul 04, 2013

## 2013-05-11 NOTE — Telephone Encounter (Signed)
Caller states Billy Lopez is selling all of the Hydrocodone that you prescribe to him.

## 2013-05-24 ENCOUNTER — Other Ambulatory Visit: Payer: Self-pay | Admitting: *Deleted

## 2013-05-24 DIAGNOSIS — M545 Low back pain: Secondary | ICD-10-CM

## 2013-05-24 MED ORDER — NAPROXEN 500 MG PO TABS: 500.0000 mg | ORAL_TABLET | Freq: Two times a day (BID) | ORAL | Status: DC

## 2013-06-07 ENCOUNTER — Other Ambulatory Visit (INDEPENDENT_AMBULATORY_CARE_PROVIDER_SITE_OTHER): Payer: Medicare HMO

## 2013-06-07 ENCOUNTER — Encounter (INDEPENDENT_AMBULATORY_CARE_PROVIDER_SITE_OTHER): Payer: Self-pay

## 2013-06-07 DIAGNOSIS — B20 Human immunodeficiency virus [HIV] disease: Secondary | ICD-10-CM

## 2013-06-07 LAB — BASIC METABOLIC PANEL WITH GFR
BUN: 22 mg/dL (ref 6–23)
CALCIUM: 8.3 mg/dL — AB (ref 8.4–10.5)
CO2: 23 mEq/L (ref 19–32)
Chloride: 105 mEq/L (ref 96–112)
Creat: 1.09 mg/dL (ref 0.50–1.35)
GFR, EST AFRICAN AMERICAN: 79 mL/min
GFR, Est Non African American: 68 mL/min
Glucose, Bld: 94 mg/dL (ref 70–99)
Potassium: 3.3 mEq/L — ABNORMAL LOW (ref 3.5–5.3)
SODIUM: 133 meq/L — AB (ref 135–145)

## 2013-06-08 LAB — T-HELPER CELL (CD4) - (RCID CLINIC ONLY)
CD4 T CELL ABS: 300 /uL — AB (ref 400–2700)
CD4 T CELL HELPER: 25 % — AB (ref 33–55)

## 2013-06-09 LAB — HIV-1 RNA QUANT-NO REFLEX-BLD

## 2013-06-10 ENCOUNTER — Telehealth: Payer: Self-pay | Admitting: *Deleted

## 2013-06-10 MED ORDER — HYDROCODONE-ACETAMINOPHEN 5-325 MG PO TABS: ORAL_TABLET | ORAL | Status: DC

## 2013-06-10 NOTE — Telephone Encounter (Signed)
Patient phoned in stating he has lost one of his prescriptions for his hydrocodone.  Requesting replacement script.  Please advise.  CB# [phone]

## 2013-06-10 NOTE — Telephone Encounter (Signed)
Done hardcopy to robin  Please be aware that I will no longer be able to refill his hydrocodone, pt was notified of the policy in a nov 2014 letter

## 2013-06-11 NOTE — Telephone Encounter (Signed)
Notified patient that script was available for pick up 

## 2013-06-17 ENCOUNTER — Encounter: Payer: Self-pay | Admitting: Internal Medicine

## 2013-06-17 ENCOUNTER — Ambulatory Visit (INDEPENDENT_AMBULATORY_CARE_PROVIDER_SITE_OTHER): Payer: Medicare HMO | Admitting: Internal Medicine

## 2013-06-17 VITALS — BP 118/82 | HR 66 | Temp 98.4°F | Ht 68.0 in | Wt 167.0 lb

## 2013-06-17 DIAGNOSIS — M1990 Unspecified osteoarthritis, unspecified site: Secondary | ICD-10-CM | POA: Insufficient documentation

## 2013-06-17 DIAGNOSIS — B1710 Acute hepatitis C without hepatic coma: Secondary | ICD-10-CM

## 2013-06-17 DIAGNOSIS — B20 Human immunodeficiency virus [HIV] disease: Secondary | ICD-10-CM

## 2013-06-17 DIAGNOSIS — M129 Arthropathy, unspecified: Secondary | ICD-10-CM

## 2013-06-17 DIAGNOSIS — Z113 Encounter for screening for infections with a predominantly sexual mode of transmission: Secondary | ICD-10-CM

## 2013-06-17 DIAGNOSIS — N189 Chronic kidney disease, unspecified: Secondary | ICD-10-CM

## 2013-06-17 NOTE — Assessment & Plan Note (Signed)
Creat in normal range now.

## 2013-06-17 NOTE — Progress Notes (Signed)
  Subjective:    Patient ID: Billy Brink., male    DOB: [birth year], 73 y.o.   MRN: [id number]  HPI  For routine followup.  He continues on Atripla and denies any missed doses. His CD4 count,  Which has been chronically low despite viral suppression, is now up to 300. 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 He has no weight loss. He does continue to be active and goes to the [facility]. He has no complaints. His pain is controlled currently with medication and is being sent to a pain clinic.  He has had problems with his right 2nd toe that is deformed.     Review of Systems  Constitutional: Negative for fever, fatigue and unexpected weight change.  HENT: Negative for sore throat and trouble swallowing.   Eyes: Negative for visual disturbance.  Respiratory: Negative for shortness of breath.   Cardiovascular: Negative for chest pain.  Gastrointestinal: Negative for nausea, abdominal pain and diarrhea.  Musculoskeletal: Negative for arthralgias and myalgias.  Skin: Negative for rash.  Neurological: Negative for headaches.  Hematological: Negative for adenopathy.  Psychiatric/Behavioral: The patient is not nervous/anxious.        Objective:   Physical Exam  Constitutional: He appears well-developed and well-nourished. No distress.  HENT:  Mouth/Throat: No oropharyngeal exudate.  Eyes: No scleral icterus.  Cardiovascular: Normal rate, regular rhythm and normal heart sounds.   No murmur heard. Pulmonary/Chest: Effort normal and breath sounds normal. No respiratory distress.  Musculoskeletal:  Right second toe contracted, lesion at top from rubbing  Lymphadenopathy:    He has no cervical adenopathy.  Skin: No rash noted.  Psychiatric: He has a normal mood and affect. His behavior is normal.          Assessment & Plan:

## 2013-06-17 NOTE — Assessment & Plan Note (Addendum)
He does have active virus. His APRI is 0.682 which is not suggestive of significant liver fibrosis. He does take a lot of medications.  I will consider evaluation for treatment with his next visit.  He would need evaluation by our substance abuse counselor.

## 2013-06-17 NOTE — Assessment & Plan Note (Signed)
Doing well, CD4 up to 300, hopefully will stay there.  RTC 4 months.    Lipid panel per PCP.

## 2013-06-17 NOTE — Assessment & Plan Note (Signed)
He seems to have a deformity of his right second toe. He likely needs at least orthopedic shoes to reduce the swelling and friction. I will have him referred to podiatry

## 2013-06-18 ENCOUNTER — Telehealth: Payer: Self-pay | Admitting: *Deleted

## 2013-06-18 NOTE — Telephone Encounter (Signed)
Attempted to refer patient to podiatry and because his secondary insurance is Medicaid [place] Access the referral needs to come from practice on the card which is [facility]. Patient notified Myrtis Hopping

## 2013-06-22 NOTE — Telephone Encounter (Signed)
Patient returned Billy Lopez's call.  Understands that he needs to have his Medicaid [place] Access card changed to have Dr. [name] listed as his PCP, then have Dr. Jenny Reichmann continue with the podiatry referral. Landis Gandy, RN

## 2013-06-27 ENCOUNTER — Other Ambulatory Visit: Payer: Self-pay | Admitting: Internal Medicine

## 2013-06-28 ENCOUNTER — Other Ambulatory Visit: Payer: Self-pay | Admitting: Internal Medicine

## 2013-06-28 ENCOUNTER — Other Ambulatory Visit: Payer: Self-pay | Admitting: *Deleted

## 2013-06-28 ENCOUNTER — Telehealth: Payer: Self-pay | Admitting: Internal Medicine

## 2013-06-28 NOTE — Telephone Encounter (Signed)
Pt also request refill for enalapril 20 mg to be send into [facility] and also the tramadol. Please advise

## 2013-06-28 NOTE — Telephone Encounter (Signed)
Pt takes the Tramadol one every 6 hours.  He has 2 pills left and will run out today.  He is requesting a refill today.

## 2013-06-28 NOTE — Telephone Encounter (Signed)
I did not see this message until now (6:40p) Therefore, I will defer refill on tramadol to Dr. Jenny Reichmann (PCP) during business day Tuesday 1/27

## 2013-06-28 NOTE — Telephone Encounter (Signed)
Per note in EPIC pt needs to contact Dr. [name] for further refills of Naprosyn.  Faxed this information to [employer].

## 2013-06-28 NOTE — Telephone Encounter (Signed)
Faxed script back to [facility]...lmb 

## 2013-06-28 NOTE — Telephone Encounter (Signed)
The patient called back and is requesting a refill of his tramadol. I explained to the patient that Dr.Jadd, nor Shirlean Mylar were in the office today, and he would be able to make this request tomorrow.   I offered him an ov, but he refused stating he wants to speak with the [facility] first.

## 2013-06-29 NOTE — Telephone Encounter (Signed)
The patient called again checking on the status of his pain med refill.  I informed the pt that as soon as it was approved, the CMA would notify him.  Thanks!

## 2013-06-29 NOTE — Telephone Encounter (Signed)
Called the patient and he states he has not received his refill.  Dr. Asa Lente did not received refill request until 6:40 pm on 06/28/13 and forwarded note back to PCP due to the late hour of receiving request.

## 2013-06-29 NOTE — Telephone Encounter (Signed)
We need to find the prescription since it was printed off per Dr Asa Lente, as documented on the chart

## 2013-06-29 NOTE — Telephone Encounter (Signed)
This has already been addressed by Dr Carollee Sires 26

## 2013-06-30 MED ORDER — TRAMADOL HCL 50 MG PO TABS: ORAL_TABLET | ORAL | Status: DC

## 2013-06-30 NOTE — Telephone Encounter (Signed)
Called the patient to inquire where to fax refill.  I was informed by the patient he received his medication yesterday Tramadol 50 mg #120 with 2 refills.  Previous prescription printed this morning by PCP is on his desk to discard.

## 2013-06-30 NOTE — Telephone Encounter (Signed)
I did not print or sign this rx - defer to Dr Jenny Reichmann Thanks and sorry for confusion

## 2013-06-30 NOTE — Telephone Encounter (Signed)
Done hardcopy to robin  

## 2013-07-09 ENCOUNTER — Ambulatory Visit: Payer: Self-pay

## 2013-07-13 ENCOUNTER — Telehealth: Payer: Self-pay | Admitting: Internal Medicine

## 2013-07-13 DIAGNOSIS — G894 Chronic pain syndrome: Secondary | ICD-10-CM

## 2013-07-13 NOTE — Telephone Encounter (Signed)
Patient informed of MD instructions.  He would like a referral to pain clinic.

## 2013-07-13 NOTE — Telephone Encounter (Signed)
Pt request refill for hydrocodone. Please call pt °

## 2013-07-13 NOTE — Telephone Encounter (Signed)
Very sorry, but pt was notified by letter in Nov 2014 that I would no longer be able to prescribed this after Jul 04, 2013

## 2013-07-13 NOTE — Telephone Encounter (Signed)
Done per emr 

## 2013-08-09 ENCOUNTER — Other Ambulatory Visit: Payer: Self-pay | Admitting: Nurse Practitioner

## 2013-08-09 DIAGNOSIS — C220 Liver cell carcinoma: Secondary | ICD-10-CM

## 2013-08-16 ENCOUNTER — Ambulatory Visit
Admission: RE | Admit: 2013-08-16 | Discharge: 2013-08-16 | Disposition: A | Discharge status: Home / Self Care | Payer: Commercial Managed Care - HMO | Source: Ambulatory Visit | Attending: Nurse Practitioner | Admitting: Nurse Practitioner

## 2013-08-16 DIAGNOSIS — C220 Liver cell carcinoma: Secondary | ICD-10-CM

## 2013-08-26 ENCOUNTER — Other Ambulatory Visit: Payer: Self-pay | Admitting: Internal Medicine

## 2013-09-21 ENCOUNTER — Other Ambulatory Visit: Payer: Self-pay

## 2013-09-21 DIAGNOSIS — I10 Essential (primary) hypertension: Secondary | ICD-10-CM

## 2013-09-21 MED ORDER — ENALAPRIL MALEATE 20 MG PO TABS: 20.0000 mg | ORAL_TABLET | Freq: Every morning | ORAL | Status: DC

## 2013-09-27 ENCOUNTER — Other Ambulatory Visit: Payer: Self-pay | Admitting: Internal Medicine

## 2013-09-28 NOTE — Telephone Encounter (Signed)
Done hardcopy to robin  

## 2013-09-28 NOTE — Telephone Encounter (Signed)
Faxed hardcopy to [facility]

## 2013-10-05 ENCOUNTER — Other Ambulatory Visit: Payer: Commercial Managed Care - HMO

## 2013-10-05 DIAGNOSIS — Z113 Encounter for screening for infections with a predominantly sexual mode of transmission: Secondary | ICD-10-CM

## 2013-10-05 DIAGNOSIS — B20 Human immunodeficiency virus [HIV] disease: Secondary | ICD-10-CM

## 2013-10-05 DIAGNOSIS — Z79899 Other long term (current) drug therapy: Secondary | ICD-10-CM

## 2013-10-05 LAB — LIPID PANEL
CHOLESTEROL: 188 mg/dL (ref 0–200)
HDL: 58 mg/dL (ref >39)
LDL Cholesterol: 100 mg/dL — ABNORMAL HIGH (ref 0–99)
TRIGLYCERIDES: 148 mg/dL (ref <150)
Total CHOL/HDL Ratio: 3.2 Ratio
VLDL: 30 mg/dL (ref 0–40)

## 2013-10-06 LAB — T-HELPER CELL (CD4) - (RCID CLINIC ONLY)
CD4 T CELL HELPER: 27 % — AB (ref 33–55)
CD4 T Cell Abs: 290 /uL — ABNORMAL LOW (ref 400–2700)

## 2013-10-06 LAB — RPR

## 2013-10-06 LAB — HIV-1 RNA QUANT-NO REFLEX-BLD
HIV 1 RNA Quant: <20 copies/mL (ref <20)
HIV-1 RNA Quant, Log: <1.3 {Log} (ref <1.30)

## 2013-10-14 ENCOUNTER — Encounter: Payer: Self-pay | Admitting: Internal Medicine

## 2013-10-14 ENCOUNTER — Ambulatory Visit (INDEPENDENT_AMBULATORY_CARE_PROVIDER_SITE_OTHER): Payer: Commercial Managed Care - HMO | Admitting: Internal Medicine

## 2013-10-14 VITALS — BP 102/72 | HR 82 | Temp 98.3°F | Ht 68.0 in | Wt 177.0 lb

## 2013-10-14 DIAGNOSIS — I10 Essential (primary) hypertension: Secondary | ICD-10-CM

## 2013-10-14 DIAGNOSIS — M2020 Hallux rigidus, unspecified foot: Secondary | ICD-10-CM

## 2013-10-14 DIAGNOSIS — G894 Chronic pain syndrome: Secondary | ICD-10-CM

## 2013-10-14 DIAGNOSIS — G8929 Other chronic pain: Secondary | ICD-10-CM

## 2013-10-14 DIAGNOSIS — J449 Chronic obstructive pulmonary disease, unspecified: Secondary | ICD-10-CM

## 2013-10-14 DIAGNOSIS — M205X1 Other deformities of toe(s) (acquired), right foot: Secondary | ICD-10-CM | POA: Insufficient documentation

## 2013-10-14 DIAGNOSIS — Z0000 Encounter for general adult medical examination without abnormal findings: Secondary | ICD-10-CM

## 2013-10-14 MED ORDER — TRAMADOL HCL 50 MG PO TABS: ORAL_TABLET | ORAL | Status: DC

## 2013-10-14 NOTE — Patient Instructions (Signed)
Please continue all other medications as before, and refills have been done if requested. Please have the pharmacy call with any other refills you may need.  Please continue your efforts at being more active, low cholesterol diet, and weight control.  Please keep your appointments with your specialists as you have planned  Please remember to sign up for MyChart if you have not done so, as this will be important to you in the future with finding out test results, communicating by private email, and scheduling acute appointments online when needed.  Please return in 6 months, or sooner if needed, with Lab testing done 3-5 days before

## 2013-10-14 NOTE — Assessment & Plan Note (Signed)
stable overall by history and exam, recent data reviewed with pt, and pt to continue medical treatment as before,  to f/u any worsening symptoms or concerns BP Readings from Last 3 Encounters:  10/14/13 102/72  06/17/13 118/82  04/16/13 132/86

## 2013-10-14 NOTE — Progress Notes (Signed)
Subjective:    Patient ID: Billy Brink., male    DOB: [birth year], 73 y.o.   MRN: [id number]  HPI  Here to f/u, overall doing ok. Denies worsening depressive symptoms, suicidal ideation, or panic.  Sees ID on a regular basis, had labs may 5, for OV later this month.  Pt denies chest pain, increased sob or doe, wheezing, orthopnea, PND, increased LE swelling, palpitations, dizziness or syncope   Pt denies polydipsia, polyuria.  Has gained several lbs, but trying to remain active.  Does have a worsening cockup toe secind toe right foot, asks for podiatry referral.  Still has not heard from pain clinic referral. No other current compalints Past Medical History  Diagnosis Date  . Hypertension   . HIV infection   . Substance abuse   . 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 Allergic rhinitis, cause unspecified 07/21/2011  . Cervical spondylosis 07/21/2011  . Lumbar spondylosis 07/21/2011  . Osteoarthritis 07/21/2011  . COPD (chronic obstructive pulmonary disease) 07/21/2011  . 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 Hx of radiation therapy 12/17/11 - 02/13/12    prostate  . Prostate cancer 09/19/11    gleason 7  . H/O blood clots   . Pneumonia   . GERD (gastroesophageal reflux disease)   . Hepatitis     Hep C  . Rectal bleeding    Past Surgical History  Procedure Laterality Date  . Hip surgery      total bilateral replacement  . Hernia repair    . Hand surgery      right thumb  . Foot surgery      bilat. ingrown toenails removed  . Colonoscopy N/A 11/17/2012    Procedure: COLONOSCOPY;  Surgeon: Ladene Artist, MD;  Location: [facility];  Service: Endoscopy;  Laterality: N/A;  possible APC    reports that he quit smoking about 53 years ago. His smoking use included Cigarettes. He has a 10 pack-year smoking history. He has never used smokeless tobacco. He reports that he does not drink alcohol or use illicit drugs. family history includes Alcohol abuse in his other; Arthritis in his other; Cancer  in his mother; Hypertension in his other. There is no history of Colon cancer or Diabetes. No Known Allergies Current Outpatient Prescriptions on File Prior to Visit  Medication Sig Dispense Refill  . cholecalciferol (VITAMIN D) 1000 UNITS tablet Take 1,000 Units by mouth every evening.      Marland Kitchen efavirenz-emtricitabine-tenofovir (ATRIPLA) 600-200-300 MG per tablet Take 1 tablet by mouth at bedtime.  30 tablet  11  . enalapril (VASOTEC) 20 MG tablet Take 1 tablet (20 mg total) by mouth every morning.  90 tablet  3  . feeding supplement, ENSURE COMPLETE, (ENSURE COMPLETE) LIQD Take 237 mLs by mouth 2 (two) times daily between meals.  474 mL  11  . hydrochlorothiazide (HYDRODIURIL) 25 MG tablet take 1 tablet by mouth once daily  90 tablet  3  . naproxen (NAPROSYN) 500 MG tablet take 1 tablet by mouth twice a day  60 tablet  11  . tamsulosin (FLOMAX) 0.4 MG CAPS take 1 capsule by mouth twice a day  60 capsule  0  . traMADol (ULTRAM) 50 MG tablet take 1 tablet by mouth every 6 hours if needed  120 tablet  2  . vitamin A 10000 UNIT capsule Take 10,000 Units by mouth every evening.      . vitamin C (ASCORBIC ACID) 500 MG tablet Take 500 mg by  mouth every evening.       No current facility-administered medications on file prior to visit.   Review of Systems  Constitutional: Negative for unusual diaphoresis or other sweats  HENT: Negative for ringing in ear Eyes: Negative for double vision or worsening visual disturbance.  Respiratory: Negative for choking and stridor.   Gastrointestinal: Negative for vomiting or other signifcant bowel change Genitourinary: Negative for hematuria or decreased urine volume.  Musculoskeletal: Negative for other MSK pain or swelling Skin: Negative for color change and worsening wound.  Neurological: Negative for tremors and numbness other than noted  Psychiatric/Behavioral: Negative for decreased concentration or agitation other than above       Objective:    Physical Exam BP 102/72  Pulse 82  Temp(Src) 98.3 F (36.8 C) (Oral)  Ht 5\' 8"  (1.727 m)  Wt 177 lb (80.287 kg)  BMI 26.92 kg/m2  SpO2 97% BP 102/72  Pulse 82  Temp(Src) 98.3 F (36.8 C) (Oral)  Ht 5\' 8"  (1.727 m)  Wt 177 lb (80.287 kg)  BMI 26.92 kg/m2  SpO2 97% VS noted,  Constitutional: Pt appears well-developed, well-nourished.  HENT: Head: NCAT.  Right Ear: External ear normal.  Left Ear: External ear normal.  Eyes: . Pupils are equal, round, and reactive to light. Conjunctivae and EOM are normal Neck: Normal range of motion. Neck supple.  Cardiovascular: Normal rate and regular rhythm.   Pulmonary/Chest: Effort normal and breath sounds normal.  Abd:  Soft, NT, ND, + BS Has right second toe cockup toe but no ulcer Neurological: Pt is alert. Not confused , motor grossly intact Skin: Skin is warm. No rash Psychiatric: Pt behavior is normal. No agitation. mild nervous    Assessment & Plan:

## 2013-10-14 NOTE — Assessment & Plan Note (Signed)
For podiatry referral 

## 2013-10-14 NOTE — Progress Notes (Signed)
Pre visit review using our clinic review tool, if applicable. No additional management support is needed unless otherwise documented below in the visit note. 

## 2013-10-14 NOTE — Assessment & Plan Note (Signed)
For re-try pain clinic referral

## 2013-10-14 NOTE — Assessment & Plan Note (Signed)
stable overall by history and exam, recent data reviewed with pt, and pt to continue medical treatment as before,  to f/u any worsening symptoms or concerns SpO2 Readings from Last 3 Encounters:  10/14/13 97%  04/16/13 93%  11/17/12 97%

## 2013-10-15 ENCOUNTER — Telehealth: Payer: Self-pay | Admitting: Internal Medicine

## 2013-10-15 NOTE — Telephone Encounter (Signed)
Relevant patient education mailed to patient.  

## 2013-10-19 ENCOUNTER — Ambulatory Visit (INDEPENDENT_AMBULATORY_CARE_PROVIDER_SITE_OTHER): Payer: Commercial Managed Care - HMO | Admitting: Internal Medicine

## 2013-10-19 ENCOUNTER — Encounter: Payer: Self-pay | Admitting: Internal Medicine

## 2013-10-19 VITALS — BP 117/77 | HR 76 | Temp 98.4°F | Wt 172.0 lb

## 2013-10-19 DIAGNOSIS — B20 Human immunodeficiency virus [HIV] disease: Secondary | ICD-10-CM

## 2013-10-19 DIAGNOSIS — B1710 Acute hepatitis C without hepatic coma: Secondary | ICD-10-CM

## 2013-10-19 NOTE — Assessment & Plan Note (Signed)
He is doing great on Atripla.  RTC 6 months.

## 2013-10-19 NOTE — Progress Notes (Signed)
  Subjective:    Patient ID: Billy Brink., male    DOB: [birth year], 73 y.o.   MRN: [id number]  HPI  For routine followup.  He continues on Atripla and denies any missed doses. His CD4 count,  Which has been chronically low despite viral suppression remains good and is 290 today. 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 He has no weight loss. He does continue to be active and goes to the [facility]. He has no complaints. His pain is controlled currently with medication and is being sent to a pain clinic.    He has also recently been treated with Harvoni for hepatitis C at the hepatology clinic.  Reports that he just finished and has been undetectable from a hep C standpoint with labs today.     Review of Systems  Constitutional: Negative for fever, fatigue and unexpected weight change.  HENT: Negative for sore throat and trouble swallowing.   Eyes: Negative for visual disturbance.  Respiratory: Negative for shortness of breath.   Cardiovascular: Negative for chest pain.  Gastrointestinal: Negative for nausea, abdominal pain and diarrhea.  Musculoskeletal: Negative for arthralgias and myalgias.  Skin: Negative for rash.  Neurological: Negative for headaches.  Hematological: Negative for adenopathy.  Psychiatric/Behavioral: The patient is not nervous/anxious.        Objective:   Physical Exam  Constitutional: He appears well-developed and well-nourished. No distress.  HENT:  Mouth/Throat: No oropharyngeal exudate.  Eyes: No scleral icterus.  Cardiovascular: Normal rate, regular rhythm and normal heart sounds.   No murmur heard. Pulmonary/Chest: Effort normal and breath sounds normal. No respiratory distress.  Lymphadenopathy:    He has no cervical adenopathy.  Skin: No rash noted.  Psychiatric: He has a normal mood and affect. His behavior is normal.          Assessment & Plan:

## 2013-10-19 NOTE — Assessment & Plan Note (Signed)
He has been treated and appparently is undetectable.  Has labs today which I believe is end of treatment.

## 2013-11-01 ENCOUNTER — Other Ambulatory Visit: Payer: Self-pay | Admitting: *Deleted

## 2013-11-01 DIAGNOSIS — B20 Human immunodeficiency virus [HIV] disease: Secondary | ICD-10-CM

## 2013-11-01 MED ORDER — EFAVIRENZ-EMTRICITAB-TENOFOVIR 600-200-300 MG PO TABS: 1.0000 | ORAL_TABLET | Freq: Every day | ORAL | Status: DC

## 2013-11-03 ENCOUNTER — Other Ambulatory Visit: Payer: Self-pay | Admitting: Nurse Practitioner

## 2013-11-03 DIAGNOSIS — C220 Liver cell carcinoma: Secondary | ICD-10-CM

## 2014-02-14 ENCOUNTER — Telehealth: Payer: Self-pay | Admitting: Internal Medicine

## 2014-02-14 NOTE — Telephone Encounter (Signed)
Pt called in said that dr Jenny Reichmann was suppose to be referring him to a pain Management Clinic.  He had the wrong medicaid card, now he is saying that he has received the correct one.

## 2014-03-11 ENCOUNTER — Other Ambulatory Visit: Payer: Commercial Managed Care - HMO

## 2014-03-11 DIAGNOSIS — B20 Human immunodeficiency virus [HIV] disease: Secondary | ICD-10-CM

## 2014-03-11 LAB — T-HELPER CELL (CD4) - (RCID CLINIC ONLY)
CD4 % Helper T Cell: 22 % — ABNORMAL LOW (ref 33–55)
CD4 T CELL ABS: 300 /uL — AB (ref 400–2700)

## 2014-03-12 LAB — HIV-1 RNA QUANT-NO REFLEX-BLD: HIV 1 RNA Quant: <20 copies/mL (ref <20)

## 2014-03-28 ENCOUNTER — Encounter (HOSPITAL_BASED_OUTPATIENT_CLINIC_OR_DEPARTMENT_OTHER): Payer: Self-pay | Admitting: *Deleted

## 2014-03-28 NOTE — Progress Notes (Signed)
Cannot come in for labs needs [id number]

## 2014-03-30 ENCOUNTER — Encounter: Payer: Self-pay | Admitting: Internal Medicine

## 2014-03-30 ENCOUNTER — Ambulatory Visit (INDEPENDENT_AMBULATORY_CARE_PROVIDER_SITE_OTHER): Payer: Commercial Managed Care - HMO | Admitting: Internal Medicine

## 2014-03-30 ENCOUNTER — Other Ambulatory Visit: Payer: Self-pay | Admitting: Physician Assistant

## 2014-03-30 VITALS — BP 99/68 | HR 92 | Temp 98.3°F | Wt 166.0 lb

## 2014-03-30 DIAGNOSIS — Z23 Encounter for immunization: Secondary | ICD-10-CM

## 2014-03-30 DIAGNOSIS — Z79899 Other long term (current) drug therapy: Secondary | ICD-10-CM

## 2014-03-30 DIAGNOSIS — Z113 Encounter for screening for infections with a predominantly sexual mode of transmission: Secondary | ICD-10-CM

## 2014-03-30 DIAGNOSIS — B20 Human immunodeficiency virus [HIV] disease: Secondary | ICD-10-CM

## 2014-03-30 NOTE — Assessment & Plan Note (Signed)
Doing great, CD 4 stable.  RTC 6 months.

## 2014-03-30 NOTE — Progress Notes (Signed)
  Subjective:    Patient ID: Billy Brink., male    DOB: 07-25-1940, 73 y.o.   MRN: [id number]  HPI For routine followup.  He continues on Atripla and denies any missed doses. His CD4 count,  Which has been chronically low despite viral suppression remains good and is 300 today.  Continues to have pain in both hips.        Review of Systems  Constitutional: Negative for fever, fatigue and unexpected weight change.  HENT: Negative for sore throat and trouble swallowing.   Eyes: Negative for visual disturbance.  Respiratory: Negative for shortness of breath.   Cardiovascular: Negative for chest pain.  Gastrointestinal: Negative for nausea, abdominal pain and diarrhea.  Musculoskeletal: Negative for arthralgias and myalgias.  Skin: Negative for rash.  Neurological: Negative for headaches.  Hematological: Negative for adenopathy.  Psychiatric/Behavioral: The patient is not nervous/anxious.        Objective:   Physical Exam  Constitutional: He appears well-developed and well-nourished. No distress.  HENT:  Mouth/Throat: No oropharyngeal exudate.  Eyes: No scleral icterus.  Cardiovascular: Normal rate, regular rhythm and normal heart sounds.   No murmur heard. Pulmonary/Chest: Effort normal and breath sounds normal. No respiratory distress.  Lymphadenopathy:    He has no cervical adenopathy.  Skin: No rash noted.  Psychiatric: He has a normal mood and affect. His behavior is normal.          Assessment & Plan:

## 2014-03-31 ENCOUNTER — Ambulatory Visit (HOSPITAL_BASED_OUTPATIENT_CLINIC_OR_DEPARTMENT_OTHER)
Admission: RE | Admit: 2014-03-31 | Discharge: 2014-04-01 | Disposition: A | Discharge status: Home / Self Care | Payer: Medicare HMO | Source: Ambulatory Visit | Attending: Orthopedic Surgery | Admitting: Orthopedic Surgery

## 2014-03-31 ENCOUNTER — Encounter (HOSPITAL_BASED_OUTPATIENT_CLINIC_OR_DEPARTMENT_OTHER): Payer: Self-pay | Admitting: Certified Registered"

## 2014-03-31 ENCOUNTER — Encounter (HOSPITAL_BASED_OUTPATIENT_CLINIC_OR_DEPARTMENT_OTHER)
Admission: RE | Disposition: A | Discharge status: Home / Self Care | Payer: Self-pay | Source: Ambulatory Visit | Attending: Orthopedic Surgery

## 2014-03-31 ENCOUNTER — Ambulatory Visit (HOSPITAL_BASED_OUTPATIENT_CLINIC_OR_DEPARTMENT_OTHER): Payer: Medicare HMO | Admitting: Certified Registered"

## 2014-03-31 ENCOUNTER — Encounter (HOSPITAL_BASED_OUTPATIENT_CLINIC_OR_DEPARTMENT_OTHER): Payer: Medicare HMO | Admitting: Certified Registered"

## 2014-03-31 DIAGNOSIS — I10 Essential (primary) hypertension: Secondary | ICD-10-CM | POA: Diagnosis not present

## 2014-03-31 DIAGNOSIS — M47896 Other spondylosis, lumbar region: Secondary | ICD-10-CM | POA: Insufficient documentation

## 2014-03-31 DIAGNOSIS — B1920 Unspecified viral hepatitis C without hepatic coma: Secondary | ICD-10-CM | POA: Diagnosis not present

## 2014-03-31 DIAGNOSIS — M1990 Unspecified osteoarthritis, unspecified site: Secondary | ICD-10-CM | POA: Diagnosis not present

## 2014-03-31 DIAGNOSIS — M2041 Other hammer toe(s) (acquired), right foot: Secondary | ICD-10-CM | POA: Insufficient documentation

## 2014-03-31 DIAGNOSIS — Z87891 Personal history of nicotine dependence: Secondary | ICD-10-CM | POA: Insufficient documentation

## 2014-03-31 DIAGNOSIS — F1121 Opioid dependence, in remission: Secondary | ICD-10-CM | POA: Diagnosis not present

## 2014-03-31 DIAGNOSIS — Z923 Personal history of irradiation: Secondary | ICD-10-CM | POA: Insufficient documentation

## 2014-03-31 DIAGNOSIS — K219 Gastro-esophageal reflux disease without esophagitis: Secondary | ICD-10-CM | POA: Insufficient documentation

## 2014-03-31 DIAGNOSIS — L84 Corns and callosities: Secondary | ICD-10-CM | POA: Diagnosis not present

## 2014-03-31 DIAGNOSIS — M205X1 Other deformities of toe(s) (acquired), right foot: Secondary | ICD-10-CM | POA: Insufficient documentation

## 2014-03-31 DIAGNOSIS — Z8546 Personal history of malignant neoplasm of prostate: Secondary | ICD-10-CM | POA: Insufficient documentation

## 2014-03-31 DIAGNOSIS — J449 Chronic obstructive pulmonary disease, unspecified: Secondary | ICD-10-CM | POA: Diagnosis not present

## 2014-03-31 DIAGNOSIS — H9192 Unspecified hearing loss, left ear: Secondary | ICD-10-CM | POA: Insufficient documentation

## 2014-03-31 DIAGNOSIS — B20 Human immunodeficiency virus [HIV] disease: Secondary | ICD-10-CM | POA: Diagnosis not present

## 2014-03-31 DIAGNOSIS — M47892 Other spondylosis, cervical region: Secondary | ICD-10-CM | POA: Insufficient documentation

## 2014-03-31 DIAGNOSIS — M205X9 Other deformities of toe(s) (acquired), unspecified foot: Secondary | ICD-10-CM | POA: Diagnosis present

## 2014-03-31 HISTORY — DX: Presence of dental prosthetic device (complete) (partial): Z97.2

## 2014-03-31 HISTORY — PX: AMPUTATION: SHX166

## 2014-03-31 HISTORY — DX: Presence of spectacles and contact lenses: Z97.3

## 2014-03-31 LAB — POCT I-STAT, CHEM 8
BUN: 30 mg/dL — AB (ref 6–23)
CREATININE: 2 mg/dL — AB (ref 0.50–1.35)
Calcium, Ion: 1.2 mmol/L (ref 1.13–1.30)
Chloride: 109 mEq/L (ref 96–112)
GLUCOSE: 116 mg/dL — AB (ref 70–99)
HCT: 42 % (ref 39.0–52.0)
HEMOGLOBIN: 14.3 g/dL (ref 13.0–17.0)
Potassium: 2.8 mEq/L — CL (ref 3.7–5.3)
Sodium: 140 mEq/L (ref 137–147)
TCO2: 16 mmol/L (ref 0–100)

## 2014-03-31 SURGERY — AMPUTATION, FOOT, RAY
Anesthesia: General | Site: Toe | Laterality: Right

## 2014-03-31 MED ORDER — EPHEDRINE SULFATE 50 MG/ML IJ SOLN
INTRAMUSCULAR | Status: DC | PRN
  Administered 2014-03-31 (×2): 10 mg via INTRAVENOUS
  Administered 2014-03-31: 20 mg via INTRAVENOUS
  Administered 2014-03-31: 25 mg via INTRAVENOUS

## 2014-03-31 MED ORDER — LACTATED RINGERS IV SOLN
INTRAVENOUS | Status: DC | PRN
  Administered 2014-03-31 (×2): via INTRAVENOUS

## 2014-03-31 MED ORDER — BACITRACIN ZINC 500 UNIT/GM EX OINT
TOPICAL_OINTMENT | CUTANEOUS | Status: DC | PRN
  Administered 2014-03-31: 1 via TOPICAL

## 2014-03-31 MED ORDER — ACETAMINOPHEN 500 MG PO TABS: 1000.0000 mg | ORAL_TABLET | Freq: Once | ORAL | Status: DC

## 2014-03-31 MED ORDER — FENTANYL CITRATE 0.05 MG/ML IJ SOLN: 50.0000 ug | INTRAMUSCULAR | Status: DC | PRN

## 2014-03-31 MED ORDER — ENALAPRIL MALEATE 20 MG PO TABS: 20.0000 mg | ORAL_TABLET | Freq: Every morning | ORAL | Status: DC

## 2014-03-31 MED ORDER — FENTANYL CITRATE 0.05 MG/ML IJ SOLN
INTRAMUSCULAR | Status: DC | PRN
  Administered 2014-03-31: 25 ug via INTRAVENOUS

## 2014-03-31 MED ORDER — PROPOFOL 10 MG/ML IV BOLUS
INTRAVENOUS | Status: DC | PRN
  Administered 2014-03-31: 100 mg via INTRAVENOUS

## 2014-03-31 MED ORDER — LIDOCAINE HCL (CARDIAC) 20 MG/ML IV SOLN
INTRAVENOUS | Status: DC | PRN
  Administered 2014-03-31: 60 mg via INTRAVENOUS

## 2014-03-31 MED ORDER — FENTANYL CITRATE 0.05 MG/ML IJ SOLN: 25.0000 ug | INTRAMUSCULAR | Status: DC | PRN

## 2014-03-31 MED ORDER — SODIUM CHLORIDE 0.9 % IV SOLN: INTRAVENOUS | Status: DC

## 2014-03-31 MED ORDER — HYDROCODONE-ACETAMINOPHEN 5-325 MG PO TABS: 1.0000 | ORAL_TABLET | Freq: Four times a day (QID) | ORAL | Status: DC | PRN

## 2014-03-31 MED ORDER — HYDROCODONE-ACETAMINOPHEN 5-325 MG PO TABS
1.0000 | ORAL_TABLET | Freq: Four times a day (QID) | ORAL | Status: DC | PRN
  Administered 2014-03-31: 1 via ORAL
  Administered 2014-03-31: 2 via ORAL
  Administered 2014-03-31: 1 via ORAL
  Administered 2014-04-01: 2 via ORAL
  Filled 2014-03-31: qty 2
  Filled 2014-03-31 (×4): qty 1

## 2014-03-31 MED ORDER — PHENYLEPHRINE HCL 10 MG/ML IJ SOLN
INTRAMUSCULAR | Status: DC | PRN
  Administered 2014-03-31: 80 ug via INTRAVENOUS
  Administered 2014-03-31: 40 ug via INTRAVENOUS

## 2014-03-31 MED ORDER — 0.9 % SODIUM CHLORIDE (POUR BTL) OPTIME
TOPICAL | Status: DC | PRN
  Administered 2014-03-31: 200 mL

## 2014-03-31 MED ORDER — ONDANSETRON HCL 4 MG/2ML IJ SOLN
4.0000 mg | Freq: Four times a day (QID) | INTRAMUSCULAR | Status: DC | PRN
Start: 2014-03-31 — End: 2014-04-01

## 2014-03-31 MED ORDER — LACTATED RINGERS IV SOLN: INTRAVENOUS | Status: DC

## 2014-03-31 MED ORDER — BUPIVACAINE-EPINEPHRINE (PF) 0.5% -1:200000 IJ SOLN
INTRAMUSCULAR | Status: AC
  Filled 2014-03-31: qty 30

## 2014-03-31 MED ORDER — EFAVIRENZ-EMTRICITAB-TENOFOVIR 600-200-300 MG PO TABS
1.0000 | ORAL_TABLET | Freq: Every day | ORAL | Status: DC
  Administered 2014-03-31: 1 via ORAL
  Filled 2014-03-31: qty 1

## 2014-03-31 MED ORDER — FENTANYL CITRATE 0.05 MG/ML IJ SOLN
INTRAMUSCULAR | Status: AC
  Filled 2014-03-31: qty 4

## 2014-03-31 MED ORDER — TAMSULOSIN HCL 0.4 MG PO CAPS
0.4000 mg | ORAL_CAPSULE | Freq: Every day | ORAL | Status: DC
  Administered 2014-03-31: 0.4 mg via ORAL
  Filled 2014-03-31: qty 1

## 2014-03-31 MED ORDER — MIDAZOLAM HCL 2 MG/2ML IJ SOLN: 1.0000 mg | INTRAMUSCULAR | Status: DC | PRN

## 2014-03-31 MED ORDER — CHLORHEXIDINE GLUCONATE 4 % EX LIQD: 60.0000 mL | Freq: Once | CUTANEOUS | Status: DC

## 2014-03-31 MED ORDER — ONDANSETRON HCL 4 MG/2ML IJ SOLN: 4.0000 mg | Freq: Once | INTRAMUSCULAR | Status: AC | PRN

## 2014-03-31 MED ORDER — BACITRACIN ZINC 500 UNIT/GM EX OINT
TOPICAL_OINTMENT | CUTANEOUS | Status: AC
  Filled 2014-03-31: qty 28.35

## 2014-03-31 MED ORDER — CEFAZOLIN SODIUM-DEXTROSE 2-3 GM-% IV SOLR: 2.0000 g | INTRAVENOUS | Status: DC

## 2014-03-31 MED ORDER — ONDANSETRON HCL 4 MG PO TABS: 4.0000 mg | ORAL_TABLET | Freq: Four times a day (QID) | ORAL | Status: DC | PRN

## 2014-03-31 MED ORDER — SODIUM CHLORIDE 0.9 % IV SOLN
INTRAVENOUS | Status: DC
  Administered 2014-03-31: 12:00:00 via INTRAVENOUS

## 2014-03-31 MED ORDER — DEXAMETHASONE SODIUM PHOSPHATE 10 MG/ML IJ SOLN
INTRAMUSCULAR | Status: DC | PRN
  Administered 2014-03-31: 4 mg via INTRAVENOUS

## 2014-03-31 MED ORDER — ONDANSETRON HCL 4 MG/2ML IJ SOLN
INTRAMUSCULAR | Status: DC | PRN
  Administered 2014-03-31: 4 mg via INTRAVENOUS

## 2014-03-31 MED ORDER — BUPIVACAINE-EPINEPHRINE 0.5% -1:200000 IJ SOLN
INTRAMUSCULAR | Status: DC | PRN
  Administered 2014-03-31: 10 mL

## 2014-03-31 MED ORDER — LIDOCAINE HCL (PF) 1 % IJ SOLN
INTRAMUSCULAR | Status: AC
  Filled 2014-03-31: qty 30

## 2014-03-31 SURGICAL SUPPLY — 66 items
BLADE AVERAGE 25MMX9MM (BLADE)
BLADE AVERAGE 25X9 (BLADE) ×1 IMPLANT
BLADE OSC/SAG .038X5.5 CUT EDG (BLADE) IMPLANT
BLADE SURG 10 STRL SS (BLADE) IMPLANT
BLADE SURG 15 STRL LF DISP TIS (BLADE) ×1 IMPLANT
BLADE SURG 15 STRL SS (BLADE) ×6
BNDG CMPR 9X4 STRL LF SNTH (GAUZE/BANDAGES/DRESSINGS) ×1
BNDG COHESIVE 4X5 TAN STRL (GAUZE/BANDAGES/DRESSINGS) ×3 IMPLANT
BNDG CONFORM 3 STRL LF (GAUZE/BANDAGES/DRESSINGS) IMPLANT
BNDG ESMARK 4X9 LF (GAUZE/BANDAGES/DRESSINGS) ×3 IMPLANT
CHLORAPREP W/TINT 26ML (MISCELLANEOUS) ×3 IMPLANT
CORDS BIPOLAR (ELECTRODE) IMPLANT
COVER BACK TABLE 60X90IN (DRAPES) ×3 IMPLANT
DECANTER SPIKE VIAL GLASS SM (MISCELLANEOUS) IMPLANT
DRAPE EXTREMITY TIBURON (DRAPES) ×3 IMPLANT
DRAPE OEC MINIVIEW 54X84 (DRAPES) IMPLANT
DRAPE SURG 17X23 STRL (DRAPES) ×3 IMPLANT
DRAPE U-SHAPE 47X51 STRL (DRAPES) IMPLANT
DRSG EMULSION OIL 3X3 NADH (GAUZE/BANDAGES/DRESSINGS) ×3 IMPLANT
DRSG PAD ABDOMINAL 8X10 ST (GAUZE/BANDAGES/DRESSINGS) ×2 IMPLANT
ELECT REM PT RETURN 9FT ADLT (ELECTROSURGICAL) ×3
ELECTRODE REM PT RTRN 9FT ADLT (ELECTROSURGICAL) ×1 IMPLANT
GAUZE SPONGE 4X4 12PLY STRL (GAUZE/BANDAGES/DRESSINGS) ×3 IMPLANT
GAUZE SPONGE 4X4 16PLY XRAY LF (GAUZE/BANDAGES/DRESSINGS) IMPLANT
GLOVE BIO SURGEON STRL SZ7 (GLOVE) ×1 IMPLANT
GLOVE BIO SURGEON STRL SZ8 (GLOVE) ×3 IMPLANT
GLOVE BIOGEL PI IND STRL 7.0 (GLOVE) ×1 IMPLANT
GLOVE BIOGEL PI IND STRL 8 (GLOVE) ×1 IMPLANT
GLOVE BIOGEL PI INDICATOR 7.0 (GLOVE) ×2
GLOVE BIOGEL PI INDICATOR 8 (GLOVE) ×2
GLOVE ECLIPSE 6.5 STRL STRAW (GLOVE) ×2 IMPLANT
GLOVE EXAM NITRILE MD LF STRL (GLOVE) ×2 IMPLANT
GOWN STRL REUS W/ TWL LRG LVL3 (GOWN DISPOSABLE) ×2 IMPLANT
GOWN STRL REUS W/ TWL XL LVL3 (GOWN DISPOSABLE) ×1 IMPLANT
GOWN STRL REUS W/TWL LRG LVL3 (GOWN DISPOSABLE) ×6
GOWN STRL REUS W/TWL XL LVL3 (GOWN DISPOSABLE) ×3
NDL HYPO 25X1 1.5 SAFETY (NEEDLE) IMPLANT
NDL SAFETY ECLIPSE 18X1.5 (NEEDLE) IMPLANT
NEEDLE HYPO 18GX1.5 SHARP (NEEDLE)
NEEDLE HYPO 25X1 1.5 SAFETY (NEEDLE) ×3 IMPLANT
NS IRRIG 1000ML POUR BTL (IV SOLUTION) ×3 IMPLANT
PACK BASIN DAY SURGERY FS (CUSTOM PROCEDURE TRAY) ×3 IMPLANT
PAD CAST 4YDX4 CTTN HI CHSV (CAST SUPPLIES) ×1 IMPLANT
PADDING CAST ABS 4INX4YD NS (CAST SUPPLIES)
PADDING CAST ABS COTTON 4X4 ST (CAST SUPPLIES) IMPLANT
PADDING CAST COTTON 4X4 STRL (CAST SUPPLIES) ×3
PENCIL BUTTON HOLSTER BLD 10FT (ELECTRODE) ×2 IMPLANT
SANITIZER HAND PURELL 535ML FO (MISCELLANEOUS) ×3 IMPLANT
SHEET MEDIUM DRAPE 40X70 STRL (DRAPES) ×3 IMPLANT
SLEEVE SCD COMPRESS KNEE MED (MISCELLANEOUS) ×3 IMPLANT
SPONGE LAP 18X18 X RAY DECT (DISPOSABLE) ×3 IMPLANT
STOCKINETTE 6  STRL (DRAPES) ×2
STOCKINETTE 6 STRL (DRAPES) ×1 IMPLANT
SUCTION FRAZIER TIP 10 FR DISP (SUCTIONS) IMPLANT
SUT ETHILON 2 0 FS 18 (SUTURE) IMPLANT
SUT ETHILON 2 0 FSLX (SUTURE) IMPLANT
SUT ETHILON 3 0 PS 1 (SUTURE) ×2 IMPLANT
SUT MNCRL AB 3-0 PS2 18 (SUTURE) IMPLANT
SWAB COLLECTION DEVICE MRSA (MISCELLANEOUS) IMPLANT
SYR BULB 3OZ (MISCELLANEOUS) ×3 IMPLANT
SYR CONTROL 10ML LL (SYRINGE) ×2 IMPLANT
TOWEL OR 17X24 6PK STRL BLUE (TOWEL DISPOSABLE) ×3 IMPLANT
TOWEL OR NON WOVEN STRL DISP B (DISPOSABLE) ×1 IMPLANT
TUBE CONNECTING 20'X1/4 (TUBING)
TUBE CONNECTING 20X1/4 (TUBING) IMPLANT
UNDERPAD 30X30 INCONTINENT (UNDERPADS AND DIAPERS) ×3 IMPLANT

## 2014-03-31 NOTE — OR Nursing (Signed)
Dr. Doran Durand advised no need to send toe for specimen pathology.

## 2014-03-31 NOTE — Op Note (Signed)
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  PROCEDURE:  Right second toe amputation through the proximal phalanx.  SURGEON:  Wylene Simmer, MD.  ANESTHESIA:  General.  ESTIMATED BLOOD LOSS:  Minimal.  TOURNIQUET TIME:  12 minutes with an ankle Esmarch.  COMPLICATIONS:  None apparent.  DISPOSITION:  Extubated, awake, and stable to recovery.  INDICATIONS FOR PROCEDURE:  The patient is a 73 year old male with past medical history significant for HIV and heroin addiction.  He has a painful second hammertoe that is crossing over the hallux to form a crossover toe deformity.  He has failed nonoperative treatment to date including activity modification, and shoe wear modification.  He presents now for amputation of the second toe.  We had a long discussion preoperatively about reconstruction including correction of his bunion and hammertoe deformity as well as the alternative of amputation.  He elects amputation.  He understands the risks and benefits, the alternative treatment options, and elects surgical treatment.  He specifically understands risks of bleeding, infection, nerve damage, blood clots, need for additional surgery, continued pain, recurrence of his deformity, revision amputation, and death.  PROCEDURE IN DETAIL:  After preoperative consent was obtained and the correct operative site was identified, the patient was brought to the operating room and placed supine on the operating table.  General anesthesia was induced.  Preoperative antibiotics were  administered. Surgical time-out was taken.  The right lower extremity was prepped and draped in standard sterile fashion.  A digital block was then performed with Marcaine.  The foot was exsanguinated and a 4-inch Esmarch tourniquet was wrapped around the ankle.  A fishmouth incision was marked on the skin adjacent to the PIP joint and proximal to the area of the chronic dorsal callus.  The incision was made.  Sharp dissection was carried down through skin and subcutaneous tissue to the level of the bone.  Subperiosteal dissection was then carried along the shaft of the proximal phalanx.  The proximal phalanx was cut with a bone cutter and the toe was passed off the field.  The neurovascular bundles were cauterized.  The wound was irrigated copiously.  The incision was closed over the end of bone with a 3-0 nylon sutures.  Sterile dressings were applied followed by compression wrap.  Tourniquet was released after application of the dressings at 12 minutes.  The patient was awakened from anesthesia and transported to the recovery room in stable condition.  FOLLOWUP PLAN:  The patient will be observed overnight for pain control and management of his blood pressure.  He will follow up with me in the office in 2 weeks for suture removal.  He will bear weight as tolerated in a flat  postop shoe.     Wylene Simmer, MD     JH/MEDQ  D:  03/31/2014  T:  03/31/2014  Job:  [id number]

## 2014-03-31 NOTE — H&P (Signed)
Billy Lopez. is an 73 y.o. male.   Chief Complaint:  Right 2nd toe pain HPI:  73 y/o male with PMH of drug abuse, HIV and hepatitis c/o pain at his second hammertoe deformity.  He has failed nonoperative treatment and presents now for amputation of the right 2nd toe.  Past Medical History  Diagnosis Date  . Hypertension   . HIV infection   . Substance abuse   . 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 Allergic rhinitis, cause unspecified 07/21/2011  . Cervical spondylosis 07/21/2011  . Lumbar spondylosis 07/21/2011  . Osteoarthritis 07/21/2011  . COPD (chronic obstructive pulmonary disease) 07/21/2011  . 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 Hx of radiation therapy 12/17/11 - 02/13/12    prostate  . Prostate cancer 09/19/11    gleason 7  . H/O blood clots   . Pneumonia   . GERD (gastroesophageal reflux disease)   . Hepatitis     Hep C  . Rectal bleeding   . Wears dentures     top  . Wears glasses     Past Surgical History  Procedure Laterality Date  . Hip surgery      total bilateral replacement  . Hernia repair    . Hand surgery      right thumb  . Foot surgery      bilat. ingrown toenails removed  . Colonoscopy N/A 11/17/2012    Procedure: COLONOSCOPY;  Surgeon: Ladene Artist, MD;  Location: [facility];  Service: Endoscopy;  Laterality: N/A;  possible APC    Family History  Problem Relation Age of Onset  . Alcohol abuse Other   . Arthritis Other   . Hypertension Other   . Colon cancer Neg Hx   . Diabetes Neg Hx   . Cancer Mother     unsure of type   Social History:  reports that he quit smoking about 53 years ago. His smoking use included Cigarettes. He has a 10 pack-year smoking history. He has never used smokeless tobacco. He reports that he does not drink alcohol or use illicit drugs.  Allergies: No Known Allergies  Medications Prior to Admission  Medication Sig Dispense Refill  . cholecalciferol (VITAMIN D) 1000 UNITS tablet Take 1,000 Units by mouth every  evening.      Marland Kitchen efavirenz-emtricitabine-tenofovir (ATRIPLA) 600-200-300 MG per tablet Take 1 tablet by mouth at bedtime.  30 tablet  11  . enalapril (VASOTEC) 20 MG tablet Take 1 tablet (20 mg total) by mouth every morning.  90 tablet  3  . feeding supplement, ENSURE COMPLETE, (ENSURE COMPLETE) LIQD Take 237 mLs by mouth 2 (two) times daily between meals.  474 mL  11  . hydrochlorothiazide (HYDRODIURIL) 25 MG tablet take 1 tablet by mouth once daily  90 tablet  3  . naproxen (NAPROSYN) 500 MG tablet take 1 tablet by mouth twice a day  60 tablet  11  . tamsulosin (FLOMAX) 0.4 MG CAPS take 1 capsule by mouth twice a day  60 capsule  0  . traMADol (ULTRAM) 50 MG tablet take 1 tablet by mouth every 6 hours if needed  120 tablet  5  . vitamin A 10000 UNIT capsule Take 10,000 Units by mouth every evening.      . vitamin C (ASCORBIC ACID) 500 MG tablet Take 500 mg by mouth every evening.        No results found for this or any previous visit (from the past 48 hour(s)). No results  found.  ROS  No recent f/c/n/v/wt loss  Blood pressure 91/60, pulse 81, temperature 98.3 F (36.8 C), temperature source Oral, resp. rate 16, height 5\' 8"  (1.727 m), weight 75.751 kg (167 lb), SpO2 97.00%. Physical Exam  Thin elderly male in nad.  A and o x 4.  Mood and affect normal.  EOMI.  Edentulous.  Gait is normal.  R foot with 2nd hammer / crossover toe deformity.  Callous over 2nd toe PIP joint.  Skin otherwise healthy and intact.  Sens to LT intact at the forefoot.  5/5 strength in PF and DF of the ankle.  Assessment/Plan R 2nd hammertoe / crossover toe deformity - to OR today for amputation of the right foot 2nd toe.  The risks and benefits of the alternative treatment options have been discussed in detail.  The patient wishes to proceed with surgery and specifically understands risks of bleeding, infection, nerve damage, blood clots, need for additional surgery, amputation and death.   HARSHAAN, WHANG April 08, 2014,  8:39 AM

## 2014-03-31 NOTE — Transfer of Care (Signed)
Immediate Anesthesia Transfer of Care Note  Patient: Billy Lopez.  Procedure(s) Performed: Procedure(s): RIGHT SECOND TOE AMPUTATION  (Right)  Patient Location: PACU  Anesthesia Type:General  Level of Consciousness: awake, alert , oriented and patient cooperative  Airway & Oxygen Therapy: Patient Spontanous Breathing and Patient connected to face mask oxygen  Post-op Assessment: Report given to PACU RN and Post -op Vital signs reviewed and stable  Post vital signs: Reviewed and stable  Complications: No apparent anesthesia complications

## 2014-03-31 NOTE — Anesthesia Postprocedure Evaluation (Signed)
  Anesthesia Post-op Note  Patient: Billy Lopez.  Procedure(s) Performed: Procedure(s): RIGHT SECOND TOE AMPUTATION  (Right)  Patient Location: PACU  Anesthesia Type: General   Level of Consciousness: awake, alert  and oriented  Airway and Oxygen Therapy: Patient Spontanous Breathing  Post-op Pain: mild  Post-op Assessment: Post-op Vital signs reviewed  Post-op Vital Signs: Reviewed  Last Vitals:  Filed Vitals:   03/31/14 1015  BP: 103/62  Pulse: 81  Temp:   Resp: 14    Complications: No apparent anesthesia complications

## 2014-03-31 NOTE — Anesthesia Procedure Notes (Signed)
Procedure Name: LMA Insertion Date/Time: 03/31/2014 9:04 AM Performed by: Bellanie Matthew Pre-anesthesia Checklist: Patient identified, Emergency Drugs available, Suction available and Patient being monitored Patient Re-evaluated:Patient Re-evaluated prior to inductionOxygen Delivery Method: Circle System Utilized Preoxygenation: Pre-oxygenation with 100% oxygen Intubation Type: IV induction Ventilation: Mask ventilation without difficulty LMA: LMA inserted LMA Size: 4.0 Number of attempts: 1 Airway Equipment and Method: bite block Placement Confirmation: positive ETCO2 Tube secured with: Tape Dental Injury: Teeth and Oropharynx as per pre-operative assessment

## 2014-03-31 NOTE — Brief Op Note (Signed)
03/31/2014  9:25 AM  PATIENT:  Billy Lopez.  73 y.o. male  PRE-OPERATIVE DIAGNOSIS:  Right 2nd hammer / claw toe deformity  POST-OPERATIVE DIAGNOSIS:  same  Procedure(s):  Right 2nd toe amputation   SURGEON:  Wylene Simmer, MD  ASSISTANT: n/a  ANESTHESIA:   General  EBL:  minimal   TOURNIQUET:   Total Tourniquet Time Documented: Calf (Right) - 12 minutes Total: Calf (Right) - 12 minutes   COMPLICATIONS:  None apparent  DISPOSITION:  Extubated, awake and stable to recovery.  DICTATION ID:  [id number]

## 2014-03-31 NOTE — Anesthesia Preprocedure Evaluation (Addendum)
Anesthesia Evaluation  Patient identified by MRN, date of birth, ID band Patient awake    Reviewed: Allergy & Precautions, H&P , NPO status , Patient's Chart, lab work & pertinent test results  Airway Mallampati: I  TM Distance: >3 FB Neck ROM: Full    Dental  (+) Edentulous Upper, Edentulous Lower, Dental Advisory Given   Pulmonary pneumonia -, COPDformer smoker,  breath sounds clear to auscultation        Cardiovascular hypertension, Pt. on medications Rhythm:Regular Rate:Normal     Neuro/Psych    GI/Hepatic GERD-  Medicated,(+) Hepatitis -, C  Endo/Other    Renal/GU Renal disease     Musculoskeletal   Abdominal   Peds  Hematology   Anesthesia Other Findings HIV   Reproductive/Obstetrics                            Anesthesia Physical Anesthesia Plan  ASA: III  Anesthesia Plan: General   Post-op Pain Management:    Induction: Intravenous  Airway Management Planned: LMA  Additional Equipment:   Intra-op Plan:   Post-operative Plan: Extubation in OR  Informed Consent: I have reviewed the patients History and Physical, chart, labs and discussed the procedure including the risks, benefits and alternatives for the proposed anesthesia with the patient or authorized representative who has indicated his/her understanding and acceptance.   Dental advisory given  Plan Discussed with: CRNA, Anesthesiologist and Surgeon  Anesthesia Plan Comments:         Anesthesia Quick Evaluation

## 2014-03-31 NOTE — Discharge Instructions (Signed)
,  Wylene Simmer, MD [place]  Please read the following information regarding your care after surgery.  Medications  You only need a prescription for the narcotic pain medicine (ex. oxycodone, Percocet, Norco).  All of the other medicines listed below are available over the counter. X hydrocodone as prescribed for severe pain  Narcotic pain medicine (ex. oxycodone, Percocet, Vicodin) will cause constipation.  To prevent this problem, take the following medicines while you are taking any pain medicine. X docusate sodium (Colace) 100 mg twice a day X senna (Senokot) 2 tablets twice a day  Weight Bearing X Bear weight when you are able on your operated leg or foot in the post-op shoe.  Cast / Splint / Dressing X Keep your dressing clean and dry.  Dont put anything (coat hanger, pencil, etc) down inside of it.  If it gets damp, use a hair dryer on the cool setting to dry it.  If it gets soaked, call the office to schedule an appointment for a dressing change.  After your dressing, cast or splint is removed; you may shower, but do not soak or scrub the wound.  Allow the water to run over it, and then gently pat it dry.  Swelling It is normal for you to have swelling where you had surgery.  To reduce swelling and pain, keep your toes above your nose for at least 3 days after surgery.  It may be necessary to keep your foot or leg elevated for several weeks.  If it hurts, it should be elevated.  Follow Up Call my office at [phone] when you are discharged from the hospital or surgery center to schedule an appointment to be seen two weeks after surgery.  Call my office at [phone] if you develop a fever >101.5 F, nausea, vomiting, bleeding from the surgical site or severe pain.

## 2014-03-31 NOTE — Progress Notes (Signed)
Patient states he has medical transportation driver here, and home caretaker "Isaias Sakai" to be with him at home for 24 hours. Was unable to reach [place] via phone at [place]. She returned call at 0900 and states she will not be able to stay with patient 24 hours. Spoke with Sherron Monday AD and Dr Al Corpus. Patient will have to stay overnight if no home care for 24 hours. [place] to advise patient and overnight staff.

## 2014-04-01 ENCOUNTER — Encounter (HOSPITAL_BASED_OUTPATIENT_CLINIC_OR_DEPARTMENT_OTHER): Payer: Self-pay | Admitting: Orthopedic Surgery

## 2014-04-01 DIAGNOSIS — M2041 Other hammer toe(s) (acquired), right foot: Secondary | ICD-10-CM | POA: Diagnosis not present

## 2014-04-06 ENCOUNTER — Telehealth: Payer: Self-pay | Admitting: Internal Medicine

## 2014-04-06 MED ORDER — ENSURE COMPLETE PO LIQD: 237.0000 mL | Freq: Two times a day (BID) | ORAL | Status: DC

## 2014-04-06 NOTE — Telephone Encounter (Signed)
Pt called in and is requesting for feeding supplement, ENSURE COMPLETE, (ENSURE COMPLETE) LIQD [[id number]]  Be sent in.  He is completely out

## 2014-04-06 NOTE — Telephone Encounter (Signed)
Called the patient printed script and faxed to number patient requested [phone].

## 2014-04-11 ENCOUNTER — Other Ambulatory Visit: Payer: Commercial Managed Care - HMO

## 2014-04-18 ENCOUNTER — Other Ambulatory Visit: Payer: Self-pay | Admitting: Internal Medicine

## 2014-04-19 ENCOUNTER — Encounter: Payer: Self-pay | Admitting: Internal Medicine

## 2014-04-19 ENCOUNTER — Ambulatory Visit (INDEPENDENT_AMBULATORY_CARE_PROVIDER_SITE_OTHER): Payer: Commercial Managed Care - HMO | Admitting: Internal Medicine

## 2014-04-19 ENCOUNTER — Ambulatory Visit (INDEPENDENT_AMBULATORY_CARE_PROVIDER_SITE_OTHER)
Admission: RE | Admit: 2014-04-19 | Discharge: 2014-04-19 | Disposition: A | Discharge status: Home / Self Care | Payer: Commercial Managed Care - HMO | Source: Ambulatory Visit | Attending: Internal Medicine | Admitting: Internal Medicine

## 2014-04-19 ENCOUNTER — Other Ambulatory Visit (INDEPENDENT_AMBULATORY_CARE_PROVIDER_SITE_OTHER): Payer: Commercial Managed Care - HMO

## 2014-04-19 VITALS — BP 130/82 | HR 93 | Temp 98.0°F | Ht 68.0 in | Wt 163.2 lb

## 2014-04-19 DIAGNOSIS — R059 Cough, unspecified: Secondary | ICD-10-CM | POA: Insufficient documentation

## 2014-04-19 DIAGNOSIS — R05 Cough: Secondary | ICD-10-CM

## 2014-04-19 DIAGNOSIS — Z0000 Encounter for general adult medical examination without abnormal findings: Secondary | ICD-10-CM

## 2014-04-19 LAB — CBC WITH DIFFERENTIAL/PLATELET
BASOS PCT: 0.9 % (ref 0.0–3.0)
Basophils Absolute: 0.1 10*3/uL (ref 0.0–0.1)
EOS PCT: 2.8 % (ref 0.0–5.0)
Eosinophils Absolute: 0.2 10*3/uL (ref 0.0–0.7)
HEMATOCRIT: 42.2 % (ref 39.0–52.0)
HEMOGLOBIN: 13.8 g/dL (ref 13.0–17.0)
LYMPHS ABS: 1.3 10*3/uL (ref 0.7–4.0)
LYMPHS PCT: 19.3 % (ref 12.0–46.0)
MCHC: 32.8 g/dL (ref 30.0–36.0)
MCV: 86.9 fl (ref 78.0–100.0)
Monocytes Absolute: 1 10*3/uL (ref 0.1–1.0)
Monocytes Relative: 14.1 % — ABNORMAL HIGH (ref 3.0–12.0)
NEUTROS ABS: 4.3 10*3/uL (ref 1.4–7.7)
Neutrophils Relative %: 62.9 % (ref 43.0–77.0)
Platelets: 302 10*3/uL (ref 150.0–400.0)
RBC: 4.86 Mil/uL (ref 4.22–5.81)
RDW: 15.7 % — AB (ref 11.5–15.5)
WBC: 6.8 10*3/uL (ref 4.0–10.5)

## 2014-04-19 LAB — URINALYSIS, ROUTINE W REFLEX MICROSCOPIC
Bilirubin Urine: NEGATIVE
KETONES UR: NEGATIVE
Leukocytes, UA: NEGATIVE
Nitrite: NEGATIVE
SPECIFIC GRAVITY, URINE: 1.02 (ref 1.000–1.030)
TOTAL PROTEIN, URINE-UPE24: NEGATIVE
UROBILINOGEN UA: 0.2 (ref 0.0–1.0)
Urine Glucose: NEGATIVE
pH: 6 (ref 5.0–8.0)

## 2014-04-19 LAB — PSA: PSA: 10.63 ng/mL — ABNORMAL HIGH (ref 0.10–4.00)

## 2014-04-19 LAB — TSH: TSH: 2.41 u[IU]/mL (ref 0.35–4.50)

## 2014-04-19 MED ORDER — TRAMADOL HCL 50 MG PO TABS: ORAL_TABLET | ORAL | Status: DC

## 2014-04-19 MED ORDER — ENSURE COMPLETE PO LIQD: 237.0000 mL | Freq: Two times a day (BID) | ORAL | Status: DC

## 2014-04-19 NOTE — Progress Notes (Signed)
Subjective:    Patient ID: Billy Brink., male    DOB: 06-Jun-1940, 73 y.o.   MRN: [id number]  HPI  Here for wellness and f/u;  Overall doing ok;  Pt denies CP, worsening SOB, DOE, wheezing, orthopnea, PND, worsening LE edema, palpitations, dizziness or syncope, but does have a recurring cough for several weeks, scant prod, no overt allergy/asthma/reflux symtpoms.   Pt denies neurological change such as new headache, facial or extremity weakness.  Pt denies polydipsia, polyuria, or low sugar symptoms. Pt states overall good compliance with treatment and medications, good tolerability, and has been trying to follow lower cholesterol diet.  Pt denies worsening depressive symptoms, suicidal ideation or panic. No fever, night sweats, wt loss, loss of appetite, or other constitutional symptoms.  Pt states good ability with ADL's, has low fall risk, home safety reviewed and adequate, no other significant changes in hearing or vision, chronic pain controlled, needs refills tramadol. S/p right hammertoe surgury. Asks for a rx for Ensure to send to the local [facility] program.  Past Medical History  Diagnosis Date  . Hypertension   . HIV infection   . Substance abuse   . 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 Allergic rhinitis, cause unspecified 07/21/2011  . Cervical spondylosis 07/21/2011  . Lumbar spondylosis 07/21/2011  . Osteoarthritis 07/21/2011  . COPD (chronic obstructive pulmonary disease) 07/21/2011  . 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 Hx of radiation therapy 12/17/11 - 02/13/12    prostate  . Prostate cancer 09/19/11    gleason 7  . H/O blood clots   . Pneumonia   . GERD (gastroesophageal reflux disease)   . Hepatitis     Hep C  . Rectal bleeding   . Wears dentures     top  . Wears glasses    Past Surgical History  Procedure Laterality Date  . Hip surgery      total bilateral replacement  . Hernia repair    . Hand surgery      right thumb  . Foot surgery      bilat. ingrown toenails removed    . Colonoscopy N/A 11/17/2012    Procedure: COLONOSCOPY;  Surgeon: Ladene Artist, MD;  Location: [facility];  Service: Endoscopy;  Laterality: N/A;  possible APC  . Amputation Right 03/31/2014    Procedure: RIGHT SECOND TOE AMPUTATION ;  Surgeon: Wylene Simmer, MD;  Location: [place];  Service: Orthopedics;  Laterality: Right;    reports that he quit smoking about 53 years ago. His smoking use included Cigarettes. He has a 10 pack-year smoking history. He has never used smokeless tobacco. He reports that he does not drink alcohol or use illicit drugs. family history includes Alcohol abuse in his other; Arthritis in his other; Cancer in his mother; Hypertension in his other. There is no history of Colon cancer or Diabetes. No Known Allergies Current Outpatient Prescriptions on File Prior to Visit  Medication Sig Dispense Refill  . cholecalciferol (VITAMIN D) 1000 UNITS tablet Take 1,000 Units by mouth every evening.    Marland Kitchen efavirenz-emtricitabine-tenofovir (ATRIPLA) 600-200-300 MG per tablet Take 1 tablet by mouth at bedtime. 30 tablet 11  . enalapril (VASOTEC) 20 MG tablet Take 1 tablet (20 mg total) by mouth every morning. 90 tablet 3  . hydrochlorothiazide (HYDRODIURIL) 25 MG tablet take 1 tablet by mouth once daily 90 tablet 3  . naproxen (NAPROSYN) 500 MG tablet take 1 tablet by mouth twice a day 60 tablet 11  .  tamsulosin (FLOMAX) 0.4 MG CAPS take 1 capsule by mouth twice a day 60 capsule 0  . vitamin A 10000 UNIT capsule Take 10,000 Units by mouth every evening.    . vitamin C (ASCORBIC ACID) 500 MG tablet Take 500 mg by mouth every evening.     No current facility-administered medications on file prior to visit.    Review of Systems Constitutional: Negative for increased diaphoresis, other activity, appetite or other siginficant weight change  HENT: Negative for worsening hearing loss, ear pain, facial swelling, mouth sores and neck stiffness.   Eyes: Negative for  other worsening pain, redness or visual disturbance.  Respiratory: Negative for shortness of breath and wheezing.   Cardiovascular: Negative for chest pain and palpitations.  Gastrointestinal: Negative for diarrhea, blood in stool, abdominal distention or other pain Genitourinary: Negative for hematuria, flank pain or change in urine volume.  Musculoskeletal: Negative for myalgias or other joint complaints.  Skin: Negative for color change and wound.  Neurological: Negative for syncope and numbness. other than noted Hematological: Negative for adenopathy. or other swelling Psychiatric/Behavioral: Negative for hallucinations, self-injury, decreased concentration or other worsening agitation.      Objective:   Physical Exam BP 130/82 mmHg  Pulse 93  Temp(Src) 98 F (36.7 C) (Oral)  Ht 5\' 8"  (1.727 m)  Wt 163 lb 4 oz (74.05 kg)  BMI 24.83 kg/m2  SpO2 94% VS noted,  Constitutional: Pt is oriented to person, place, and time. Appears well-developed and well-nourished.  Head: Normocephalic and atraumatic.  Right Ear: External ear normal.  Left Ear: External ear normal.  Nose: Nose normal.  Mouth/Throat: Oropharynx is clear and moist.  Eyes: Conjunctivae and EOM are normal. Pupils are equal, round, and reactive to light.  Neck: Normal range of motion. Neck supple. No JVD present. No tracheal deviation present.  Cardiovascular: Normal rate, regular rhythm, normal heart sounds and intact distal pulses.   Pulmonary/Chest: Effort normal and breath sounds without rales or wheezing  Abdominal: Soft. Bowel sounds are normal. NT. No HSM  Musculoskeletal: Normal range of motion. Exhibits no edema.  Lymphadenopathy:  Has no cervical adenopathy.  Neurological: Pt is alert and oriented to person, place, and time. Pt has normal reflexes. No cranial nerve deficit. Motor grossly intact Skin: Skin is warm and dry. No rash noted.  Psychiatric:  Has normal mood and affect. Behavior is normal.       Assessment & Plan:

## 2014-04-19 NOTE — Progress Notes (Signed)
Pre visit review using our clinic review tool, if applicable. No additional management support is needed unless otherwise documented below in the visit note. 

## 2014-04-19 NOTE — Patient Instructions (Addendum)
Your ensure prescription was faxed today  Please continue all other medications as before, and refills have been done if requested - including the tramadol  Please have the pharmacy call with any other refills you may need.  Please continue your efforts at being more active, low cholesterol diet, and weight control.  You are otherwise up to date with prevention measures today.  Please keep your appointments with your specialists as you may have planned  Please go to the XRAY Department in the Basement (go straight as you get off the elevator) for the x-ray testing  Please go to the LAB in the Basement (turn left off the elevator) for the tests to be done today  You will be contacted by phone if any changes need to be made immediately.  Otherwise, you will receive a letter about your results with an explanation, but please check with MyChart first.  Please remember to sign up for MyChart if you have not done so, as this will be important to you in the future with finding out test results, communicating by private email, and scheduling acute appointments online when needed.  Please return in 6 months, or sooner if needed

## 2014-04-19 NOTE — Assessment & Plan Note (Signed)

## 2014-04-19 NOTE — Assessment & Plan Note (Signed)
Etiology unclear, for cxr, cont same tx for now pending results

## 2014-04-20 ENCOUNTER — Encounter: Payer: Self-pay | Admitting: Internal Medicine

## 2014-04-20 LAB — LIPID PANEL
CHOLESTEROL: 204 mg/dL — AB (ref 0–200)
HDL: 55.2 mg/dL (ref >39.00)
LDL CALC: 123 mg/dL — AB (ref 0–99)
NonHDL: 148.8
Total CHOL/HDL Ratio: 4
Triglycerides: 130 mg/dL (ref 0.0–149.0)
VLDL: 26 mg/dL (ref 0.0–40.0)

## 2014-04-20 LAB — HEPATIC FUNCTION PANEL
ALK PHOS: 128 U/L — AB (ref 39–117)
ALT: 20 U/L (ref 0–53)
AST: 32 U/L (ref 0–37)
Albumin: 4 g/dL (ref 3.5–5.2)
Bilirubin, Direct: 0.1 mg/dL (ref 0.0–0.3)
TOTAL PROTEIN: 7.9 g/dL (ref 6.0–8.3)
Total Bilirubin: 0.5 mg/dL (ref 0.2–1.2)

## 2014-04-20 LAB — BASIC METABOLIC PANEL
BUN: 21 mg/dL (ref 6–23)
CALCIUM: 9.6 mg/dL (ref 8.4–10.5)
CO2: 18 mEq/L — ABNORMAL LOW (ref 19–32)
Chloride: 112 mEq/L (ref 96–112)
Creatinine, Ser: 1.5 mg/dL (ref 0.4–1.5)
GFR: 50.48 mL/min — AB (ref >60.00)
GLUCOSE: 111 mg/dL — AB (ref 70–99)
Potassium: 3.9 mEq/L (ref 3.5–5.1)
SODIUM: 141 meq/L (ref 135–145)

## 2014-05-03 ENCOUNTER — Ambulatory Visit: Payer: Commercial Managed Care - HMO | Admitting: Internal Medicine

## 2014-06-13 DIAGNOSIS — C61 Malignant neoplasm of prostate: Secondary | ICD-10-CM | POA: Diagnosis not present

## 2014-06-20 DIAGNOSIS — N5201 Erectile dysfunction due to arterial insufficiency: Secondary | ICD-10-CM | POA: Diagnosis not present

## 2014-06-20 DIAGNOSIS — C61 Malignant neoplasm of prostate: Secondary | ICD-10-CM | POA: Diagnosis not present

## 2014-06-21 ENCOUNTER — Other Ambulatory Visit: Payer: Self-pay | Admitting: Urology

## 2014-06-21 DIAGNOSIS — C61 Malignant neoplasm of prostate: Secondary | ICD-10-CM

## 2014-07-04 ENCOUNTER — Other Ambulatory Visit: Payer: Self-pay | Admitting: Internal Medicine

## 2014-07-05 ENCOUNTER — Encounter (HOSPITAL_COMMUNITY)
Admission: RE | Admit: 2014-07-05 | Discharge: 2014-07-05 | Disposition: A | Discharge status: Home / Self Care | Payer: Commercial Managed Care - HMO | Source: Ambulatory Visit | Attending: Urology | Admitting: Urology

## 2014-07-05 DIAGNOSIS — C61 Malignant neoplasm of prostate: Secondary | ICD-10-CM

## 2014-07-05 DIAGNOSIS — Z96643 Presence of artificial hip joint, bilateral: Secondary | ICD-10-CM | POA: Diagnosis not present

## 2014-07-05 DIAGNOSIS — R591 Generalized enlarged lymph nodes: Secondary | ICD-10-CM | POA: Diagnosis not present

## 2014-07-05 MED ORDER — TECHNETIUM TC 99M MEDRONATE IV KIT
26.5000 | PACK | Freq: Once | INTRAVENOUS | Status: AC | PRN
  Administered 2014-07-05: 26.5 via INTRAVENOUS

## 2014-07-12 DIAGNOSIS — K7469 Other cirrhosis of liver: Secondary | ICD-10-CM | POA: Diagnosis not present

## 2014-07-12 DIAGNOSIS — B182 Chronic viral hepatitis C: Secondary | ICD-10-CM | POA: Diagnosis not present

## 2014-07-14 ENCOUNTER — Other Ambulatory Visit (HOSPITAL_COMMUNITY): Payer: Self-pay | Admitting: Nurse Practitioner

## 2014-07-14 DIAGNOSIS — B182 Chronic viral hepatitis C: Secondary | ICD-10-CM

## 2014-07-19 DIAGNOSIS — B20 Human immunodeficiency virus [HIV] disease: Secondary | ICD-10-CM | POA: Diagnosis not present

## 2014-08-11 ENCOUNTER — Ambulatory Visit (HOSPITAL_COMMUNITY)
Admission: RE | Admit: 2014-08-11 | Discharge: 2014-08-11 | Disposition: A | Discharge status: Home / Self Care | Payer: Commercial Managed Care - HMO | Source: Ambulatory Visit | Attending: Nurse Practitioner | Admitting: Nurse Practitioner

## 2014-08-11 DIAGNOSIS — R6889 Other general symptoms and signs: Secondary | ICD-10-CM | POA: Diagnosis not present

## 2014-08-11 DIAGNOSIS — B1920 Unspecified viral hepatitis C without hepatic coma: Secondary | ICD-10-CM | POA: Diagnosis not present

## 2014-08-11 DIAGNOSIS — B182 Chronic viral hepatitis C: Secondary | ICD-10-CM | POA: Diagnosis not present

## 2014-08-17 DIAGNOSIS — B20 Human immunodeficiency virus [HIV] disease: Secondary | ICD-10-CM | POA: Diagnosis not present

## 2014-09-12 ENCOUNTER — Telehealth: Payer: Self-pay | Admitting: Internal Medicine

## 2014-09-12 ENCOUNTER — Other Ambulatory Visit: Payer: Commercial Managed Care - HMO

## 2014-09-12 DIAGNOSIS — B20 Human immunodeficiency virus [HIV] disease: Secondary | ICD-10-CM

## 2014-09-12 MED ORDER — EFAVIRENZ-EMTRICITAB-TENOFOVIR 600-200-300 MG PO TABS: 1.0000 | ORAL_TABLET | Freq: Every day | ORAL | Status: DC

## 2014-09-12 NOTE — Telephone Encounter (Signed)
Notified pt rx sent to pharmacy../[facility] 

## 2014-09-12 NOTE — Telephone Encounter (Signed)
Pt called in requesting refill on his efavirenz-emtricitabine-tenofovir (ATRIPLA) 600-200-300 MG per tablet [[id number]]   [facility] that is on file.

## 2014-09-19 ENCOUNTER — Other Ambulatory Visit: Payer: Commercial Managed Care - HMO

## 2014-09-19 DIAGNOSIS — Z113 Encounter for screening for infections with a predominantly sexual mode of transmission: Secondary | ICD-10-CM | POA: Diagnosis not present

## 2014-09-19 DIAGNOSIS — Z79899 Other long term (current) drug therapy: Secondary | ICD-10-CM | POA: Diagnosis not present

## 2014-09-19 DIAGNOSIS — B20 Human immunodeficiency virus [HIV] disease: Secondary | ICD-10-CM | POA: Diagnosis not present

## 2014-09-19 LAB — COMPLETE METABOLIC PANEL WITH GFR
ALT: 15 U/L (ref 0–53)
AST: 27 U/L (ref 0–37)
Albumin: 3.7 g/dL (ref 3.5–5.2)
Alkaline Phosphatase: 188 U/L — ABNORMAL HIGH (ref 39–117)
BILIRUBIN TOTAL: 0.4 mg/dL (ref 0.2–1.2)
BUN: 29 mg/dL — ABNORMAL HIGH (ref 6–23)
CALCIUM: 8.7 mg/dL (ref 8.4–10.5)
CO2: 22 meq/L (ref 19–32)
Chloride: 104 mEq/L (ref 96–112)
Creat: 1.23 mg/dL (ref 0.50–1.35)
GFR, EST AFRICAN AMERICAN: 67 mL/min
GFR, EST NON AFRICAN AMERICAN: 58 mL/min — AB
GLUCOSE: 89 mg/dL (ref 70–99)
Potassium: 3.3 mEq/L — ABNORMAL LOW (ref 3.5–5.3)
SODIUM: 134 meq/L — AB (ref 135–145)
TOTAL PROTEIN: 6.8 g/dL (ref 6.0–8.3)

## 2014-09-19 LAB — CBC WITH DIFFERENTIAL/PLATELET
Basophils Absolute: 0.1 10*3/uL (ref 0.0–0.1)
Basophils Relative: 2 % — ABNORMAL HIGH (ref 0–1)
EOS ABS: 0.2 10*3/uL (ref 0.0–0.7)
Eosinophils Relative: 4 % (ref 0–5)
HEMATOCRIT: 38 % — AB (ref 39.0–52.0)
Hemoglobin: 12.3 g/dL — ABNORMAL LOW (ref 13.0–17.0)
LYMPHS ABS: 0.9 10*3/uL (ref 0.7–4.0)
LYMPHS PCT: 16 % (ref 12–46)
MCH: 28 pg (ref 26.0–34.0)
MCHC: 32.4 g/dL (ref 30.0–36.0)
MCV: 86.6 fL (ref 78.0–100.0)
MONOS PCT: 14 % — AB (ref 3–12)
MPV: 9.9 fL (ref 8.6–12.4)
Monocytes Absolute: 0.8 10*3/uL (ref 0.1–1.0)
NEUTROS PCT: 64 % (ref 43–77)
Neutro Abs: 3.8 10*3/uL (ref 1.7–7.7)
Platelets: 256 10*3/uL (ref 150–400)
RBC: 4.39 MIL/uL (ref 4.22–5.81)
RDW: 14.3 % (ref 11.5–15.5)
WBC: 5.9 10*3/uL (ref 4.0–10.5)

## 2014-09-19 LAB — LIPID PANEL
Cholesterol: 199 mg/dL (ref 0–200)
HDL: 61 mg/dL (ref >=40)
LDL Cholesterol: 119 mg/dL — ABNORMAL HIGH (ref 0–99)
TRIGLYCERIDES: 97 mg/dL (ref <150)
Total CHOL/HDL Ratio: 3.3 Ratio
VLDL: 19 mg/dL (ref 0–40)

## 2014-09-20 LAB — T-HELPER CELL (CD4) - (RCID CLINIC ONLY)
CD4 % Helper T Cell: 24 % — ABNORMAL LOW (ref 33–55)
CD4 T Cell Abs: 900 /uL (ref 400–2700)

## 2014-09-20 LAB — RPR

## 2014-09-20 LAB — HIV-1 RNA QUANT-NO REFLEX-BLD

## 2014-09-25 ENCOUNTER — Other Ambulatory Visit: Payer: Self-pay | Admitting: Internal Medicine

## 2014-09-29 ENCOUNTER — Ambulatory Visit (INDEPENDENT_AMBULATORY_CARE_PROVIDER_SITE_OTHER): Payer: Commercial Managed Care - HMO | Admitting: Internal Medicine

## 2014-09-29 VITALS — BP 91/56 | HR 76 | Temp 99.2°F | Ht 68.0 in | Wt 164.0 lb

## 2014-09-29 DIAGNOSIS — B20 Human immunodeficiency virus [HIV] disease: Secondary | ICD-10-CM | POA: Diagnosis not present

## 2014-09-29 DIAGNOSIS — I10 Essential (primary) hypertension: Secondary | ICD-10-CM

## 2014-09-29 DIAGNOSIS — B182 Chronic viral hepatitis C: Secondary | ICD-10-CM

## 2014-09-29 NOTE — Assessment & Plan Note (Signed)
bp low, asymptomatic with no dizziness, no orthostasis.  PCP to manage.

## 2014-09-29 NOTE — Assessment & Plan Note (Signed)
Followed by hepatology clinic, s/p treatment. F2-3

## 2014-09-29 NOTE — Progress Notes (Signed)
  Subjective:    Patient ID: Billy Brink., male    DOB: April 18, 1941, 74 y.o.   MRN: [id number]  HPI For routine followup.  He continues on Atripla and denies any missed doses. Notinterested in changing.  His CD4 count has been around 300 for months but now is 900.  Not sure if this is real or not.  Continues to have pain in both hips, particularly of left thigh and hip.  Going to see Dr.  Diona Fanti.  Had bone scan showing some loosening.   Not sexually active since his divorce according to him, though has tried to fill Viagra before but could not afford.        Review of Systems  Constitutional: Negative for fever, fatigue and unexpected weight change.  HENT: Negative for sore throat and trouble swallowing.   Eyes: Negative for visual disturbance.  Respiratory: Negative for shortness of breath.   Cardiovascular: Negative for chest pain.  Gastrointestinal: Negative for nausea, abdominal pain and diarrhea.  Musculoskeletal: Negative for myalgias and arthralgias.  Skin: Negative for rash.  Neurological: Negative for headaches.  Hematological: Negative for adenopathy.  Psychiatric/Behavioral: The patient is not nervous/anxious.        Objective:   Physical Exam  Constitutional: He appears well-developed and well-nourished. No distress.  HENT:  Mouth/Throat: No oropharyngeal exudate.  Eyes: No scleral icterus.  Cardiovascular: Normal rate, regular rhythm and normal heart sounds.   No murmur heard. Pulmonary/Chest: Effort normal and breath sounds normal. No respiratory distress.  Lymphadenopathy:    He has no cervical adenopathy.  Skin: No rash noted.  Psychiatric: He has a normal mood and affect. His behavior is normal.          Assessment & Plan:

## 2014-09-29 NOTE — Assessment & Plan Note (Signed)
Doing well, rtc 6 months.  Offered, refused condoms

## 2014-10-04 ENCOUNTER — Emergency Department (HOSPITAL_COMMUNITY): Payer: Commercial Managed Care - HMO

## 2014-10-04 ENCOUNTER — Inpatient Hospital Stay (HOSPITAL_COMMUNITY)
Admission: RE | Admit: 2014-10-04 | Discharge: 2014-10-13 | DRG: 466 | Disposition: A | Discharge status: Skilled Nursing Facility | Payer: Commercial Managed Care - HMO | Attending: Internal Medicine | Admitting: Internal Medicine

## 2014-10-04 ENCOUNTER — Emergency Department (HOSPITAL_BASED_OUTPATIENT_CLINIC_OR_DEPARTMENT_OTHER)
Admit: 2014-10-04 | Discharge: 2014-10-04 | Disposition: A | Discharge status: Home / Self Care | Payer: Commercial Managed Care - HMO | Attending: Emergency Medicine | Admitting: Emergency Medicine

## 2014-10-04 ENCOUNTER — Inpatient Hospital Stay (HOSPITAL_COMMUNITY): Payer: Commercial Managed Care - HMO

## 2014-10-04 ENCOUNTER — Encounter (HOSPITAL_COMMUNITY): Payer: Self-pay | Admitting: Emergency Medicine

## 2014-10-04 DIAGNOSIS — T84041D Periprosthetic fracture around internal prosthetic left hip joint, subsequent encounter: Secondary | ICD-10-CM | POA: Diagnosis not present

## 2014-10-04 DIAGNOSIS — Z8546 Personal history of malignant neoplasm of prostate: Secondary | ICD-10-CM

## 2014-10-04 DIAGNOSIS — J9601 Acute respiratory failure with hypoxia: Secondary | ICD-10-CM | POA: Diagnosis not present

## 2014-10-04 DIAGNOSIS — R55 Syncope and collapse: Secondary | ICD-10-CM

## 2014-10-04 DIAGNOSIS — K219 Gastro-esophageal reflux disease without esophagitis: Secondary | ICD-10-CM | POA: Diagnosis present

## 2014-10-04 DIAGNOSIS — A419 Sepsis, unspecified organism: Secondary | ICD-10-CM | POA: Diagnosis not present

## 2014-10-04 DIAGNOSIS — Z419 Encounter for procedure for purposes other than remedying health state, unspecified: Secondary | ICD-10-CM

## 2014-10-04 DIAGNOSIS — J441 Chronic obstructive pulmonary disease with (acute) exacerbation: Secondary | ICD-10-CM | POA: Diagnosis present

## 2014-10-04 DIAGNOSIS — N189 Chronic kidney disease, unspecified: Secondary | ICD-10-CM | POA: Diagnosis present

## 2014-10-04 DIAGNOSIS — I248 Other forms of acute ischemic heart disease: Secondary | ICD-10-CM | POA: Diagnosis present

## 2014-10-04 DIAGNOSIS — Z809 Family history of malignant neoplasm, unspecified: Secondary | ICD-10-CM

## 2014-10-04 DIAGNOSIS — H9192 Unspecified hearing loss, left ear: Secondary | ICD-10-CM | POA: Diagnosis present

## 2014-10-04 DIAGNOSIS — T84041A Periprosthetic fracture around internal prosthetic left hip joint, initial encounter: Principal | ICD-10-CM

## 2014-10-04 DIAGNOSIS — J90 Pleural effusion, not elsewhere classified: Secondary | ICD-10-CM | POA: Diagnosis not present

## 2014-10-04 DIAGNOSIS — M9702XA Periprosthetic fracture around internal prosthetic left hip joint, initial encounter: Secondary | ICD-10-CM

## 2014-10-04 DIAGNOSIS — I10 Essential (primary) hypertension: Secondary | ICD-10-CM | POA: Diagnosis present

## 2014-10-04 DIAGNOSIS — D62 Acute posthemorrhagic anemia: Secondary | ICD-10-CM | POA: Diagnosis not present

## 2014-10-04 DIAGNOSIS — M96662 Fracture of femur following insertion of orthopedic implant, joint prosthesis, or bone plate, left leg: Secondary | ICD-10-CM | POA: Diagnosis not present

## 2014-10-04 DIAGNOSIS — B182 Chronic viral hepatitis C: Secondary | ICD-10-CM | POA: Diagnosis not present

## 2014-10-04 DIAGNOSIS — Z923 Personal history of irradiation: Secondary | ICD-10-CM

## 2014-10-04 DIAGNOSIS — M7989 Other specified soft tissue disorders: Secondary | ICD-10-CM

## 2014-10-04 DIAGNOSIS — S0990XA Unspecified injury of head, initial encounter: Secondary | ICD-10-CM | POA: Diagnosis not present

## 2014-10-04 DIAGNOSIS — M63852 Disorders of muscle in diseases classified elsewhere, left thigh: Secondary | ICD-10-CM | POA: Diagnosis not present

## 2014-10-04 DIAGNOSIS — Z79899 Other long term (current) drug therapy: Secondary | ICD-10-CM

## 2014-10-04 DIAGNOSIS — B1920 Unspecified viral hepatitis C without hepatic coma: Secondary | ICD-10-CM | POA: Diagnosis present

## 2014-10-04 DIAGNOSIS — IMO0002 Reserved for concepts with insufficient information to code with codable children: Secondary | ICD-10-CM

## 2014-10-04 DIAGNOSIS — R262 Difficulty in walking, not elsewhere classified: Secondary | ICD-10-CM | POA: Diagnosis not present

## 2014-10-04 DIAGNOSIS — N182 Chronic kidney disease, stage 2 (mild): Secondary | ICD-10-CM | POA: Diagnosis present

## 2014-10-04 DIAGNOSIS — Z96642 Presence of left artificial hip joint: Secondary | ICD-10-CM | POA: Diagnosis not present

## 2014-10-04 DIAGNOSIS — M79609 Pain in unspecified limb: Secondary | ICD-10-CM

## 2014-10-04 DIAGNOSIS — R911 Solitary pulmonary nodule: Secondary | ICD-10-CM | POA: Diagnosis not present

## 2014-10-04 DIAGNOSIS — B20 Human immunodeficiency virus [HIV] disease: Secondary | ICD-10-CM | POA: Diagnosis present

## 2014-10-04 DIAGNOSIS — S3993XA Unspecified injury of pelvis, initial encounter: Secondary | ICD-10-CM | POA: Diagnosis not present

## 2014-10-04 DIAGNOSIS — J449 Chronic obstructive pulmonary disease, unspecified: Secondary | ICD-10-CM | POA: Diagnosis not present

## 2014-10-04 DIAGNOSIS — Y831 Surgical operation with implant of artificial internal device as the cause of abnormal reaction of the patient, or of later complication, without mention of misadventure at the time of the procedure: Secondary | ICD-10-CM | POA: Diagnosis present

## 2014-10-04 DIAGNOSIS — M25552 Pain in left hip: Secondary | ICD-10-CM | POA: Diagnosis not present

## 2014-10-04 DIAGNOSIS — S72002A Fracture of unspecified part of neck of left femur, initial encounter for closed fracture: Secondary | ICD-10-CM | POA: Diagnosis not present

## 2014-10-04 DIAGNOSIS — B181 Chronic viral hepatitis B without delta-agent: Secondary | ICD-10-CM | POA: Diagnosis not present

## 2014-10-04 DIAGNOSIS — D638 Anemia in other chronic diseases classified elsewhere: Secondary | ICD-10-CM | POA: Diagnosis present

## 2014-10-04 DIAGNOSIS — W010XXA Fall on same level from slipping, tripping and stumbling without subsequent striking against object, initial encounter: Secondary | ICD-10-CM | POA: Diagnosis present

## 2014-10-04 DIAGNOSIS — I129 Hypertensive chronic kidney disease with stage 1 through stage 4 chronic kidney disease, or unspecified chronic kidney disease: Secondary | ICD-10-CM | POA: Diagnosis present

## 2014-10-04 DIAGNOSIS — T1490 Injury, unspecified: Secondary | ICD-10-CM | POA: Diagnosis not present

## 2014-10-04 DIAGNOSIS — R5082 Postprocedural fever: Secondary | ICD-10-CM | POA: Diagnosis present

## 2014-10-04 DIAGNOSIS — T819XXA Unspecified complication of procedure, initial encounter: Secondary | ICD-10-CM

## 2014-10-04 DIAGNOSIS — Z87891 Personal history of nicotine dependence: Secondary | ICD-10-CM | POA: Diagnosis not present

## 2014-10-04 DIAGNOSIS — N179 Acute kidney failure, unspecified: Secondary | ICD-10-CM | POA: Diagnosis not present

## 2014-10-04 DIAGNOSIS — T84091A Other mechanical complication of internal left hip prosthesis, initial encounter: Secondary | ICD-10-CM | POA: Diagnosis not present

## 2014-10-04 DIAGNOSIS — R229 Localized swelling, mass and lump, unspecified: Secondary | ICD-10-CM

## 2014-10-04 DIAGNOSIS — M25559 Pain in unspecified hip: Secondary | ICD-10-CM | POA: Diagnosis not present

## 2014-10-04 DIAGNOSIS — R509 Fever, unspecified: Secondary | ICD-10-CM

## 2014-10-04 DIAGNOSIS — B1910 Unspecified viral hepatitis B without hepatic coma: Secondary | ICD-10-CM | POA: Diagnosis present

## 2014-10-04 DIAGNOSIS — J9811 Atelectasis: Secondary | ICD-10-CM | POA: Diagnosis not present

## 2014-10-04 DIAGNOSIS — R918 Other nonspecific abnormal finding of lung field: Secondary | ICD-10-CM | POA: Diagnosis not present

## 2014-10-04 DIAGNOSIS — S72009A Fracture of unspecified part of neck of unspecified femur, initial encounter for closed fracture: Secondary | ICD-10-CM

## 2014-10-04 DIAGNOSIS — M21252 Flexion deformity, left hip: Secondary | ICD-10-CM | POA: Diagnosis not present

## 2014-10-04 DIAGNOSIS — Z01818 Encounter for other preprocedural examination: Secondary | ICD-10-CM

## 2014-10-04 DIAGNOSIS — Z471 Aftercare following joint replacement surgery: Secondary | ICD-10-CM | POA: Diagnosis not present

## 2014-10-04 LAB — CBC WITH DIFFERENTIAL/PLATELET
BASOS ABS: 0.1 10*3/uL (ref 0.0–0.1)
BASOS PCT: 1 % (ref 0–1)
Eosinophils Absolute: 0 10*3/uL (ref 0.0–0.7)
Eosinophils Relative: 1 % (ref 0–5)
HEMATOCRIT: 40.4 % (ref 39.0–52.0)
Hemoglobin: 12.9 g/dL — ABNORMAL LOW (ref 13.0–17.0)
Lymphocytes Relative: 15 % (ref 12–46)
Lymphs Abs: 1 10*3/uL (ref 0.7–4.0)
MCH: 28 pg (ref 26.0–34.0)
MCHC: 31.9 g/dL (ref 30.0–36.0)
MCV: 87.6 fL (ref 78.0–100.0)
MONO ABS: 0.8 10*3/uL (ref 0.1–1.0)
MONOS PCT: 13 % — AB (ref 3–12)
Neutro Abs: 4.5 10*3/uL (ref 1.7–7.7)
Neutrophils Relative %: 70 % (ref 43–77)
Platelets: 282 10*3/uL (ref 150–400)
RBC: 4.61 MIL/uL (ref 4.22–5.81)
RDW: 14.8 % (ref 11.5–15.5)
WBC: 6.4 10*3/uL (ref 4.0–10.5)

## 2014-10-04 LAB — BASIC METABOLIC PANEL
Anion gap: 6 (ref 5–15)
BUN: 23 mg/dL — AB (ref 6–20)
CHLORIDE: 112 mmol/L — AB (ref 101–111)
CO2: 20 mmol/L — ABNORMAL LOW (ref 22–32)
Calcium: 9.1 mg/dL (ref 8.9–10.3)
Creatinine, Ser: 1.59 mg/dL — ABNORMAL HIGH (ref 0.61–1.24)
GFR calc Af Amer: 48 mL/min — ABNORMAL LOW (ref >60)
GFR, EST NON AFRICAN AMERICAN: 42 mL/min — AB (ref >60)
GLUCOSE: 119 mg/dL — AB (ref 70–99)
POTASSIUM: 3.8 mmol/L (ref 3.5–5.1)
SODIUM: 138 mmol/L (ref 135–145)

## 2014-10-04 LAB — URINALYSIS, ROUTINE W REFLEX MICROSCOPIC
Bilirubin Urine: NEGATIVE
GLUCOSE, UA: NEGATIVE mg/dL
HGB URINE DIPSTICK: NEGATIVE
Ketones, ur: NEGATIVE mg/dL
Leukocytes, UA: NEGATIVE
Nitrite: NEGATIVE
Protein, ur: 30 mg/dL — AB
SPECIFIC GRAVITY, URINE: 1.019 (ref 1.005–1.030)
UROBILINOGEN UA: 0.2 mg/dL (ref 0.0–1.0)
pH: 6 (ref 5.0–8.0)

## 2014-10-04 LAB — CBC
HCT: 36.1 % — ABNORMAL LOW (ref 39.0–52.0)
HEMOGLOBIN: 11.4 g/dL — AB (ref 13.0–17.0)
MCH: 27.8 pg (ref 26.0–34.0)
MCHC: 31.6 g/dL (ref 30.0–36.0)
MCV: 88 fL (ref 78.0–100.0)
Platelets: 234 10*3/uL (ref 150–400)
RBC: 4.1 MIL/uL — AB (ref 4.22–5.81)
RDW: 14.8 % (ref 11.5–15.5)
WBC: 5.5 10*3/uL (ref 4.0–10.5)

## 2014-10-04 LAB — TROPONIN I: Troponin I: <0.03 ng/mL (ref <0.031)

## 2014-10-04 LAB — URINE MICROSCOPIC-ADD ON

## 2014-10-04 LAB — CREATININE, SERUM
Creatinine, Ser: 1.41 mg/dL — ABNORMAL HIGH (ref 0.61–1.24)
GFR calc Af Amer: 56 mL/min — ABNORMAL LOW (ref >60)
GFR, EST NON AFRICAN AMERICAN: 48 mL/min — AB (ref >60)

## 2014-10-04 MED ORDER — IPRATROPIUM-ALBUTEROL 0.5-2.5 (3) MG/3ML IN SOLN
3.0000 mL | Freq: Four times a day (QID) | RESPIRATORY_TRACT | Status: DC
Start: 2014-10-04 — End: 2014-10-04
  Administered 2014-10-04: 3 mL via RESPIRATORY_TRACT
  Filled 2014-10-04: qty 3

## 2014-10-04 MED ORDER — ENOXAPARIN SODIUM 40 MG/0.4ML ~~LOC~~ SOLN
40.0000 mg | SUBCUTANEOUS | Status: DC
  Administered 2014-10-04 – 2014-10-06 (×3): 40 mg via SUBCUTANEOUS
  Filled 2014-10-04 (×4): qty 0.4

## 2014-10-04 MED ORDER — ONDANSETRON HCL 4 MG/2ML IJ SOLN
4.0000 mg | Freq: Once | INTRAMUSCULAR | Status: DC
  Filled 2014-10-04: qty 2

## 2014-10-04 MED ORDER — HYDROMORPHONE HCL 2 MG PO TABS
2.0000 mg | ORAL_TABLET | ORAL | Status: DC | PRN
  Administered 2014-10-04 – 2014-10-07 (×3): 2 mg via ORAL
  Filled 2014-10-04 (×3): qty 1

## 2014-10-04 MED ORDER — HEPARIN SODIUM (PORCINE) 5000 UNIT/ML IJ SOLN
5000.0000 [IU] | Freq: Three times a day (TID) | INTRAMUSCULAR | Status: DC
  Filled 2014-10-04 (×2): qty 1

## 2014-10-04 MED ORDER — HYDROCODONE-ACETAMINOPHEN 5-325 MG PO TABS
1.0000 | ORAL_TABLET | Freq: Four times a day (QID) | ORAL | Status: DC | PRN
  Administered 2014-10-04 – 2014-10-08 (×11): 2 via ORAL
  Filled 2014-10-04 (×12): qty 2

## 2014-10-04 MED ORDER — MORPHINE SULFATE 2 MG/ML IJ SOLN: 0.5000 mg | INTRAMUSCULAR | Status: DC | PRN

## 2014-10-04 MED ORDER — FLEET ENEMA 7-19 GM/118ML RE ENEM: 1.0000 | ENEMA | Freq: Once | RECTAL | Status: DC | PRN

## 2014-10-04 MED ORDER — ENSURE ENLIVE PO LIQD
237.0000 mL | Freq: Two times a day (BID) | ORAL | Status: DC
  Administered 2014-10-05 – 2014-10-13 (×15): 237 mL via ORAL

## 2014-10-04 MED ORDER — IOHEXOL 300 MG/ML  SOLN
100.0000 mL | Freq: Once | INTRAMUSCULAR | Status: AC | PRN
Start: 2014-10-04 — End: 2014-10-04
  Administered 2014-10-04: 100 mL via INTRAVENOUS

## 2014-10-04 MED ORDER — VITAMIN A 10000 UNITS PO CAPS
10000.0000 [IU] | ORAL_CAPSULE | Freq: Every evening | ORAL | Status: DC
  Administered 2014-10-04 – 2014-10-12 (×8): 10000 [IU] via ORAL
  Filled 2014-10-04 (×10): qty 1

## 2014-10-04 MED ORDER — BISACODYL 5 MG PO TBEC: 5.0000 mg | DELAYED_RELEASE_TABLET | Freq: Every day | ORAL | Status: DC | PRN

## 2014-10-04 MED ORDER — TAMSULOSIN HCL 0.4 MG PO CAPS
0.4000 mg | ORAL_CAPSULE | Freq: Two times a day (BID) | ORAL | Status: DC
  Administered 2014-10-04 – 2014-10-13 (×17): 0.4 mg via ORAL
  Filled 2014-10-04 (×19): qty 1

## 2014-10-04 MED ORDER — EFAVIRENZ-EMTRICITAB-TENOFOVIR 600-200-300 MG PO TABS
1.0000 | ORAL_TABLET | Freq: Every day | ORAL | Status: DC
  Administered 2014-10-05 – 2014-10-12 (×8): 1 via ORAL
  Filled 2014-10-04 (×10): qty 1

## 2014-10-04 MED ORDER — HYDROMORPHONE HCL 1 MG/ML IJ SOLN
0.5000 mg | INTRAMUSCULAR | Status: DC | PRN
  Administered 2014-10-08: 0.5 mg via INTRAVENOUS
  Filled 2014-10-04: qty 1

## 2014-10-04 MED ORDER — IPRATROPIUM-ALBUTEROL 0.5-2.5 (3) MG/3ML IN SOLN: 3.0000 mL | Freq: Four times a day (QID) | RESPIRATORY_TRACT | Status: DC | PRN

## 2014-10-04 MED ORDER — ONDANSETRON 4 MG PO TBDP
4.0000 mg | ORAL_TABLET | Freq: Once | ORAL | Status: AC
  Administered 2014-10-04: 4 mg via ORAL
  Filled 2014-10-04: qty 1

## 2014-10-04 MED ORDER — VITAMIN C 500 MG PO TABS
500.0000 mg | ORAL_TABLET | Freq: Every evening | ORAL | Status: DC
  Administered 2014-10-04 – 2014-10-12 (×9): 500 mg via ORAL
  Filled 2014-10-04 (×9): qty 1

## 2014-10-04 MED ORDER — SODIUM CHLORIDE 0.9 % IV SOLN
INTRAVENOUS | Status: AC
  Administered 2014-10-04 – 2014-10-05 (×2): via INTRAVENOUS

## 2014-10-04 MED ORDER — VITAMIN D 1000 UNITS PO TABS
1000.0000 [IU] | ORAL_TABLET | Freq: Every evening | ORAL | Status: DC
  Administered 2014-10-04 – 2014-10-12 (×9): 1000 [IU] via ORAL
  Filled 2014-10-04 (×9): qty 1

## 2014-10-04 MED ORDER — FLEET ENEMA 7-19 GM/118ML RE ENEM: 1.0000 | ENEMA | Freq: Once | RECTAL | Status: AC | PRN

## 2014-10-04 MED ORDER — POLYETHYLENE GLYCOL 3350 17 G PO PACK: 17.0000 g | PACK | Freq: Every day | ORAL | Status: DC | PRN

## 2014-10-04 MED ORDER — SENNOSIDES-DOCUSATE SODIUM 8.6-50 MG PO TABS: 1.0000 | ORAL_TABLET | Freq: Every evening | ORAL | Status: DC | PRN

## 2014-10-04 MED ORDER — HYDROCODONE-ACETAMINOPHEN 5-325 MG PO TABS: 1.0000 | ORAL_TABLET | Freq: Four times a day (QID) | ORAL | Status: DC | PRN

## 2014-10-04 MED ORDER — SODIUM CHLORIDE 0.9 % IV BOLUS (SEPSIS)
500.0000 mL | Freq: Once | INTRAVENOUS | Status: AC
  Administered 2014-10-04: 500 mL via INTRAVENOUS

## 2014-10-04 NOTE — Progress Notes (Signed)
Patient denies history of COPD. Reluctant to use hand held nebulizer and BD. Prefers not using BD unless he is having difficulty breathing. Expressed fears of inhalation of substance causing "asthma". Education provided. Agreed to BD x 1. RT treatment assessment protocol completed. Orders changed accordingly.

## 2014-10-04 NOTE — H&P (Signed)
Patient Demographics  Billy Lopez, is a 74 y.o. male  MRN: [id number]   DOB - 1941-02-09  Admit Date - 10/04/2014  Outpatient Primary MD for the patient is [name], MD   With History of -  Past Medical History  Diagnosis Date  . Hypertension   . HIV infection   . Substance abuse   . 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 Allergic rhinitis, cause unspecified 07/21/2011  . Cervical spondylosis 07/21/2011  . Lumbar spondylosis 07/21/2011  . Osteoarthritis 07/21/2011  . COPD (chronic obstructive pulmonary disease) 07/21/2011  . 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 Hx of radiation therapy 12/17/11 - 02/13/12    prostate  . Prostate cancer 09/19/11    gleason 7  . H/O blood clots   . Pneumonia   . GERD (gastroesophageal reflux disease)   . Hepatitis     Hep C  . Rectal bleeding   . Wears dentures     top  . Wears glasses       Past Surgical History  Procedure Laterality Date  . Hip surgery      total bilateral replacement  . Hernia repair    . Hand surgery      right thumb  . Foot surgery      bilat. ingrown toenails removed  . Colonoscopy N/A 11/17/2012    Procedure: COLONOSCOPY;  Surgeon: Ladene Artist, MD;  Location: [facility];  Service: Endoscopy;  Laterality: N/A;  possible APC  . Amputation Right 03/31/2014    Procedure: RIGHT SECOND TOE AMPUTATION ;  Surgeon: Wylene Simmer, MD;  Location: [facility];  Service: Orthopedics;  Laterality: Right;    in for   Chief Complaint  Patient presents with  . Fall  . Leg Swelling     HPI  Billy Lopez  is a 74 y.o. male, with past medical history of hypertension, COPD, HIV, hepatitis C, hepatitis B, who presents to ED for evaluation of fall, patient reports he was reach something on the shelf, where he slipped and fell, he reports he thinks he lost some consciousness after the fall, but he remembers falling, denies any dizziness or lightheadedness prior to full, patient reports significant left hip pain after  fall, reports this fall in general has been going for the last 2 months with mass protruding from left hip, which limited his ambulation, reports he was using cane, then a walker recently, CT hip negative for mass, but showing left hip arthroplasty broken through the lateral cortex of the femur with the tip protruding into the lateral compartment, patient denies any chest pain, shortness of breath, reports a prior to his hip pain he was ambulating for couple blocks with no shortness of breath. Workup in ED was significant for elevated creatinine at 1.59  Review of Systems    In addition to the HPI above,  No Fever-chills, No Headache, No changes with Vision or hearing, No problems swallowing food or Liquids, No Chest pain, Cough or Shortness of Breath, No Abdominal pain, No Nausea or Vommitting, Bowel movements are regular, No Blood in stool or Urine, No dysuria, No new skin rashes or bruises, Complains of  of left hip pain No new weakness, tingling, numbness in any extremity, No recent weight gain or loss, No polyuria, polydypsia or polyphagia, No significant Mental Stressors.  A full 10 point Review of Systems was done, except as stated above, all other Review of Systems were negative.   Social History History  Substance Use Topics  . Smoking status: Former Smoker -- 1.00 packs/day for 10 years    Types: Cigarettes    Quit date: 10/13/1960  . Smokeless tobacco: Never Used  . Alcohol Use: No     Family History Family History  Problem Relation Age of Onset  . Alcohol abuse Other   . Arthritis Other   . Hypertension Other   . Colon cancer Neg Hx   . Diabetes Neg Hx   . Cancer Mother     unsure of type     Prior to Admission medications   Medication Sig Start Date End Date Taking? Authorizing Provider  cholecalciferol (VITAMIN D) 1000 UNITS tablet Take 1,000 Units by mouth every evening.   Yes Historical Provider, MD  efavirenz-emtricitabine-tenofovir (ATRIPLA)  600-200-300 MG per tablet Take 1 tablet by mouth at bedtime. 09/12/14  Yes Biagio Borg, MD  enalapril (VASOTEC) 20 MG tablet Take 1 tablet (20 mg total) by mouth every morning. 09/21/13  Yes Carlyle Basques, MD  feeding supplement, ENSURE COMPLETE, (ENSURE COMPLETE) LIQD Take 237 mLs by mouth 2 (two) times daily between meals. 04/19/14  Yes Biagio Borg, MD  hydrochlorothiazide (HYDRODIURIL) 25 MG tablet take 1 tablet by mouth once daily 09/26/14  Yes Biagio Borg, MD  naproxen (NAPROSYN) 500 MG tablet take 1 tablet by mouth twice a day with food 07/05/14  Yes Biagio Borg, MD  tamsulosin [facility]) 0.4 MG CAPS take 1 capsule by mouth twice a day 10/01/12  Yes Franchot Gallo, MD  traMADol (ULTRAM) 50 MG tablet take 1 tablet by mouth every 6 hours if needed Patient taking differently: Take 50 mg by mouth every 6 (six) hours as needed for moderate pain.  04/19/14  Yes Biagio Borg, MD  vitamin A 10000 UNIT capsule Take 10,000 Units by mouth every evening.   Yes Historical Provider, MD  vitamin C (ASCORBIC ACID) 500 MG tablet Take 500 mg by mouth every evening.   Yes Historical Provider, MD    No Known Allergies  Physical Exam  Vitals  Blood pressure 110/78, pulse 61, temperature 98.1 F (36.7 C), temperature source Oral, resp. rate 17, SpO2 97 %.   1. General moderately nourished male lying in bed in NAD,   2. Normal affect and insight, Not Suicidal or Homicidal, Awake Alert, Oriented X 3.  3. No F.N deficits, ALL C.Nerves Intact, Strength 5/5 all 4 extremities, Sensation intact all 4 extremities, Plantars down going.  4. Ears and Eyes appear Normal, Conjunctivae clear, PERRLA. Moist Oral Mucosa.  5. Supple Neck, No JVD, No cervical lymphadenopathy appriciated, No Carotid Bruits.  6. Symmetrical Chest wall movement, Good air movement bilaterally, CTAB.  7. RRR, No Gallops, Rubs or Murmurs, No Parasternal Heave.  8. Positive Bowel Sounds, Abdomen Soft, No tenderness, No organomegaly  appriciated,No rebound -guarding or rigidity.  9.  No Cyanosis, Normal Skin Turgor, No Skin Rash or Bruise.  10. Good muscle tone, left lower extremity appears to be shorter than right, with painful range of motion at the thigh area, with mass overlying lateral left thigh.  11. No Palpable Lymph Nodes in Neck or Axillae    Data Review  CBC  Recent Labs Lab 10/04/14 1046  WBC 6.4  HGB 12.9*  HCT 40.4  PLT 282  MCV 87.6  MCH 28.0  MCHC 31.9  RDW 14.8  LYMPHSABS 1.0  MONOABS 0.8  EOSABS 0.0  BASOSABS 0.1   ------------------------------------------------------------------------------------------------------------------  Chemistries   Recent Labs Lab  10/04/14 1046  NA 138  K 3.8  CL 112*  CO2 20*  GLUCOSE 119*  BUN 23*  CREATININE 1.59*  CALCIUM 9.1   ------------------------------------------------------------------------------------------------------------------ estimated creatinine clearance is 40.6 mL/min (by C-G formula based on Cr of 1.59). ------------------------------------------------------------------------------------------------------------------ No results for input(s): TSH, T4TOTAL, T3FREE, THYROIDAB in the last 72 hours.  Invalid input(s): FREET3   Coagulation profile No results for input(s): INR, PROTIME in the last 168 hours. ------------------------------------------------------------------------------------------------------------------- No results for input(s): DDIMER in the last 72 hours. -------------------------------------------------------------------------------------------------------------------  Cardiac Enzymes  Recent Labs Lab 10/04/14 1046  TROPONINI <0.03   ------------------------------------------------------------------------------------------------------------------ Invalid input(s):  POCBNP   ---------------------------------------------------------------------------------------------------------------  Urinalysis    Component Value Date/Time   COLORURINE YELLOW 10/04/2014 1050   APPEARANCEUR CLOUDY* 10/04/2014 1050   LABSPEC 1.019 10/04/2014 1050   PHURINE 6.0 10/04/2014 1050   GLUCOSEU NEGATIVE 10/04/2014 1050   GLUCOSEU NEGATIVE 04/19/2014 1216   HGBUR NEGATIVE 10/04/2014 1050   BILIRUBINUR NEGATIVE 10/04/2014 1050   KETONESUR NEGATIVE 10/04/2014 1050   PROTEINUR 30* 10/04/2014 1050   UROBILINOGEN 0.2 10/04/2014 1050   NITRITE NEGATIVE 10/04/2014 1050   LEUKOCYTESUR NEGATIVE 10/04/2014 1050    ----------------------------------------------------------------------------------------------------------------  Imaging results:   Ct Head Wo Contrast  10/04/2014   CLINICAL DATA:  Fall, hit head today  EXAM: CT HEAD WITHOUT CONTRAST  TECHNIQUE: Contiguous axial images were obtained from the base of the skull through the vertex without intravenous contrast.  COMPARISON:  06/23/2010  FINDINGS: No skull fracture is noted. Paranasal sinuses and mastoid air cells are unremarkable. No intracranial hemorrhage, mass effect or midline shift. No acute infarction. No mass lesion is noted on this unenhanced scan. The gray and white-matter differentiation is preserved. Minimal cerebral atrophy.  IMPRESSION: No acute intracranial abnormality.  No significant change.   Electronically Signed   By: Lahoma Crocker M.D.   On: 10/04/2014 11:47   Ct Femur Left W Contrast  10/04/2014   CLINICAL DATA:  Left thigh swelling for 1 month. The patient is unable to bear weight. Question mass.  EXAM: CT OF THE LEFT FEMUR WITH CONTRAST  TECHNIQUE: Contiguous axial CT images are taken through the left upper leg after the administration of contrast material.  CONTRAST:  100 mL OMNIPAQUE IOHEXOL 300 MG/ML  SOLN  COMPARISON:  None.  FINDINGS: The patient has a left total hip arthroplasty. The femoral component  has broken through the lateral cortex of the femur and is angulated with the tip protruding into the lateral compartment musculature. This accounts for the patient's possible mass. No soft tissue mass is identified. The acetabular component of the patient's left hip arthroplasty is unremarkable. Imaged musculature demonstrates mild atrophy of the scratch there is mild atrophy of the gluteal musculature. Imaged musculature is otherwise unremarkable. Degenerative disease about the left knee is noted.  IMPRESSION: Negative for mass lesion. The femoral component of the patient's left hip arthroplasty has broken through the lateral cortex of the femur with the tip protruding into the lateral compartment musculature accounting for the patient's possible mass.   Electronically Signed   By: Inge Rise M.D.   On: 10/04/2014 14:34    My personal review of EKG: Rhythm NSR, Rate 73 /min, QTc 439 , no Acute ST changes, old T wave inversion in V5 and V6.    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 - Patient has no acute EKG changes, denies shortness of breath, denies any chest pain, had recent foot surgery in 10/15 under general anesthesia, patient is moderate risk for surgical intervention, no further workup needed prior to surgery. - We'll start on DuoNeb every 6 hours giving his history of COPD.  Acute renal failure - Presents with mild elevation of creatinine, will hold hydrochlorothiazide and enalapril, will hydrate gently, recheck BMP in a.m..  HIV - Continue with home medication  Chronic hep C and hep B - Continue with home  medication  Essential hypertension - Blood pressure on the lower side, will hold enalapril and hydrochlorothiazide especially in the setting of acute renal failure.    DVT Prophylaxis   Lovenox -   AM Labs Ordered, also please review Full Orders  Family Communication: Admission, patients condition and plan of care including tests being ordered have been discussed with the patien who indicate understanding and agree with the plan and Code Status.  Code Status Full  Likely DC to  pending further workup  Condition GUARDED    Time spent in minutes : 55 minutes    Jamani Bearce M.D on 10/04/2014 at 5:18 PM  Between 7am to 7pm - Pager - [phone]  After 7pm go to [email or web address] - password [email or web address]  And look for the night coverage person covering me after hours  [facility] Office  [phone]   **Disclaimer: This note may have been dictated with voice recognition software. Similar sounding words can inadvertently be transcribed and this note may contain transcription errors which may not have been corrected upon publication of note.**

## 2014-10-04 NOTE — Consult Note (Signed)
Reason for Consult:Left Hip Pain Referring Physician: DR. Blair Promise Billy Lopez. is an 74 y.o. male.  HPI: Patient has had Lopez fall about Lopez week ago and again today and has been complaining of Left Hip Pain.He is HIV Positive and informed me that he has had Lopez right Total Hip in [facility] seven years ago and Lopez Left Total Hip at [facility] around seven years ago and does not know the name of his surgeons.He is being admitted by the Hospitalist.  Past Medical History  Diagnosis Date  . Hypertension   . HIV infection   . Substance abuse   . 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 Allergic rhinitis, cause unspecified 07/21/2011  . Cervical spondylosis 07/21/2011  . Lumbar spondylosis 07/21/2011  . Osteoarthritis 07/21/2011  . COPD (chronic obstructive pulmonary disease) 07/21/2011  . 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 Hx of radiation therapy 12/17/11 - 02/13/12    prostate  . Prostate cancer 09/19/11    gleason 7  . H/O blood clots   . Pneumonia   . GERD (gastroesophageal reflux disease)   . Hepatitis     Hep C  . Rectal bleeding   . Wears dentures     top  . Wears glasses     Past Surgical History  Procedure Laterality Date  . Hip surgery      total bilateral replacement  . Hernia repair    . Hand surgery      right thumb  . Foot surgery      bilat. ingrown toenails removed  . Colonoscopy N/Lopez 11/17/2012    Procedure: COLONOSCOPY;  Surgeon: Ladene Artist, MD;  Location: [facility];  Service: Endoscopy;  Laterality: N/Lopez;  possible APC  . Amputation Right 03/31/2014    Procedure: RIGHT SECOND TOE AMPUTATION ;  Surgeon: Wylene Simmer, MD;  Location: [facility];  Service: Orthopedics;  Laterality: Right;    Family History  Problem Relation Age of Onset  . Alcohol abuse Other   . Arthritis Other   . Hypertension Other   . Colon cancer Neg Hx   . Diabetes Neg Hx   . Cancer Mother     unsure of type    Social History:  reports that he quit smoking about 54 years ago.  His smoking use included Cigarettes. He has Lopez 10 pack-year smoking history. He has never used smokeless tobacco. He reports that he does not drink alcohol or use illicit drugs.  Allergies: No Known Allergies  Medications: I have reviewed the patient's current medications.  Results for orders placed or performed during the hospital encounter of 10/04/14 (from the past 48 hour(s))  CBC with Differential/Platelet     Status: Abnormal   Collection Time: 10/04/14 10:46 AM  Result Value Ref Range   WBC 6.4 4.0 - 10.5 K/uL   RBC 4.61 4.22 - 5.81 MIL/uL   Hemoglobin 12.9 (L) 13.0 - 17.0 g/dL   HCT 40.4 39.0 - 52.0 %   MCV 87.6 78.0 - 100.0 fL   MCH 28.0 26.0 - 34.0 pg   MCHC 31.9 30.0 - 36.0 g/dL   RDW 14.8 11.5 - 15.5 %   Platelets 282 150 - 400 K/uL   Neutrophils Relative % 70 43 - 77 %   Neutro Abs 4.5 1.7 - 7.7 K/uL   Lymphocytes Relative 15 12 - 46 %   Lymphs Abs 1.0 0.7 - 4.0 K/uL   Monocytes Relative 13 (H) 3 - 12 %  Monocytes Absolute 0.8 0.1 - 1.0 K/uL   Eosinophils Relative 1 0 - 5 %   Eosinophils Absolute 0.0 0.0 - 0.7 K/uL   Basophils Relative 1 0 - 1 %   Basophils Absolute 0.1 0.0 - 0.1 K/uL  Basic metabolic panel     Status: Abnormal   Collection Time: 10/04/14 10:46 AM  Result Value Ref Range   Sodium 138 135 - 145 mmol/L   Potassium 3.8 3.5 - 5.1 mmol/L   Chloride 112 (H) 101 - 111 mmol/L   CO2 20 (L) 22 - 32 mmol/L   Glucose, Bld 119 (H) 70 - 99 mg/dL   BUN 23 (H) 6 - 20 mg/dL   Creatinine, Ser 1.59 (H) 0.61 - 1.24 mg/dL   Calcium 9.1 8.9 - 10.3 mg/dL   GFR calc non Af Amer 42 (L) >60 mL/min   GFR calc Af Amer 48 (L) >60 mL/min    Comment: (NOTE) The eGFR has been calculated using the CKD EPI equation. This calculation has not been validated in all clinical situations. eGFR's persistently <90 mL/min signify possible Chronic Kidney Disease.    Anion gap 6 5 - 15  Troponin I     Status: None   Collection Time: 10/04/14 10:46 AM  Result Value Ref Range    Troponin I <0.03 <0.031 ng/mL    Comment:        NO INDICATION OF MYOCARDIAL INJURY.   Urinalysis, Routine w reflex microscopic     Status: Abnormal   Collection Time: 10/04/14 10:50 AM  Result Value Ref Range   Color, Urine YELLOW YELLOW   APPearance CLOUDY (Lopez) CLEAR   Specific Gravity, Urine 1.019 1.005 - 1.030   pH 6.0 5.0 - 8.0   Glucose, UA NEGATIVE NEGATIVE mg/dL   Hgb urine dipstick NEGATIVE NEGATIVE   Bilirubin Urine NEGATIVE NEGATIVE   Ketones, ur NEGATIVE NEGATIVE mg/dL   Protein, ur 30 (Lopez) NEGATIVE mg/dL   Urobilinogen, UA 0.2 0.0 - 1.0 mg/dL   Nitrite NEGATIVE NEGATIVE   Leukocytes, UA NEGATIVE NEGATIVE  Urine microscopic-add on     Status: Abnormal   Collection Time: 10/04/14 10:50 AM  Result Value Ref Range   Squamous Epithelial / LPF FEW (Lopez) RARE   WBC, UA 0-2 <3 WBC/hpf   RBC / HPF 3-6 <3 RBC/hpf   Bacteria, UA MANY (Lopez) RARE   Casts HYALINE CASTS (Lopez) NEGATIVE    Comment: GRANULAR CAST    Ct Head Wo Contrast  10/04/2014   CLINICAL DATA:  Fall, hit head today  EXAM: CT HEAD WITHOUT CONTRAST  TECHNIQUE: Contiguous axial images were obtained from the base of the skull through the vertex without intravenous contrast.  COMPARISON:  06/23/2010  FINDINGS: No skull fracture is noted. Paranasal sinuses and mastoid air cells are unremarkable. No intracranial hemorrhage, mass effect or midline shift. No acute infarction. No mass lesion is noted on this unenhanced scan. The gray and white-matter differentiation is preserved. Minimal cerebral atrophy.  IMPRESSION: No acute intracranial abnormality.  No significant change.   Electronically Signed   By: Liviu  Pop M.D.   On: 10/04/2014 11:47   Ct Femur Left W Contrast  10/04/2014   CLINICAL DATA:  Left thigh swelling for 1 month. The patient is unable to bear weight. Question mass.  EXAM: CT OF THE LEFT FEMUR WITH CONTRAST  TECHNIQUE: Contiguous axial CT images are taken through the left upper leg after the administration of contrast  material.  CONTRAST:  100 mL   OMNIPAQUE IOHEXOL 300 MG/ML  SOLN  COMPARISON:  None.  FINDINGS: The patient has Lopez left total hip arthroplasty. The femoral component has broken through the lateral cortex of the femur and is angulated with the tip protruding into the lateral compartment musculature. This accounts for the patient's possible mass. No soft tissue mass is identified. The acetabular component of the patient's left hip arthroplasty is unremarkable. Imaged musculature demonstrates mild atrophy of the scratch there is mild atrophy of the gluteal musculature. Imaged musculature is otherwise unremarkable. Degenerative disease about the left knee is noted.  IMPRESSION: Negative for mass lesion. The femoral component of the patient's left hip arthroplasty has broken through the lateral cortex of the femur with the tip protruding into the lateral compartment musculature accounting for the patient's possible mass.   Electronically Signed   By: Inge Rise M.D.   On: 10/04/2014 14:34    Review of Systems  Constitutional: Negative.   HENT: Negative.   Eyes: Negative.   Respiratory: Negative.   Cardiovascular: Negative.   Gastrointestinal: Negative.   Genitourinary: Negative.   Musculoskeletal: Positive for joint pain and falls.  Skin: Negative.   Neurological: Negative.   Endo/Heme/Allergies: Negative.   Psychiatric/Behavioral: Negative.    Blood pressure 110/78, pulse 61, temperature 98.1 F (36.7 C), temperature source Oral, resp. rate 17, SpO2 97 %. Physical Exam  Constitutional: He appears well-developed.  HENT:  Head: Normocephalic.  Eyes: Pupils are equal, round, and reactive to light.  Neck: Normal range of motion.  Cardiovascular: Normal rate.   Respiratory: Effort normal.  GI: Soft.  Musculoskeletal:  Mass over Lateral Left thigh that is painful to palpation. He has Lopez Painful Range of Moyion of Left Hip and shortening of his Left lower. Also has Lopez Right Total Hip by history.   Neurological: He is alert.  Skin: Skin is warm.  Psychiatric: He has Lopez normal mood and affect.    Assessment/Plan: Admit by Hospitalist for evaluation for Surgery. Will keep at bed rest and start Lovenox.  Billy Lopez 10/04/2014, 4:35 PM

## 2014-10-04 NOTE — Progress Notes (Signed)
ANTICOAGULATION CONSULT NOTE - Initial Consult  Pharmacy Consult for Enoxaparin Indication: VTE prophylaxis  No Known Allergies  Patient Measurements: Height: 68 inches Weight: 74.4 kg  Vital Signs: Temp: 98.1 F (36.7 C) (05/03 1518) Temp Source: Oral (05/03 1518) BP: 110/78 mmHg (05/03 1518) Pulse Rate: 61 (05/03 1518)  Labs:  Recent Labs  10/04/14 1046  HGB 12.9*  HCT 40.4  PLT 282  CREATININE 1.59*  TROPONINI <0.03    Estimated Creatinine Clearance: 40.6 mL/min (by C-G formula based on Cr of 1.59).   Medical History: Past Medical History  Diagnosis Date  . Hypertension   . HIV infection   . Substance abuse   . 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 Allergic rhinitis, cause unspecified 07/21/2011  . Cervical spondylosis 07/21/2011  . Lumbar spondylosis 07/21/2011  . Osteoarthritis 07/21/2011  . COPD (chronic obstructive pulmonary disease) 07/21/2011  . 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 Hx of radiation therapy 12/17/11 - 02/13/12    prostate  . Prostate cancer 09/19/11    gleason 7  . H/O blood clots   . Pneumonia   . GERD (gastroesophageal reflux disease)   . Hepatitis     Hep C  . Rectal bleeding   . Wears dentures     top  . Wears glasses     Medications:  Scheduled:  . cholecalciferol  1,000 Units Oral QPM  . efavirenz-emtricitabine-tenofovir  1 tablet Oral QHS  . enoxaparin (LOVENOX) injection  40 mg Subcutaneous Q24H  . [START ON 10/05/2014] feeding supplement (ENSURE ENLIVE)  237 mL Oral BID BM  . tamsulosin  0.4 mg Oral BID  . vitamin A  10,000 Units Oral QPM  . vitamin C  500 mg Oral QPM   Infusions:    Assessment:  74 yr male s/p fall 1 week ago and again today.  Past surgical history include right THA and left THA.  Patient to be evaluated for surgery  Pharmacy consulted to dose Enoxaparin for VTE prophylaxis  CrCl 40.6 ml/min  Goal of Therapy:  Monitor platelets by anticoagulation protocol: Yes   Plan:  Enoxaparin 40mg  sq  q24h  Phala Schraeder, Toribio Harbour, PharmD 10/04/2014,5:09 PM

## 2014-10-04 NOTE — ED Provider Notes (Signed)
CSN: [id number]     Arrival date & time 10/04/14  1007 History   First MD Initiated Contact with Patient 10/04/14 1014     Chief Complaint  Patient presents with  . Fall  . Leg Swelling     (Consider location/radiation/quality/duration/timing/severity/associated sxs/prior Treatment) HPI Comments: Patient presents to the ER for evaluation after a fall. Patient reports that he has been experiencing pain and swelling of his left leg for several months. He has been seen by his primary doctor for this. He reports that he uses a wheelchair and a walker to get around his house normally.  Patient reports that he was trying to reach something on a top shelf today when he slipped and fell. He thinks he lost consciousness. He does remember falling, however, did so he does not think that he passed out prior to the fall. He does report waking up on the floor. He activated his medical alert and was brought to the emergency department. Patient reports that he is aching all over. He denies chest pain, palpitations, shortness of breath.   Past Medical History  Diagnosis Date  . Hypertension   . HIV infection   . Substance abuse   . 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 Allergic rhinitis, cause unspecified 07/21/2011  . Cervical spondylosis 07/21/2011  . Lumbar spondylosis 07/21/2011  . Osteoarthritis 07/21/2011  . COPD (chronic obstructive pulmonary disease) 07/21/2011  . 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 Hx of radiation therapy 12/17/11 - 02/13/12    prostate  . Prostate cancer 09/19/11    gleason 7  . H/O blood clots   . Pneumonia   . GERD (gastroesophageal reflux disease)   . Hepatitis     Hep C  . Rectal bleeding   . Wears dentures     top  . Wears glasses    Past Surgical History  Procedure Laterality Date  . Hip surgery      total bilateral replacement  . Hernia repair    . Hand surgery      right thumb  . Foot surgery      bilat. ingrown toenails removed  . Colonoscopy N/A 11/17/2012     Procedure: COLONOSCOPY;  Surgeon: Ladene Artist, MD;  Location: [facility];  Service: Endoscopy;  Laterality: N/A;  possible APC  . Amputation Right 03/31/2014    Procedure: RIGHT SECOND TOE AMPUTATION ;  Surgeon: Wylene Simmer, MD;  Location: [place];  Service: Orthopedics;  Laterality: Right;   Family History  Problem Relation Age of Onset  . Alcohol abuse Other   . Arthritis Other   . Hypertension Other   . Colon cancer Neg Hx   . Diabetes Neg Hx   . Cancer Mother     unsure of type   History  Substance Use Topics  . Smoking status: Former Smoker -- 1.00 packs/day for 10 years    Types: Cigarettes    Quit date: 10/13/1960  . Smokeless tobacco: Never Used  . Alcohol Use: No    Review of Systems  Musculoskeletal: Positive for arthralgias.  Neurological: Positive for syncope.  All other systems reviewed and are negative.     Allergies  Review of patient's allergies indicates no known allergies.  Home Medications   Prior to Admission medications   Medication Sig Start Date End Date Taking? Authorizing Provider  cholecalciferol (VITAMIN D) 1000 UNITS tablet Take 1,000 Units by mouth every evening.   Yes Historical Provider, MD  efavirenz-emtricitabine-tenofovir ([place]) 600-200-300  MG per tablet Take 1 tablet by mouth at bedtime. 09/12/14  Yes Biagio Borg, MD  enalapril (VASOTEC) 20 MG tablet Take 1 tablet (20 mg total) by mouth every morning. 09/21/13  Yes Carlyle Basques, MD  feeding supplement, ENSURE COMPLETE, (ENSURE COMPLETE) LIQD Take 237 mLs by mouth 2 (two) times daily between meals. 04/19/14  Yes Biagio Borg, MD  hydrochlorothiazide (HYDRODIURIL) 25 MG tablet take 1 tablet by mouth once daily 09/26/14  Yes Biagio Borg, MD  naproxen (NAPROSYN) 500 MG tablet take 1 tablet by mouth twice a day with food 07/05/14  Yes Biagio Borg, MD  tamsulosin [facility]) 0.4 MG CAPS take 1 capsule by mouth twice a day 10/01/12  Yes Franchot Gallo, MD  traMADol  (ULTRAM) 50 MG tablet take 1 tablet by mouth every 6 hours if needed Patient taking differently: Take 50 mg by mouth every 6 (six) hours as needed for moderate pain.  04/19/14  Yes Biagio Borg, MD  vitamin A 10000 UNIT capsule Take 10,000 Units by mouth every evening.   Yes Historical Provider, MD  vitamin C (ASCORBIC ACID) 500 MG tablet Take 500 mg by mouth every evening.   Yes Historical Provider, MD   BP 101/66 mmHg  Pulse 58  Temp(Src) 99.2 F (37.3 C) (Oral)  Resp 18  Ht 5\' 8"  (1.727 m)  Wt 164 lb (74.39 kg)  BMI 24.94 kg/m2  SpO2 98% Physical Exam  Constitutional: He is oriented to person, place, and time. He appears well-developed and well-nourished. No distress.  HENT:  Head: Normocephalic and atraumatic.  Right Ear: Hearing normal.  Left Ear: Hearing normal.  Nose: Nose normal.  Mouth/Throat: Oropharynx is clear and moist and mucous membranes are normal.  Eyes: Conjunctivae and EOM are normal. Pupils are equal, round, and reactive to light.  Neck: Normal range of motion. Neck supple.  Cardiovascular: Regular rhythm, S1 normal and S2 normal.  Exam reveals no gallop and no friction rub.   No murmur heard. Pulmonary/Chest: Effort normal and breath sounds normal. No respiratory distress. He exhibits no tenderness.  Abdominal: Soft. Normal appearance and bowel sounds are normal. There is no hepatosplenomegaly. There is no tenderness. There is no rebound, no guarding, no tenderness at McBurney's point and negative Murphy's sign. No hernia.  Musculoskeletal: Normal range of motion.  Neurological: He is alert and oriented to person, place, and time. He has normal strength. No cranial nerve deficit or sensory deficit. Coordination normal. GCS eye subscore is 4. GCS verbal subscore is 5. GCS motor subscore is 6.  Skin: Skin is warm, dry and intact. No rash noted. No cyanosis.  Psychiatric: He has a normal mood and affect. His speech is normal and behavior is normal. Thought content  normal.  Nursing note and vitals reviewed.   ED Course  Procedures (including critical care time) Labs Review Labs Reviewed  CBC WITH DIFFERENTIAL/PLATELET - Abnormal; Notable for the following:    Hemoglobin 12.9 (*)    Monocytes Relative 13 (*)    All other components within normal limits  BASIC METABOLIC PANEL - Abnormal; Notable for the following:    Chloride 112 (*)    CO2 20 (*)    Glucose, Bld 119 (*)    BUN 23 (*)    Creatinine, Ser 1.59 (*)    GFR calc non Af Amer 42 (*)    GFR calc Af Amer 48 (*)    All other components within normal limits  URINALYSIS, ROUTINE W  REFLEX MICROSCOPIC - Abnormal; Notable for the following:    APPearance CLOUDY (*)    Protein, ur 30 (*)    All other components within normal limits  URINE MICROSCOPIC-ADD ON - Abnormal; Notable for the following:    Squamous Epithelial / LPF FEW (*)    Bacteria, UA MANY (*)    Casts HYALINE CASTS (*)    All other components within normal limits  CBC - Abnormal; Notable for the following:    RBC 4.10 (*)    Hemoglobin 11.4 (*)    HCT 36.1 (*)    All other components within normal limits  CREATININE, SERUM - Abnormal; Notable for the following:    Creatinine, Ser 1.41 (*)    GFR calc non Af Amer 48 (*)    GFR calc Af Amer 56 (*)    All other components within normal limits  CBC - Abnormal; Notable for the following:    RBC 3.86 (*)    Hemoglobin 10.8 (*)    HCT 33.9 (*)    All other components within normal limits  BASIC METABOLIC PANEL - Abnormal; Notable for the following:    Chloride 112 (*)    CO2 20 (*)    Glucose, Bld 106 (*)    BUN 26 (*)    Creatinine, Ser 1.37 (*)    Calcium 8.3 (*)    GFR calc non Af Amer 50 (*)    GFR calc Af Amer 58 (*)    All other components within normal limits  TROPONIN I    Imaging Review Ct Head Wo Contrast  10/04/2014   CLINICAL DATA:  Fall, hit head today  EXAM: CT HEAD WITHOUT CONTRAST  TECHNIQUE: Contiguous axial images were obtained from the base of  the skull through the vertex without intravenous contrast.  COMPARISON:  06/23/2010  FINDINGS: No skull fracture is noted. Paranasal sinuses and mastoid air cells are unremarkable. No intracranial hemorrhage, mass effect or midline shift. No acute infarction. No mass lesion is noted on this unenhanced scan. The gray and white-matter differentiation is preserved. Minimal cerebral atrophy.  IMPRESSION: No acute intracranial abnormality.  No significant change.   Electronically Signed   By: Lahoma Crocker M.D.   On: 10/04/2014 11:47   Ct Femur Left W Contrast  10/04/2014   CLINICAL DATA:  Left thigh swelling for 1 month. The patient is unable to bear weight. Question mass.  EXAM: CT OF THE LEFT FEMUR WITH CONTRAST  TECHNIQUE: Contiguous axial CT images are taken through the left upper leg after the administration of contrast material.  CONTRAST:  100 mL OMNIPAQUE IOHEXOL 300 MG/ML  SOLN  COMPARISON:  None.  FINDINGS: The patient has a left total hip arthroplasty. The femoral component has broken through the lateral cortex of the femur and is angulated with the tip protruding into the lateral compartment musculature. This accounts for the patient's possible mass. No soft tissue mass is identified. The acetabular component of the patient's left hip arthroplasty is unremarkable. Imaged musculature demonstrates mild atrophy of the scratch there is mild atrophy of the gluteal musculature. Imaged musculature is otherwise unremarkable. Degenerative disease about the left knee is noted.  IMPRESSION: Negative for mass lesion. The femoral component of the patient's left hip arthroplasty has broken through the lateral cortex of the femur with the tip protruding into the lateral compartment musculature accounting for the patient's possible mass.   Electronically Signed   By: Inge Rise M.D.   On: 10/04/2014 14:34  Dg Pelvis Portable  10/04/2014   CLINICAL DATA:  Fall today with loss of consciousness. Increasing left lower  extremity swelling and pain for 2 months appear  EXAM: PORTABLE PELVIS 1-2 VIEWS  COMPARISON:  CT same date.  FINDINGS: The bones are mildly demineralized. Patient is status post bilateral total hip arthroplasty. As seen on CT, the left femoral prosthesis has broken through the lateral cortex of the femur with surrounding lucency. This is incompletely visualized. There is no evidence of acute pelvic fracture or dislocation. There is no loosening of the acetabular cups. Contrast material is present within the urinary bladder from preceding CT. Moderate lower lumbar spondylosis noted.  IMPRESSION: The femoral component of the left hip arthroplasty extends lateral to the femoral cortex as on CT. No evidence of acute pelvic fracture.   Electronically Signed   By: Richardean Sale M.D.   On: 10/04/2014 18:32   Chest Portable 1 View  10/04/2014   CLINICAL DATA:  Preop evaluation prior to revision left hip arthroplasty  EXAM: PORTABLE CHEST - 1 VIEW  COMPARISON:  03/22/2011  FINDINGS: There are bilateral chronic bronchitic changes. There is no focal parenchymal opacity, pleural effusion, or pneumothorax. The heart and mediastinal contours are unremarkable.  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  IMPRESSION: No active disease.   Electronically Signed   By: Kathreen Devoid   On: 10/04/2014 18:35   Dg Hip Unilat With Pelvis 2-3 Views Left  10/04/2014   CLINICAL DATA:  Hip fracture  EXAM: LEFT HIP (WITH PELVIS) 2-3 VIEWS  COMPARISON:  None.  FINDINGS: Left total hip arthroplasty with lateral displacement of the femoral stem component from the femur with osteolysis of the lateral cortex of the left proximal femoral diaphysis. There is periprosthetic lucency around the trochanteric component of the arthroplasty. The overall appearance is consistent with hardware failure.  There is no other fracture or dislocation.  IMPRESSION: Left total hip arthroplasty with lateral displacement of the femoral stem  component from the femur with osteolysis of the lateral cortex of the left proximal femoral diaphysis. There is periprosthetic lucency around the trochanteric component of the arthroplasty consistent with loosening.   Electronically Signed   By: Kathreen Devoid   On: 10/04/2014 18:35     EKG Interpretation   Date/Time:  Tuesday Oct 04 2014 10:39:56 EDT Ventricular Rate:  73 PR Interval:  143 QRS Duration: 81 QT Interval:  398 QTC Calculation: 439 R Axis:   59 Text Interpretation:  Sinus rhythm Probable LVH with secondary repol abnrm  No significant change since last tracing Confirmed by POLLINA  MD,  CHRISTOPHER [phone]) on 10/04/2014 12:41:38 PM      MDM   Final diagnoses:  Syncope  Mass   left hip fracture  Patient presented to the ER for evaluation of fall or possibly syncope. Patient reports falling to the ground while trying to reach above his head and then waking up on the ground. He thinks he might have slipped, but it's not clear if he passed out to cause the fall. Patient reports that he has had weakness of his legs ongoing for several months. He also reports that he has had pain in his left hip and thigh with swelling of the thigh more than a month.  Patient had a workup for syncope, CT head was unremarkable. Cardiac evaluation was unremarkable. Patient complained of pain and swelling of the leg ongoing for more than a month. DVT study was negative. Examination revealed a  mass in the lateral aspect of the thigh. CT scan was performed and it was discovered that this was fracture of the left hip prosthesis to the cortex of the bone was palpable beneath the surface. He did have a fall today, but I suspect this has been present for some time, because he reports that the swelling and bulging of his thigh is no different today and has been there for at least one or possibly 2 months.  Discussed with Dr. Gladstone Lighter, orthopedics. He will see the patient in the hospital. Patient will be  admitted to the hospitalist service.    Orpah Greek, MD 10/05/14 [phone]

## 2014-10-04 NOTE — ED Notes (Signed)
Pt reports that he was trying to pull his records off the top shelf of his closet, when his legs got tangled causing him to fall.  Sts when he "woke up" his table was knocked over and he called his doctor.  Also, Pt c/o increasing LLE swelling which causes pain "all over" his body x 2 months.  Pt reports that he has mentioned this to his PCP, but they "haven't done anything."

## 2014-10-04 NOTE — Clinical Social Work Note (Addendum)
Clinical Social Work Assessment  Patient Details  Name: Billy Lopez. MRN: [id number] Date of Birth: 24-Aug-1940  Date of referral:  10/04/14               Reason for consult:   (From [facility])                Permission sought to share information with:   (None.) Permission granted to share information::  No  Name::        Agency::     Relationship::     Contact Information:     Housing/Transportation Living arrangements for the past 2 months:  Apartment Source of Information:  Patient Patient Interpreter Needed:  None Criminal Activity/Legal Involvement Pertinent to Current Situation/Hospitalization:  No - Comment as needed Significant Relationships:  Friend (Patient states that his friends in the community come to visit him daily. He states that his friend Shanon Brow is supportive. Shanon Brow [phone]) Lives with:  Self Do you feel safe going back to the place where you live?  Yes (Pt reports that he currently receives home health. He states that they come x7 a week for 5 hours.) Need for family participation in patient care:  Yes (Comment) (Patient states that friends are his primary support. )  Care giving concerns:  The pt currently lives at home alone in [place]. He states that he receives assistance with completing his ADL's. Patient states that he has home health and that they come to his house x7 days a week for 5 hours. He states that they also fix his meals.   Patient states that if he needs PT he would like them to come to his home.   Social Worker assessment / plan:  CSW met with pt at bedside. There was no family present. Patient confirms that he lives at home alone. He also confirms that he presents to [facility] due to falling today. Patient stated " I was reaching on top of the closet and tripped over my foot." Patient informed CSW that this is his 2nd fall within a 6 month period.  Patient informed CSW that he does have equipment at home. He states that he currently  has a wheelchair, electric wheelchair, walker, and cane. Per note, the pt is wheelchair bound.  Patient states that he has a good support system that includes his friends. He states that friends from the community visit him daily.   Employment status:  Retired Forensic scientist:   [employer] PT Recommendations:    Information / Referral to community resources:     Patient/Family's Response to care:  Patient is aware that he is being admitted. His response is appropriate at this time. He states his main concern is his hip.  Patient/Family's Understanding of and Emotional Response to Diagnosis, Current Treatment, and Prognosis: Patient is aware that he presents to [facility] due to a fall.  Emotional Assessment Appearance:   Stated age. Attitude/Demeanor/Rapport:   Appropriate. Affect (typically observed):    Orientation:   Alert and Oriented x4. Alcohol / Substance use:   Never Used. Psych involvement (Current and /or in the community):   None.  Discharge Needs  Concerns to be addressed:    Adjustment to Illness Readmission within the last 30 days:   No. Current discharge risk:   No. Barriers to Discharge:   No.   Kynzleigh Bandel R, LCSW 10/04/2014, 10:29 PM

## 2014-10-04 NOTE — Progress Notes (Signed)
EDCM spoke to patient at bedside.  Patient lives at home alone.  Patient reports he has home health services with agency called [facility].  This agency sees patient everyday for five hours a day. Patient reports agency helps him with cleaning, shower, shaving,and cooking.  Patient reports he has friends who check in on him daily.  Patient reports he has an IT trainer wheelchair, walker, cane, shower chair and bedside commode at home.  Patient fell at home today.  [facility] asked patient if he can walk?  Patient responded, "I can walk but not the way I want to walk."  [facility] provided patient with list of home health agencies in [facility].  EDCM explained with home health, the patient may receive a visiting RN, PT, OT, aide and Education officer, museum.  While [facility] in patient's room he received a phone call in regards to an appointment for May 4 at [facility] with [facility].  Patient asked for assistance is cancelling this appointment.  [facility] called [facility] and spoke to [facility] who cancelled appointment for tomorrow.  Patrice asked if patient wanted to reschedule, stating that appointments are booked until August or September.  [facility] informed patient of this and patient stated he would like to calll himself and reschedule when he is discharged from the hospital.  [facility] notified Patrice at [facility].  Patient thankful for services.  No further EDCM needs at this time.

## 2014-10-04 NOTE — Progress Notes (Signed)
*  PRELIMINARY RESULTS* Vascular Ultrasound Left lower extremity venous duplex has been completed.  Preliminary findings: Negative for DVT.  Landry Mellow, RDMS, RVT  10/04/2014, 12:03 PM

## 2014-10-04 NOTE — ED Notes (Addendum)
Per EMS pt, who is "wheelchair bound," was walking around his apartment, fell to the floor, hit his life alert button. Pt requests evaluation to to increasing weakness and leg edema. Per EMS pt denies LOC, dizziness, acute pain.

## 2014-10-04 NOTE — ED Notes (Signed)
In contrast to previous note, patient now states that he lost consciousness after fall and prior to calling for paramedics.

## 2014-10-05 DIAGNOSIS — B20 Human immunodeficiency virus [HIV] disease: Secondary | ICD-10-CM

## 2014-10-05 DIAGNOSIS — I10 Essential (primary) hypertension: Secondary | ICD-10-CM

## 2014-10-05 LAB — BASIC METABOLIC PANEL
Anion gap: 6 (ref 5–15)
BUN: 26 mg/dL — ABNORMAL HIGH (ref 6–20)
CALCIUM: 8.3 mg/dL — AB (ref 8.9–10.3)
CO2: 20 mmol/L — AB (ref 22–32)
CREATININE: 1.37 mg/dL — AB (ref 0.61–1.24)
Chloride: 112 mmol/L — ABNORMAL HIGH (ref 101–111)
GFR calc Af Amer: 58 mL/min — ABNORMAL LOW (ref >60)
GFR calc non Af Amer: 50 mL/min — ABNORMAL LOW (ref >60)
GLUCOSE: 106 mg/dL — AB (ref 70–99)
Potassium: 3.8 mmol/L (ref 3.5–5.1)
Sodium: 138 mmol/L (ref 135–145)

## 2014-10-05 LAB — CBC
HEMATOCRIT: 33.9 % — AB (ref 39.0–52.0)
Hemoglobin: 10.8 g/dL — ABNORMAL LOW (ref 13.0–17.0)
MCH: 28 pg (ref 26.0–34.0)
MCHC: 31.9 g/dL (ref 30.0–36.0)
MCV: 87.8 fL (ref 78.0–100.0)
Platelets: 208 10*3/uL (ref 150–400)
RBC: 3.86 MIL/uL — ABNORMAL LOW (ref 4.22–5.81)
RDW: 14.9 % (ref 11.5–15.5)
WBC: 4.9 10*3/uL (ref 4.0–10.5)

## 2014-10-05 MED ORDER — NICOTINE 21 MG/24HR TD PT24: 21.0000 mg | MEDICATED_PATCH | Freq: Every day | TRANSDERMAL | Status: DC

## 2014-10-05 MED ORDER — HYDROMORPHONE HCL 1 MG/ML IJ SOLN: 0.5000 mg | INTRAMUSCULAR | Status: DC | PRN

## 2014-10-05 MED ORDER — SPIRITUS FRUMENTI: 2.0000 | Freq: Every day | ORAL | Status: DC

## 2014-10-05 MED ORDER — SENNOSIDES-DOCUSATE SODIUM 8.6-50 MG PO TABS
1.0000 | ORAL_TABLET | Freq: Every day | ORAL | Status: DC
  Administered 2014-10-06 – 2014-10-12 (×6): 1 via ORAL
  Filled 2014-10-05 (×7): qty 1

## 2014-10-05 NOTE — Progress Notes (Signed)
[facility] PROGRESS NOTE  [name] VFI:[id number] DOB: [birth year] DOA: 10/04/2014 PCP: [name], MD  Assessment/Plan: 74 y/o male with PMH of HTN, Substance Abuse, COPD, CKD, Hepatitis, HIV, h/o L hip arthroplasty  presented with mechanical fall found to have broken L arthoplasty -admitted with L hip pian, left hip arthroplasty has broken.    1. Mechanical fall->L hip pian, left hip arthroplasty has broken. Per ortho: surgery planned for Friday  2. CKD. Will hold ACE, HCTZ. d/c naproxen. Monitor  3. COPD. No wheezing on exam. CXR: no clear infiltrates. Cont inhalers prn  4. HTN. BP is soft. Holding ACE/hctz  5. HIV. Cont home regimen   Code Status: full Family Communication: d/w patient (indicate person spoken with, relationship, and if by phone, the number) Disposition Plan: pend surgery    Consultants:  Ortho   Procedures:  noen  Antibiotics:  none (indicate start date, and stop date if known)  HPI/Subjective: Alert, oriented   Objective: Filed Vitals:   10/05/14 1335  BP: 113/72  Pulse: 85  Temp: 99.3 F (37.4 C)  Resp: 18    Intake/Output Summary (Last 24 hours) at 10/05/14 1536 Last data filed at 10/05/14 1517  Gross per 24 hour  Intake 1988.75 ml  Output   1951 ml  Net  37.75 ml   Filed Weights   10/04/14 2000  Weight: 74.39 kg (164 lb)    Exam:   General:  alert  Cardiovascular: s1,s2 rrr  Respiratory: CTA BL  Abdomen: soft, nt,nd   Musculoskeletal: no leg edema   Data Reviewed: Basic Metabolic Panel:  Recent Labs Lab 10/04/14 1046 10/04/14 1751 10/05/14 0455  NA 138  --  138  K 3.8  --  3.8  CL 112*  --  112*  CO2 20*  --  20*  GLUCOSE 119*  --  106*  BUN 23*  --  26*  CREATININE 1.59* 1.41* 1.37*  CALCIUM 9.1  --  8.3*   Liver Function Tests: No results for input(s): AST, ALT, ALKPHOS, BILITOT, PROT, ALBUMIN in the last 168 hours. No results for input(s): LIPASE, AMYLASE in the last 168 hours. No  results for input(s): AMMONIA in the last 168 hours. CBC:  Recent Labs Lab 10/04/14 1046 10/04/14 1751 10/05/14 0455  WBC 6.4 5.5 4.9  NEUTROABS 4.5  --   --   HGB 12.9* 11.4* 10.8*  HCT 40.4 36.1* 33.9*  MCV 87.6 88.0 87.8  PLT 282 234 208   Cardiac Enzymes:  Recent Labs Lab 10/04/14 1046  TROPONINI <0.03   BNP (last 3 results) No results for input(s): BNP in the last 8760 hours.  ProBNP (last 3 results) No results for input(s): PROBNP in the last 8760 hours.  CBG: No results for input(s): GLUCAP in the last 168 hours.  No results found for this or any previous visit (from the past 240 hour(s)).   Studies: Ct Head Wo Contrast  10/04/2014   CLINICAL DATA:  Fall, hit head today  EXAM: CT HEAD WITHOUT CONTRAST  TECHNIQUE: Contiguous axial images were obtained from the base of the skull through the vertex without intravenous contrast.  COMPARISON:  06/23/2010  FINDINGS: No skull fracture is noted. Paranasal sinuses and mastoid air cells are unremarkable. No intracranial hemorrhage, mass effect or midline shift. No acute infarction. No mass lesion is noted on this unenhanced scan. The gray and white-matter differentiation is preserved. Minimal cerebral atrophy.  IMPRESSION: No acute intracranial abnormality.  No significant change.  Electronically Signed   By: Lahoma Crocker M.D.   On: 10/04/2014 11:47   Ct Femur Left W Contrast  10/04/2014   CLINICAL DATA:  Left thigh swelling for 1 month. The patient is unable to bear weight. Question mass.  EXAM: CT OF THE LEFT FEMUR WITH CONTRAST  TECHNIQUE: Contiguous axial CT images are taken through the left upper leg after the administration of contrast material.  CONTRAST:  100 mL OMNIPAQUE IOHEXOL 300 MG/ML  SOLN  COMPARISON:  None.  FINDINGS: The patient has a left total hip arthroplasty. The femoral component has broken through the lateral cortex of the femur and is angulated with the tip protruding into the lateral compartment musculature.  This accounts for the patient's possible mass. No soft tissue mass is identified. The acetabular component of the patient's left hip arthroplasty is unremarkable. Imaged musculature demonstrates mild atrophy of the scratch there is mild atrophy of the gluteal musculature. Imaged musculature is otherwise unremarkable. Degenerative disease about the left knee is noted.  IMPRESSION: Negative for mass lesion. The femoral component of the patient's left hip arthroplasty has broken through the lateral cortex of the femur with the tip protruding into the lateral compartment musculature accounting for the patient's possible mass.   Electronically Signed   By: Inge Rise M.D.   On: 10/04/2014 14:34   Dg Pelvis Portable  10/04/2014   CLINICAL DATA:  Fall today with loss of consciousness. Increasing left lower extremity swelling and pain for 2 months appear  EXAM: PORTABLE PELVIS 1-2 VIEWS  COMPARISON:  CT same date.  FINDINGS: The bones are mildly demineralized. Patient is status post bilateral total hip arthroplasty. As seen on CT, the left femoral prosthesis has broken through the lateral cortex of the femur with surrounding lucency. This is incompletely visualized. There is no evidence of acute pelvic fracture or dislocation. There is no loosening of the acetabular cups. Contrast material is present within the urinary bladder from preceding CT. Moderate lower lumbar spondylosis noted.  IMPRESSION: The femoral component of the left hip arthroplasty extends lateral to the femoral cortex as on CT. No evidence of acute pelvic fracture.   Electronically Signed   By: Richardean Sale M.D.   On: 10/04/2014 18:32   Chest Portable 1 View  10/04/2014   CLINICAL DATA:  Preop evaluation prior to revision left hip arthroplasty  EXAM: PORTABLE CHEST - 1 VIEW  COMPARISON:  03/22/2011  FINDINGS: There are bilateral chronic bronchitic changes. There is no focal parenchymal opacity, pleural effusion, or pneumothorax. The heart and  mediastinal contours are unremarkable.  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  IMPRESSION: No active disease.   Electronically Signed   By: Kathreen Devoid   On: 10/04/2014 18:35   Dg Hip Unilat With Pelvis 2-3 Views Left  10/04/2014   CLINICAL DATA:  Hip fracture  EXAM: LEFT HIP (WITH PELVIS) 2-3 VIEWS  COMPARISON:  None.  FINDINGS: Left total hip arthroplasty with lateral displacement of the femoral stem component from the femur with osteolysis of the lateral cortex of the left proximal femoral diaphysis. There is periprosthetic lucency around the trochanteric component of the arthroplasty. The overall appearance is consistent with hardware failure.  There is no other fracture or dislocation.  IMPRESSION: Left total hip arthroplasty with lateral displacement of the femoral stem component from the femur with osteolysis of the lateral cortex of the left proximal femoral diaphysis. There is periprosthetic lucency around the trochanteric component of the arthroplasty consistent  with loosening.   Electronically Signed   By: Kathreen Devoid   On: 10/04/2014 18:35    Scheduled Meds: . cholecalciferol  1,000 Units Oral QPM  . efavirenz-emtricitabine-tenofovir  1 tablet Oral QHS  . enoxaparin (LOVENOX) injection  40 mg Subcutaneous Q24H  . feeding supplement (ENSURE ENLIVE)  237 mL Oral BID BM  . tamsulosin  0.4 mg Oral BID  . vitamin A  10,000 Units Oral QPM  . vitamin C  500 mg Oral QPM   Continuous Infusions: . 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 If 7PM-7AM, please contact night-coverage at [email or web address], password [facility] 10/05/2014, 3:36 PM  LOS: 1  day

## 2014-10-05 NOTE — Care Management Note (Signed)
Case Management Note  Patient Details  Name: Billy Lopez. MRN: [id number] Date of Birth: 1940-07-15  Subjective/Objective:                    Periprosthetic fracture around internal prosthetic left hip joint Action/Plan: Discharge planning  Expected Discharge Date:   (UNKNOWN)               Expected Discharge Plan:  Skilled Nursing Facility  In-House Referral:     Discharge planning Services  CM Consult  Post Acute Care Choice:    Choice offered to:     DME Arranged:    DME Agency:     HH Arranged:    [place] Agency:     Status of Service:     Medicare Important Message Given:    Date Medicare IM Given:    Medicare IM give by:    Date Additional Medicare IM Given:    Additional Medicare Important Message give by:     If discussed at [place] of Stay Meetings, dates discussed:    Additional Comments: Utilization Review Complete.  CM notes plan is for SNF; CSW arranging.  NO other CM needs communicated.   Dellie Catholic, RN 10/05/2014, 12:28 PM

## 2014-10-05 NOTE — Progress Notes (Signed)
CSW met with pt today to assist with d/c planning. Hip surgery is planned for [facility]. Pt is willing to consider ST Rehab if recommended following surgery. Pt gave CSW permission to initiate SNF search. Bed offers are pending. CSW will continue to follow to assist with d/c planning needs.  Werner Lean LCSW [phone]

## 2014-10-05 NOTE — Progress Notes (Signed)
Initial Nutrition Assessment  DOCUMENTATION CODES:  INTERVENTION:  Ensure Enlive (each supplement provides 350kcal and 20 grams of protein)- BID between meals  NUTRITION DIAGNOSIS:  Increased nutrient needs related to hip fracture as evidenced by  (wt loss).  GOAL:  Patient will meet greater than or equal to 90% of their needs  MONITOR:  PO intake, Supplement acceptance, Weight trends  REASON FOR ASSESSMENT:  Consult Hip fracture protocol  ASSESSMENT: 74 y.o. male, with past medical history of hypertension, COPD, HIV, hepatitis C, hepatitis B, who presents to ED for evaluation of fall  - Pt to undergo surgical hip fracture repair 5/4 or 5/5.  - Reports no recent changes in appetite or weight loss.  - Possible 4 lb wt loss.  - Pt has not had a bm today.  - Drank one Ensure supplement and ate ~100% of lunch. Agreed to continue nutritional supplements.  - Labs and medications reviewed  Height:  Ht Readings from Last 1 Encounters:  10/04/14 5\' 8"  (1.727 m)    Weight:  Wt Readings from Last 1 Encounters:  10/04/14 164 lb (74.39 kg)    Ideal Body Weight:  70 kg  Wt Readings from Last 10 Encounters:  10/04/14 164 lb (74.39 kg)  09/29/14 164 lb (74.39 kg)  04/19/14 163 lb 4 oz (74.05 kg)  03/30/14 166 lb (75.297 kg)  10/19/13 172 lb (78.019 kg)  10/14/13 177 lb (80.287 kg)  06/17/13 167 lb (75.751 kg)  04/16/13 167 lb 8 oz (75.978 kg)  02/16/13 166 lb (75.297 kg)  01/11/13 160 lb 8 oz (72.802 kg)    BMI:  Body mass index is 24.94 kg/(m^2).  Estimated Nutritional Needs:  Kcal:  1900-2100  Protein:  105-120 g  Fluid:  2.0 L/day  Skin:     Diet Order:  Diet regular Room service appropriate?: Yes; Fluid consistency:: Thin  EDUCATION NEEDS:  Education needs addressed   Intake/Output Summary (Last 24 hours) at 10/05/14 1440 Last data filed at 10/05/14 1336  Gross per 24 hour  Intake 1748.75 ml  Output   1950 ml  Net -201.25 ml    Last BM:   5/3 per pt report  Laurette Schimke [place], [place], [place]

## 2014-10-05 NOTE — Progress Notes (Signed)
Blood was noted in stool, pt states " this is normal after prostate cancer."

## 2014-10-06 DIAGNOSIS — J449 Chronic obstructive pulmonary disease, unspecified: Secondary | ICD-10-CM

## 2014-10-06 NOTE — Progress Notes (Signed)
Patient ID: Billy Brink., male   DOB: January 21, 1941, 74 y.o.   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

## 2014-10-06 NOTE — Progress Notes (Signed)
[facility] PROGRESS NOTE  Billy Lopez. [id number]:[id number] DOB: 01-09-1941 DOA: 10/04/2014 PCP: [name], MD  Assessment/Plan: 74 y/o male with PMH of HTN, Substance Abuse, h/o Prostate CA, COPD, CKD, Hepatitis, HIV, h/o L hip arthroplasty  presented with mechanical fall found to have broken L arthoplasty -admitted with L hip pian, left hip arthroplasty has broken. awaiting ortho surgery    1. Mechanical fall->L hip pian, left hip arthroplasty has broken. Per ortho: surgery planned ? Friday or Saturday   2. CKD. Will hold ACE, HCTZ. d/c naproxen. Monitor  3. COPD. No wheezing on exam. CXR: no clear infiltrates. Cont inhalers prn  4. HTN. BP is soft. Holding ACE/hctz  5. HIV. Cont home regimen   Code Status: full Family Communication: d/w patient (indicate person spoken with, relationship, and if by phone, the number) Disposition Plan: pend surgery    Consultants:  Ortho   Procedures:  none  Antibiotics:  none (indicate start date, and stop date if known)  HPI/Subjective: Alert, oriented   Objective: Filed Vitals:   10/06/14 1035  BP: 110/75  Pulse: 77  Temp: 98.3 F (36.8 C)  Resp: 20    Intake/Output Summary (Last 24 hours) at 10/06/14 1100 Last data filed at 10/06/14 0926  Gross per 24 hour  Intake   2175 ml  Output   2152 ml  Net     23 ml   Filed Weights   10/04/14 2000  Weight: 74.39 kg (164 lb)    Exam:   General:  alert  Cardiovascular: s1,s2 rrr  Respiratory: CTA BL  Abdomen: soft, nt,nd   Musculoskeletal: no leg edema   Data Reviewed: Basic Metabolic Panel:  Recent Labs Lab 10/04/14 1046 10/04/14 1751 10/05/14 0455  NA 138  --  138  K 3.8  --  3.8  CL 112*  --  112*  CO2 20*  --  20*  GLUCOSE 119*  --  106*  BUN 23*  --  26*  CREATININE 1.59* 1.41* 1.37*  CALCIUM 9.1  --  8.3*   Liver Function Tests: No results for input(s): AST, ALT, ALKPHOS, BILITOT, PROT, ALBUMIN in the last 168 hours. No results for  input(s): LIPASE, AMYLASE in the last 168 hours. No results for input(s): AMMONIA in the last 168 hours. CBC:  Recent Labs Lab 10/04/14 1046 10/04/14 1751 10/05/14 0455  WBC 6.4 5.5 4.9  NEUTROABS 4.5  --   --   HGB 12.9* 11.4* 10.8*  HCT 40.4 36.1* 33.9*  MCV 87.6 88.0 87.8  PLT 282 234 208   Cardiac Enzymes:  Recent Labs Lab 10/04/14 1046  TROPONINI <0.03   BNP (last 3 results) No results for input(s): BNP in the last 8760 hours.  ProBNP (last 3 results) No results for input(s): PROBNP in the last 8760 hours.  CBG: No results for input(s): GLUCAP in the last 168 hours.  No results found for this or any previous visit (from the past 240 hour(s)).   Studies: Ct Head Wo Contrast  10/04/2014   CLINICAL DATA:  Fall, hit head today  EXAM: CT HEAD WITHOUT CONTRAST  TECHNIQUE: Contiguous axial images were obtained from the base of the skull through the vertex without intravenous contrast.  COMPARISON:  06/23/2010  FINDINGS: No skull fracture is noted. Paranasal sinuses and mastoid air cells are unremarkable. No intracranial hemorrhage, mass effect or midline shift. No acute infarction. No mass lesion is noted on this unenhanced scan. The gray and white-matter differentiation is preserved.  Minimal cerebral atrophy.  IMPRESSION: No acute intracranial abnormality.  No significant change.   Electronically Signed   By: Lahoma Crocker M.D.   On: 10/04/2014 11:47   Ct Femur Left W Contrast  10/04/2014   CLINICAL DATA:  Left thigh swelling for 1 month. The patient is unable to bear weight. Question mass.  EXAM: CT OF THE LEFT FEMUR WITH CONTRAST  TECHNIQUE: Contiguous axial CT images are taken through the left upper leg after the administration of contrast material.  CONTRAST:  100 mL OMNIPAQUE IOHEXOL 300 MG/ML  SOLN  COMPARISON:  None.  FINDINGS: The patient has a left total hip arthroplasty. The femoral component has broken through the lateral cortex of the femur and is angulated with the tip  protruding into the lateral compartment musculature. This accounts for the patient's possible mass. No soft tissue mass is identified. The acetabular component of the patient's left hip arthroplasty is unremarkable. Imaged musculature demonstrates mild atrophy of the scratch there is mild atrophy of the gluteal musculature. Imaged musculature is otherwise unremarkable. Degenerative disease about the left knee is noted.  IMPRESSION: Negative for mass lesion. The femoral component of the patient's left hip arthroplasty has broken through the lateral cortex of the femur with the tip protruding into the lateral compartment musculature accounting for the patient's possible mass.   Electronically Signed   By: Inge Rise M.D.   On: 10/04/2014 14:34   Dg Pelvis Portable  10/04/2014   CLINICAL DATA:  Fall today with loss of consciousness. Increasing left lower extremity swelling and pain for 2 months appear  EXAM: PORTABLE PELVIS 1-2 VIEWS  COMPARISON:  CT same date.  FINDINGS: The bones are mildly demineralized. Patient is status post bilateral total hip arthroplasty. As seen on CT, the left femoral prosthesis has broken through the lateral cortex of the femur with surrounding lucency. This is incompletely visualized. There is no evidence of acute pelvic fracture or dislocation. There is no loosening of the acetabular cups. Contrast material is present within the urinary bladder from preceding CT. Moderate lower lumbar spondylosis noted.  IMPRESSION: The femoral component of the left hip arthroplasty extends lateral to the femoral cortex as on CT. No evidence of acute pelvic fracture.   Electronically Signed   By: Richardean Sale M.D.   On: 10/04/2014 18:32   Chest Portable 1 View  10/04/2014   CLINICAL DATA:  Preop evaluation prior to revision left hip arthroplasty  EXAM: PORTABLE CHEST - 1 VIEW  COMPARISON:  03/22/2011  FINDINGS: There are bilateral chronic bronchitic changes. There is no focal parenchymal  opacity, pleural effusion, or pneumothorax. The heart and mediastinal contours are unremarkable.  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  IMPRESSION: No active disease.   Electronically Signed   By: Kathreen Devoid   On: 10/04/2014 18:35   Dg Hip Unilat With Pelvis 2-3 Views Left  10/04/2014   CLINICAL DATA:  Hip fracture  EXAM: LEFT HIP (WITH PELVIS) 2-3 VIEWS  COMPARISON:  None.  FINDINGS: Left total hip arthroplasty with lateral displacement of the femoral stem component from the femur with osteolysis of the lateral cortex of the left proximal femoral diaphysis. There is periprosthetic lucency around the trochanteric component of the arthroplasty. The overall appearance is consistent with hardware failure.  There is no other fracture or dislocation.  IMPRESSION: Left total hip arthroplasty with lateral displacement of the femoral stem component from the femur with osteolysis of the lateral cortex of the left  proximal femoral diaphysis. There is periprosthetic lucency around the trochanteric component of the arthroplasty consistent with loosening.   Electronically Signed   By: Kathreen Devoid   On: 10/04/2014 18:35    Scheduled Meds: . cholecalciferol  1,000 Units Oral QPM  . efavirenz-emtricitabine-tenofovir  1 tablet Oral QHS  . enoxaparin (LOVENOX) injection  40 mg Subcutaneous Q24H  . feeding supplement (ENSURE ENLIVE)  237 mL Oral BID BM  . senna-docusate  1 tablet Oral QHS  . tamsulosin  0.4 mg Oral BID  . vitamin A  10,000 Units Oral QPM  . vitamin C  500 mg Oral QPM   Continuous Infusions:    Principal Problem:   Periprosthetic fracture around internal prosthetic left hip joint Active Problems:   Human immunodeficiency virus (HIV) disease   Chronic hepatitis C without hepatic coma   Essential hypertension   Hepatitis B    Time spent: >35 minutes     Kinnie Feil  [facility] Pager [phone]. If 7PM-7AM, please contact night-coverage at  [email or web address], password [facility] 10/06/2014, 11:00 AM  LOS: 2 days

## 2014-10-07 LAB — BASIC METABOLIC PANEL
Anion gap: 3 — ABNORMAL LOW (ref 5–15)
BUN: 20 mg/dL (ref 6–20)
CALCIUM: 8.1 mg/dL — AB (ref 8.9–10.3)
CO2: 20 mmol/L — ABNORMAL LOW (ref 22–32)
Chloride: 113 mmol/L — ABNORMAL HIGH (ref 101–111)
Creatinine, Ser: 1.21 mg/dL (ref 0.61–1.24)
GFR, EST NON AFRICAN AMERICAN: 58 mL/min — AB (ref >60)
Glucose, Bld: 110 mg/dL — ABNORMAL HIGH (ref 70–99)
Potassium: 3.7 mmol/L (ref 3.5–5.1)
SODIUM: 136 mmol/L (ref 135–145)

## 2014-10-07 LAB — CBC
HEMATOCRIT: 34.3 % — AB (ref 39.0–52.0)
Hemoglobin: 10.8 g/dL — ABNORMAL LOW (ref 13.0–17.0)
MCH: 27.6 pg (ref 26.0–34.0)
MCHC: 31.5 g/dL (ref 30.0–36.0)
MCV: 87.7 fL (ref 78.0–100.0)
PLATELETS: 201 10*3/uL (ref 150–400)
RBC: 3.91 MIL/uL — ABNORMAL LOW (ref 4.22–5.81)
RDW: 14.8 % (ref 11.5–15.5)
WBC: 5.9 10*3/uL (ref 4.0–10.5)

## 2014-10-07 MED ORDER — IPRATROPIUM-ALBUTEROL 0.5-2.5 (3) MG/3ML IN SOLN: 3.0000 mL | RESPIRATORY_TRACT | Status: DC | PRN

## 2014-10-07 NOTE — Progress Notes (Signed)
Patient ID: Billy Brink., male   DOB: [birth year], 74 y.o.   MRN: [id number]  [facility] PROGRESS NOTE  Billy Brink. DVV:[id number] DOB: Dec 03, 1940 DOA: 2014/10/14 PCP: [name], MD   Brief narrative:    74 y/o male with PMH of HTN, Substance Abuse, h/o Prostate CA, COPD, CKD, Hepatitis, HIV, h/o L hip arthroplasty presented with mechanical fall and found to have broken L arthoplasty.   Admitted with L hip pian, left hip arthroplasty has broken. awaiting ortho surgery   Assessment/Plan:    1. Mechanical fall with L hip pian - left hip arthroplasty has broken. Failed left hip surgery. NPO after MN for surgery tomorrow 2. 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 COPD. No wheezing on exam. CXR: no clear infiltrates. Cont inhalers prn  4. HTN. BP is at target range, continue to hold home antihypertensive regimen  5. HIV. Cont home regimen  6. Anemia of chronic disease, HIV - Hg stable at target range  DVT prophylaxis - SCD's   Code Status: Full.  Family Communication:  plan of care discussed with the patient Disposition Plan: Can not be discharged as surgery planned for AM 5/7 by Dr. Alvan Dame   IV access:  Peripheral IV  Procedures and diagnostic studies:    Ct Head Wo Contrast  14-Oct-2014  No acute intracranial abnormality.  No significant change.     Ct Femur Left W Contrast  Oct 14, 2014  Negative for mass lesion. The femoral component of the patient's left hip arthroplasty has broken through the lateral cortex of the femur with the tip protruding into the lateral compartment musculature accounting for the patient's possible mass.     Dg Pelvis Portable  10-14-14  The femoral component of the left hip arthroplasty extends lateral to the femoral cortex as on CT. No evidence of acute pelvic fracture.     Chest Portable 1 View  14-Oct-2014  No active disease.     Dg Hip Unilat With  Pelvis 2-3 Views Left 2014/10/14  Left total hip arthroplasty with lateral displacement of the femoral stem component from the femur with osteolysis of the lateral cortex of the left proximal femoral diaphysis. There is periprosthetic lucency around the trochanteric component of the arthroplasty consistent with loosening.   Medical Consultants:  Ortho   Other Consultants:  PT/OT  IAnti-Infectives:   None  Faye Ramsay, MD  [facility] Pager [phone]  If 7PM-7AM, please contact night-coverage [email or web address] Password [facility] 10/07/2014, 4:47 PM   LOS: 3 days   HPI/Subjective: No events overnight.   Objective: Filed Vitals:   10/06/14 1940 10/06/14 2115 10/07/14 0435 10/07/14 1435  BP: 136/78 115/79 118/85 131/81  Pulse: 69 90 86 68  Temp: 98.2 F (36.8 C) 98.1 F (36.7 C) 98.8 F (37.1 C) 98.3 F (36.8 C)  TempSrc: Oral Oral Oral Oral  Resp: 18 16 16 20   Height:      Weight:      SpO2: 97% 95% 96% 99%    Intake/Output Summary (Last 24 hours) at 10/07/14 1647 Last data filed at 10/07/14 1438  Gross per 24 hour  Intake    720 ml  Output   1595 ml  Net   -875 ml    Exam:   General:  Pt is alert, follows commands appropriately, not in acute distress  Cardiovascular: Regular rate and rhythm, no rubs,  no gallops  Respiratory: Clear to auscultation bilaterally, no wheezing, no crackles, no rhonchi  Abdomen: Soft, non tender, non distended, bowel sounds present, no guarding  Extremities: pulses DP and PT palpable bilaterally  Neuro: Grossly nonfocal  Data Reviewed: Basic Metabolic Panel:  Recent Labs Lab 10/04/14 1046 10/04/14 1751 10/05/14 0455 10/07/14 0415  NA 138  --  138 136  K 3.8  --  3.8 3.7  CL 112*  --  112* 113*  CO2 20*  --  20* 20*  GLUCOSE 119*  --  106* 110*  BUN 23*  --  26* 20  CREATININE 1.59* 1.41* 1.37* 1.21  CALCIUM 9.1  --  8.3* 8.1*   CBC:  Recent Labs Lab 10/04/14 1046 10/04/14 1751 10/05/14 0455 10/07/14 0415  WBC 6.4  5.5 4.9 5.9  NEUTROABS 4.5  --   --   --   HGB 12.9* 11.4* 10.8* 10.8*  HCT 40.4 36.1* 33.9* 34.3*  MCV 87.6 88.0 87.8 87.7  PLT 282 234 208 201   Cardiac Enzymes:  Recent Labs Lab 10/04/14 1046  TROPONINI <0.03   Scheduled Meds: . cholecalciferol  1,000 Units Oral QPM  . efavirenz-emtricitabine-tenofovir  1 tablet Oral QHS  . feeding supplement (ENSURE ENLIVE)  237 mL Oral BID BM  . senna-docusate  1 tablet Oral QHS  . tamsulosin  0.4 mg Oral BID  . vitamin A  10,000 Units Oral QPM  . vitamin C  500 mg Oral QPM   Continuous Infusions:

## 2014-10-07 NOTE — Progress Notes (Signed)
Patient ID: Billy Lopez., male   DOB: 1940-09-22, 74 y.o.   MRN: [id number]  Failed left hip surgery NPO after MN for surgery tomorrow Consent ordered  Labs reviewed and stable though hgb 10  Surgery tomorrow am at 7:30

## 2014-10-07 NOTE — Anesthesia Preprocedure Evaluation (Addendum)
Anesthesia Evaluation  Patient identified by MRN, date of birth, ID band Patient awake    Reviewed: Allergy & Precautions, H&P , NPO status , Patient's Chart, lab work & pertinent test results  Airway Mallampati: I  TM Distance: >3 FB Neck ROM: Full    Dental  (+) Edentulous Upper, Edentulous Lower, Dental Advisory Given   Pulmonary pneumonia -, COPDformer smoker,  breath sounds clear to auscultation        Cardiovascular hypertension, Pt. on medications Rhythm:Regular Rate:Normal     Neuro/Psych    GI/Hepatic GERD-  Medicated,(+) Hepatitis -, C  Endo/Other    Renal/GU Renal disease     Musculoskeletal   Abdominal   Peds  Hematology   Anesthesia Other Findings  Hypertension , LVH , NSR   HIV infection    Substance abuse.Marland KitchenMarland KitchenMarland KitchenHeroin withdrawal Cervical spondylosis  Lumbar spondylosis  Osteoarthritis   COPD (chronic obstructive pulmonary disease)  Hx of radiation therapy Prostate cancer  H/O blood clots.... Hx of Lovenox; None currently listed Pneumonia   GERD (gastroesophageal reflux disease) Hepatitis Hep C         Reproductive/Obstetrics                          Anesthesia Physical  Anesthesia Plan  ASA: III  Anesthesia Plan: General   Post-op Pain Management:    Induction: Intravenous  Airway Management Planned: Oral ETT  Additional Equipment:   Intra-op Plan:   Post-operative Plan: Extubation in OR  Informed Consent: I have reviewed the patients History and Physical, chart, labs and discussed the procedure including the risks, benefits and alternatives for the proposed anesthesia with the patient or authorized representative who has indicated his/her understanding and acceptance.   Dental Advisory Given  Plan Discussed with: CRNA  Anesthesia Plan Comments: (Needed Phenylephrine support for last GA....  Toe amputation w/ LMA  Platelets okay. Offered spinal  anesthetic, and  GA. Pt opts for  GA . This patient was asked if he had any questions or concerns before the procedure started. )      Anesthesia Quick Evaluation

## 2014-10-07 NOTE — Clinical Social Work Placement (Signed)
   CLINICAL SOCIAL WORK PLACEMENT  NOTE  Date:  10/07/2014  Patient Details  Name: Billy Lopez. MRN: [id number] Date of Birth: [birth year]  Clinical Social Work is seeking post-discharge placement for this patient at the [place] level of care (*CSW will initial, date and re-position this form in  chart as items are completed):  Yes   Patient/family provided with [place] Work Department's list of facilities offering this level of care within the geographic area requested by the patient (or if unable, by the patient's family).  Yes   Patient/family informed of their freedom to choose among providers that offer the needed level of care, that participate in Medicare, Medicaid or managed care program needed by the patient, have an available bed and are willing to accept the patient.  Yes   Patient/family informed of [facility]'s ownership interest in [facility] and [facility], as well as of the fact that they are under no obligation to receive care at these facilities.  PASRR submitted to EDS on 10/05/14     PASRR number received on 10/05/14     Existing PASRR number confirmed on       FL2 transmitted to all facilities in geographic area requested by pt/family on 10/05/14     FL2 transmitted to all facilities within larger geographic area on       Patient informed that his/her managed care company has contracts with or will negotiate with certain facilities, including the following:        Yes   Patient/family informed of bed offers received.  Patient chooses bed at       Physician recommends and patient chooses bed at      Patient to be transferred to   on  .  Patient to be transferred to facility by       Patient family notified on   of transfer.  Name of family member notified:        PHYSICIAN       Additional Comment:    _______________________________________________ Luretha Rued, [place] 10/07/2014,  11:17 AM

## 2014-10-07 NOTE — Progress Notes (Signed)
CSW assisting with d/c planning. Pt will most likely need ST Rehab following hospital d/c. Surgery is planned for 10/08/14. Pt has given CSW permission to investigate possible SNF options. Bed offers have been provided. Pt has not made a choice yet. Weekend CSW will be requested to meet with pt following PT to review recommendations and initiate insurance ( [employer] ) authorization process if SNF is needed.   Werner Lean LCSW [phone]

## 2014-10-08 ENCOUNTER — Inpatient Hospital Stay (HOSPITAL_COMMUNITY): Payer: Commercial Managed Care - HMO

## 2014-10-08 ENCOUNTER — Encounter (HOSPITAL_COMMUNITY)
Admission: RE | Disposition: A | Discharge status: Skilled Nursing Facility | Payer: Self-pay | Source: Home / Self Care | Attending: Internal Medicine

## 2014-10-08 ENCOUNTER — Inpatient Hospital Stay (HOSPITAL_COMMUNITY): Payer: Commercial Managed Care - HMO | Admitting: Anesthesiology

## 2014-10-08 HISTORY — PX: TOTAL HIP REVISION: SHX763

## 2014-10-08 LAB — CBC
HCT: 35.2 % — ABNORMAL LOW (ref 39.0–52.0)
Hemoglobin: 11 g/dL — ABNORMAL LOW (ref 13.0–17.0)
MCH: 27.2 pg (ref 26.0–34.0)
MCHC: 31.3 g/dL (ref 30.0–36.0)
MCV: 87.1 fL (ref 78.0–100.0)
Platelets: 230 10*3/uL (ref 150–400)
RBC: 4.04 MIL/uL — AB (ref 4.22–5.81)
RDW: 14.6 % (ref 11.5–15.5)
WBC: 6.4 10*3/uL (ref 4.0–10.5)

## 2014-10-08 LAB — LACTIC ACID, PLASMA: Lactic Acid, Venous: 2.8 mmol/L (ref 0.5–2.0)

## 2014-10-08 LAB — URINE MICROSCOPIC-ADD ON

## 2014-10-08 LAB — URINALYSIS, ROUTINE W REFLEX MICROSCOPIC
BILIRUBIN URINE: NEGATIVE
GLUCOSE, UA: NEGATIVE mg/dL
Ketones, ur: NEGATIVE mg/dL
Nitrite: NEGATIVE
PH: 7.5 (ref 5.0–8.0)
Protein, ur: NEGATIVE mg/dL
Specific Gravity, Urine: 1.009 (ref 1.005–1.030)
Urobilinogen, UA: 0.2 mg/dL (ref 0.0–1.0)

## 2014-10-08 LAB — BASIC METABOLIC PANEL
ANION GAP: 4 — AB (ref 5–15)
BUN: 20 mg/dL (ref 6–20)
CO2: 20 mmol/L — AB (ref 22–32)
CREATININE: 1.18 mg/dL (ref 0.61–1.24)
Calcium: 8.5 mg/dL — ABNORMAL LOW (ref 8.9–10.3)
Chloride: 114 mmol/L — ABNORMAL HIGH (ref 101–111)
GFR calc non Af Amer: 60 mL/min — ABNORMAL LOW (ref >60)
Glucose, Bld: 112 mg/dL — ABNORMAL HIGH (ref 70–99)
Potassium: 3.9 mmol/L (ref 3.5–5.1)
Sodium: 138 mmol/L (ref 135–145)

## 2014-10-08 LAB — APTT: aPTT: 28 seconds (ref 24–37)

## 2014-10-08 LAB — HEMOGLOBIN AND HEMATOCRIT, BLOOD
HCT: 35.4 % — ABNORMAL LOW (ref 39.0–52.0)
Hemoglobin: 11.3 g/dL — ABNORMAL LOW (ref 13.0–17.0)

## 2014-10-08 LAB — ABO/RH: ABO/RH(D): A POS

## 2014-10-08 LAB — PROCALCITONIN

## 2014-10-08 LAB — PROTIME-INR
INR: 1.22 (ref 0.00–1.49)
Prothrombin Time: 15.5 seconds — ABNORMAL HIGH (ref 11.6–15.2)

## 2014-10-08 SURGERY — TOTAL HIP REVISION
Anesthesia: General | Site: Hip | Laterality: Left

## 2014-10-08 MED ORDER — SODIUM CHLORIDE 0.9 % IV SOLN
INTRAVENOUS | Status: DC
  Administered 2014-10-08 – 2014-10-10 (×2): via INTRAVENOUS
  Filled 2014-10-08 (×15): qty 1000

## 2014-10-08 MED ORDER — FENTANYL CITRATE (PF) 100 MCG/2ML IJ SOLN
INTRAMUSCULAR | Status: AC
  Filled 2014-10-08: qty 2

## 2014-10-08 MED ORDER — SODIUM CHLORIDE 0.9 % IV BOLUS (SEPSIS)
500.0000 mL | Freq: Once | INTRAVENOUS | Status: AC
  Administered 2014-10-08: 23:00:00 via INTRAVENOUS

## 2014-10-08 MED ORDER — ACETAMINOPHEN 650 MG RE SUPP: 650.0000 mg | Freq: Four times a day (QID) | RECTAL | Status: DC | PRN

## 2014-10-08 MED ORDER — ONDANSETRON HCL 4 MG/2ML IJ SOLN: 4.0000 mg | Freq: Four times a day (QID) | INTRAMUSCULAR | Status: DC | PRN

## 2014-10-08 MED ORDER — HYDROMORPHONE HCL 1 MG/ML IJ SOLN
INTRAMUSCULAR | Status: AC
  Administered 2014-10-08: 1 mg
  Filled 2014-10-08: qty 2

## 2014-10-08 MED ORDER — METHOCARBAMOL 1000 MG/10ML IJ SOLN
500.0000 mg | Freq: Four times a day (QID) | INTRAVENOUS | Status: DC | PRN
  Administered 2014-10-10: 500 mg via INTRAVENOUS
  Filled 2014-10-08 (×3): qty 5

## 2014-10-08 MED ORDER — ROCURONIUM BROMIDE 100 MG/10ML IV SOLN
INTRAVENOUS | Status: AC
  Filled 2014-10-08: qty 1

## 2014-10-08 MED ORDER — HYDROMORPHONE HCL 1 MG/ML IJ SOLN
0.5000 mg | INTRAMUSCULAR | Status: DC | PRN
  Administered 2014-10-08 – 2014-10-09 (×5): 1 mg via INTRAVENOUS
  Administered 2014-10-10: 2 mg via INTRAVENOUS
  Administered 2014-10-10: 1 mg via INTRAVENOUS
  Administered 2014-10-10: 2 mg via INTRAVENOUS
  Administered 2014-10-10 (×3): 1 mg via INTRAVENOUS
  Administered 2014-10-11 (×5): 2 mg via INTRAVENOUS
  Administered 2014-10-12: 1 mg via INTRAVENOUS
  Administered 2014-10-12 (×3): 2 mg via INTRAVENOUS
  Filled 2014-10-08: qty 1
  Filled 2014-10-08 (×2): qty 2
  Filled 2014-10-08: qty 1
  Filled 2014-10-08: qty 2
  Filled 2014-10-08: qty 1
  Filled 2014-10-08 (×3): qty 2
  Filled 2014-10-08: qty 1
  Filled 2014-10-08: qty 2
  Filled 2014-10-08 (×2): qty 1
  Filled 2014-10-08 (×2): qty 2
  Filled 2014-10-08 (×2): qty 1
  Filled 2014-10-08 (×2): qty 2

## 2014-10-08 MED ORDER — MENTHOL 3 MG MT LOZG: 1.0000 | LOZENGE | OROMUCOSAL | Status: DC | PRN

## 2014-10-08 MED ORDER — PROPOFOL 10 MG/ML IV BOLUS
INTRAVENOUS | Status: DC | PRN
  Administered 2014-10-08: 140 mg via INTRAVENOUS

## 2014-10-08 MED ORDER — ONDANSETRON HCL 4 MG/2ML IJ SOLN
INTRAMUSCULAR | Status: AC
  Filled 2014-10-08: qty 2

## 2014-10-08 MED ORDER — LIDOCAINE HCL (CARDIAC) 20 MG/ML IV SOLN
INTRAVENOUS | Status: DC | PRN
  Administered 2014-10-08: 100 mg via INTRAVENOUS

## 2014-10-08 MED ORDER — CEFAZOLIN SODIUM-DEXTROSE 2-3 GM-% IV SOLR
INTRAVENOUS | Status: AC
  Filled 2014-10-08: qty 50

## 2014-10-08 MED ORDER — HYDROMORPHONE HCL 1 MG/ML IJ SOLN
INTRAMUSCULAR | Status: DC | PRN
  Administered 2014-10-08 (×4): 1 mg via INTRAVENOUS

## 2014-10-08 MED ORDER — PIPERACILLIN-TAZOBACTAM 3.375 G IVPB
3.3750 g | Freq: Three times a day (TID) | INTRAVENOUS | Status: DC
  Administered 2014-10-09 – 2014-10-13 (×13): 3.375 g via INTRAVENOUS
  Filled 2014-10-08 (×15): qty 50

## 2014-10-08 MED ORDER — LACTATED RINGERS IV SOLN
INTRAVENOUS | Status: DC
  Administered 2014-10-08: 13:00:00 via INTRAVENOUS

## 2014-10-08 MED ORDER — PROMETHAZINE HCL 25 MG/ML IJ SOLN
INTRAMUSCULAR | Status: AC
  Filled 2014-10-08: qty 1

## 2014-10-08 MED ORDER — PHENYLEPHRINE HCL 10 MG/ML IJ SOLN
INTRAMUSCULAR | Status: AC
  Filled 2014-10-08: qty 1

## 2014-10-08 MED ORDER — CEFAZOLIN SODIUM-DEXTROSE 2-3 GM-% IV SOLR
INTRAVENOUS | Status: DC | PRN
  Administered 2014-10-08: 2 g via INTRAVENOUS

## 2014-10-08 MED ORDER — HYDROMORPHONE HCL 2 MG/ML IJ SOLN
INTRAMUSCULAR | Status: AC
  Filled 2014-10-08: qty 1

## 2014-10-08 MED ORDER — FERROUS SULFATE 325 (65 FE) MG PO TABS
325.0000 mg | ORAL_TABLET | Freq: Three times a day (TID) | ORAL | Status: DC
  Administered 2014-10-08 – 2014-10-13 (×14): 325 mg via ORAL
  Filled 2014-10-08 (×15): qty 1

## 2014-10-08 MED ORDER — PROPOFOL 10 MG/ML IV BOLUS
INTRAVENOUS | Status: AC
  Filled 2014-10-08: qty 20

## 2014-10-08 MED ORDER — ONDANSETRON HCL 4 MG/2ML IJ SOLN
INTRAMUSCULAR | Status: DC | PRN
  Administered 2014-10-08: 4 mg via INTRAVENOUS

## 2014-10-08 MED ORDER — PHENOL 1.4 % MT LIQD: 1.0000 | OROMUCOSAL | Status: DC | PRN

## 2014-10-08 MED ORDER — POLYETHYLENE GLYCOL 3350 17 G PO PACK: 17.0000 g | PACK | Freq: Every day | ORAL | Status: DC | PRN

## 2014-10-08 MED ORDER — DEXAMETHASONE SODIUM PHOSPHATE 10 MG/ML IJ SOLN
10.0000 mg | Freq: Once | INTRAMUSCULAR | Status: AC
  Administered 2014-10-09: 10 mg via INTRAVENOUS
  Filled 2014-10-08: qty 1

## 2014-10-08 MED ORDER — VANCOMYCIN HCL IN DEXTROSE 1-5 GM/200ML-% IV SOLN
1000.0000 mg | Freq: Once | INTRAVENOUS | Status: AC
  Administered 2014-10-08: 1000 mg via INTRAVENOUS
  Filled 2014-10-08: qty 200

## 2014-10-08 MED ORDER — METOCLOPRAMIDE HCL 10 MG PO TABS: 5.0000 mg | ORAL_TABLET | Freq: Three times a day (TID) | ORAL | Status: DC | PRN

## 2014-10-08 MED ORDER — 0.9 % SODIUM CHLORIDE (POUR BTL) OPTIME
TOPICAL | Status: DC | PRN
  Administered 2014-10-08: 1000 mL

## 2014-10-08 MED ORDER — LACTATED RINGERS IV SOLN
INTRAVENOUS | Status: DC | PRN
  Administered 2014-10-08 (×3): via INTRAVENOUS

## 2014-10-08 MED ORDER — CEFAZOLIN SODIUM-DEXTROSE 2-3 GM-% IV SOLR
2.0000 g | Freq: Four times a day (QID) | INTRAVENOUS | Status: DC
  Administered 2014-10-08: 2 g via INTRAVENOUS
  Filled 2014-10-08 (×2): qty 50

## 2014-10-08 MED ORDER — ACETAMINOPHEN 325 MG PO TABS: 650.0000 mg | ORAL_TABLET | Freq: Four times a day (QID) | ORAL | Status: DC | PRN

## 2014-10-08 MED ORDER — DEXTROSE 5 % IV SOLN
20.0000 mg | INTRAVENOUS | Status: DC | PRN
  Administered 2014-10-08: 50 ug/min via INTRAVENOUS

## 2014-10-08 MED ORDER — DOCUSATE SODIUM 100 MG PO CAPS
100.0000 mg | ORAL_CAPSULE | Freq: Two times a day (BID) | ORAL | Status: DC
  Administered 2014-10-08 – 2014-10-13 (×9): 100 mg via ORAL
  Filled 2014-10-08 (×8): qty 1

## 2014-10-08 MED ORDER — HYDROCODONE-ACETAMINOPHEN 7.5-325 MG PO TABS
1.0000 | ORAL_TABLET | ORAL | Status: DC | PRN
  Administered 2014-10-08 – 2014-10-13 (×15): 2 via ORAL
  Filled 2014-10-08 (×15): qty 2

## 2014-10-08 MED ORDER — METHOCARBAMOL 500 MG PO TABS
500.0000 mg | ORAL_TABLET | Freq: Four times a day (QID) | ORAL | Status: DC | PRN
  Administered 2014-10-09 – 2014-10-12 (×3): 500 mg via ORAL
  Filled 2014-10-08 (×3): qty 1

## 2014-10-08 MED ORDER — SUCCINYLCHOLINE CHLORIDE 20 MG/ML IJ SOLN
INTRAMUSCULAR | Status: DC | PRN
  Administered 2014-10-08: 100 mg via INTRAVENOUS

## 2014-10-08 MED ORDER — ALUM & MAG HYDROXIDE-SIMETH 200-200-20 MG/5ML PO SUSP: 30.0000 mL | ORAL | Status: DC | PRN

## 2014-10-08 MED ORDER — ONDANSETRON HCL 4 MG PO TABS: 4.0000 mg | ORAL_TABLET | Freq: Four times a day (QID) | ORAL | Status: DC | PRN

## 2014-10-08 MED ORDER — VANCOMYCIN HCL IN DEXTROSE 750-5 MG/150ML-% IV SOLN
750.0000 mg | Freq: Two times a day (BID) | INTRAVENOUS | Status: DC
  Administered 2014-10-09 – 2014-10-11 (×6): 750 mg via INTRAVENOUS
  Filled 2014-10-08 (×6): qty 150

## 2014-10-08 MED ORDER — ASPIRIN EC 325 MG PO TBEC
325.0000 mg | DELAYED_RELEASE_TABLET | Freq: Two times a day (BID) | ORAL | Status: DC
  Administered 2014-10-09 – 2014-10-13 (×9): 325 mg via ORAL
  Filled 2014-10-08 (×11): qty 1

## 2014-10-08 MED ORDER — LIDOCAINE HCL (CARDIAC) 20 MG/ML IV SOLN
INTRAVENOUS | Status: AC
  Filled 2014-10-08: qty 5

## 2014-10-08 MED ORDER — HYDROMORPHONE HCL 1 MG/ML IJ SOLN
INTRAMUSCULAR | Status: AC
Start: 2014-10-08 — End: 2014-10-09
  Filled 2014-10-08: qty 1

## 2014-10-08 MED ORDER — PIPERACILLIN-TAZOBACTAM 3.375 G IVPB 30 MIN
3.3750 g | Freq: Once | INTRAVENOUS | Status: AC
  Administered 2014-10-08: 3.375 g via INTRAVENOUS
  Filled 2014-10-08: qty 50

## 2014-10-08 MED ORDER — STERILE WATER FOR IRRIGATION IR SOLN
Status: DC | PRN
  Administered 2014-10-08: 1500 mL

## 2014-10-08 MED ORDER — HYDROMORPHONE HCL 1 MG/ML IJ SOLN
0.2500 mg | INTRAMUSCULAR | Status: DC | PRN
  Administered 2014-10-08 (×4): 0.5 mg via INTRAVENOUS

## 2014-10-08 MED ORDER — METOCLOPRAMIDE HCL 5 MG/ML IJ SOLN: 5.0000 mg | Freq: Three times a day (TID) | INTRAMUSCULAR | Status: DC | PRN

## 2014-10-08 MED ORDER — ACETAMINOPHEN 325 MG PO TABS
650.0000 mg | ORAL_TABLET | Freq: Four times a day (QID) | ORAL | Status: DC | PRN
  Administered 2014-10-08 – 2014-10-10 (×4): 650 mg via ORAL
  Filled 2014-10-08 (×4): qty 2

## 2014-10-08 MED ORDER — FENTANYL CITRATE (PF) 100 MCG/2ML IJ SOLN
INTRAMUSCULAR | Status: DC | PRN
  Administered 2014-10-08: 50 ug via INTRAVENOUS
  Administered 2014-10-08: 100 ug via INTRAVENOUS
  Administered 2014-10-08: 50 ug via INTRAVENOUS
  Administered 2014-10-08: 100 ug via INTRAVENOUS

## 2014-10-08 MED ORDER — LACTATED RINGERS IV SOLN
INTRAVENOUS | Status: DC | PRN
  Administered 2014-10-08 (×2): via INTRAVENOUS

## 2014-10-08 SURGICAL SUPPLY — 61 items
BAG DECANTER FOR FLEXI CONT (MISCELLANEOUS) ×1 IMPLANT
BAG SPEC THK2 15X12 ZIP CLS (MISCELLANEOUS) ×1
BAG ZIPLOCK 12X15 (MISCELLANEOUS) ×3 IMPLANT
BLADE SAW SGTL 18X1.27X75 (BLADE) ×2 IMPLANT
BLADE SAW SGTL 18X1.27X75MM (BLADE) ×1
BRUSH FEMORAL CANAL (MISCELLANEOUS) IMPLANT
CAPT HIP TOTAL 2 ×2 IMPLANT
DRAPE INCISE IOBAN 85X60 (DRAPES) ×3 IMPLANT
DRAPE ORTHO SPLIT 77X108 STRL (DRAPES) ×6
DRAPE POUCH INSTRU U-SHP 10X18 (DRAPES) ×3 IMPLANT
DRAPE SHEET LG 3/4 BI-LAMINATE (DRAPES) ×4 IMPLANT
DRAPE SURG 17X11 SM STRL (DRAPES) ×3 IMPLANT
DRAPE SURG ORHT 6 SPLT 77X108 (DRAPES) ×2 IMPLANT
DRAPE U-SHAPE 47X51 STRL (DRAPES) ×3 IMPLANT
DRSG AQUACEL AG ADV 3.5X10 (GAUZE/BANDAGES/DRESSINGS) IMPLANT
DRSG AQUACEL AG ADV 3.5X14 (GAUZE/BANDAGES/DRESSINGS) ×2 IMPLANT
DURAPREP 26ML APPLICATOR (WOUND CARE) ×3 IMPLANT
ELECT BLADE TIP CTD 4 INCH (ELECTRODE) ×3 IMPLANT
ELECT REM PT RETURN 9FT ADLT (ELECTROSURGICAL) ×3
ELECTRODE REM PT RTRN 9FT ADLT (ELECTROSURGICAL) ×1 IMPLANT
FACESHIELD WRAPAROUND (MASK) ×12 IMPLANT
FACESHIELD WRAPAROUND OR TEAM (MASK) ×4 IMPLANT
GAUZE SPONGE 2X2 8PLY STRL LF (GAUZE/BANDAGES/DRESSINGS) ×1 IMPLANT
GLOVE BIOGEL PI IND STRL 7.5 (GLOVE) ×1 IMPLANT
GLOVE BIOGEL PI IND STRL 8.5 (GLOVE) ×1 IMPLANT
GLOVE BIOGEL PI INDICATOR 7.5 (GLOVE) ×2
GLOVE BIOGEL PI INDICATOR 8.5 (GLOVE) ×4
GLOVE ECLIPSE 8.0 STRL XLNG CF (GLOVE) ×6 IMPLANT
GOWN SPEC L3 XXLG W/TWL (GOWN DISPOSABLE) ×6 IMPLANT
GOWN STRL REUS W/TWL LRG LVL3 (GOWN DISPOSABLE) ×7 IMPLANT
HANDPIECE INTERPULSE COAX TIP (DISPOSABLE)
KIT BASIN OR (CUSTOM PROCEDURE TRAY) ×3 IMPLANT
LIQUID BAND (GAUZE/BANDAGES/DRESSINGS) ×3 IMPLANT
MANIFOLD NEPTUNE II (INSTRUMENTS) ×3 IMPLANT
NDL SAFETY ECLIPSE 18X1.5 (NEEDLE) ×1 IMPLANT
NEEDLE HYPO 18GX1.5 SHARP (NEEDLE) ×6
NS IRRIG 1000ML POUR BTL (IV SOLUTION) ×6 IMPLANT
PACK TOTAL JOINT (CUSTOM PROCEDURE TRAY) ×3 IMPLANT
PADDING CAST COTTON 6X4 STRL (CAST SUPPLIES) ×3 IMPLANT
PEN SKIN MARKING BROAD (MISCELLANEOUS) ×3 IMPLANT
POSITIONER SURGICAL ARM (MISCELLANEOUS) ×3 IMPLANT
PRESSURIZER FEMORAL UNIV (MISCELLANEOUS) IMPLANT
SCREW CANN 6.5 50MM (Screw) ×3 IMPLANT
SCREW CANN 6.5 FLUT 50X22X (Screw) IMPLANT
SET HNDPC FAN SPRY TIP SCT (DISPOSABLE) IMPLANT
SPONGE GAUZE 2X2 STER 10/PKG (GAUZE/BANDAGES/DRESSINGS)
SPONGE LAP 18X18 X RAY DECT (DISPOSABLE) ×3 IMPLANT
SPONGE LAP 4X18 X RAY DECT (DISPOSABLE) ×1 IMPLANT
STAPLER VISISTAT 35W (STAPLE) ×3 IMPLANT
STEM STR LG STATURE 8IN 15MM (Stem) ×2 IMPLANT
SUCTION FRAZIER TIP 10 FR DISP (SUCTIONS) ×5 IMPLANT
SUT VIC AB 1 CT1 36 (SUTURE) ×9 IMPLANT
SUT VIC AB 2-0 CT1 27 (SUTURE) ×9
SUT VIC AB 2-0 CT1 TAPERPNT 27 (SUTURE) ×3 IMPLANT
SUT VLOC 180 0 24IN GS25 (SUTURE) ×6 IMPLANT
TOWEL OR 17X26 10 PK STRL BLUE (TOWEL DISPOSABLE) ×6 IMPLANT
TOWER CARTRIDGE SMART MIX (DISPOSABLE) IMPLANT
TRAY FOLEY W/METER SILVER 14FR (SET/KITS/TRAYS/PACK) ×1 IMPLANT
TUBE KAMVAC SUCTION (TUBING) IMPLANT
WATER STERILE IRR 1500ML POUR (IV SOLUTION) ×3 IMPLANT
YANKAUER SUCT BULB TIP 10FT TU (MISCELLANEOUS) ×3 IMPLANT

## 2014-10-08 NOTE — Transfer of Care (Signed)
Immediate Anesthesia Transfer of Care Note  Patient: Billy Lopez.  Procedure(s) Performed: Procedure(s): POSTERIOR LEFT TOTAL HIP REVISION (Left)  Patient Location: PACU  Anesthesia Type:General  Level of Consciousness: awake, alert  and oriented  Airway & Oxygen Therapy: Patient Spontanous Breathing and Patient connected to face mask oxygen  Post-op Assessment: Report given to RN and Post -op Vital signs reviewed and stable  Post vital signs: Reviewed and stable  Last Vitals:  Filed Vitals:   10/08/14 0559  BP: 112/83  Pulse: 77  Temp: 36.9 C  Resp: 18    Complications: No apparent anesthesia complications

## 2014-10-08 NOTE — Brief Op Note (Signed)
10/04/2014 - 10/08/2014  10:49 AM  PATIENT:  Billy Lopez.  74 y.o. male  PRE-OPERATIVE DIAGNOSIS:  Failed left total hip replacement  POST-OPERATIVE DIAGNOSIS:   Failed left total hip replacement  PROCEDURE:  Procedure(s): LEFT TOTAL HIP replacement REVISION (Left)  SURGEON:  Surgeon(s) and Role:    * Paralee Cancel, MD - Primary    * Sydnee Cabal, MD - Assisting  PHYSICIAN ASSISTANT: Jerilynn Mages, PA-C  ANESTHESIA:   general  EBL:  Total I/O In: 2000 [I.V.:2000] Out: 1150 [Urine:100; Blood:1050]  BLOOD ADMINISTERED:none  DRAINS: none   LOCAL MEDICATIONS USED:  NONE  SPECIMEN:  No Specimen  DISPOSITION OF SPECIMEN:  N/A  COUNTS:  YES  TOURNIQUET:  * No tourniquets in log *  DICTATION: .Other Dictation: Dictation Number [id number]  PLAN OF CARE: Admit to inpatient   PATIENT DISPOSITION:  PACU - hemodynamically stable.   Delay start of Pharmacological VTE agent (>24hrs) due to surgical blood loss or risk of bleeding: no

## 2014-10-08 NOTE — Progress Notes (Signed)
Patient ID: Billy Lopez., male   DOB: 06/27/1940, 74 y.o.   MRN: [id number]  [facility] PROGRESS NOTE  Billy Lopez. QMV:[id number] DOB: 19-Oct-1940 DOA: 10/26/2014 PCP: [name], MD   Brief narrative:    74 y/o male with PMH of HTN, Substance Abuse, h/o Prostate CA, COPD, CKD, Hepatitis, HIV, h/o L hip arthroplasty presented with mechanical fall and found to have broken L arthoplasty.   Admitted with L hip pian, left hip arthroplasty has broken. Surgery planned 10/08/2014.  Assessment/Plan:    1. Mechanical fall with L hip pian - left hip arthroplasty has broken. Failed left hip surgery. Plan for left total hip replacement revision 5/7, continue to provide analgesia as needed  2. 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 COPD. No wheezing on exam. CXR: no clear infiltrates. Cont inhalers prn  4. HTN. BP is at target range, continue to hold home antihypertensive regimen for now and will reassess post op 5. HIV. Cont home regimen  6. Anemia of chronic disease, HIV - Hg stable at target range, will repeat post op  DVT prophylaxis - SCD's   Code Status: Full.  Family Communication:  plan of care discussed with the patient Disposition Plan: Can not be discharged as surgery planned for today, will need PT post op  IV access:  Peripheral IV  Procedures and diagnostic studies:    Ct Head Wo Contrast  2014-10-26  No acute intracranial abnormality.  No significant change.     Ct Femur Left W Contrast  10-26-2014  Negative for mass lesion. The femoral component of the patient's left hip arthroplasty has broken through the lateral cortex of the femur with the tip protruding into the lateral compartment musculature accounting for the patient's possible mass.     Dg Pelvis Portable  10/26/2014  The femoral component of the left hip arthroplasty extends lateral to the femoral cortex as on CT. No  evidence of acute pelvic fracture.     Chest Portable 1 View  2014-10-26  No active disease.     Dg Hip Unilat With Pelvis 2-3 Views Left Oct 26, 2014  Left total hip arthroplasty with lateral displacement of the femoral stem component from the femur with osteolysis of the lateral cortex of the left proximal femoral diaphysis. There is periprosthetic lucency around the trochanteric component of the arthroplasty consistent with loosening.   Medical Consultants:  Ortho   Other Consultants:  PT/OT  IAnti-Infectives:   None  Faye Ramsay, MD  [facility] Pager [phone]  If 7PM-7AM, please contact night-coverage [email or web address] Password [email or web address] 10/08/2014, 9:28 AM   LOS: 4 days   HPI/Subjective: No events overnight. Still with left hip pain.   Objective: Filed Vitals:   10/07/14 0435 10/07/14 1435 10/07/14 2126 10/08/14 0559  BP: 118/85 131/81 119/77 112/83  Pulse: 86 68 73 77  Temp: 98.8 F (37.1 C) 98.3 F (36.8 C) 98.9 F (37.2 C) 98.4 F (36.9 C)  TempSrc: Oral Oral Oral Oral  Resp: 16 20 20 18   Height:      Weight:      SpO2: 96% 99% 99% 96%    Intake/Output Summary (Last 24 hours) at 10/08/14 0928 Last data filed at 10/08/14 0914  Gross per 24 hour  Intake    480 ml  Output   2825 ml  Net  -2345 ml    Exam:  General:  Pt is alert, follows commands appropriately, not in acute distress  Cardiovascular: Regular rate and rhythm, no rubs, no gallops  Respiratory: Clear to auscultation bilaterally, no wheezing, no crackles, no rhonchi  Abdomen: Soft, non tender, non distended, bowel sounds present, no guarding  Extremities: pulses DP and PT palpable bilaterally  Neuro: Grossly nonfocal  Data Reviewed: Basic Metabolic Panel:  Recent Labs Lab 10/04/14 1046 10/04/14 1751 10/05/14 0455 10/07/14 0415 10/08/14 0415  NA 138  --  138 136 138  K 3.8  --  3.8 3.7 3.9  CL 112*  --  112* 113* 114*  CO2 20*  --  20* 20* 20*  GLUCOSE 119*  --  106* 110* 112*  BUN  23*  --  26* 20 20  CREATININE 1.59* 1.41* 1.37* 1.21 1.18  CALCIUM 9.1  --  8.3* 8.1* 8.5*   CBC:  Recent Labs Lab 10/04/14 1046 10/04/14 1751 10/05/14 0455 10/07/14 0415 10/08/14 0415  WBC 6.4 5.5 4.9 5.9 6.4  NEUTROABS 4.5  --   --   --   --   HGB 12.9* 11.4* 10.8* 10.8* 11.0*  HCT 40.4 36.1* 33.9* 34.3* 35.2*  MCV 87.6 88.0 87.8 87.7 87.1  PLT 282 234 208 201 230   Cardiac Enzymes:  Recent Labs Lab 10/04/14 1046  TROPONINI <0.03   Scheduled Meds: . [MAR Hold] cholecalciferol  1,000 Units Oral QPM  . [MAR Hold] efavirenz-emtricitabine-tenofovir  1 tablet Oral QHS  . [MAR Hold] feeding supplement (ENSURE ENLIVE)  237 mL Oral BID BM  . [MAR Hold] senna-docusate  1 tablet Oral QHS  . [MAR Hold] tamsulosin  0.4 mg Oral BID  . [MAR Hold] vitamin A  10,000 Units Oral QPM  . [MAR Hold] vitamin C  500 mg Oral QPM   Continuous Infusions:

## 2014-10-08 NOTE — Progress Notes (Signed)
Patient ID: Billy Brink., male   DOB: Oct 02, 1940, 73 y.o.   MRN: [id number] To OR today for revision of his failed left total hip replacement Questions encouraged, reviewed  Consent signed  Post op orders and disposition pending intra-operative findings

## 2014-10-08 NOTE — Progress Notes (Signed)
ANTIBIOTIC CONSULT NOTE - INITIAL  Pharmacy Consult for vancomycin and zosyn Indication: rule out sepsis  No Known Allergies  Patient Measurements: Height: 5\' 8"  (172.7 cm) Weight: 164 lb (74.39 kg) IBW/kg (Calculated) : 68.4  Vital Signs: Temp: 102.8 F (39.3 C) (05/07 1735) Temp Source: Oral (05/07 1735) BP: 109/81 mmHg (05/07 1735) Pulse Rate: 105 (05/07 1735) Intake/Output from previous day: 05/06 0701 - 05/07 0700 In: 600 [Lopez.O.:600] Out: 2175 [Urine:2175] Intake/Output from this shift: Total I/O In: -  Out: 150 [Urine:150]  Labs:  Recent Labs  10/07/14 0415 10/08/14 0415 10/08/14 1150  WBC 5.9 6.4  --   HGB 10.8* 11.0* 11.3*  PLT 201 230  --   CREATININE 1.21 1.18  --    Estimated Creatinine Clearance: 54.7 mL/min (by C-G formula based on Cr of 1.18). No results for input(s): VANCOTROUGH, VANCOPEAK, VANCORANDOM, GENTTROUGH, GENTPEAK, GENTRANDOM, TOBRATROUGH, TOBRAPEAK, TOBRARND, AMIKACINPEAK, AMIKACINTROU, AMIKACIN in the last 72 hours.   Microbiology: No results found for this or any previous visit (from the past 720 hour(s)).  Medical History: Past Medical History  Diagnosis Date  . Hypertension   . HIV infection   . Substance abuse   . 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 Allergic rhinitis, cause unspecified 07/21/2011  . Cervical spondylosis 07/21/2011  . Lumbar spondylosis 07/21/2011  . Osteoarthritis 07/21/2011  . COPD (chronic obstructive pulmonary disease) 07/21/2011  . 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 Hx of radiation therapy 12/17/11 - 02/13/12    prostate  . Prostate cancer 09/19/11    gleason 7  . H/O blood clots   . Pneumonia   . GERD (gastroesophageal reflux disease)   . Hepatitis     Hep C  . Rectal bleeding   . Wears dentures     top  . Wears glasses     Assessment: Patient's a  74 y.o M with hx HTN, COPD, prostate CA, hepatitis, HIV, and left hip arthroplasty who presented to the ED on 5/3 s/Lopez fall and was found to have a  broken L  arthroplasty. He had a left total hip replacement revision on 5/7.  Patient is now febrile post OR with Tmax 102.8.  To start vancomycin and zosyn for suspected sepsis.   Goal of Therapy:  Vancomycin trough level 15-20 mcg/ml  Plan:  - vancomycin 1gm IV and zosyn 3.375gm IV over 30 minutes x1 (per MD), then vancomycin 750mg  IV q12h and zosyn 3.375 gm IV q8h (infuse over 4 hours) - will d/c post-op ancef since patient is now on broad abx  Billy Lopez 10/08/2014,7:38 PM

## 2014-10-08 NOTE — Anesthesia Procedure Notes (Signed)
Procedure Name: Intubation Date/Time: 10/08/2014 8:04 AM Performed by: [place] Simona Rocque C Pre-anesthesia Checklist: Emergency Drugs available, Patient identified, Timeout performed, Suction available and Patient being monitored Patient Re-evaluated:Patient Re-evaluated prior to inductionOxygen Delivery Method: Circle system utilized Preoxygenation: Pre-oxygenation with 100% oxygen Intubation Type: IV induction Ventilation: Mask ventilation without difficulty Laryngoscope Size: Mac and 4 Grade View: Grade I Tube type: Oral Tube size: 7.5 mm Number of attempts: 1 Airway Equipment and Method: Stylet Placement Confirmation: ETT inserted through vocal cords under direct vision,  breath sounds checked- equal and bilateral,  positive ETCO2 and CO2 detector Secured at: 21 cm Tube secured with: Tape Dental Injury: Teeth and Oropharynx as per pre-operative assessment

## 2014-10-08 NOTE — Progress Notes (Signed)
CRITICAL VALUE ALERT  Critical value received:  Lactic Acid 2.8  Date of notification:  10/08/2014  Time of notification:  2218  Critical value read back:yes  Nurse who received alert:  GMCrutchfield, RN BSN  MD notified (1st page):  Larence Penning, NP  Time of first page:  2220  MD notified (2nd page):  Time of second page:  Responding MD:  Larence Penning, NP  Time MD responded:  2221

## 2014-10-08 NOTE — H&P (View-Only) (Signed)
Patient ID: Billy Brink., male   DOB: 1941/04/07, 74 y.o.   MRN: [id number]  Failed left hip surgery NPO after MN for surgery tomorrow Consent ordered  Labs reviewed and stable though hgb 10  Surgery tomorrow am at 7:30

## 2014-10-08 NOTE — Op Note (Signed)
NAMEMarland Lopez  RACE, LATOUR NO.:  [id number]  MEDICAL RECORD NO.:  [id number]  LOCATION:  101                         FACILITY:  [facility]  PHYSICIAN:  Pietro Cassis. Alvan Dame, M.D.  DATE OF BIRTH:  14-Feb-1941  DATE OF PROCEDURE:  10/08/2014 DATE OF DISCHARGE:                              OPERATIVE REPORT   PREOPERATIVE DIAGNOSIS:  Failed left total hip arthroplasty.  POSTOPERATIVE DIAGNOSIS:  Failed left total hip arthroplasty.  PROCEDURE:  Revision of left total hip arthroplasty utilizing DePuy components size 60 mm Gription cup with a 36+ 4 neutral AltrX liner, a size 8 inch straight Solution large stature stem with a 36+ 5 Delta ceramic ball.  SURGEON:  Pietro Cassis. Alvan Dame, M.D.  ASSISTANT:  Wyatt Portela, PA-C.  Note that Billy Lopez was present for the entirety of the case from preoperative position, perioperative management of the upper extremity, general facilitation of the case, and primary wound closure.  ANESTHESIA:  General.  SPECIMENS:  None.  COMPLICATIONS:  None apparent.  BLOOD LOSS:  About 1100 mL.  DRAINS:  None.  INDICATIONS FOR PROCEDURE:  Billy Lopez is a 74 year old male, who was admitted after a fall.  Radiographs revealed an abnormal appearing left total hip arthroplasty.  Review of the radiographs indicated his femoral stem was outside his femoral canal.  It appeared that it had been that way for some time based on sclerosis noted within the canal of the femur.  Nonetheless, he had increasing pain and dysfunction.  He was admitted to the hospital.  I reviewed with him the need for surgery.  He understood the risks of infection, DVT, component failure, need for future revision surgery.  He is a high risky complicated patient based on HIV status as well as hepatitis B and C, recorded in his history.  Consent was obtained for benefit of pain relief.  PROCEDURE IN DETAIL:  The patient was brought to the operative theater. Once adequate  anesthesia, preoperative antibiotics, Ancef administered. He was positioned into the right lateral decubitus position with left side up.  The left lower extremity was then prepped and draped in sterile fashion.  Time-out was performed identifying the patient, planned procedure, and extremity.  The old incision was identified and utilized and extended distally for exposure purposes.  The iliotibial band and gluteal fascia were exposed for posterior approach to the hip and then incised.  Posterior aspect of the hip was exposed and taken down to the pseudocapsule of the posterior aspect the hip encountering clear synovial fluid.  Following initial exposure of the posterior aspect of the hip including debridement scar, the hip was internally rotated and the femoral head and stem noted to be excessively loose as anticipated based on his radiographs.  With the hip flexed and internally rotated, I was able to remove the femoral stem with the ahead in continuity without complication.  Gross examination and inspection of the proximal femur indicated the femur was intact as I was suspecting radiographically.  Following some more exposure as well as removing fibrous bone from the canal of the femur, I began my work on the femoral side.  He had this obvious sclerotic line indicating the  varus position of his initial stem.  His stem was placed 72 years ago.  After several attempts at passing a sharp long drill bit from the [facility] cement set, brought x- ray in for radiographic evaluation and assessment.  The sclerosis was obviously more than what the drills could handle as it continued to kick the drill bit out laterally.  Ultimately using osteotomes, I removed some of the sclerotic bone off the medial aspect of the proximal femur to allow for better orientation of these guides.  I was ultimately able to get the [employer] cemented drills past this sclerotic area into the shaft of the femur into  the isthmus of the femur.  I confirmed this radiographically.  Once this was confirmed radiographically, I began reaming with 8-mm AML reamer and reamed up to a 14.5 by 0.5 mm increments with excellent bone purchase at the isthmus beyond the site of the exiting of the old stem.  I then hand reamed with a 15 to make certain I was able to have this stem well seated and not too proud based on the excessive preoperative shortening he had noted.  Once the femur was prepared, I packed it with a sponge and attended to the acetabulum.  The patient had an old [place] medical system and we were unable to locate any service for this cup, I thus decided to revise the acetabulum as well, as opposed to cementing in a liner into the existing shell.  Following further exposure of the acetabulum, retractors were placed.  I was able to remove the plastic liner removing 2 cancellous screws in the ilium. I then replaced the liner and used the Inn[employer] explant system and used a 56 starting and then finishing explant system and removed the acetabular shell with minimal bone loss.  I then began reaming with a 54 reamer and then 55 up to 59-mm reamer.  The bone was fairly thin shallow.  He had previously posterior wall was reamed very thin, but the posterior column was intact as was the superior ilium and anterior column.  I selected a 60-mm Gription cup and impacted this at about 40 degrees of abduction and 20 degrees of forward flexion.  Two cancellous screws were placed in the ilium and a final 36+ 4 neutral AltrX liner was impacted.  At this point, I did not perform a trial reduction.  I then selected the size 15, 8 inch large statured Solution stem, it was impacted with about 20- 25 degrees of anteversion in the femur.  Once I had the center of the femoral head near the tip of the trochanter, we completed the impaction and did trial reduction first with a 36+ 1.5 and a 36+ 5 ball, with this I found the hip  to be stable.  Leg lengths seemed to be restored on the exam table compared to his down leg.  He had a previous right total hip arthroplasty as well.  Final radiographs were obtained with the final stem in place to make certain that it bypassed the exiting site adequately.  I was happy with the overall reduction.  Following the placement of the final 36+ 5 Delta ceramic ball onto a clean and dry trunnion, the hip was irrigated.  The iliotibial band was reapproximated using #1 Vicryl and 0 V-Loc suture.  The remainder wound was closed with 2-0 Vicryl and staples on the skin.  The skin was then cleaned, dried, and dressed sterilely using a Mepilex dressing.  He was then brought  to the recovery room, extubated, in stable condition, tolerating the procedure well.  He will be partial weightbearing for probably 6-8 weeks to allow for bone ingrowth.  posterior hip precautions will be ordered.     Pietro Cassis Alvan Dame, M.D.     MDO/MEDQ  D:  10/08/2014  T:  10/08/2014  Job:  [id number]

## 2014-10-08 NOTE — Interval H&P Note (Signed)
History and Physical Interval Note:  10/08/2014 7:31 AM  Billy Lopez.  has presented today for surgery, with the diagnosis of failed left hip  The various methods of treatment have been discussed with the patient and family. After consideration of risks, benefits and other options for treatment, the patient has consented to  Procedure(s): POSTERIOR LEFT TOTAL HIP REVISION (Left) as a surgical intervention .  The patient's history has been reviewed, patient examined, no change in status, stable for surgery.  I have reviewed the patient's chart and labs.  Questions were answered to the patient's satisfaction.     Mauri Pole

## 2014-10-08 NOTE — Progress Notes (Signed)
Pt temp was 101.5. Per orders, tylenol was given at 1715. Temp rechecked at 1820 and it was 102.8. Paged Dr. Doyle Askew. Awaiting orders.

## 2014-10-09 ENCOUNTER — Encounter (HOSPITAL_COMMUNITY): Payer: Self-pay | Admitting: Radiology

## 2014-10-09 ENCOUNTER — Inpatient Hospital Stay (HOSPITAL_COMMUNITY): Payer: Commercial Managed Care - HMO

## 2014-10-09 LAB — CBC
HCT: 23.8 % — ABNORMAL LOW (ref 39.0–52.0)
Hemoglobin: 7.7 g/dL — ABNORMAL LOW (ref 13.0–17.0)
MCH: 28.2 pg (ref 26.0–34.0)
MCHC: 32.4 g/dL (ref 30.0–36.0)
MCV: 87.2 fL (ref 78.0–100.0)
Platelets: 174 10*3/uL (ref 150–400)
RBC: 2.73 MIL/uL — ABNORMAL LOW (ref 4.22–5.81)
RDW: 14.4 % (ref 11.5–15.5)
WBC: 9.9 10*3/uL (ref 4.0–10.5)

## 2014-10-09 LAB — BASIC METABOLIC PANEL
Anion gap: 4 — ABNORMAL LOW (ref 5–15)
BUN: 16 mg/dL (ref 6–20)
CHLORIDE: 110 mmol/L (ref 101–111)
CO2: 21 mmol/L — ABNORMAL LOW (ref 22–32)
Calcium: 7.7 mg/dL — ABNORMAL LOW (ref 8.9–10.3)
Creatinine, Ser: 1.2 mg/dL (ref 0.61–1.24)
GFR calc Af Amer: >60 mL/min (ref >60)
GFR calc non Af Amer: 59 mL/min — ABNORMAL LOW (ref >60)
GLUCOSE: 136 mg/dL — AB (ref 70–99)
Potassium: 4.2 mmol/L (ref 3.5–5.1)
SODIUM: 135 mmol/L (ref 135–145)

## 2014-10-09 LAB — URINE CULTURE
COLONY COUNT: NO GROWTH
Culture: NO GROWTH

## 2014-10-09 LAB — LACTIC ACID, PLASMA
Lactic Acid, Venous: 1.9 mmol/L (ref 0.5–2.0)
Lactic Acid, Venous: 1.7 mmol/L (ref 0.5–2.0)

## 2014-10-09 LAB — PREPARE RBC (CROSSMATCH)

## 2014-10-09 LAB — TROPONIN I
TROPONIN I: 0.03 ng/mL (ref <0.031)
TROPONIN I: 0.03 ng/mL (ref <0.031)

## 2014-10-09 MED ORDER — IOHEXOL 350 MG/ML SOLN
100.0000 mL | Freq: Once | INTRAVENOUS | Status: AC | PRN
  Administered 2014-10-09: 100 mL via INTRAVENOUS

## 2014-10-09 MED ORDER — SODIUM CHLORIDE 0.9 % IV SOLN
Freq: Once | INTRAVENOUS | Status: AC
  Administered 2014-10-09: 16:00:00 via INTRAVENOUS

## 2014-10-09 MED ORDER — SODIUM CHLORIDE 0.9 % IV SOLN
INTRAVENOUS | Status: DC
  Administered 2014-10-10 – 2014-10-13 (×5): via INTRAVENOUS

## 2014-10-09 NOTE — Progress Notes (Signed)
Reviewed EKG results from 0230. Noted significant changes when compared to prior EKGs. Made Larence Penning, NP aware who ordered Serum Troponins. Pt has not c/o cp, sob.

## 2014-10-09 NOTE — Clinical Social Work Note (Signed)
CSW met with pt at bedside to provide SNF options  CSW provided information to pt regarding the 3 facilities that can accommodate pt for rehab at discharge  CSW encouraged pt to discuss any questions he had and also to make a decision about which SNF he will want at discharge  Pt stated that he cannot make a decision today and will have to wait until Tuesday to make that decision  CSW explained that insurance authorization must be obtained and also pt may be leaving before Tuesday but pt unable to make a decision at this time   CSW met with RN to assess if pt may be ready tomorrow for discharge and RN stated "no pt had no progress today so he will not be discharging tomorrow"  CSW called and spoke with insurance company/ [employer] to obtain authorization for pt SNF.  Information was provided regarding pt name and room number  [employer] stated they could look pt up and did not need any paperwork faxed over but they were unwilling to give authorization unless a specific SNF is named.  They stated they would document this information but week day CSW will need to call back with a specific SNF to gain the authorization.  Dede Query, LCSW [place] Worker - Weekend Coverage cell #: [phone]

## 2014-10-09 NOTE — Progress Notes (Signed)
Pt. Defers therapy today stating he is too tired.

## 2014-10-09 NOTE — Clinical Social Work Note (Signed)
Clinical Social Work Assessment  Patient Details  Name: Billy Lopez. MRN: [id number] Date of Birth: 15-Apr-1941  Date of referral:  10/09/14               Reason for consult:  Facility Placement (From [facility])                Permission sought to share information with:  Customer service manager (None.) Permission granted to share information::  No, Yes, Verbal Permission Granted  Name::        Agency::     Relationship::     Contact Information:     Housing/Transportation Living arrangements for the past 2 months:  Apartment Source of Information:  Patient Patient Interpreter Needed:  None Criminal Activity/Legal Involvement Pertinent to Current Situation/Hospitalization:  No - Comment as needed Significant Relationships:  Friend (Patient states that his friends in the community come to visit him daily. He states that his friend Shanon Brow is supportive. Shanon Brow [phone]) Lives with:  Self Do you feel safe going back to the place where you live?  Yes (Pt reports that he currently receives home health. He states that they come x7 a week for 5 hours.) Need for family participation in patient care:  Yes (Comment) (Patient states that friends are his primary support. )  Care giving concerns:  Pt has no care givers   Social Worker assessment / plan:  CSW met with pt at bedside to discuss SNF bed options.  CSW provided pt with the 3 SNF's that said they could accommodate pt for his rehab needs.  CSW explained process for insurance authorization and encouraged pt to discuss any questions and concerns as well as decide about a SNF placement.  CSW called for insurance authorization but they need a specific SNF to give authoirzation  Employment status:  Retired Forensic scientist:   [employer].) PT Recommendations:   (Pt states that he currently has home health. He states that if he needs Physical Therapy he would like to receive the service at his home. ) Information /  Referral to community resources:     Patient/Family's Response to care:  Pt appears to be confused with making a decision stating that he needs to wait until Tuesday when his friend comes to town.  Pt very grateful for CSW help and stated again that he could not make a decision on which SNF at this time  Patient/Family's Understanding of and Emotional Response to Diagnosis, Current Treatment, and Prognosis:  Pt appears to understand that he needs a SNF bed at discharge  Emotional Assessment Appearance:  Appears stated age Attitude/Demeanor/Rapport:   (Appropriate.) Affect (typically observed):  Accepting, Appropriate Orientation:  Oriented to Self, Oriented to Place, Oriented to  Time, Oriented to Situation Alcohol / Substance use:  Never Used Psych involvement (Current and /or in the community):  No (Comment)  Discharge Needs  Concerns to be addressed:  Adjustment to Illness Readmission within the last 30 days:  No Current discharge risk:  None Barriers to Discharge:  No Barriers Identified   Carlean Jews, LCSW 10/09/2014, 4:51 PM

## 2014-10-09 NOTE — Progress Notes (Addendum)
Patient ID: Billy Lopez., male   DOB: 1941-01-09, 74 y.o.   MRN: [id number]  [facility] PROGRESS NOTE  Billy Lopez. [id number]:[id number] DOB: Feb 01, 1941 DOA: Oct 14, 2014 PCP: [name], MD   Brief narrative:    74 y/o male with PMH of HTN, Substance Abuse, h/o Prostate CA, COPD, CKD, Hepatitis, HIV, h/o L hip arthroplasty presented with mechanical fall and found to have broken L arthoplasty.   Admitted with L hip pian, left hip arthroplasty has broken. Surgery planned 10/08/2014.  Assessment/Plan:    1. Mechanical fall with L hip pian - left hip arthroplasty has broken. Failed left hip surgery. 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 Management per ortho team. Continue aspirin 325 mg PO BID for DVT prophylaxis.   2. SEPSIS, 5/7 - with post op fever up to 102.52F, HR up to 126 bpm, RR 24, SBP 89. Unclear etiology, sepsis work up in prrogress since 5/7 post op. CXR with no sings of an infectious etiology, UA with trace leukocytes which were not present pre op, so UTI is consideration. Pt started on vancomycin and zosyn 5/7, today is day #2, will continue same regimen for now until more data is back so that we can narrow down ABX regimen. Please note that lactic acid is now WNL.  3. Acute Hypoxic respiratory failure, O2 sat 83% on RA - in pt with known COPD, no wheezing on exam this AM but certainly worrisome for possible PE post op. Also possible from acute blood loss anemia (post op Hg drop from 11.3 --> 7.7 this AM). CXR with no sings of acute etiology. Will ask for CT chest angio for further evaluation. Continue BD's as needed. Blood transfusion requested as noted below.   4. 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 Cr is now WNL, repeat BMP in AM.   5. HTN. BP on low end of normal. Continue to hold  Enalapril and HCTZ until BP stabilizes.   6. HIV. Cont home regimen   7. Anemia of chronic disease of HIV imposed on acute blood loss anemia, post op- Hg drop post op, from 11.3 --> 7.7 this AM, will transfuse 2 U PRBC today 5/8, repeat CBC in AM.   8. Chest tightness - likely demand ischemia from acute blood loss anemia and possible sepsis, cycle CE's, obtain 12 lead EKG. Denies chest pain this AM. If pt develops chest pain, may need to transfer to telemetry unit.   DVT prophylaxis - SCD's, aspirin 325 mg PO BID per ortho team recommendations   Code Status: Full.  Family Communication:  plan of care discussed with the patient Disposition Plan: Barrier to discharge --> sepsis work up in progress, also needs blood transfusion today 5/8.   IV access:  Peripheral IV  Procedures and diagnostic studies:    Ct Head Wo Contrast  Oct 14, 2014  No acute intracranial abnormality.  No significant change.     Ct Femur Left W Contrast  14-Oct-2014  Negative for mass lesion. The femoral component of the patient's left hip arthroplasty has broken through the lateral cortex of the femur with the tip protruding into the lateral compartment musculature accounting for the patient's possible mass.     Dg Pelvis Portable  Oct 14, 2014  The femoral component of the left hip  arthroplasty extends lateral to the femoral cortex as on CT. No evidence of acute pelvic fracture.     Chest Portable 1 View  10/04/2014  No active disease.     Dg Chest Port 1 View  10/08/2014   No acute abnormality seen.     Dg Hip Port Unilat With Pelvis 1v Left  10/08/2014 Satisfactory postoperative appearance of a left total hip prosthesis without inclusion of the inferior portion of the femoral component.     Medical Consultants:  Ortho   Other Consultants:  PT/OT  IAnti-Infectives:   Vancomycin 5/7 --> Zosyn 5/7 -->  Faye Ramsay, MD  [facility] Pager [phone]  If 7PM-7AM, please contact night-coverage [email or web address] Password  [email or web address] 10/09/2014, 2:21 PM   LOS: 5 days   HPI/Subjective: No events overnight. Still with left hip pain. 7/10 in severity, fevers.   Objective: Filed Vitals:   10/09/14 0208 10/09/14 0607 10/09/14 1044 10/09/14 1402  BP: 104/66 110/74 115/73 110/71  Pulse: 96 87 93 97  Temp: 100 F (37.8 C) 100.5 F (38.1 C) 99.7 F (37.6 C) 100 F (37.8 C)  TempSrc: Oral Oral Oral Oral  Resp: 17 16 18 16   Height:      Weight:      SpO2: 100% 100% 100% 100%    Intake/Output Summary (Last 24 hours) at 10/09/14 1421 Last data filed at 10/09/14 1403  Gross per 24 hour  Intake 2407.08 ml  Output   2950 ml  Net -542.92 ml    Exam:   General:  Pt is alert, follows commands appropriately, not in acute distress  Cardiovascular: Regular rate and rhythm, no rubs, no gallops  Respiratory: Clear to auscultation bilaterally, no wheezing, diminished breath sounds at bases   Abdomen: Soft, non tender, non distended, bowel sounds present, no guarding  Extremities: pulses DP and PT palpable bilaterally, TTP in the left hip area   Neuro: Grossly nonfocal  Data Reviewed: Basic Metabolic Panel:  Recent Labs Lab 10/04/14 1046 10/04/14 1751 10/05/14 0455 10/07/14 0415 10/08/14 0415 10/09/14 0645  NA 138  --  138 136 138 135  K 3.8  --  3.8 3.7 3.9 4.2  CL 112*  --  112* 113* 114* 110  CO2 20*  --  20* 20* 20* 21*  GLUCOSE 119*  --  106* 110* 112* 136*  BUN 23*  --  26* 20 20 16   CREATININE 1.59* 1.41* 1.37* 1.21 1.18 1.20  CALCIUM 9.1  --  8.3* 8.1* 8.5* 7.7*   CBC:  Recent Labs Lab 10/04/14 1046 10/04/14 1751 10/05/14 0455 10/07/14 0415 10/08/14 0415 10/08/14 1150 10/09/14 0645  WBC 6.4 5.5 4.9 5.9 6.4  --  9.9  NEUTROABS 4.5  --   --   --   --   --   --   HGB 12.9* 11.4* 10.8* 10.8* 11.0* 11.3* 7.7*  HCT 40.4 36.1* 33.9* 34.3* 35.2* 35.4* 23.8*  MCV 87.6 88.0 87.8 87.7 87.1  --  87.2  PLT 282 234 208 201 230  --  174   Cardiac Enzymes:  Recent Labs Lab 10/04/14 1046  10/09/14 0645 10/09/14 1210  TROPONINI <0.03 0.03 <0.03   Scheduled Meds: . aspirin EC  325 mg Oral BID  . cholecalciferol  1,000 Units Oral QPM  . docusate sodium  100 mg Oral BID  . efavirenz-emtricitabine-tenofovir  1 tablet Oral QHS  . feeding supplement (ENSURE ENLIVE)  237 mL Oral BID BM  . ferrous sulfate  325 mg Oral TID  PC  . piperacillin-tazobactam (ZOSYN)  IV  3.375 g Intravenous Q8H  . senna-docusate  1 tablet Oral QHS  . tamsulosin  0.4 mg Oral BID  . vancomycin  750 mg Intravenous Q12H  . vitamin A  10,000 Units Oral QPM  . vitamin C  500 mg Oral QPM   Continuous Infusions: . sodium chloride 0.9 % 1,000 mL with potassium chloride 10 mEq infusion 75 mL/hr at 10/08/14 1617

## 2014-10-09 NOTE — Progress Notes (Signed)
   Subjective: 1 Day Post-Op Procedure(s) (LRB): POSTERIOR LEFT TOTAL HIP REVISION (Left)  Pt had a fever yesterday and currently under going work up  EKG changes noted and troponins currently being drawn C/o mild pain/soreness in the left hip today  Nursing staff had to reinforce dressing Otherwise stable Patient reports pain as mild.  Objective:   VITALS:   Filed Vitals:   10/09/14 0607  BP: 110/74  Pulse: 87  Temp: 100.5 F (38.1 C)  Resp: 16    Left hip dressing intact nv intact distally No rashes or edema Knee immobilizer in place  LABS  Recent Labs  10/07/14 0415 10/08/14 0415 10/08/14 1150  HGB 10.8* 11.0* 11.3*  HCT 34.3* 35.2* 35.4*  WBC 5.9 6.4  --   PLT 201 230  --      Recent Labs  10/07/14 0415 10/08/14 0415  NA 136 138  K 3.7 3.9  BUN 20 20  CREATININE 1.21 1.18  GLUCOSE 110* 112*     Assessment/Plan: 1 Day Post-Op Procedure(s) (LRB): POSTERIOR LEFT TOTAL HIP REVISION (Left) PT/OT Continue with fever workup but also recommend spirometry Pain control as needed    Merla Riches, MPAS, PA-C  10/09/2014, 6:54 AM

## 2014-10-09 NOTE — Progress Notes (Signed)
PT Cancellation Note  Patient Details Name: Billy Lopez. MRN: [id number] DOB: July 12, 1940   Cancelled Treatment:    Reason Eval/Treat Not Completed: Other (comment); per RN, pt told medical MD that he did not want to do therapy today; Texted Dr. Doyle Askew and she confirmed to try PT tomorrow; Pt Hgb 7.7, Troponin checked--not elevated, "EKG changes" per RN note but pt without CP, running fever;  Will see next day or as schedule permits.   [facility] 10/09/2014, 10:11 AM

## 2014-10-10 ENCOUNTER — Encounter (HOSPITAL_COMMUNITY): Payer: Self-pay | Admitting: Orthopedic Surgery

## 2014-10-10 LAB — BASIC METABOLIC PANEL
ANION GAP: 8 (ref 5–15)
BUN: 15 mg/dL (ref 6–20)
CALCIUM: 7.8 mg/dL — AB (ref 8.9–10.3)
CO2: 19 mmol/L — AB (ref 22–32)
CREATININE: 1.17 mg/dL (ref 0.61–1.24)
Chloride: 110 mmol/L (ref 101–111)
GFR calc Af Amer: >60 mL/min (ref >60)
GLUCOSE: 123 mg/dL — AB (ref 70–99)
Potassium: 3.7 mmol/L (ref 3.5–5.1)
Sodium: 137 mmol/L (ref 135–145)

## 2014-10-10 LAB — CBC
HEMATOCRIT: 28.6 % — AB (ref 39.0–52.0)
Hemoglobin: 9.6 g/dL — ABNORMAL LOW (ref 13.0–17.0)
MCH: 28.9 pg (ref 26.0–34.0)
MCHC: 33.6 g/dL (ref 30.0–36.0)
MCV: 86.1 fL (ref 78.0–100.0)
Platelets: 149 10*3/uL — ABNORMAL LOW (ref 150–400)
RBC: 3.32 MIL/uL — ABNORMAL LOW (ref 4.22–5.81)
RDW: 14.3 % (ref 11.5–15.5)
WBC: 15.7 10*3/uL — ABNORMAL HIGH (ref 4.0–10.5)

## 2014-10-10 NOTE — Progress Notes (Signed)
Patient ID: Billy Brink., male   DOB: Oct 23, 1940, 74 y.o.   MRN: [id number] Subjective: 2 Days Post-Op Procedure(s) (LRB): POSTERIOR LEFT TOTAL HIP REVISION (Left)    Patient reports pain as mild.  Doing well, no events.  Objective:   VITALS:   Filed Vitals:   10/10/14 1515  BP: 119/70  Pulse: 53  Temp: 99.9 F (37.7 C)  Resp: 18    Neurovascular intact Incision: dressing C/D/I  LABS  Recent Labs  10/08/14 0415 10/08/14 1150 10/09/14 0645 10/10/14 0728  HGB 11.0* 11.3* 7.7* 9.6*  HCT 35.2* 35.4* 23.8* 28.6*  WBC 6.4  --  9.9 15.7*  PLT 230  --  174 149*     Recent Labs  10/08/14 0415 10/09/14 0645 10/10/14 0728  NA 138 135 137  K 3.9 4.2 3.7  BUN 20 16 15   CREATININE 1.18 1.20 1.17  GLUCOSE 112* 136* 123*     Recent Labs  10/08/14 2000  INR 1.22     Assessment/Plan: 2 Days Post-Op Procedure(s) (LRB): POSTERIOR LEFT TOTAL HIP REVISION (Left)   Advance diet Up with therapy Discharge to SNF when medically stable  PWB left lower extremity Posterior hip precautions

## 2014-10-10 NOTE — Evaluation (Addendum)
Physical Therapy Evaluation Patient Details Name: Adoni Greenough. MRN: [id number] DOB: Apr 14, 1941 Today's Date: 10/10/2014   History of Present Illness  74 yo male admittee with periprosthetic fx. s/p L THA rev 10/08/14. Hx of HIV, COPD, Hep C, Hep B.   Clinical Impression  On eval, pt required Mod assist for mobility-able to walk ~25 feet with RW. Limited by pain and fatigue however pt is very motivated to progress. Recommend ST rehab at SNF.     Follow Up Recommendations SNF    Equipment Recommendations  None recommended by PT    Recommendations for Other Services OT consult     Precautions / Restrictions Precautions Precautions: Fall; Hip (posterior) Required Braces or Orthoses:  (KI in place. ) Restrictions Weight Bearing Restrictions: Yes LLE Weight Bearing: Partial weight bearing      Mobility  Bed Mobility Overal bed mobility: Needs Assistance Bed Mobility: Supine to Sit     Supine to sit: Mod assist;HOB elevated     General bed mobility comments: Assist for L LE and small amount to support trunk. Multimodal cues for safety, technique. Pt tends to move quickly. Utilized bedpad to aid with scooting, positioning.   Transfers Overall transfer level: Needs assistance Equipment used: Rolling walker (2 wheeled) Transfers: Sit to/from Stand Sit to Stand: Min assist;From elevated surface         General transfer comment: Assist to rise, stabilize, control descent. VCs safety, technique, hand placement.   Ambulation/Gait   Ambulation Distance (Feet): 25 Feet Assistive device: Rolling walker (2 wheeled) Gait Pattern/deviations: Step-to pattern;Trunk flexed;Antalgic     General Gait Details: VCs safety, technique, sequence, adherence to PWB status. Dyspnea 3/4 with ambulation. Pt fatigues fairly quickly. Pain rated as 9/10.   Stairs            Wheelchair Mobility    Modified Rankin (Stroke Patients Only)       Balance                                              Pertinent Vitals/Pain Pain Assessment: 0-10 Pain Score: 9  Pain Location: L LE with activity Pain Descriptors / Indicators: Aching;Sore Pain Intervention(s): Limited activity within patient's tolerance;Repositioned;Patient requesting pain meds-RN notified;Ice applied    Home Living Family/patient expects to be discharged to:: Private residence Living Arrangements: Alone   Type of Home: Apartment Home Access: Elaine: One level Home Equipment: Environmental consultant - 2 wheels;Wheelchair - Passenger transport manager - single point      Prior Function Level of Independence: Independent               Hand Dominance        Extremity/Trunk Assessment   Upper Extremity Assessment: Overall WFL for tasks assessed           Lower Extremity Assessment: LLE deficits/detail   LLE Deficits / Details: moves ankle well.   Cervical / Trunk Assessment: Normal  Communication   Communication: No difficulties  Cognition Arousal/Alertness: Awake/alert Behavior During Therapy: WFL for tasks assessed/performed Overall Cognitive Status: Within Functional Limits for tasks assessed                      General Comments      Exercises        Assessment/Plan    PT Assessment Patient needs continued  PT services  PT Diagnosis Difficulty walking;Acute pain   PT Problem List Decreased strength;Decreased range of motion;Decreased activity tolerance;Decreased balance;Decreased mobility;Decreased knowledge of use of DME;Pain;Decreased knowledge of precautions  PT Treatment Interventions DME instruction;Gait training;Functional mobility training;Therapeutic activities;Therapeutic exercise;Patient/family education;Balance training   PT Goals (Current goals can be found in the Care Plan section) Acute Rehab PT Goals Patient Stated Goal: to regain mobility and independence PT Goal Formulation: With patient Time For Goal Achievement:  10/17/14 Potential to Achieve Goals: Good    Frequency Min 3X/week   Barriers to discharge        Co-evaluation               End of Session Equipment Utilized During Treatment: Gait belt Activity Tolerance: Patient limited by fatigue;Patient limited by pain Patient left: in chair;with call bell/phone within reach           Time: 0916-0935 PT Time Calculation (min) (ACUTE ONLY): 19 min   Charges:   PT Evaluation $Initial PT Evaluation Tier I: 1 Procedure     PT G Codes:        Weston Anna, MPT Pager: [phone]

## 2014-10-10 NOTE — Progress Notes (Signed)
Patient ID: Billy Brink., male   DOB: [birth year], 74 y.o.   MRN: [id number]  [facility] PROGRESS NOTE  Billy Brink. EQA:[id number] DOB: November 04, 1940 DOA: 10/16/2014 PCP: [name], MD   Brief narrative:    74 y/o male with PMH of HTN, Substance Abuse, h/o Prostate CA, COPD, CKD, Hepatitis, HIV, h/o L hip arthroplasty presented with mechanical fall and found to have broken L arthoplasty.   Admitted with L hip pian, left hip arthroplasty has broken. Surgery planned 10/08/2014.  Assessment/Plan:    1. Mechanical fall with L hip pian - left hip arthroplasty has broken. Failed left hip surgery. 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 Continue to provide analgesia as needed. Management per ortho team. Continue aspirin 325 mg PO BID for DVT prophylaxis.   2. SEPSIS, 5/7 - with post op fever up to 102.38F, HR up to 126 bpm, RR 24, SBP 89. Unclear etiology, sepsis work up in progress since 5/7 post op. CXR with no sings of an infectious etiology. - Patient currently on broad-spectrum antibiotic urine culture reporting no growth. Blood culture reports no growth to date   3. Acute Hypoxic respiratory failure, O2 sat 83% on RA -  - Ct angiogram reports no evidence of pulmonary embolism - 5 mm left lower lobe nodule  4. 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 Cr is now WNL, repeat BMP in AM.   5. HTN. BP on low end of normal. Will continue to hold Enalapril and HCTZ until BP stabilizes.   6. HIV. Cont home regimen   7. Anemia of chronic disease of HIV imposed on acute blood loss anemia, post op- Hg drop post op, from 11.3 --> patient required transfusion. Last hgb 9.6  8. Chest tightness - No chest tightness of pain reported today.  Cardiac enzymes negative  DVT prophylaxis - SCD's, aspirin 325 mg PO BID per ortho team recommendations   Code Status: Full.  Family  Communication:  plan of care discussed with the patient Disposition Plan: Barrier to discharge --> sepsis work up  IV access:  Peripheral IV  Procedures and diagnostic studies:    Ct Head Wo Contrast  Oct 16, 2014  No acute intracranial abnormality.  No significant change.     Ct Femur Left W Contrast  Oct 16, 2014  Negative for mass lesion. The femoral component of the patient's left hip arthroplasty has broken through the lateral cortex of the femur with the tip protruding into the lateral compartment musculature accounting for the patient's possible mass.     Dg Pelvis Portable  Oct 16, 2014  The femoral component of the left hip arthroplasty extends lateral to the femoral cortex as on CT. No evidence of acute pelvic fracture.     Chest Portable 1 View  10-16-14  No active disease.     Dg Chest Port 1 View  10/08/2014   No acute abnormality seen.     Dg Hip Port Unilat With Pelvis 1v Left  10/08/2014 Satisfactory postoperative appearance of a left total hip prosthesis without inclusion of the inferior portion of the femoral component.     Medical Consultants:  Ortho   Other Consultants:  PT/OT  IAnti-Infectives:   Vancomycin 5/7 --> Zosyn 5/7 -->  Velvet Bathe, MD  [facility] Pager [phone]  If 7PM-7AM, please contact night-coverage [email or web address] Password [email or web address] 10/10/2014, 5:47 PM   LOS: 6  days   HPI/Subjective: No acute issues reported overnight.  Objective: Filed Vitals:   10/10/14 0054 10/10/14 0332 10/10/14 0648 10/10/14 1515  BP: 102/68 134/83 127/70 119/70  Pulse: 86 87 87 53  Temp: 99 F (37.2 C) 99.3 F (37.4 C) 99.1 F (37.3 C) 99.9 F (37.7 C)  TempSrc: Oral Oral Oral Oral  Resp: 18 18 18 18   Height:      Weight:      SpO2: 97% 100% 97% 86%    Intake/Output Summary (Last 24 hours) at 10/10/14 1747 Last data filed at 10/10/14 1541  Gross per 24 hour  Intake 3292.67 ml  Output   1825 ml  Net 1467.67 ml    Exam:   General:  Pt is alert, follows commands  appropriately, not in acute distress  Cardiovascular: Regular rate and rhythm, no rubs, no gallops  Respiratory: Clear to auscultation bilaterally, no wheezing, diminished breath sounds at bases   Abdomen: Soft, non tender, non distended, bowel sounds present, no guarding  Extremities: pulses DP and PT palpable bilaterally, TTP in the left hip area   Neuro: Grossly nonfocal  Data Reviewed: Basic Metabolic Panel:  Recent Labs Lab 10/05/14 0455 10/07/14 0415 10/08/14 0415 10/09/14 0645 10/10/14 0728  NA 138 136 138 135 137  K 3.8 3.7 3.9 4.2 3.7  CL 112* 113* 114* 110 110  CO2 20* 20* 20* 21* 19*  GLUCOSE 106* 110* 112* 136* 123*  BUN 26* 20 20 16 15   CREATININE 1.37* 1.21 1.18 1.20 1.17  CALCIUM 8.3* 8.1* 8.5* 7.7* 7.8*   CBC:  Recent Labs Lab 10/04/14 1046  10/05/14 0455 10/07/14 0415 10/08/14 0415 10/08/14 1150 10/09/14 0645 10/10/14 0728  WBC 6.4  < > 4.9 5.9 6.4  --  9.9 15.7*  NEUTROABS 4.5  --   --   --   --   --   --   --   HGB 12.9*  < > 10.8* 10.8* 11.0* 11.3* 7.7* 9.6*  HCT 40.4  < > 33.9* 34.3* 35.2* 35.4* 23.8* 28.6*  MCV 87.6  < > 87.8 87.7 87.1  --  87.2 86.1  PLT 282  < > 208 201 230  --  174 149*  < > = values in this interval not displayed. Cardiac Enzymes:  Recent Labs Lab 10/04/14 1046 10/09/14 0645 10/09/14 1210 10/09/14 2150  TROPONINI <0.03 0.03 <0.03 0.03   Scheduled Meds: . aspirin EC  325 mg Oral BID  . cholecalciferol  1,000 Units Oral QPM  . docusate sodium  100 mg Oral BID  . efavirenz-emtricitabine-tenofovir  1 tablet Oral QHS  . feeding supplement (ENSURE ENLIVE)  237 mL Oral BID BM  . ferrous sulfate  325 mg Oral TID PC  . piperacillin-tazobactam (ZOSYN)  IV  3.375 g Intravenous Q8H  . senna-docusate  1 tablet Oral QHS  . tamsulosin  0.4 mg Oral BID  . vancomycin  750 mg Intravenous Q12H  . vitamin A  10,000 Units Oral QPM  . vitamin C  500 mg Oral QPM   Continuous Infusions: . sodium chloride 75 mL/hr at 10/10/14  1721  . sodium chloride 0.9 % 1,000 mL with potassium chloride 10 mEq infusion Stopped (10/10/14 1721)

## 2014-10-10 NOTE — Progress Notes (Signed)
Patient had bloody stool with some clots, patient denies any distress, stated he have had bloody stool before.  Dr Wendee Beavers notified no new order given will continue to assess patient.

## 2014-10-10 NOTE — Progress Notes (Signed)
Report called to [place] by Fenton RN. Pt to be transferred to 1430 for telemetry monitering per orders. earlier today.Pt states had some brief time of chest tightness earlier today,but none since.VSS.Pt transfered at 2220 via his bed in stable condition by Gerald Stabs NT and Audree Camel RN- [facility] Daylene Posey

## 2014-10-10 NOTE — Progress Notes (Signed)
PT Cancellation Note  Patient Details Name: Billy Lopez. MRN: [id number] DOB: 12-09-1940   Cancelled Treatment:    Reason Eval/Treat Not Completed: pt declined assist back to bed at this time. Pt wishes to remain in the recliner until later this afternoon. Explained to pt that nursing will have to assist him back to bed-pt agreeable to this.    Weston Anna, MPT Pager: [phone]

## 2014-10-10 NOTE — Progress Notes (Signed)
Occupational Therapy Evaluation Patient Details Name: Billy Lopez. MRN: [id number] DOB: 1941/03/03 Today's Date: 10/10/2014    History of Present Illness 74 yo male admittee with periprosthetic fx. s/p L THA rev 10/08/14. Hx of HIV, COPD, Hep C, Hep B.    Clinical Impression   PTA, pt was independent with ADL and mobility. Pt currently requires Max A with LB ADL. Evaluation limited by pain. Pt will benefit from rehab at SNF to facilitate safe D/C home. Will follow acutely to address established goals.     Follow Up Recommendations  SNF;Supervision/Assistance - 24 hour    Equipment Recommendations  Other (comment) (defer to SNF)    Recommendations for Other Services       Precautions / Restrictions Precautions Precautions: Fall Restrictions Weight Bearing Restrictions: Yes LLE Weight Bearing: Partial weight bearing      Mobility Bed Mobility               General bed mobility comments: Pt up in chair and declined getting back into bed  Transfers                 General transfer comment: Pt declined and stated he wanted to have 2 people present                                                ADL Overall ADL's : Needs assistance/impaired     Grooming: Set up   Upper Body Bathing: Set up;Sitting   Lower Body Bathing: Maximal assistance;Sit to/from stand   Upper Body Dressing : Set up;Sitting   Lower Body Dressing: Maximal assistance;Sit to/from Health and safety inspector Details (indicate cue type and reason): declined to move further         Functional mobility during ADLs:  (pt declined) General ADL Comments: Pt only remembered 1/3 posteiror hip precuations from earlier PT session. Educated pt on hip precuations. pt able to verbalize PWB status. Began educating pt on need for AE inorder to complete LB ADL. Pt stated his session this am with PT was very painful and that his feet "were not hitting the floor until he was  getting back into the bed at 4pm". Pt asking about D/C to SNF. Deferred to [place].                      Pertinent Vitals/Pain Pain Assessment: 0-10 Pain Score: 9  Pain Location: L hip Pain Descriptors / Indicators: Discomfort;Aching;Crushing Pain Intervention(s): Limited activity within patient's tolerance;Monitored during session;Repositioned;Ice applied     Hand Dominance Right   Extremity/Trunk Assessment Upper Extremity Assessment Upper Extremity Assessment: Overall WFL for tasks assessed   Lower Extremity Assessment Lower Extremity Assessment: Defer to PT evaluation   Cervical / Trunk Assessment Cervical / Trunk Assessment: Normal   Communication Communication Communication: No difficulties   Cognition Arousal/Alertness: Awake/alert Behavior During Therapy: Anxious Overall Cognitive Status: Within Functional Limits for tasks assessed                     General Comments                    Home Living Family/patient expects to be discharged to:: Skilled nursing facility  Prior Functioning/Environment Level of Independence: Independent             OT Diagnosis: Generalized weakness;Acute pain   OT Problem List: Decreased strength;Decreased range of motion;Decreased activity tolerance;Impaired balance (sitting and/or standing);Decreased safety awareness;Decreased knowledge of use of DME or AE;Decreased knowledge of precautions;Pain;Increased edema   OT Treatment/Interventions: Self-care/ADL training;Therapeutic exercise;DME and/or AE instruction;Therapeutic activities;Balance training;Patient/family education    OT Goals(Current goals can be found in the care plan section) Acute Rehab OT Goals Patient Stated Goal: to not be in pain OT Goal Formulation: With patient Time For Goal Achievement: 10/24/14 Potential to Achieve Goals: Good  OT Frequency: Min 2X/week   Barriers to D/C:             Co-evaluation              End of Session Equipment Utilized During Treatment: Left knee immobilizer Nurse Communication: Mobility status  Activity Tolerance: Patient limited by pain Patient left: in chair;with call bell/phone within reach   Time: 2035-5974 OT Time Calculation (min): 14 min Charges:  OT General Charges $OT Visit: 1 Procedure OT Evaluation $Initial OT Evaluation Tier I: 1 Procedure G-Codes:    Irem Stoneham,HILLARY 2014-10-17, 1:54 PM   [facility], OTR/L  [phone] 17-Oct-2014

## 2014-10-10 NOTE — Progress Notes (Signed)
Occupational Therapy Treatment Patient Details Name: Billy Lopez. MRN: [id number] DOB: 1941/04/05 Today's Date: 10/10/2014    History of present illness 74 yo male admittee with periprosthetic fx. s/p L THA rev 10/08/14. Hx of HIV, COPD, Hep C, Hep B.    OT comments  Assisted with toilet transfer and bed mobility. Pt required mod A for mobility this pm. Max cues to maintain PWB WBS and to follow hip precautions. Will continue to follow to address established goals.   Follow Up Recommendations  SNF;Supervision/Assistance - 24 hour    Equipment Recommendations  Other (comment) (TBA at SNF)    Recommendations for Other Services      Precautions / Restrictions Precautions Precautions: Fall Precaution Comments: Max cues to maintain WBS Required Braces or Orthoses: Knee Immobilizer - Left (no order noted in chart) Restrictions Weight Bearing Restrictions: Yes LLE Weight Bearing: Partial weight bearing LLE Partial Weight Bearing Percentage or Pounds: 50%       Mobility Bed Mobility Overal bed mobility: Needs Assistance Bed Mobility: Sit to Supine     Mod A with sit - supine Mod a to problem solve       Transfers Overall transfer level: Needs assistance   Transfers: Sit to/from Stand;Stand Pivot Transfers Sit to Stand: Mod assist Stand pivot transfers: Mod assist       General transfer comment: Max cues to maintain WBS; Max cues for precautions; Max cues for safety  More assistance required from lower surface    Balance Overall balance assessment: Needs assistance   Sitting balance-Leahy Scale: Good     Standing balance support: During functional activity;Bilateral upper extremity supported Standing balance-Leahy Scale: Poor Standing balance comment: heavy reliance on RW                   ADL Overall ADL's : Needs assistance/impaired     Grooming: Set up   Upper Body Bathing: Set up;Sitting   Lower Body Bathing: Maximal assistance;Sit to/from  stand   Upper Body Dressing : Set up;Sitting   Lower Body Dressing: Maximal assistance;Sit to/from stand   Toilet Transfer: Moderate assistance;BSC;Stand-pivot Toilet Transfer Details (indicate cue type and reason): declined to move further Toileting- Water quality scientist and Hygiene: Moderate assistance;Sit to/from stand       Functional mobility during ADLs: Moderate assistance;Rolling walker;Cueing for sequencing;Cueing for safety (not maintaining PWB status) General ADL Comments: Pt able to recall 2/3 precautions                                      Cognition   Behavior During Therapy: Anxious; impulsive at times Overall Cognitive Status: No family/caregiver present to determine baseline cognitive functioning  Difficulty with problem solving; sequencing; ? STM deficits      Memory:  (apparent difficulty with sequencing and problem solving)               Extremity/Trunk Assessment  Upper Extremity Assessment Upper Extremity Assessment: Overall WFL for tasks assessed   Lower Extremity Assessment Lower Extremity Assessment: Defer to PT evaluation   Cervical / Trunk Assessment Cervical / Trunk Assessment: Normal     Pertinent Vitals/ Pain       Pain Assessment: 0-10 Pain Score: 9  Pain Location: L hip Pain Descriptors / Indicators: Aching;Discomfort Pain Intervention(s): Limited activity within patient's tolerance;Monitored during session  Home Living Family/patient expects to be discharged to:: Skilled nursing facility  Prior Functioning/Environment Level of Independence: Independent            Frequency Min 2X/week     Progress Toward Goals  OT Goals(current goals can now be found in the care plan section)  Progress towards OT goals: Progressing toward goals  Acute Rehab OT Goals Patient Stated Goal: to not be in pain OT Goal Formulation: With patient Time For Goal  Achievement: 10/24/14 Potential to Achieve Goals: Good ADL Goals Pt Will Perform Lower Body Bathing: with set-up;with supervision;with adaptive equipment;sit to/from stand Pt Will Perform Lower Body Dressing: with set-up;with supervision;with adaptive equipment;sit to/from stand Pt Will Transfer to Toilet: with supervision;ambulating;bedside commode Additional ADL Goal #1: Pt will independently verbalize 3/3 posterior hip precautions  Plan Discharge plan remains appropriate    Co-evaluation                 End of Session Equipment Utilized During Treatment: Left knee immobilizer;Rolling walker;Gait belt   Activity Tolerance Patient tolerated treatment well   Patient Left in bed;with call bell/phone within reach;with bed alarm set   Nurse Communication Mobility status;Precautions;Weight bearing status        Time: 1537-1601 OT Time Calculation (min): 24 min  Charges: OT General Charges $OT Visit: 1 Procedure OT Treatments $Self Care/Home Management : 23-37 mins  Roi Jafari,HILLARY 10/10/2014, 4:20 PM  [facility], OTR/L  [phone] 10/10/2014

## 2014-10-10 NOTE — Progress Notes (Addendum)
CSW met with pt this am to assist with d/c planning. Pt reports he would like to d/c home following hospitalization. Benefits of ST Rehab vs HHPT reviewed with pt. Pt reports that he has [facility] 7x a wk for 5 hrs. CSW will meet with pt following PT today to continue assisting with d/c planning. Weekend CSW did Scientist, forensic and authorization will be provided for ST Rehab if pt chooses this option.  Werner Lean LCSW [phone]  0940 : Pt contacted CSW to report that he is interested in [facility]. CSW has left a message at SNF to confirm bed availability.  Werner Lean LCSW [phone]

## 2014-10-11 ENCOUNTER — Inpatient Hospital Stay (HOSPITAL_COMMUNITY): Payer: Commercial Managed Care - HMO

## 2014-10-11 LAB — TYPE AND SCREEN
ABO/RH(D): A POS
Antibody Screen: NEGATIVE
UNIT DIVISION: 0
Unit division: 0

## 2014-10-11 LAB — URINALYSIS, ROUTINE W REFLEX MICROSCOPIC
Bilirubin Urine: NEGATIVE
Glucose, UA: NEGATIVE mg/dL
Ketones, ur: NEGATIVE mg/dL
LEUKOCYTES UA: NEGATIVE
Nitrite: NEGATIVE
Protein, ur: 30 mg/dL — AB
Specific Gravity, Urine: 1.02 (ref 1.005–1.030)
UROBILINOGEN UA: 1 mg/dL (ref 0.0–1.0)
pH: 7 (ref 5.0–8.0)

## 2014-10-11 LAB — BASIC METABOLIC PANEL
Anion gap: 4 — ABNORMAL LOW (ref 5–15)
BUN: 16 mg/dL (ref 6–20)
CALCIUM: 7.6 mg/dL — AB (ref 8.9–10.3)
CO2: 17 mmol/L — ABNORMAL LOW (ref 22–32)
Chloride: 112 mmol/L — ABNORMAL HIGH (ref 101–111)
Creatinine, Ser: 1.14 mg/dL (ref 0.61–1.24)
GLUCOSE: 144 mg/dL — AB (ref 70–99)
POTASSIUM: 3.6 mmol/L (ref 3.5–5.1)
SODIUM: 133 mmol/L — AB (ref 135–145)

## 2014-10-11 LAB — VANCOMYCIN, TROUGH: Vancomycin Tr: 13 ug/mL (ref 10.0–20.0)

## 2014-10-11 LAB — URINE MICROSCOPIC-ADD ON

## 2014-10-11 MED ORDER — VANCOMYCIN HCL IN DEXTROSE 1-5 GM/200ML-% IV SOLN
1000.0000 mg | Freq: Two times a day (BID) | INTRAVENOUS | Status: DC
  Administered 2014-10-12 – 2014-10-13 (×3): 1000 mg via INTRAVENOUS
  Filled 2014-10-11 (×4): qty 200

## 2014-10-11 NOTE — Progress Notes (Signed)
ANTIBIOTIC CONSULT NOTE - FOLLOW UP  Pharmacy Consult for Vancomycin, Zosyn Indication: Sepsis  No Known Allergies  Patient Measurements: Height: 5\' 8"  (172.7 cm) Weight: 164 lb (74.39 kg) IBW/kg (Calculated) : 68.4  Vital Signs: Temp: 99.8 F (37.7 C) (05/10 0514) Temp Source: Oral (05/10 0514) BP: 108/72 mmHg (05/10 0514) Pulse Rate: 88 (05/10 0514) Intake/Output from previous day: 05/09 0701 - 05/10 0700 In: 2460 [P.O.:360; I.V.:1800; IV Piggyback:300] Out: 2250 [Urine:2250]  Labs:  Recent Labs  10/08/14 1150 10/09/14 0645 10/10/14 0728  WBC  --  9.9 15.7*  HGB 11.3* 7.7* 9.6*  PLT  --  174 149*  CREATININE  --  1.20 1.17   Estimated Creatinine Clearance: 55.2 mL/min (by C-G formula based on Cr of 1.17). No results for input(s): VANCOTROUGH, VANCOPEAK, VANCORANDOM, GENTTROUGH, GENTPEAK, GENTRANDOM, TOBRATROUGH, TOBRAPEAK, TOBRARND, AMIKACINPEAK, AMIKACINTROU, AMIKACIN in the last 72 hours.    Assessment: 74 y.o M with hx HTN, COPD, prostate CA, hepatitis, HIV, and left hip arthroplasty who presented to the ED on 5/3 s/p fall and was found to have a broken L arthroplasty. He had a left total hip replacement revision on 5/7. Patient was febrile post OR with Tmax 102.8. Pharmacy is consulted to dose vancomycin and zosyn for suspected sepsis.   5/7 >> Vanc >> 5/7 >> Zosyn >>  Today, 10/11/2014:  Day #4 Vancomycin and Zosyn  Tmax: 102 overnight, currently 99.8  WBC: elevated, 15.7  Renal: SCr 1.17, CrCl ~ 55 mlmin  Blood cultures NGTD, Urine culture NGF.  Goal of Therapy:  Vancomycin trough level 15-20 mcg/ml  Plan:   Continue Zosyn 3.375g IV Q8H infused over 4hrs.  Continue Vancomycin 750 mg IV q12h.  Measure Vanc trough at steady state, before next dose tonight.  Follow up renal fxn, culture results, and clinical course.  Gretta Arab PharmD, BCPS Pager [phone] 10/11/2014 9:26 AM

## 2014-10-11 NOTE — Progress Notes (Signed)
Nutrition Follow-up  DOCUMENTATION CODES: Not applicable  INTERVENTION:  Ensure Enlive (each supplement provides 350kcal and 20 grams of protein)  NUTRITION DIAGNOSIS:  Increased nutrient needs related to wound healing (pt s/p hip fx repair) as evidenced by  (wt loss). - ongoing   GOAL:  Patient will meet greater than or equal to 90% of their needs -met  MONITOR:  PO intake, Supplement acceptance, Weight trends  ASSESSMENT: Per chart review, pt has been eating 100% on average. Pt ate 100% of lunch today which consisted of potato soup and juice. Pt reports he had a large breakfast and ate most of it. Pt reports good appetite and denies abdominal pain or nausea. He loves the Ensure and has been drinking most of it when he receives it.  Pt meeting needs. Labs and medications reviewed.  Height:  Ht Readings from Last 1 Encounters:  10/04/14 $RemoveB'5\' 8"'eBCCMCmq$  (1.727 m)    Weight:  Wt Readings from Last 1 Encounters:  10/04/14 164 lb (74.39 kg)    Ideal Body Weight:  70 kg  Wt Readings from Last 10 Encounters:  10/04/14 164 lb (74.39 kg)  09/29/14 164 lb (74.39 kg)  04/19/14 163 lb 4 oz (74.05 kg)  03/30/14 166 lb (75.297 kg)  10/19/13 172 lb (78.019 kg)  10/14/13 177 lb (80.287 kg)  06/17/13 167 lb (75.751 kg)  04/16/13 167 lb 8 oz (75.978 kg)  02/16/13 166 lb (75.297 kg)  01/11/13 160 lb 8 oz (72.802 kg)    BMI:  Body mass index is 24.94 kg/(m^2).  Estimated Nutritional Needs:  Kcal:  1900-2100  Protein:  105-120 g  Fluid:  2.0 L/day  Skin:  L hip surgical wound from hip revision 5/7  Diet Order:  Diet regular Room service appropriate?: Yes; Fluid consistency:: Thin  EDUCATION NEEDS:  Education needs addressed   Intake/Output Summary (Last 24 hours) at 10/11/14 1045 Last data filed at 10/11/14 0900  Gross per 24 hour  Intake 2727.5 ml  Output   2100 ml  Net  627.5 ml    Last BM:  5/9   Jarome Matin, RD, LDN Inpatient Clinical Dietitian Pager #  [phone] After hours/weekend pager # [phone]

## 2014-10-11 NOTE — Progress Notes (Signed)
Physical Therapy Treatment Patient Details Name: Billy Lopez. MRN: [id number] DOB: [birth year] Today's Date: 10/11/2014    History of Present Illness 74 yo male admittee with periprosthetic fx. s/p L THA rev 10/08/14. Hx of HIV, COPD, Hep C, Hep B.     PT Comments    POD # 3 pt progressing well.  Removed KI and instructed to pt on use of KI  for bed mobility/positioning.  Assisted OOB to amb to BR for attempted BM.  Assisted with amb a limited distance in hallway.  Instruction on PWB and L LE positioning. Returned to room then performed THR TE's followed by ICE.    Follow Up Recommendations  SNF Karenann Cai)     Equipment Recommendations       Recommendations for Other Services       Precautions / Restrictions Precautions Precautions: Fall Precaution Comments: pt verbally recalls 3/3 THP but requires 50% VC's during functional activities Required Braces or Orthoses: Knee Immobilizer - Left (on while in bed for hip positioning) Restrictions Weight Bearing Restrictions: Yes LLE Weight Bearing: Partial weight bearing LLE Partial Weight Bearing Percentage or Pounds: 50%    Mobility  Bed Mobility Overal bed mobility: Needs Assistance Bed Mobility: Supine to Sit     Supine to sit: Min assist;Mod assist     General bed mobility comments: assist with L LE and use of pad to complete EOB scooting   Transfers Overall transfer level: Needs assistance Equipment used: Rolling walker (2 wheeled) Transfers: Sit to/from Stand   Stand pivot transfers: Mod assist       General transfer comment: 75% VC's on proper hand placement plus L LE advancement to avoid flexing fowars (hip flex < 90 degrees).  50 % VC's on safety with turn completion prior to sit.   Ambulation/Gait Ambulation/Gait assistance: +2 safety/equipment;Mod assist Ambulation Distance (Feet): 28 Feet Assistive device: Rolling walker (2 wheeled) Gait Pattern/deviations: Step-to pattern;Decreased stance time -  left;Decreased step length - right;Decreased step length - left;Trunk flexed;Narrow base of support Gait velocity: decreased   General Gait Details: 50% VC's on upright posture and proper walker to self distane.  75% VC's on 50% WB and safety with turns in reguard to L hip internal rotation.    Stairs            Wheelchair Mobility    Modified Rankin (Stroke Patients Only)       Balance                                    Cognition Arousal/Alertness: Awake/alert Behavior During Therapy: Anxious Overall Cognitive Status: Within Functional Limits for tasks assessed                      Exercises   Total Hip Replacement TE's 10 reps ankle pumps 10 reps knee presses 10 reps heel slides 10 reps SAQ's  Followed by ICE     General Comments        Pertinent Vitals/Pain Pain Assessment: 0-10 Pain Score: 8  Pain Location: L hip Pain Descriptors / Indicators: Aching;Sore;Tender;Tightness Pain Intervention(s): Monitored during session;Repositioned;Ice applied;Patient requesting pain meds-RN notified    Home Living                      Prior Function            PT Goals (current goals can  now be found in the care plan section) Progress towards PT goals: Progressing toward goals    Frequency       PT Plan      Co-evaluation             End of Session Equipment Utilized During Treatment: Gait belt Activity Tolerance: Patient limited by fatigue;Patient limited by pain Patient left: in chair;with call bell/phone within reach     Time: 0912-0943 PT Time Calculation (min) (ACUTE ONLY): 31 min  Charges:  $Gait Training: 8-22 mins $Therapeutic Exercise: 8-22 mins                    G Codes:      Rica Koyanagi  PTA [facility]  Acute  Rehab Pager      [phone]

## 2014-10-11 NOTE — Progress Notes (Signed)
Patient ID: Billy Brink., male   DOB: [birth year], 74 y.o.   MRN: [id number]  [facility] PROGRESS NOTE  Billy Brink. PHX:[id number] DOB: [birth year] DOA: Oct 10, 2014 PCP: [name], MD   Brief narrative:    73 y/o male with PMH of HTN, Substance Abuse, h/o Prostate CA, COPD, CKD, Hepatitis, HIV, h/o L hip arthroplasty presented with mechanical fall and found to have broken L arthoplasty.   Admitted with L hip pian, left hip arthroplasty has broken. Surgery planned 10/08/2014.  Assessment/Plan:    1. Mechanical fall with L hip pian - left hip arthroplasty has broken. Failed left hip surgery. S/P left total hip replacement revision 5/7, post op day #3. Continue to provide analgesia as needed. Management per ortho team. Continue aspirin 325 mg PO BID for DVT prophylaxis.   2. SEPSIS, 5/7 - with post op fever up to 102.21F, HR up to 126 bpm, RR 24, SBP 89. Unclear etiology, sepsis work up in progress since 5/7 post op. CXR with no sings of an infectious etiology. - Will repeat urinalysis and chest x ray (patient reports feeling congested) - Currently on Vanco and Zosyn   3. Acute Hypoxic respiratory failure, O2 sat 83% on RA -  - Ct angiogram reports no evidence of pulmonary embolism - 5 mm left lower lobe nodule  4. 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 Cr is now WNL, repeat BMP in AM.   5. HTN. BP on low end of normal. Will continue to hold Enalapril and HCTZ until BP stabilizes.   6. HIV. Cont home regimen   7. Anemia of chronic disease of HIV imposed on acute blood loss anemia, post op- Hg drop post op, from 11.3 --> patient required transfusion. Last hgb 9.6  8. Chest tightness - No chest tightness of pain reported today.  Cardiac enzymes negative  DVT prophylaxis - SCD's, aspirin 325 mg PO BID per ortho team recommendations   Code Status: Full.  Family Communication:  plan of  care discussed with the patient Disposition Plan: Barrier to discharge --> sepsis work up  IV access:  Peripheral IV  Procedures and diagnostic studies:    Ct Head Wo Contrast  2014-10-10  No acute intracranial abnormality.  No significant change.     Ct Femur Left W Contrast  10/10/2014  Negative for mass lesion. The femoral component of the patient's left hip arthroplasty has broken through the lateral cortex of the femur with the tip protruding into the lateral compartment musculature accounting for the patient's possible mass.     Dg Pelvis Portable  10/10/14  The femoral component of the left hip arthroplasty extends lateral to the femoral cortex as on CT. No evidence of acute pelvic fracture.     Chest Portable 1 View  10-10-2014  No active disease.     Dg Chest Port 1 View  10/08/2014   No acute abnormality seen.     Dg Hip Port Unilat With Pelvis 1v Left  10/08/2014 Satisfactory postoperative appearance of a left total hip prosthesis without inclusion of the inferior portion of the femoral component.     Medical Consultants:  Ortho   Other Consultants:  PT/OT  IAnti-Infectives:   Vancomycin 5/7 --> Zosyn 5/7 -->  Velvet Bathe, MD  [facility] Pager [phone]  If 7PM-7AM, please contact night-coverage [email or web address] Password [email or web address] 10/11/2014, 4:55 PM   LOS: 7  days   HPI/Subjective: No acute issues reported overnight.  Objective: Filed Vitals:   10/10/14 2052 10/11/14 0010 10/11/14 0514 10/11/14 1403  BP: 95/68  108/72 124/64  Pulse: 100  88 86  Temp: 102 F (38.9 C) 99.9 F (37.7 C) 99.8 F (37.7 C) 99.6 F (37.6 C)  TempSrc: Oral Oral Oral Oral  Resp: 18  18 19   Height:      Weight:      SpO2: 97%  98% 97%    Intake/Output Summary (Last 24 hours) at 10/11/14 1655 Last data filed at 10/11/14 1610  Gross per 24 hour  Intake   2820 ml  Output   2350 ml  Net    470 ml    Exam:   General:  Pt is alert, follows commands appropriately, not in acute  distress  Cardiovascular: Regular rate and rhythm, no rubs, no gallops  Respiratory: Clear to auscultation bilaterally, no wheezing, diminished breath sounds at bases   Abdomen: Soft, non tender, non distended, bowel sounds present, no guarding  Extremities: pulses DP and PT palpable bilaterally, TTP in the left hip area   Neuro: Grossly nonfocal  Data Reviewed: Basic Metabolic Panel:  Recent Labs Lab 10/05/14 0455 10/07/14 0415 10/08/14 0415 10/09/14 0645 10/10/14 0728  NA 138 136 138 135 137  K 3.8 3.7 3.9 4.2 3.7  CL 112* 113* 114* 110 110  CO2 20* 20* 20* 21* 19*  GLUCOSE 106* 110* 112* 136* 123*  BUN 26* 20 20 16 15   CREATININE 1.37* 1.21 1.18 1.20 1.17  CALCIUM 8.3* 8.1* 8.5* 7.7* 7.8*   CBC:  Recent Labs Lab 10/05/14 0455 10/07/14 0415 10/08/14 0415 10/08/14 1150 10/09/14 0645 10/10/14 0728  WBC 4.9 5.9 6.4  --  9.9 15.7*  HGB 10.8* 10.8* 11.0* 11.3* 7.7* 9.6*  HCT 33.9* 34.3* 35.2* 35.4* 23.8* 28.6*  MCV 87.8 87.7 87.1  --  87.2 86.1  PLT 208 201 230  --  174 149*   Cardiac Enzymes:  Recent Labs Lab 10/09/14 0645 10/09/14 1210 10/09/14 2150  TROPONINI 0.03 <0.03 0.03   Scheduled Meds: . aspirin EC  325 mg Oral BID  . cholecalciferol  1,000 Units Oral QPM  . docusate sodium  100 mg Oral BID  . efavirenz-emtricitabine-tenofovir  1 tablet Oral QHS  . feeding supplement (ENSURE ENLIVE)  237 mL Oral BID BM  . ferrous sulfate  325 mg Oral TID PC  . piperacillin-tazobactam (ZOSYN)  IV  3.375 g Intravenous Q8H  . senna-docusate  1 tablet Oral QHS  . tamsulosin  0.4 mg Oral BID  . vancomycin  750 mg Intravenous Q12H  . vitamin A  10,000 Units Oral QPM  . vitamin C  500 mg Oral QPM   Continuous Infusions: . sodium chloride 75 mL/hr at 10/11/14 1611  . sodium chloride 0.9 % 1,000 mL with potassium chloride 10 mEq infusion Stopped (10/10/14 1721)

## 2014-10-11 NOTE — Progress Notes (Signed)
PHARMACY - VANCOMYCIN (brief note)  Day #4 Vancomycin and Zosyn for sepsis  Vancomycin trough level = 13 mcg/ml on regimen of Vanc 750mg  IV q12h  Noted temperature spike overnight, elevated WBC Scr stable @ 1.14 with CrCl ~ 56 ml/min Urine culture = NG (final) Blood culture = NG to date  Assessment:  Vanc trough < goal of 15-20 mcg/ml  Plan:  Increase Vancomycin to 1000mg  IV q12h beginning with AM dose on 5/11  Leone Haven, PharmD

## 2014-10-11 NOTE — Progress Notes (Signed)
Physical Therapy Treatment Patient Details Name: Billy Lopez. MRN: [id number] DOB: 1941/05/03 Today's Date: 10/11/2014    History of Present Illness 74 yo male admittee with periprosthetic fx. s/p L THA rev 10/08/14. Hx of HIV, COPD, Hep C, Hep B.     PT Comments    POD # 3 pm session.  Assisted pt out of recliner to amb in hallway a second time.  Assisted back to bed and applied ICE. Pt cleared to D/C per ortho but staying another night due to fever last night.  Follow Up Recommendations  SNF [facility])     Equipment Recommendations       Recommendations for Other Services       Precautions / Restrictions Precautions Precautions: Fall Precaution Comments: pt verbally recalls 3/3 THP but requires 50% VC's during functional activities Required Braces or Orthoses: Knee Immobilizer - Left (on while in bed for hip positioning) Restrictions Weight Bearing Restrictions: Yes LLE Weight Bearing: Partial weight bearing LLE Partial Weight Bearing Percentage or Pounds: 50%    Mobility  Bed Mobility Overal bed mobility: Needs Assistance Bed Mobility: Sit to Supine     Supine to sit: Max assist     General bed mobility comments: assisted back to bed Max assit to support L LE  Transfers Overall transfer level: Needs assistance Equipment used: Rolling walker (2 wheeled) Transfers: Sit to/from Stand   Stand pivot transfers: Mod assist       General transfer comment: 75% VC's on proper hand placement plus L LE advancement to avoid flexing fowars (hip flex < 90 degrees).  50 % VC's on safety with turn completion prior to sit.   Ambulation/Gait Ambulation/Gait assistance: +2 safety/equipment;Mod assist Ambulation Distance (Feet): 28 Feet Assistive device: Rolling walker (2 wheeled) Gait Pattern/deviations: Step-to pattern;Decreased stance time - left;Decreased step length - right;Decreased step length - left;Trunk flexed;Narrow base of support Gait velocity: decreased   General Gait Details: 50% VC's on upright posture and proper walker to self distane.  75% VC's on 50% WB and safety with turns in reguard to L hip internal rotation.    Stairs            Wheelchair Mobility    Modified Rankin (Stroke Patients Only)       Balance                                    Cognition Arousal/Alertness: Awake/alert Behavior During Therapy: Anxious Overall Cognitive Status: Within Functional Limits for tasks assessed                      Exercises      General Comments        Pertinent Vitals/Pain Pain Assessment: 0-10 Pain Score: 8  Pain Location: L hip Pain Descriptors / Indicators: Aching;Sore;Tender;Tightness Pain Intervention(s): Monitored during session;Repositioned;Ice applied;Patient requesting pain meds-RN notified    Home Living                      Prior Function            PT Goals (current goals can now be found in the care plan section) Progress towards PT goals: Progressing toward goals    Frequency       PT Plan      Co-evaluation             End of Session  Equipment Utilized During Treatment: Gait belt Activity Tolerance: Patient limited by fatigue;Patient limited by pain Patient left: in chair;with call bell/phone within reach     Time: 1305-1320 PT Time Calculation (min) (ACUTE ONLY): 15 min  Charges:  $Gait Training: 8-22 mins $Therapeutic Exercise: 8-22 mins                    G Codes:      Rica Koyanagi  PTA [facility]  Acute  Rehab Pager      [phone]

## 2014-10-11 NOTE — Progress Notes (Signed)
CSW met with pt this am to offer support and assist with d/c planning. Pt has a ST Rehab bed at [facility] today if stable for d/c. [employer] has provided authorization # [id number]. CSW will assist with d/c planning to SNF.  Werner Lean LCSW [phone]

## 2014-10-11 NOTE — Progress Notes (Signed)
Patient ID: Billy Brink., male   DOB: [birth year], 74 y.o.   MRN: [id number] Subjective: 3 Days Post-Op Procedure(s) (LRB): POSTERIOR LEFT TOTAL HIP REVISION (Left)    Patient reports pain as mild.  Reports just a little activity yesterday.  No events however  Objective:   VITALS:   Filed Vitals:   10/11/14 0514  BP: 108/72  Pulse: 88  Temp: 99.8 F (37.7 C)  Resp: 18    Neurovascular intact Incision: dressing C/D/I  LABS  Recent Labs  10/08/14 1150 10/09/14 0645 10/10/14 0728  HGB 11.3* 7.7* 9.6*  HCT 35.4* 23.8* 28.6*  WBC  --  9.9 15.7*  PLT  --  174 149*     Recent Labs  10/09/14 0645 10/10/14 0728  NA 135 137  K 4.2 3.7  BUN 16 15  CREATININE 1.20 1.17  GLUCOSE 136* 123*     Recent Labs  10/08/14 2000  INR 1.22     Assessment/Plan: 3 Days Post-Op Procedure(s) (LRB): POSTERIOR LEFT TOTAL HIP REVISION (Left)   Up with therapy Discharge to SNF today versus tomorrow, stable Orthopaedically  Partial weight bearing left lower extremity Posterior hip precautions  RTC in 2 weeks

## 2014-10-12 DIAGNOSIS — R911 Solitary pulmonary nodule: Secondary | ICD-10-CM

## 2014-10-12 NOTE — Progress Notes (Signed)
Occupational Therapy Treatment Patient Details Name: Billy Lopez. MRN: [id number] DOB: Oct 05, 1940 Today's Date: 10/12/2014    History of present illness 74 yo male admitted with periprosthetic fx. s/p L THA rev 10/08/14. Hx of HIV, COPD, Hep C, Hep B.    OT comments  Educated on AE for ADLs and reviewed precautions--pt verbalizes all.  Plan is for SNF tomorrow  Follow Up Recommendations  Supervision/Assistance - 24 hour    Equipment Recommendations  3 in 1 bedside comode    Recommendations for Other Services      Precautions / Restrictions Precautions Precautions: Fall Precaution Comments: pt recalled 3/3 thps Restrictions LLE Weight Bearing: Partial weight bearing LLE Partial Weight Bearing Percentage or Pounds: 50%       Mobility Bed Mobility               General bed mobility comments: pt up in chair  Transfers                 General transfer comment: pt did not wish to stand    Balance                                   ADL                                         General ADL Comments: Pt had been up in chair for only 1/2 hour.  Did not wish to stand nor use commode.  Educated pt on AE and pt used from chair level only.  He has had THA in the past.  Able to use with cues, and verbalized all precautions      Vision                     Perception     Praxis      Cognition   Behavior During Therapy: [facility] for tasks assessed/performed Overall Cognitive Status: Within Functional Limits for tasks assessed                       Extremity/Trunk Assessment               Exercises     Shoulder Instructions       General Comments      Pertinent Vitals/ Pain       Pain Score: 2  (sitting) Pain Location: L hip Pain Descriptors / Indicators: Sore Pain Intervention(s): Limited activity within patient's tolerance;Monitored during session;Ice applied  Home Living                                          Prior Functioning/Environment              Frequency Min 2X/week     Progress Toward Goals  OT Goals(current goals can now be found in the care plan section)  Progress towards OT goals: Progressing toward goals  Acute Rehab OT Goals Time For Goal Achievement: 10/24/14  Plan Frequency needs to be updated    Co-evaluation                 End of Session     Activity Tolerance Patient tolerated treatment  well   Patient Left in chair;with call bell/phone within reach   Nurse Communication          Time: 0211-1735 OT Time Calculation (min): 17 min  Charges: OT General Charges $OT Visit: 1 Procedure OT Treatments $Self Care/Home Management : 8-22 mins  [place] 10/12/2014, 3:28 PM   Lesle Chris, OTR/L [phone] 10/12/2014

## 2014-10-12 NOTE — Progress Notes (Signed)
Patient ID: Billy Brink., male   DOB: July 05, 1940, 74 y.o.   MRN: [id number] [facility] PROGRESS NOTE  Billy Brink. ZMO:[id number] DOB: [birth year] DOA: 10/04/2014 PCP: [name], MD  Brief narrative:    74 y/o male with  Past medical history of hypertension, prostate cancer, COPD, hepatitis, HIV on ART, history of left hip arthroplasty who presented to [facility] long hospital status post mechanical fall. Patient was found to have fracture of left hip arthroplasty. Patient underwent posterior left total hip revision on 10/08/2014.   Hospital course complicated with development of postoperative fever that required sepsis workup. Patient was started on vancomycin and Zosyn. Blood cultures and urine cultures so far show no growth.  Barrier to discharge: patient is on IV antibiotics, vancomycin and Zosyn as part of sepsis workup. He still has pain at the surgery site, 5 out of 10 this morning. Blood culture final report we'll be ready by tomorrow. Anticipated discharge 10/13/2014 to skilled nursing facility.   Assessment/Plan:    Principal problem:  Mechanical fall /  Left hip arthroplasty fracture -  Patient sustained a mechanical fall and fracture of left hip arthroplasty. -  Patient is status post left total hip replacement revision done 10/08/2014. -  Continue current pain management efforts. -  Patient is stable from orthopedic surgery standpoint for discharge to skilled nursing facility. -  Patient will be on aspirin 325 mg twice daily for DVT prophylaxis.  Active problems: Sepsis,  Unspecified organism -  Patient developed postoperative fever with T max 102.7 F, HR 126 and RR 24. He was also hypotensive.  -  Source of infection unknown. So for blood culture and urine culture shows no growth. -  Patient was started on vancomycin and Zosyn. Will change to Levaquin tomorrow prior to discharge.  Acute  Respiratory failure with hypoxia /  Acute COPD exacerbation / Lung nodule   -  Hypoxia likely secondary to patient's history of COPD. CT angiogram of the chest did not reveal pulmonary embolism. -  CT chest however did reveal 5 mm left lower lobe nodule which requires a follow-up in 6-12 months if patient at high risk for bronchogenic carcinoma  Otherwise follow up in 12 months is recommended per guidelines.  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 -  Slight elevation in creatinine on the admission likely because of enalapril and HCTZ which were put on hold at the time of the admission.  Essential hypertension -  Continue to hold enalapril and HCTZ for now. Blood pressure is stable. BP 111/68.  HIV on ART - On atripla - Stable   Anemia of chronic disease / Acute postoperative blood loss anemia / IDA - Drop in hemoglobin likely secondary to anemia secondary to history of HIV as well as postoperative blood loss anemia. - Pt has received blood transfusion  During this hospital stay. -  Patient also has iron deficiency anemia and he is on ferrous sulfate supplementation.  Chest tightness -  Likely musculoskeletal. Cardiac enzymes within normal limits. No chest pain at this time.   DVT Prophylaxis  - Aspirin and SCD's   Code Status: Full.  Family Communication:  plan of care discussed with the patient; family not at the bedside  Disposition Plan: to SNF 10/13/2014   IV access:  Peripheral IV  Procedures and diagnostic studies:    Ct Angio Chest Pe W/cm &/or Wo Cm 10/09/2014   No evidence of pulmonary embolism.  Very small BILATERAL pleural effusions and bibasilar atelectasis.  5 mm LEFT lower lobe nodule, recommendation below.  If the patient is at high risk for bronchogenic carcinoma, follow-up chest CT at 6-12 months is recommended. If the patient is at low risk for bronchogenic carcinoma, follow-up chest CT at 12 months is recommended.   Dg Chest Port 1 View 10/11/2014  No acute cardiopulmonary disease.   Electronically Signed    By: Marin Olp M.D.   On: 10/11/2014 17:20   Dg Chest Port 1 View 10/08/2014  : No acute abnormality seen.   Electronically Signed   By: Inez Catalina M.D.   On: 10/08/2014 20:21   Dg Hip Port Unilat With Pelvis 1v Left 10/08/2014   Satisfactory postoperative appearance of a left total hip prosthesis without inclusion of the inferior portion of the femoral component.   Electronically Signed   By: Claudie Revering M.D.   On: 10/08/2014 12:00   Medical Consultants:  Orthopedic surgery   Other Consultants:  PT evaluation   IAnti-Infectives:   Vancomycin 5/7 --> Zosyn 5/7 -->    Veronica Guerrant, MD  [facility] Pager [phone]  Time spent in minutes: 25 minutes  If 7PM-7AM, please contact night-coverage [email or web address] Password [facility] 10/12/2014, 11:44 AM   LOS: 8 days    HPI/Subjective: No acute overnight events. Patient reports feeling weak, pain is 5/10 in the left leg especially after moving.   Objective: Filed Vitals:   10/11/14 0514 10/11/14 1403 10/11/14 2039 10/12/14 0443  BP: 108/72 124/64 114/67 111/68  Pulse: 88 86 81 74  Temp: 99.8 F (37.7 C) 99.6 F (37.6 C) 99.3 F (37.4 C) 99.2 F (37.3 C)  TempSrc: Oral Oral Oral Oral  Resp: 18 19 18 18   Height:      Weight:      SpO2: 98% 97% 98% 96%    Intake/Output Summary (Last 24 hours) at 10/12/14 1144 Last data filed at 10/12/14 0935  Gross per 24 hour  Intake   1805 ml  Output   2676 ml  Net   -871 ml    Exam:   General:  Pt is alert, follows commands appropriately, not in acute distress  Cardiovascular: Regular rate and rhythm, S1/S2 (+)  Respiratory: Clear to auscultation bilaterally, no wheezing, no crackles, no rhonchi  Abdomen: Soft, non tender, non distended, bowel sounds present  Extremities: No edema, pulses DP and PT palpable bilaterally  Neuro: Grossly nonfocal  Data Reviewed: Basic Metabolic Panel:  Recent Labs Lab 10/07/14 0415 10/08/14 0415 10/09/14 0645 10/10/14 0728  10/11/14 1948  NA 136 138 135 137 133*  K 3.7 3.9 4.2 3.7 3.6  CL 113* 114* 110 110 112*  CO2 20* 20* 21* 19* 17*  GLUCOSE 110* 112* 136* 123* 144*  BUN 20 20 16 15 16   CREATININE 1.21 1.18 1.20 1.17 1.14  CALCIUM 8.1* 8.5* 7.7* 7.8* 7.6*   Liver Function Tests: No results for input(s): AST, ALT, ALKPHOS, BILITOT, PROT, ALBUMIN in the last 168 hours. No results for input(s): LIPASE, AMYLASE in the last 168 hours. No results for input(s): AMMONIA in the last 168 hours. CBC:  Recent Labs Lab 10/07/14 0415 10/08/14 0415 10/08/14 1150 10/09/14 0645 10/10/14 0728  WBC 5.9 6.4  --  9.9 15.7*  HGB 10.8* 11.0* 11.3* 7.7* 9.6*  HCT 34.3* 35.2* 35.4* 23.8* 28.6*  MCV 87.7 87.1  --  87.2 86.1  PLT 201 230  --  174 149*   Cardiac Enzymes:  Recent Labs Lab 10/09/14 0645 10/09/14 1210 10/09/14 2150  TROPONINI 0.03 <0.03 0.03   BNP: Invalid input(s): POCBNP CBG: No results for input(s): GLUCAP in the last 168 hours.  Recent Results (from the past 240 hour(s))  Culture, blood (x 2)     Status: None (Preliminary result)   Collection Time: 10/08/14  7:57 PM  Result Value Ref Range Status   Specimen Description BLOOD RIGHT ANTECUBITAL  Final   Special Requests BOTTLES DRAWN AEROBIC AND ANAEROBIC 4ML  Final   Culture   Final           BLOOD CULTURE RECEIVED NO GROWTH TO DATE CULTURE WILL BE HELD FOR 5 DAYS BEFORE ISSUING A FINAL NEGATIVE REPORT Performed at [facility]    Report Status PENDING  Incomplete  Culture, blood (x 2)     Status: None (Preliminary result)   Collection Time: 10/08/14  8:00 PM  Result Value Ref Range Status   Specimen Description BLOOD R HAND  Final   Special Requests BOTTLES DRAWN AEROBIC ONLY 4ML  Final   Culture   Final           BLOOD CULTURE RECEIVED NO GROWTH TO DATE CULTURE WILL BE HELD FOR 5 DAYS BEFORE ISSUING A FINAL NEGATIVE REPORT Performed at [facility]    Report Status PENDING  Incomplete  Culture, Urine     Status:  None   Collection Time: 10/08/14  8:05 PM  Result Value Ref Range Status   Specimen Description URINE, CATHETERIZED  Final   Special Requests NONE  Final   Colony Count NO GROWTH Performed at [facility]   Final   Culture NO GROWTH Performed at [facility]   Final   Report Status 10/09/2014 FINAL  Final     Scheduled Meds: . aspirin EC  325 mg Oral BID  . cholecalciferol  1,000 Units Oral QPM  . docusate sodium  100 mg Oral BID  . efavirenz-emtricitabine-tenofovir  1 tablet Oral QHS  . feeding supplement (ENSURE ENLIVE)  237 mL Oral BID BM  . ferrous sulfate  325 mg Oral TID PC  . piperacillin-tazobactam (ZOSYN)  IV  3.375 g Intravenous Q8H  . senna-docusate  1 tablet Oral QHS  . tamsulosin  0.4 mg Oral BID  . vancomycin  1,000 mg Intravenous Q12H  . vitamin A  10,000 Units Oral QPM  . vitamin C  500 mg Oral QPM   Continuous Infusions: . sodium chloride 75 mL/hr at 10/12/14 0705  . sodium chloride 0.9 % 1,000 mL with potassium chloride 10 mEq infusion Stopped (10/10/14 1721)

## 2014-10-12 NOTE — Anesthesia Postprocedure Evaluation (Signed)
  Anesthesia Post-op Note  Patient: Billy Lopez.  Procedure(s) Performed: Procedure(s): POSTERIOR LEFT TOTAL HIP REVISION (Left) Patient is awake and responsive. Pain and nausea are reasonably well controlled. Vital signs are stable and clinically acceptable. Oxygen saturation is clinically acceptable. There are no apparent anesthetic complications at this time. Patient is ready for discharge.

## 2014-10-12 NOTE — Progress Notes (Signed)
Report received from M. Harrell Lark, RN. No change from initial pm assessment. Will continue to monitor and follow POC.

## 2014-10-12 NOTE — Progress Notes (Signed)
Physical Therapy Treatment Patient Details Name: Billy Lopez. MRN: [id number] DOB: 20-May-1941 Today's Date: 10/12/2014    History of Present Illness 74 yo male admittee with periprosthetic fx. s/p L THA rev 10/08/14. Hx of HIV, COPD, Hep C, Hep B.     PT Comments    POD # 4 unable to see am despite several attempts (bath/visitors). Seen in pm assisted OOB to amb a greater distance in hallway then performed THR TE's AARON in recliner followed by ICE.   Follow Up Recommendations  SNF [facility])     Equipment Recommendations       Recommendations for Other Services       Precautions / Restrictions Precautions Precautions: Fall Precaution Comments: pt recalled 3/3 thps Restrictions Weight Bearing Restrictions: Yes LLE Weight Bearing: Partial weight bearing LLE Partial Weight Bearing Percentage or Pounds: 50%    Mobility  Bed Mobility Overal bed mobility: Needs Assistance Bed Mobility: Supine to Sit     Supine to sit: Min assist     General bed mobility comments: min assist to support L LE and increased time.  Great difficulty scooting due to hip pain  Transfers Overall transfer level: Needs assistance Equipment used: Rolling walker (2 wheeled) Transfers: Sit to/from Stand Sit to Stand: Mod assist;+2 safety/equipment         General transfer comment: 25% VC's to advance L LE prior to sit and proper hand placement to increase self ability to rise  Ambulation/Gait Ambulation/Gait assistance: +2 physical assistance;Min assist;Mod assist Ambulation Distance (Feet): 32 Feet   Gait Pattern/deviations: Step-to pattern;Decreased stance time - left;Trunk flexed Gait velocity: decreased   General Gait Details: 50% VC's on upright posture and proper walker to self distane.  75% VC's on 50% WB and safety with turns in reguard to L hip internal rotation.  25% VC's to increase L knee flexion during swing phase.   Stairs            Wheelchair Mobility     Modified Rankin (Stroke Patients Only)       Balance                                    Cognition Arousal/Alertness: Awake/alert Behavior During Therapy: WFL for tasks assessed/performed Overall Cognitive Status: Within Functional Limits for tasks assessed                      Exercises   Total Hip Replacement TE's 10 reps ankle pumps 10 reps knee presses 10 reps heel slides AAROM 10 reps SAQ's  10 reps ABD AAROM Followed by ICE     General Comments        Pertinent Vitals/Pain Pain Assessment: 0-10 Pain Score: 8  Pain Location: L hip Pain Descriptors / Indicators: Tightness;Tender;Sore Pain Intervention(s): Monitored during session;Repositioned;Ice applied    Home Living                      Prior Function            PT Goals (current goals can now be found in the care plan section) Progress towards PT goals: Progressing toward goals    Frequency  Min 3X/week    PT Plan      Co-evaluation             End of Session Equipment Utilized During Treatment: Gait belt Activity Tolerance: Patient  tolerated treatment well Patient left: in chair;with call bell/phone within reach     Time: 1415-1445 PT Time Calculation (min) (ACUTE ONLY): 30 min  Charges:  $Gait Training: 8-22 mins $Therapeutic Exercise: 8-22 mins                    G Codes:      Rica Koyanagi  PTA [facility]  Acute  Rehab Pager      [phone]

## 2014-10-12 NOTE — Progress Notes (Signed)
     Subjective: 4 Days Post-Op Procedure(s) (LRB): POSTERIOR LEFT TOTAL HIP REVISION (Left)   Seen by Dr. Alvan Lopez. Patient reports pain as mild, pain controlled. No events throughout the night.  Objective:   VITALS:   Filed Vitals:   10/12/14 0443  BP: 111/68  Pulse: 74  Temp: 99.2 F (37.3 C)  Resp: 18    Dorsiflexion/Plantar flexion intact Incision: dressing C/D/I No cellulitis present Compartment soft  LABS  Recent Labs  10/10/14 0728  HGB 9.6*  HCT 28.6*  WBC 15.7*  PLT 149*     Recent Labs  10/10/14 0728 10/11/14 1948  NA 137 133*  K 3.7 3.6  BUN 15 16  CREATININE 1.17 1.14  GLUCOSE 123* 144*     Assessment/Plan: 4 Days Post-Op Procedure(s) (LRB): POSTERIOR LEFT TOTAL HIP REVISION (Left) Up with therapy  Change dressing to Meprilex dressing, which can stay on for 3-4 days, then replace daily with gauze and tape. Discharge when ready medically. Follow up in 2 weeks at [facility] Follow up with OLIN,Billy Lopez D in 2 weeks.  Contact information:  [facility] [address] [place] Billy Lopez   PAC  10/12/2014, 9:16 AM

## 2014-10-13 ENCOUNTER — Telehealth: Payer: Self-pay | Admitting: *Deleted

## 2014-10-13 DIAGNOSIS — E1165 Type 2 diabetes mellitus with hyperglycemia: Secondary | ICD-10-CM | POA: Diagnosis not present

## 2014-10-13 DIAGNOSIS — T84041D Periprosthetic fracture around internal prosthetic left hip joint, subsequent encounter: Secondary | ICD-10-CM | POA: Diagnosis not present

## 2014-10-13 DIAGNOSIS — D518 Other vitamin B12 deficiency anemias: Secondary | ICD-10-CM | POA: Diagnosis not present

## 2014-10-13 DIAGNOSIS — R262 Difficulty in walking, not elsewhere classified: Secondary | ICD-10-CM | POA: Diagnosis not present

## 2014-10-13 DIAGNOSIS — N182 Chronic kidney disease, stage 2 (mild): Secondary | ICD-10-CM

## 2014-10-13 DIAGNOSIS — R509 Fever, unspecified: Secondary | ICD-10-CM | POA: Diagnosis not present

## 2014-10-13 DIAGNOSIS — R918 Other nonspecific abnormal finding of lung field: Secondary | ICD-10-CM | POA: Diagnosis not present

## 2014-10-13 DIAGNOSIS — J9601 Acute respiratory failure with hypoxia: Secondary | ICD-10-CM | POA: Diagnosis not present

## 2014-10-13 DIAGNOSIS — J449 Chronic obstructive pulmonary disease, unspecified: Secondary | ICD-10-CM | POA: Diagnosis not present

## 2014-10-13 DIAGNOSIS — R911 Solitary pulmonary nodule: Secondary | ICD-10-CM | POA: Diagnosis not present

## 2014-10-13 DIAGNOSIS — B181 Chronic viral hepatitis B without delta-agent: Secondary | ICD-10-CM | POA: Diagnosis not present

## 2014-10-13 DIAGNOSIS — T84041A Periprosthetic fracture around internal prosthetic left hip joint, initial encounter: Secondary | ICD-10-CM | POA: Diagnosis not present

## 2014-10-13 DIAGNOSIS — B20 Human immunodeficiency virus [HIV] disease: Secondary | ICD-10-CM | POA: Diagnosis not present

## 2014-10-13 DIAGNOSIS — M21252 Flexion deformity, left hip: Secondary | ICD-10-CM | POA: Diagnosis not present

## 2014-10-13 DIAGNOSIS — B182 Chronic viral hepatitis C: Secondary | ICD-10-CM | POA: Diagnosis not present

## 2014-10-13 DIAGNOSIS — S72001A Fracture of unspecified part of neck of right femur, initial encounter for closed fracture: Secondary | ICD-10-CM | POA: Diagnosis not present

## 2014-10-13 DIAGNOSIS — I10 Essential (primary) hypertension: Secondary | ICD-10-CM | POA: Diagnosis not present

## 2014-10-13 DIAGNOSIS — M25559 Pain in unspecified hip: Secondary | ICD-10-CM | POA: Diagnosis not present

## 2014-10-13 DIAGNOSIS — E038 Other specified hypothyroidism: Secondary | ICD-10-CM | POA: Diagnosis not present

## 2014-10-13 MED ORDER — FERROUS SULFATE 325 (65 FE) MG PO TABS: 325.0000 mg | ORAL_TABLET | Freq: Two times a day (BID) | ORAL | Status: DC

## 2014-10-13 MED ORDER — CIPROFLOXACIN HCL 500 MG PO TABS: 500.0000 mg | ORAL_TABLET | Freq: Two times a day (BID) | ORAL | Status: DC

## 2014-10-13 MED ORDER — ASPIRIN 325 MG PO TBEC: 325.0000 mg | DELAYED_RELEASE_TABLET | Freq: Two times a day (BID) | ORAL | Status: DC

## 2014-10-13 MED ORDER — ONDANSETRON HCL 4 MG PO TABS: 4.0000 mg | ORAL_TABLET | Freq: Four times a day (QID) | ORAL | Status: DC | PRN

## 2014-10-13 MED ORDER — ACETAMINOPHEN 325 MG PO TABS: 650.0000 mg | ORAL_TABLET | Freq: Four times a day (QID) | ORAL | Status: AC | PRN

## 2014-10-13 MED ORDER — TRAMADOL HCL 50 MG PO TABS: 50.0000 mg | ORAL_TABLET | Freq: Four times a day (QID) | ORAL | Status: DC | PRN

## 2014-10-13 MED ORDER — BISACODYL 5 MG PO TBEC: 5.0000 mg | DELAYED_RELEASE_TABLET | Freq: Every day | ORAL | Status: AC | PRN

## 2014-10-13 NOTE — Progress Notes (Signed)
CSW assisting with d/c planning. Pt has a SNF bed at [facility] today if stable for d/c.   Werner Lean LCSW [phone]

## 2014-10-13 NOTE — Discharge Summary (Signed)
Physician Discharge Summary  Billy Lopez. ESP:[id number] DOB: 1941-04-04 DOA: 10/04/2014  PCP: [name], MD  Admit date: 10/04/2014 Discharge date: 10/13/2014  Recommendations for Outpatient Follow-up:  1. Patient will continue taking aspirin twice daily as recommended by orthopedic surgery. He will follow up with orthopedic surgery in 2 weeks after discharge. 2. Continue Cipro empirically for 5 days on discharge. Patient has spiked fever postoperatively, sepsis workup initiated but no clear source of infection. 3. Please note that patient will require follow-up CT scan for evaluation of stability of lung nodule detected during this hospital stay on CT scan. He will need follow-up in 6-12 months depending if he is high risk for bronchogenic carcinoma.   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 Patient was found to have fracture of left hip arthroplasty. Patient underwent posterior left total hip revision on 10/08/2014.  Hospital course complicated with development of postoperative fever that required sepsis workup. Patient was started on vancomycin and Zosyn. Blood cultures and urine cultures so far show no growth.   Assessment/Plan:    Principal problem: Mechanical fall / Left hip arthroplasty fracture - Patient sustained a mechanical fall and fracture of left hip arthroplasty. - Status post left total hip replacement revision done 10/08/2014.. - Patient is stable  from orthopedic surgery standpoint for discharge to skilled nursing facility. - Patient will be on aspirin 325 mg twice daily for DVT prophylaxis. - Patient will follow-up with orthopedic surgery in 2 weeks from discharge.  Active problems: Sepsis, Unspecified organism - Patient developed postoperative fever with T max 102.7 F, HR 126 and RR 24. He was also hypotensive.  sepsis workup initiated. Source of infection unknown.  - Urine culture and blood cultures so far show no growth.  -  Patient initially on vancomycin and Zosyn. Will change to ciprofloxacin for 5 days on discharge empirically.   Acute Respiratory failure with hypoxia / Acute COPD exacerbation / Lung nodule  - Hypoxia likely secondary to patient's history of COPD. CT angiogram of the chest did not reveal pulmonary embolism. - CT chest however did reveal 5 mm left lower lobe nodule which requires a follow-up in 6-12 months if patient at high risk for bronchogenic carcinoma Otherwise follow up in 12 months is recommended per guidelines.  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 - Slight elevation in creatinine on the admission likely  due to enalapril  and HCTZ. Those medications were put on hold. Since patient's blood pressure is within normal limits we'll continue to hold these medications.  Essential hypertension - Continue to hold enalapril and HCTZ for now. Blood pressure is stable. BP 111/68, 112/70  HIV on ART - On atripla - Stable   Anemia of chronic disease / Acute postoperative blood loss anemia / IDA - Drop in hemoglobin likely secondary to  combination of iron deficiency anemia as well as anemia secondary to history of HIV as well as postoperative blood loss anemia. - Pt has received blood transfusionduring this  hospitalization.  - Continue ferrous sulfate supplementation   Chest tightness -Likely musculoskeletal.  - Troponin level within normal limits.  - Chest pain  resolved.    DVT Prophylaxis  - Aspirin and SCD's   Code Status: Full.  Family Communication: plan of care discussed with the patient; family not at the bedside  Disposition Plan: to SNF 10/13/2014   IV access:  Peripheral IV  Procedures and diagnostic studies:   Ct Angio Chest Pe W/cm &/or Wo Cm 10/09/2014 No evidence of pulmonary embolism. Very small BILATERAL pleural effusions and bibasilar atelectasis. 5 mm LEFT lower lobe nodule, recommendation below. If the patient is at high risk for bronchogenic carcinoma, follow-up chest CT at 6-12 months is recommended. If the patient is at low risk for bronchogenic carcinoma, follow-up chest CT at 12 months is recommended.   Dg Chest Port 1 View 10/11/2014 No acute cardiopulmonary disease. Electronically Signed By: Marin Olp M.D. On: 10/11/2014 17:20   Dg Chest Port 1 View 10/08/2014 : No acute abnormality seen. Electronically Signed By: Inez Catalina M.D. On: 10/08/2014 20:21   Dg Hip Port Unilat With Pelvis 1v Left 10/08/2014 Satisfactory postoperative appearance of a left total hip prosthesis without inclusion of the inferior portion of the femoral component. Electronically Signed By: Claudie Revering M.D. On: 10/08/2014 12:00   Medical Consultants:  Orthopedic surgery   Other Consultants:  PT evaluation - SNF recommended   IAnti-Infectives:   Vancomycin 5/7 --> 10/13/2014  Zosyn 5/7 --> 10/13/2014     Signed:  Leisa Lenz, MD  [facility] 10/13/2014, 10:07 AM  Pager #: [phone]  Time spent in minutes: more than 30 minutes   Discharge Exam: Filed Vitals:   10/13/14 0508  BP: 112/70  Pulse: 85  Temp: 99.3 F (37.4 C)  Resp: 20   Filed Vitals:   10/12/14 0443 10/12/14 1405 10/12/14 2140 10/13/14 0508  BP: 111/68 101/59 102/62 112/70  Pulse: 74 81 88 85  Temp: 99.2 F (37.3 C) 98.5 F (36.9 C) 100.1 F (37.8 C) 99.3 F (37.4 C)  TempSrc: Oral Oral Oral Oral   Resp: 18 20 20 20   Height:      Weight:      SpO2: 96% 97% 96% 98%    General: Pt is alert, follows commands appropriately, not in acute distress Cardiovascular: Regular rate and rhythm, S1/S2 +, no murmurs Respiratory: Clear to auscultation bilaterally, no wheezing, no crackles, no rhonchi Abdominal: Soft, non tender, non distended, bowel sounds +, no guarding Extremities: no edema, no cyanosis, pulses palpable bilaterally DP and PT Neuro: Grossly nonfocal  Discharge Instructions  Discharge Instructions    Call MD for:  difficulty breathing, headache or visual disturbances    Complete by:  As directed      Call MD for:  persistant nausea and vomiting    Complete by:  As directed      Call MD for:  severe uncontrolled pain    Complete by:  As directed      Diet - low sodium heart healthy    Complete by:  As directed      Increase activity slowly    Complete by:  As directed             Medication List    STOP taking these medications        enalapril 20 MG tablet  Commonly known as:  VASOTEC     hydrochlorothiazide 25 MG tablet  Commonly known as:  HYDRODIURIL  naproxen 500 MG tablet  Commonly known as:  NAPROSYN      TAKE these medications        acetaminophen 325 MG tablet  Commonly known as:  TYLENOL  Take 2 tablets (650 mg total) by mouth every 6 (six) hours as needed for mild pain (or Fever >/= 101).     aspirin 325 MG EC tablet  Take 1 tablet (325 mg total) by mouth 2 (two) times daily.     bisacodyl 5 MG EC tablet  Commonly known as:  DULCOLAX  Take 1 tablet (5 mg total) by mouth daily as needed for moderate constipation.     cholecalciferol 1000 UNITS tablet  Commonly known as:  VITAMIN D  Take 1,000 Units by mouth every evening.     ciprofloxacin 500 MG tablet  Commonly known as:  CIPRO  Take 1 tablet (500 mg total) by mouth 2 (two) times daily.     efavirenz-emtricitabine-tenofovir 600-200-300 MG per tablet  Commonly known as:  ATRIPLA   Take 1 tablet by mouth at bedtime.     feeding supplement (ENSURE COMPLETE) Liqd  Take 237 mLs by mouth 2 (two) times daily between meals.     ferrous sulfate 325 (65 FE) MG tablet  Take 1 tablet (325 mg total) by mouth 2 (two) times daily with a meal.     ondansetron 4 MG tablet  Commonly known as:  ZOFRAN  Take 1 tablet (4 mg total) by mouth every 6 (six) hours as needed for nausea.     tamsulosin 0.4 MG Caps capsule  Commonly known as:  FLOMAX  take 1 capsule by mouth twice a day     traMADol 50 MG tablet  Commonly known as:  ULTRAM  Take 1 tablet (50 mg total) by mouth every 6 (six) hours as needed for moderate pain.     vitamin A 10000 UNIT capsule  Take 10,000 Units by mouth every evening.     vitamin C 500 MG tablet  Commonly known as:  ASCORBIC ACID  Take 500 mg by mouth every evening.           Follow-up Information    Follow up with Mauri Pole, MD In 2 weeks.   Specialty:  Orthopedic Surgery   Why:  for wound check and staple removal   Contact information:   [address] [place] [address] [phone]       Follow up with [name], MD. Schedule an appointment as soon as possible for a visit in 2 weeks.   Specialties:  Internal Medicine, Radiology   Why:  Follow up appt after recent hospitalization   Contact information:   [place] [address] [phone]        The results of significant diagnostics from this hospitalization (including imaging, microbiology, ancillary and laboratory) are listed below for reference.    Significant Diagnostic Studies: Ct Head Wo Contrast  10/04/2014   CLINICAL DATA:  Fall, hit head today  EXAM: CT HEAD WITHOUT CONTRAST  TECHNIQUE: Contiguous axial images were obtained from the base of the skull through the vertex without intravenous contrast.  COMPARISON:  06/23/2010  FINDINGS: No skull fracture is noted. Paranasal sinuses and mastoid air cells are unremarkable. No  intracranial hemorrhage, mass effect or midline shift. No acute infarction. No mass lesion is noted on this unenhanced scan. The gray and white-matter differentiation is preserved. Minimal cerebral atrophy.  IMPRESSION: No acute intracranial abnormality.  No significant change.   Electronically Signed   By: Lahoma Crocker M.D.   On: 10/04/2014 11:47   Ct Angio Chest Pe W/cm &/or Wo Cm  10/09/2014   CLINICAL DATA:  One day postop from LEFT total hip revision, EKG changes last night, tired, elevated lactic acid, question pulmonary embolism  EXAM: CT ANGIOGRAPHY CHEST WITH CONTRAST  TECHNIQUE: Multidetector CT imaging of the chest was performed using the standard protocol during bolus administration of intravenous contrast. Multiplanar CT image reconstructions and MIPs were obtained to evaluate the vascular anatomy.  CONTRAST:  110mL OMNIPAQUE IOHEXOL 350 MG/ML SOLN  COMPARISON:  None  FINDINGS: Aorta normal caliber without aneurysm or dissection.  Visualized upper abdomen grossly normal within limits of mild respiratory motion.  Pulmonary arteries well opacified and patent.  No evidence of pulmonary embolism.  Very small BILATERAL pleural effusions and bibasilar atelectasis.  Remaining lungs clear.  Scattered mild emphysematous changes.  No pneumothorax or acute osseous findings.  Diffuse osseous demineralization.  5 mm LEFT lower lobe nodule image 50, stability uncertain due to lack of prior studies.  Review of the MIP images confirms the above findings.  IMPRESSION: No evidence of pulmonary embolism.  Very small BILATERAL pleural effusions and bibasilar atelectasis.  5 mm LEFT lower lobe nodule, recommendation below.  If the patient is at high risk for bronchogenic carcinoma, follow-up chest CT at 6-12 months is recommended. If the patient is at low risk for bronchogenic carcinoma, follow-up chest CT at 12 months is recommended. This recommendation follows the consensus statement: Guidelines for Management of Small  Pulmonary Nodules Detected on CT Scans: A Statement from the [place] as published in Radiology 2005;237:395-400.   Electronically Signed   By: Lavonia Dana M.D.   On: 10/09/2014 16:08   Ct Femur Left W Contrast  10/04/2014   CLINICAL DATA:  Left thigh swelling for 1 month. The patient is unable to bear weight. Question mass.  EXAM: CT OF THE LEFT FEMUR WITH CONTRAST  TECHNIQUE: Contiguous axial CT images are taken through the left upper leg after the administration of contrast material.  CONTRAST:  100 mL OMNIPAQUE IOHEXOL 300 MG/ML  SOLN  COMPARISON:  None.  FINDINGS: The patient has a left total hip arthroplasty. The femoral component has broken through the lateral cortex of the femur and is angulated with the tip protruding into the lateral compartment musculature. This accounts for the patient's possible mass. No soft tissue mass is identified. The acetabular component of the patient's left hip arthroplasty is unremarkable. Imaged musculature demonstrates mild atrophy of the scratch there is mild atrophy of the gluteal musculature. Imaged musculature is otherwise unremarkable. Degenerative disease about the left knee is noted.  IMPRESSION: Negative for mass lesion. The femoral component of the patient's left hip arthroplasty has broken through the lateral cortex of the femur with the tip protruding into the lateral compartment musculature accounting for the patient's possible mass.   Electronically Signed   By: Inge Rise M.D.   On: 10/04/2014 14:34   Dg Pelvis Portable  10/04/2014   CLINICAL DATA:  Fall today with loss of consciousness. Increasing left lower extremity swelling and pain for 2 months appear  EXAM: PORTABLE PELVIS 1-2 VIEWS  COMPARISON:  CT same date.  FINDINGS: The bones are mildly demineralized. Patient is status post bilateral total hip arthroplasty. As seen on CT, the left femoral prosthesis has broken through the lateral cortex of the femur with surrounding lucency. This  is incompletely visualized.  There is no evidence of acute pelvic fracture or dislocation. There is no loosening of the acetabular cups. Contrast material is present within the urinary bladder from preceding CT. Moderate lower lumbar spondylosis noted.  IMPRESSION: The femoral component of the left hip arthroplasty extends lateral to the femoral cortex as on CT. No evidence of acute pelvic fracture.   Electronically Signed   By: Richardean Sale M.D.   On: 10/04/2014 18:32   Dg Chest Port 1 View  10/11/2014   CLINICAL DATA:  Fever and weakness today.  COPD.  Prostate cancer.  EXAM: PORTABLE CHEST - 1 VIEW  COMPARISON:  10/08/2014 and 10/04/2014  FINDINGS: Lungs are adequately inflated with minimal scarring over the right midlung. There is no focal consolidation or effusion. Cardiomediastinal silhouette is within normal. Remainder the exam is unchanged.  IMPRESSION: No acute cardiopulmonary disease.   Electronically Signed   By: Marin Olp M.D.   On: 10/11/2014 17:20   Dg Chest Port 1 View  10/08/2014   CLINICAL DATA:  Sepsis  EXAM: PORTABLE CHEST - 1 VIEW  COMPARISON:  10/04/2014  FINDINGS: Cardiac shadow is within normal limits. The lungs are well aerated bilaterally. No focal infiltrate or sizable effusion is seen. No acute bony abnormality is noted. Chronic changes in the shoulder joints are noted bilaterally.  IMPRESSION: No acute abnormality seen.   Electronically Signed   By: Inez Catalina M.D.   On: 10/08/2014 20:21   Chest Portable 1 View  10/04/2014   CLINICAL DATA:  Preop evaluation prior to revision left hip arthroplasty  EXAM: PORTABLE CHEST - 1 VIEW  COMPARISON:  03/22/2011  FINDINGS: There are bilateral chronic bronchitic changes. There is no focal parenchymal opacity, pleural effusion, or pneumothorax. The heart and mediastinal contours are unremarkable.  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  IMPRESSION: No active disease.   Electronically Signed   By: Kathreen Devoid    On: 10/04/2014 18:35   Dg Hip Port Unilat With Pelvis 1v Left  10/08/2014   CLINICAL DATA:  Status post left prosthesis revision with a posterior approach.  EXAM: LEFT HIP (WITH PELVIS) 1 VIEW PORTABLE  COMPARISON:  Earlier today.  FINDINGS: Left total hip prosthesis in satisfactory position and alignment. The inferior portion of the femoral component is not included. A right bipolar prosthesis is also in satisfactory position and alignment. No fracture or dislocation seen. Lower lumbar spine degenerative changes.  IMPRESSION: Satisfactory postoperative appearance of a left total hip prosthesis without inclusion of the inferior portion of the femoral component.   Electronically Signed   By: Claudie Revering M.D.   On: 10/08/2014 12:00   Dg Hip Port Unilat With Pelvis 1v Left  10/08/2014   CLINICAL DATA:  Intraoperative imaging from a left hip arthroplasty revision.  EXAM: LEFT HIP (WITH PELVIS) 1 VIEW PORTABLE  COMPARISON:  Exams earlier this same date.  FINDINGS: Femoral stem component appears well-seated. The acetabular components well seated. There is no acute fracture or evidence of an operative complication.  IMPRESSION: Limited operative imaging during left hip arthroplasty revision.   Electronically Signed   By: Lajean Manes M.D.   On: 10/08/2014 11:20   Dg Hip Port Unilat With Pelvis 1v Left  10/08/2014   CLINICAL DATA:  74 year old male with a history of hip prosthesis revision.  EXAM: LEFT HIP (WITH PELVIS) 1 VIEW PORTABLE  COMPARISON:  10/08/2014, 10/04/2014  FINDINGS: Single cross-table ladder in the operating room for left hip prosthesis revision.  IMPRESSION: Single intraoperative cross-table image during hip prosthesis revision.  Please refer to the dictated operative report for full details of intraoperative findings and procedure.  Signed,  Dulcy Fanny. Earleen Newport, DO  [facility]  [facility]   Electronically Signed   By: Corrie Mckusick D.O.   On: 10/08/2014  09:41   Dg Hip Port Unilat With Pelvis 1v Left  10/08/2014   CLINICAL DATA:  Operative imaging during of left hip arthroplasty.  EXAM: LEFT HIP (WITH PELVIS) 1 VIEW PORTABLE  COMPARISON:  None.  FINDINGS: Single image shows a drill inserted in the proximal femur. The femoral head and neck have been resected. An acetabular prosthetic component appears well seated.  There is no acute fracture or evidence of an operative complication.  IMPRESSION: Operative imaging during left hip prosthesis revision.   Electronically Signed   By: Lajean Manes M.D.   On: 10/08/2014 09:30   Dg Hip Port Unilat With Pelvis 1v Left  10/08/2014   CLINICAL DATA:  Operative imaging during of left hip arthroplasty.  EXAM: LEFT HIP (WITH PELVIS) 1 VIEW PORTABLE  COMPARISON:  None.  FINDINGS: Single image shows a drill inserted in the proximal femur. The femoral head and neck have been resected. An acetabular prosthetic component appears well seated.  There is no acute fracture or evidence of an operative complication.  IMPRESSION: Operative imaging during left hip prosthesis revision.   Electronically Signed   By: Lajean Manes M.D.   On: 10/08/2014 09:30   Dg Hip Port Unilat With Pelvis 1v Left  10/08/2014   CLINICAL DATA:  Left hip revision.  EXAM: LEFT HIP (WITH PELVIS) 1 VIEW PORTABLE  COMPARISON:  10/04/2014.  FINDINGS: The femoral head component of the left hip prosthesis has been removed. A drill and drill bit are demonstrated along the tract of the previously demonstrated femoral component.  IMPRESSION: Intraoperative image, as described above.   Electronically Signed   By: Claudie Revering M.D.   On: 10/08/2014 09:24   Dg Hip Unilat With Pelvis 2-3 Views Left  10/04/2014   CLINICAL DATA:  Hip fracture  EXAM: LEFT HIP (WITH PELVIS) 2-3 VIEWS  COMPARISON:  None.  FINDINGS: Left total hip arthroplasty with lateral displacement of the femoral stem component from the femur with osteolysis of the lateral cortex of the left proximal  femoral diaphysis. There is periprosthetic lucency around the trochanteric component of the arthroplasty. The overall appearance is consistent with hardware failure.  There is no other fracture or dislocation.  IMPRESSION: Left total hip arthroplasty with lateral displacement of the femoral stem component from the femur with osteolysis of the lateral cortex of the left proximal femoral diaphysis. There is periprosthetic lucency around the trochanteric component of the arthroplasty consistent with loosening.   Electronically Signed   By: Kathreen Devoid   On: 10/04/2014 18:35    Microbiology: Recent Results (from the past 240 hour(s))  Culture, blood (x 2)     Status: None (Preliminary result)   Collection Time: 10/08/14  7:57 PM  Result Value Ref Range Status   Specimen Description BLOOD RIGHT ANTECUBITAL  Final   Special Requests BOTTLES DRAWN AEROBIC AND ANAEROBIC 4ML  Final   Culture   Final           BLOOD CULTURE RECEIVED NO GROWTH TO DATE CULTURE WILL BE HELD FOR 5 DAYS BEFORE ISSUING A FINAL NEGATIVE REPORT Performed at [facility]    Report Status PENDING  Incomplete  Culture, blood (x 2)     Status: None (Preliminary result)   Collection Time: 10/08/14  8:00 PM  Result Value Ref Range Status   Specimen Description BLOOD R HAND  Final   Special Requests BOTTLES DRAWN AEROBIC ONLY 4ML  Final   Culture   Final           BLOOD CULTURE RECEIVED NO GROWTH TO DATE CULTURE WILL BE HELD FOR 5 DAYS BEFORE ISSUING A FINAL NEGATIVE REPORT Performed at [facility]    Report Status PENDING  Incomplete  Culture, Urine     Status: None   Collection Time: 10/08/14  8:05 PM  Result Value Ref Range Status   Specimen Description URINE, CATHETERIZED  Final   Special Requests NONE  Final   Colony Count NO GROWTH Performed at [facility]   Final   Culture NO GROWTH Performed at [facility]   Final   Report Status 10/09/2014 FINAL  Final     Labs: Basic  Metabolic Panel:  Recent Labs Lab 10/07/14 0415 10/08/14 0415 10/09/14 0645 10/10/14 0728 10/11/14 1948  NA 136 138 135 137 133*  K 3.7 3.9 4.2 3.7 3.6  CL 113* 114* 110 110 112*  CO2 20* 20* 21* 19* 17*  GLUCOSE 110* 112* 136* 123* 144*  BUN 20 20 16 15 16   CREATININE 1.21 1.18 1.20 1.17 1.14  CALCIUM 8.1* 8.5* 7.7* 7.8* 7.6*   Liver Function Tests: No results for input(s): AST, ALT, ALKPHOS, BILITOT, PROT, ALBUMIN in the last 168 hours. No results for input(s): LIPASE, AMYLASE in the last 168 hours. No results for input(s): AMMONIA in the last 168 hours. CBC:  Recent Labs Lab 10/07/14 0415 10/08/14 0415 10/08/14 1150 10/09/14 0645 10/10/14 0728  WBC 5.9 6.4  --  9.9 15.7*  HGB 10.8* 11.0* 11.3* 7.7* 9.6*  HCT 34.3* 35.2* 35.4* 23.8* 28.6*  MCV 87.7 87.1  --  87.2 86.1  PLT 201 230  --  174 149*   Cardiac Enzymes:  Recent Labs Lab 10/09/14 0645 10/09/14 1210 10/09/14 2150  TROPONINI 0.03 <0.03 0.03   BNP: BNP (last 3 results) No results for input(s): BNP in the last 8760 hours.  ProBNP (last 3 results) No results for input(s): PROBNP in the last 8760 hours.  CBG: No results for input(s): GLUCAP in the last 168 hours.

## 2014-10-13 NOTE — Discharge Summary (Signed)
Report called to Patty at [facility]. All questions answered appropriately.   Pt transported with [facility] to facility. Pt in NAD and stable at time of discharge.   All paperwork sent with [facility] transporters who have no questions.

## 2014-10-13 NOTE — Clinical Social Work Placement (Signed)
   CLINICAL SOCIAL WORK PLACEMENT  NOTE  Date:  10/13/2014  Patient Details  Name: Billy Lopez. MRN: [id number] Date of Birth: [birth year]  Clinical Social Work is seeking post-discharge placement for this patient at the [place] level of care (*CSW will initial, date and re-position this form in  chart as items are completed):  Yes   Patient/family provided with [facility] Work Department's list of facilities offering this level of care within the geographic area requested by the patient (or if unable, by the patient's family).  Yes   Patient/family informed of their freedom to choose among providers that offer the needed level of care, that participate in Medicare, Medicaid or managed care program needed by the patient, have an available bed and are willing to accept the patient.  Yes   Patient/family informed of [facility]'s ownership interest in [facility] and [facility], as well as of the fact that they are under no obligation to receive care at these facilities.  PASRR submitted to EDS on 10/05/14     PASRR number received on 10/05/14     Existing PASRR number confirmed on       FL2 transmitted to all facilities in geographic area requested by pt/family on 10/05/14     FL2 transmitted to all facilities within larger geographic area on       Patient informed that his/her managed care company has contracts with or will negotiate with certain facilities, including the following:        Yes   Patient/family informed of bed offers received.  Patient chooses bed at   [facility]    Physician recommends and patient chooses bed at      Patient to be transferred to   [facility]  .10/13/14  Patient to be transferred to facility by  [facility]     Patient family notified on  10/13/14 of transfer.  Name of family member notified:    Pt notified friend directly .    PHYSICIAN       Additional Comment: Pt is in agreement with d/c to  [facility] today. PT recommended [facility] transport due to hip precautions. [employer] provided prior authorization. NSG reviewed d/c summary, scripts, avs. Scripts included in d/c packet.   Werner Lean LCSW [phone]  _______________________________________________ Luretha Rued, LCSW 10/13/2014, 1:19 PM

## 2014-10-13 NOTE — Discharge Instructions (Signed)
[facility] Stay Proper nutrition can help your body recover from illness and injury.   Foods and beverages high in protein, vitamins, and minerals help rebuild muscle loss, promote healing, & reduce fall risk.   In addition to eating healthy foods, a nutrition shake is an easy, delicious way to get the nutrition you need during and after your hospital stay  It is recommended that you continue to drink 2 bottles per day of:  Ensure Enlive, each supplement provides 350 kcal and 20 grams of protein  for at least 1 month (30 days) after your hospital stay   Tips for adding a nutrition shake into your routine: As allowed, drink one with vitamins or medications instead of water or juice Enjoy one as a tasty mid-morning or afternoon snack Drink cold or make a milkshake out of it Drink one instead of milk with cereal or snacks Use as a coffee creamer   Available at the following grocery stores and pharmacies:           * [place] [phone]            For COUPONS visit: www.ensure.com/join or http://dawson-may.com/   Suggested Substitutions Ensure Plus = Boost Plus = Carnation Breakfast Essentials = Boost Compact Ensure Active Clear = Boost Breeze Glucerna Shake = Boost Glucose Control = Carnation Breakfast Essentials SUGAR FREE     AS FOR LEFT HIP:  Partial weight bearing LEFT lower extremity Posterior hip precautions Keep surgical clean and dry until follow up

## 2014-10-13 NOTE — Progress Notes (Signed)
Patient ID: Billy Brink., male   DOB: 08-04-1940, 74 y.o.   MRN: [id number] Subjective: 5 Days Post-Op Procedure(s) (LRB): POSTERIOR LEFT TOTAL HIP REVISION (Left)    Patient reports pain as mild with regards to his left hip.  Limited activity  Objective:   VITALS:   Filed Vitals:   10/13/14 0508  BP: 112/70  Pulse: 85  Temp: 99.3 F (37.4 C)  Resp: 20    Neurovascular intact Incision: dressing C/D/I  LABS No results for input(s): HGB, HCT, WBC, PLT in the last 72 hours.   Recent Labs  10/11/14 1948  NA 133*  K 3.6  BUN 16  CREATININE 1.14  GLUCOSE 144*    No results for input(s): LABPT, INR in the last 72 hours.   Assessment/Plan: 5 Days Post-Op Procedure(s) (LRB): POSTERIOR LEFT TOTAL HIP REVISION (Left)   Advance diet Discharge to SNF when ready RTC in 2 weeks for wound check

## 2014-10-13 NOTE — Telephone Encounter (Signed)
Pt was on tcm list admitted for total left hip revision- post mechanical fall. Pt will be going to SNF...Billy Lopez

## 2014-10-14 DIAGNOSIS — J449 Chronic obstructive pulmonary disease, unspecified: Secondary | ICD-10-CM | POA: Diagnosis not present

## 2014-10-14 DIAGNOSIS — R509 Fever, unspecified: Secondary | ICD-10-CM | POA: Diagnosis not present

## 2014-10-14 DIAGNOSIS — I10 Essential (primary) hypertension: Secondary | ICD-10-CM | POA: Diagnosis not present

## 2014-10-14 DIAGNOSIS — S72001A Fracture of unspecified part of neck of right femur, initial encounter for closed fracture: Secondary | ICD-10-CM | POA: Diagnosis not present

## 2014-10-14 NOTE — Progress Notes (Signed)
CSW received a call from [facility] [phone]( Admissions Dir ) at [facility] regarding [facility] d/c to them 10/13/14. Bed bugs were found on pt. Director of 6E and charge nurse on 4W alerted.  Werner Lean LCSW [phone]

## 2014-10-15 LAB — CULTURE, BLOOD (ROUTINE X 2)
CULTURE: NO GROWTH
Culture: NO GROWTH

## 2014-10-17 DIAGNOSIS — M25559 Pain in unspecified hip: Secondary | ICD-10-CM | POA: Diagnosis not present

## 2014-10-19 ENCOUNTER — Ambulatory Visit: Payer: Commercial Managed Care - HMO | Admitting: Internal Medicine

## 2014-10-19 DIAGNOSIS — M25559 Pain in unspecified hip: Secondary | ICD-10-CM | POA: Diagnosis not present

## 2014-10-24 DIAGNOSIS — M25559 Pain in unspecified hip: Secondary | ICD-10-CM | POA: Diagnosis not present

## 2014-10-24 DIAGNOSIS — R262 Difficulty in walking, not elsewhere classified: Secondary | ICD-10-CM | POA: Diagnosis not present

## 2014-10-26 ENCOUNTER — Ambulatory Visit: Payer: Commercial Managed Care - HMO | Admitting: Internal Medicine

## 2014-11-03 DIAGNOSIS — T84031D Mechanical loosening of internal left hip prosthetic joint, subsequent encounter: Secondary | ICD-10-CM | POA: Diagnosis not present

## 2014-11-03 DIAGNOSIS — Z471 Aftercare following joint replacement surgery: Secondary | ICD-10-CM | POA: Diagnosis not present

## 2014-11-03 DIAGNOSIS — R2689 Other abnormalities of gait and mobility: Secondary | ICD-10-CM | POA: Diagnosis not present

## 2014-11-03 DIAGNOSIS — Z9181 History of falling: Secondary | ICD-10-CM | POA: Diagnosis not present

## 2014-11-03 DIAGNOSIS — J449 Chronic obstructive pulmonary disease, unspecified: Secondary | ICD-10-CM | POA: Diagnosis not present

## 2014-11-03 DIAGNOSIS — B182 Chronic viral hepatitis C: Secondary | ICD-10-CM | POA: Diagnosis not present

## 2014-11-03 DIAGNOSIS — I10 Essential (primary) hypertension: Secondary | ICD-10-CM | POA: Diagnosis not present

## 2014-11-03 DIAGNOSIS — N182 Chronic kidney disease, stage 2 (mild): Secondary | ICD-10-CM | POA: Diagnosis not present

## 2014-11-04 DIAGNOSIS — B182 Chronic viral hepatitis C: Secondary | ICD-10-CM | POA: Diagnosis not present

## 2014-11-04 DIAGNOSIS — R2689 Other abnormalities of gait and mobility: Secondary | ICD-10-CM | POA: Diagnosis not present

## 2014-11-04 DIAGNOSIS — J449 Chronic obstructive pulmonary disease, unspecified: Secondary | ICD-10-CM | POA: Diagnosis not present

## 2014-11-04 DIAGNOSIS — N182 Chronic kidney disease, stage 2 (mild): Secondary | ICD-10-CM | POA: Diagnosis not present

## 2014-11-04 DIAGNOSIS — I10 Essential (primary) hypertension: Secondary | ICD-10-CM | POA: Diagnosis not present

## 2014-11-04 DIAGNOSIS — Z9181 History of falling: Secondary | ICD-10-CM | POA: Diagnosis not present

## 2014-11-04 DIAGNOSIS — T84031D Mechanical loosening of internal left hip prosthetic joint, subsequent encounter: Secondary | ICD-10-CM | POA: Diagnosis not present

## 2014-11-04 DIAGNOSIS — Z471 Aftercare following joint replacement surgery: Secondary | ICD-10-CM | POA: Diagnosis not present

## 2014-11-05 ENCOUNTER — Other Ambulatory Visit: Payer: Self-pay | Admitting: Internal Medicine

## 2014-11-05 DIAGNOSIS — Z9181 History of falling: Secondary | ICD-10-CM | POA: Diagnosis not present

## 2014-11-05 DIAGNOSIS — J449 Chronic obstructive pulmonary disease, unspecified: Secondary | ICD-10-CM | POA: Diagnosis not present

## 2014-11-05 DIAGNOSIS — N182 Chronic kidney disease, stage 2 (mild): Secondary | ICD-10-CM | POA: Diagnosis not present

## 2014-11-05 DIAGNOSIS — R2689 Other abnormalities of gait and mobility: Secondary | ICD-10-CM | POA: Diagnosis not present

## 2014-11-05 DIAGNOSIS — B182 Chronic viral hepatitis C: Secondary | ICD-10-CM | POA: Diagnosis not present

## 2014-11-05 DIAGNOSIS — I10 Essential (primary) hypertension: Secondary | ICD-10-CM | POA: Diagnosis not present

## 2014-11-05 DIAGNOSIS — Z471 Aftercare following joint replacement surgery: Secondary | ICD-10-CM | POA: Diagnosis not present

## 2014-11-05 DIAGNOSIS — T84031D Mechanical loosening of internal left hip prosthetic joint, subsequent encounter: Secondary | ICD-10-CM | POA: Diagnosis not present

## 2014-11-07 ENCOUNTER — Telehealth: Payer: Self-pay | Admitting: Internal Medicine

## 2014-11-07 ENCOUNTER — Other Ambulatory Visit: Payer: Self-pay | Admitting: *Deleted

## 2014-11-07 DIAGNOSIS — G894 Chronic pain syndrome: Secondary | ICD-10-CM

## 2014-11-07 NOTE — Telephone Encounter (Signed)
laniya jones Education officer, [employer] for the health dept. Cap program is requesting home medication education, or pain management, or both. He is in a lot of pain and he had taken too much of his tramadol. Please call her at [phone]

## 2014-11-07 NOTE — Telephone Encounter (Signed)
Received refill request for blood pressure medication which was not on the pt's current medication profile.  Per Dr. Henreitta Leber last office note the pt's blood pressure was to be managed by his PCP, Dr. [name].  Sent fax back to the pharmacy to send to Dr. [name] for refill.

## 2014-11-08 DIAGNOSIS — T84031D Mechanical loosening of internal left hip prosthetic joint, subsequent encounter: Secondary | ICD-10-CM | POA: Diagnosis not present

## 2014-11-08 DIAGNOSIS — N182 Chronic kidney disease, stage 2 (mild): Secondary | ICD-10-CM | POA: Diagnosis not present

## 2014-11-08 DIAGNOSIS — B182 Chronic viral hepatitis C: Secondary | ICD-10-CM | POA: Diagnosis not present

## 2014-11-08 DIAGNOSIS — J449 Chronic obstructive pulmonary disease, unspecified: Secondary | ICD-10-CM | POA: Diagnosis not present

## 2014-11-08 DIAGNOSIS — Z9181 History of falling: Secondary | ICD-10-CM | POA: Diagnosis not present

## 2014-11-08 DIAGNOSIS — I10 Essential (primary) hypertension: Secondary | ICD-10-CM | POA: Diagnosis not present

## 2014-11-08 DIAGNOSIS — R2689 Other abnormalities of gait and mobility: Secondary | ICD-10-CM | POA: Diagnosis not present

## 2014-11-08 DIAGNOSIS — Z471 Aftercare following joint replacement surgery: Secondary | ICD-10-CM | POA: Diagnosis not present

## 2014-11-08 NOTE — Telephone Encounter (Signed)
[place] for pain clinic referral- I will do  Pt due for ROV  OK for medication education - ok for verbal to Health Dept

## 2014-11-08 NOTE — Telephone Encounter (Signed)
Rx faxed to pharmacy  

## 2014-11-08 NOTE — Telephone Encounter (Signed)
Done hardcopy to [name]  

## 2014-11-08 NOTE — Telephone Encounter (Signed)
Pt is due for follow up, please advise

## 2014-11-09 ENCOUNTER — Encounter: Payer: Self-pay | Admitting: Internal Medicine

## 2014-11-09 ENCOUNTER — Telehealth: Payer: Self-pay | Admitting: Internal Medicine

## 2014-11-09 ENCOUNTER — Ambulatory Visit (INDEPENDENT_AMBULATORY_CARE_PROVIDER_SITE_OTHER): Payer: Commercial Managed Care - HMO | Admitting: Internal Medicine

## 2014-11-09 VITALS — BP 112/64 | HR 65 | Temp 97.0°F | Wt 167.0 lb

## 2014-11-09 DIAGNOSIS — R609 Edema, unspecified: Secondary | ICD-10-CM | POA: Diagnosis not present

## 2014-11-09 DIAGNOSIS — I10 Essential (primary) hypertension: Secondary | ICD-10-CM | POA: Diagnosis not present

## 2014-11-09 DIAGNOSIS — K5900 Constipation, unspecified: Secondary | ICD-10-CM | POA: Diagnosis not present

## 2014-11-09 DIAGNOSIS — B20 Human immunodeficiency virus [HIV] disease: Secondary | ICD-10-CM | POA: Diagnosis not present

## 2014-11-09 MED ORDER — ENALAPRIL MALEATE 20 MG PO TABS: 20.0000 mg | ORAL_TABLET | Freq: Every day | ORAL | Status: DC

## 2014-11-09 MED ORDER — FUROSEMIDE 20 MG PO TABS: 20.0000 mg | ORAL_TABLET | Freq: Every day | ORAL | Status: DC

## 2014-11-09 NOTE — Assessment & Plan Note (Signed)
stable overall by history and exam, recent data reviewed with pt, and pt to continue medical treatment as before,  to f/u any worsening symptoms or concerns BP Readings from Last 3 Encounters:  11/09/14 112/64  10/13/14 112/70  09/29/14 91/56

## 2014-11-09 NOTE — Assessment & Plan Note (Addendum)
Ok for miralax dialy, dulcolox pn,  to f/u any worsening symptoms or concerns, minimize narcotic for pain especially with hx of substance abuse

## 2014-11-09 NOTE — Progress Notes (Signed)
Subjective:    Patient ID: Billy Brink., male    DOB: 1941-04-04, 74 y.o.   MRN: [id number]  HPI    Here to f/u, s/p left hip surgury may 2016, on tramadol prn but home nurse reports overuse, not asking for refill today, on chronic diuretic but with mild worsening bilat left > right leg edema in the past month, Taking HCT,  No recent echo, but declines today. Does have 2 days significant bloating and constipation, without fever, n/v and Denies urinary symptoms such as dysuria, frequency, urgency, flank pain, hematuria or n/v, fever, chills.  Pt denies new neurological symptoms such as new headache, or facial or extremity weakness or numbness Past Medical History  Diagnosis Date  . Hypertension   . HIV infection   . Substance abuse   . 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 Allergic rhinitis, cause unspecified 07/21/2011  . Cervical spondylosis 07/21/2011  . Lumbar spondylosis 07/21/2011  . Osteoarthritis 07/21/2011  . COPD (chronic obstructive pulmonary disease) 07/21/2011  . 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 Hx of radiation therapy 12/17/11 - 02/13/12    prostate  . Prostate cancer 09/19/11    gleason 7  . H/O blood clots   . Pneumonia   . GERD (gastroesophageal reflux disease)   . Hepatitis     Hep C  . Rectal bleeding   . Wears dentures     top  . Wears glasses    Past Surgical History  Procedure Laterality Date  . Hip surgery      total bilateral replacement  . Hernia repair    . Hand surgery      right thumb  . Foot surgery      bilat. ingrown toenails removed  . Colonoscopy N/A 11/17/2012    Procedure: COLONOSCOPY;  Surgeon: Ladene Artist, MD;  Location: [facility];  Service: Endoscopy;  Laterality: N/A;  possible APC  . 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 Total hip revision Left 10/08/2014    Procedure: POSTERIOR LEFT TOTAL HIP  REVISION;  Surgeon: Paralee Cancel, MD;  Location: [facility];  Service: Orthopedics;  Laterality: Left;    reports that he quit smoking about 54 years ago. His smoking use included Cigarettes. He has a 10 pack-year smoking history. He has never used smokeless tobacco. He reports that he does not drink alcohol or use illicit drugs. family history includes Alcohol abuse in his other; Arthritis in his other; Cancer in his mother; Hypertension in his other. There is no history of Colon cancer or Diabetes. No Known Allergies Current Outpatient Prescriptions on File Prior to Visit  Medication Sig Dispense Refill  . acetaminophen (TYLENOL) 325 MG tablet Take 2 tablets (650 mg total) by mouth every 6 (six) hours as needed for mild pain (or Fever >/= 101). 30 tablet 0  . efavirenz-emtricitabine-tenofovir (ATRIPLA) 600-200-300 MG per tablet Take 1 tablet by mouth at bedtime. 30 tablet 8  . ferrous sulfate 325 (65 FE) MG tablet Take 1 tablet (325 mg total) by mouth 2 (two) times daily with a meal. 60 tablet 0  . ondansetron (ZOFRAN) 4 MG tablet Take 1 tablet (4 mg total) by mouth every 6 (six) hours as needed for nausea. 20 tablet 0  . tamsulosin (FLOMAX) 0.4 MG CAPS take 1 capsule by mouth twice a day 60 capsule 0  .  traMADol (ULTRAM) 50 MG tablet TAKE 1 TABLET BY MOUTH  EVERY 6 HOURS AS NEEDED 120 tablet 5  . aspirin EC 325 MG EC tablet Take 1 tablet (325 mg total) by mouth 2 (two) times daily. (Patient not taking: Reported on 11/09/2014) 60 tablet 0  . bisacodyl (DULCOLAX) 5 MG EC tablet Take 1 tablet (5 mg total) by mouth daily as needed for moderate constipation. (Patient not taking: Reported on 11/09/2014) 30 tablet 0  . cholecalciferol (VITAMIN D) 1000 UNITS tablet Take 1,000 Units by mouth every evening.    . ciprofloxacin (CIPRO) 500 MG tablet Take 1 tablet (500 mg total) by mouth 2 (two) times daily. (Patient not taking: Reported on 11/09/2014) 10 tablet 0  . feeding supplement, ENSURE COMPLETE, (ENSURE  COMPLETE) LIQD Take 237 mLs by mouth 2 (two) times daily between meals. (Patient not taking: Reported on 11/09/2014) 474 mL 11  . vitamin A 10000 UNIT capsule Take 10,000 Units by mouth every evening.    . vitamin C (ASCORBIC ACID) 500 MG tablet Take 500 mg by mouth every evening.     No current facility-administered medications on file prior to visit.     Review of Systems  Constitutional: Negative for unusual diaphoresis or night sweats HENT: Negative for ringing in ear or discharge Eyes: Negative for double vision or worsening visual disturbance.  Respiratory: Negative for choking and stridor.   Gastrointestinal: Negative for vomiting or other signifcant bowel change Genitourinary: Negative for hematuria or change in urine volume.  Musculoskeletal: Negative for other MSK pain or swelling Skin: Negative for color change and worsening wound.  Neurological: Negative for tremors and numbness other than noted  Psychiatric/Behavioral: Negative for decreased concentration or agitation other than above       Objective:   Physical Exam BP 112/64 mmHg  Pulse 65  Temp(Src) 97 F (36.1 C) (Oral)  Wt 167 lb (75.751 kg)  SpO2 97% VS noted,  Constitutional: Pt appears in no significant distress HENT: Head: NCAT.  Right Ear: External ear normal.  Left Ear: External ear normal.  Eyes: . Pupils are equal, round, and reactive to light. Conjunctivae and EOM are normal Neck: Normal range of motion. Neck supple.  Cardiovascular: Normal rate and regular rhythm.   Pulmonary/Chest: Effort normal and breath sounds without rales or wheezing.  Abd:  Soft, NT, + BS but mild distended Neurological: Pt is alert. Not confused , motor grossly intact Skin: Skin is warm. No rash, trace to 1+ edema bilat left > right LE edema Psychiatric: Pt behavior is normal. No agitation.     Assessment & Plan:

## 2014-11-09 NOTE — Telephone Encounter (Signed)
Social worker advised and will informed pt, Verbal given

## 2014-11-09 NOTE — Assessment & Plan Note (Signed)
?   Etiology, recent LE venous doppler left neg for acute, suggested repeat labs , ecg, echo but pt irritable, declines today, may re-consider, ok for change hct to lasix 20 qd , f/u 2 wks

## 2014-11-09 NOTE — Telephone Encounter (Signed)
Patient Name: Billy Lopez DOB: 1940/12/05 Initial Comment Caller states had surgery on Friday, hasn't had a BM in 2 days. stomach is bloated Nurse Assessment Nurse: Ronnald Ramp, RN, Miranda Date/Time (Eastern Time): 11/09/2014 10:01:46 AM Confirm and document reason for call. If symptomatic, describe symptoms. ---Caller states he has not had a BM in 2 days. He had hip surgery on Friday. Has the patient traveled out of the country within the last 30 days? ---Not Applicable Does the patient require triage? ---Yes Related visit to physician within the last 2 weeks? ---No Does the PT have any chronic conditions? (i.e. diabetes, asthma, etc.) ---Yes List chronic conditions. ---HIV Guidelines Guideline Title Affirmed Question Affirmed Notes Constipation Abdomen is more swollen than usual Final Disposition User See Physician within 24 Hours Ronnald Ramp, RN, Miranda Comments Appt scheduled for today at [facility] with Dr. Jenny Reichmann.

## 2014-11-09 NOTE — Patient Instructions (Signed)
OK to stop the Hydrochlorothiazide 25 mg pill  Please take all new medication as prescribed - the lasix 20 mg per day  Please also start OTC Miralax daily 17 mg per day, for constipation  You can also take OTC dulcolox as needed for constipation  Please continue all other medications as before, and refills have been done if requested.  Please have the pharmacy call with any other refills you may need.  Please keep your appointments with your specialists as you may have planned  Please return in 2 weeks, or sooner if needed

## 2014-11-10 ENCOUNTER — Emergency Department (HOSPITAL_COMMUNITY): Payer: Commercial Managed Care - HMO

## 2014-11-10 ENCOUNTER — Emergency Department (HOSPITAL_BASED_OUTPATIENT_CLINIC_OR_DEPARTMENT_OTHER)
Admit: 2014-11-10 | Discharge: 2014-11-10 | Disposition: A | Discharge status: Home / Self Care | Payer: Commercial Managed Care - HMO | Attending: Emergency Medicine | Admitting: Emergency Medicine

## 2014-11-10 ENCOUNTER — Emergency Department (HOSPITAL_COMMUNITY)
Admission: EM | Admit: 2014-11-10 | Discharge: 2014-11-10 | Disposition: A | Discharge status: Home / Self Care | Payer: Commercial Managed Care - HMO | Attending: Emergency Medicine | Admitting: Emergency Medicine

## 2014-11-10 ENCOUNTER — Encounter (HOSPITAL_COMMUNITY): Payer: Self-pay | Admitting: *Deleted

## 2014-11-10 DIAGNOSIS — I10 Essential (primary) hypertension: Secondary | ICD-10-CM | POA: Insufficient documentation

## 2014-11-10 DIAGNOSIS — R4781 Slurred speech: Secondary | ICD-10-CM

## 2014-11-10 DIAGNOSIS — Z87891 Personal history of nicotine dependence: Secondary | ICD-10-CM | POA: Insufficient documentation

## 2014-11-10 DIAGNOSIS — M1990 Unspecified osteoarthritis, unspecified site: Secondary | ICD-10-CM | POA: Insufficient documentation

## 2014-11-10 DIAGNOSIS — Z8619 Personal history of other infectious and parasitic diseases: Secondary | ICD-10-CM | POA: Diagnosis not present

## 2014-11-10 DIAGNOSIS — K219 Gastro-esophageal reflux disease without esophagitis: Secondary | ICD-10-CM | POA: Insufficient documentation

## 2014-11-10 DIAGNOSIS — J449 Chronic obstructive pulmonary disease, unspecified: Secondary | ICD-10-CM | POA: Insufficient documentation

## 2014-11-10 DIAGNOSIS — Z21 Asymptomatic human immunodeficiency virus [HIV] infection status: Secondary | ICD-10-CM | POA: Insufficient documentation

## 2014-11-10 DIAGNOSIS — M7989 Other specified soft tissue disorders: Secondary | ICD-10-CM

## 2014-11-10 DIAGNOSIS — H9192 Unspecified hearing loss, left ear: Secondary | ICD-10-CM | POA: Insufficient documentation

## 2014-11-10 DIAGNOSIS — Z8546 Personal history of malignant neoplasm of prostate: Secondary | ICD-10-CM | POA: Insufficient documentation

## 2014-11-10 DIAGNOSIS — Z79899 Other long term (current) drug therapy: Secondary | ICD-10-CM | POA: Diagnosis not present

## 2014-11-10 DIAGNOSIS — Z8701 Personal history of pneumonia (recurrent): Secondary | ICD-10-CM | POA: Insufficient documentation

## 2014-11-10 DIAGNOSIS — R609 Edema, unspecified: Secondary | ICD-10-CM | POA: Diagnosis not present

## 2014-11-10 DIAGNOSIS — Z7982 Long term (current) use of aspirin: Secondary | ICD-10-CM | POA: Diagnosis not present

## 2014-11-10 DIAGNOSIS — F99 Mental disorder, not otherwise specified: Secondary | ICD-10-CM | POA: Diagnosis not present

## 2014-11-10 DIAGNOSIS — R410 Disorientation, unspecified: Secondary | ICD-10-CM | POA: Diagnosis not present

## 2014-11-10 DIAGNOSIS — R4701 Aphasia: Secondary | ICD-10-CM | POA: Diagnosis present

## 2014-11-10 LAB — CBC WITH DIFFERENTIAL/PLATELET
BASOS PCT: 2 % — AB (ref 0–1)
Basophils Absolute: 0.1 10*3/uL (ref 0.0–0.1)
EOS ABS: 0.2 10*3/uL (ref 0.0–0.7)
Eosinophils Relative: 3 % (ref 0–5)
HEMATOCRIT: 36 % — AB (ref 39.0–52.0)
HEMOGLOBIN: 11.7 g/dL — AB (ref 13.0–17.0)
LYMPHS ABS: 0.8 10*3/uL (ref 0.7–4.0)
Lymphocytes Relative: 15 % (ref 12–46)
MCH: 28.3 pg (ref 26.0–34.0)
MCHC: 32.5 g/dL (ref 30.0–36.0)
MCV: 87.2 fL (ref 78.0–100.0)
Monocytes Absolute: 0.6 10*3/uL (ref 0.1–1.0)
Monocytes Relative: 12 % (ref 3–12)
Neutro Abs: 3.6 10*3/uL (ref 1.7–7.7)
Neutrophils Relative %: 68 % (ref 43–77)
PLATELETS: 161 10*3/uL (ref 150–400)
RBC: 4.13 MIL/uL — AB (ref 4.22–5.81)
RDW: 15.6 % — AB (ref 11.5–15.5)
WBC: 5.3 10*3/uL (ref 4.0–10.5)

## 2014-11-10 LAB — I-STAT CHEM 8, ED
BUN: 46 mg/dL — ABNORMAL HIGH (ref 6–20)
CALCIUM ION: 1.24 mmol/L (ref 1.13–1.30)
CHLORIDE: 110 mmol/L (ref 101–111)
Creatinine, Ser: 2.1 mg/dL — ABNORMAL HIGH (ref 0.61–1.24)
GLUCOSE: 92 mg/dL (ref 65–99)
HCT: 39 % (ref 39.0–52.0)
Hemoglobin: 13.3 g/dL (ref 13.0–17.0)
Potassium: 3.1 mmol/L — ABNORMAL LOW (ref 3.5–5.1)
Sodium: 140 mmol/L (ref 135–145)
TCO2: 15 mmol/L (ref 0–100)

## 2014-11-10 NOTE — ED Notes (Signed)
Patient transported to CT 

## 2014-11-10 NOTE — ED Notes (Signed)
MD at bedside. 

## 2014-11-10 NOTE — ED Notes (Signed)
Pt presents from home alone via [facility], pt was on phone with social worker and was noted to be confused and having slurred speech, last known well unknown. Pt able to answer questions appropriately but having intermittent slurred speech with EMS.  Pt recently discharged from [facility] from [facility]hab post L hip surgery.  Pt states he in in a motorized wheelchair but PT working with him at home.  Pt reports increase in BL leg swelling, pt on Lasik, no SOB or CP.  Pt reports no new pain.  Grips equal and no drift.  EKG unremarkable, BP-126/84 P-62 NSR R-18 CBG-122.  Pt reports starting a new medication, unable to say which one.  Pt a x 4, NAD.  Speech clear and comprehensible.

## 2014-11-10 NOTE — ED Notes (Signed)
Patient transported to Vascular 

## 2014-11-10 NOTE — ED Notes (Signed)
Collected urine sample and placed bedside.

## 2014-11-10 NOTE — ED Provider Notes (Addendum)
CSN: [id number]     Arrival date & time 11/10/14  1025 History   First MD Initiated Contact with Patient 11/10/14 1026     Chief Complaint  Patient presents with  . Aphasia     (Consider location/radiation/quality/duration/timing/severity/associated sxs/prior Treatment) HPI Comments: Patient with history of hypertension, HIV, prior substance abuse, COPD and history of blood clots presents today with slurred speech that has been intermittent for the last 1 week. Patient states he recently got out of rehabilitation 1 week ago and since that time has noted slurred speech intermittently. Seems to be worse after taking his medications. He denies any swallowing difficulty or choking when attempting to eat. He recently has had multiple medication changes. He had a hip replacement done and while in the hospital had the addition of Lasix, iron, Zofran, Dulcolax, aspirin, baclofen. He saw his PCP yesterday due to constipation and was given medication that has resolved that issue.  He does complain of feeling drowsy during the day but denies any recent falls. He has had intermittent headaches but is not currently having a headache. He denies any unilateral weakness but has had ongoing pain in his hips.  He is also noted swelling in his lower extremities.  The history is provided by the patient.    Past Medical History  Diagnosis Date  . Hypertension   . HIV infection   . Substance abuse   . 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 Allergic rhinitis, cause unspecified 07/21/2011  . Cervical spondylosis 07/21/2011  . Lumbar spondylosis 07/21/2011  . Osteoarthritis 07/21/2011  . COPD (chronic obstructive pulmonary disease) 07/21/2011  . 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 Hx of radiation therapy 12/17/11 - 02/13/12    prostate  . Prostate cancer 09/19/11    gleason 7  . H/O blood clots   . Pneumonia   . GERD (gastroesophageal reflux disease)   . Hepatitis     Hep C  . Rectal bleeding   . Wears dentures     top   . Wears glasses    Past Surgical History  Procedure Laterality Date  . Hip surgery      total bilateral replacement  . Hernia repair    . Hand surgery      right thumb  . Foot surgery      bilat. ingrown toenails removed  . Colonoscopy N/A 11/17/2012    Procedure: COLONOSCOPY;  Surgeon: Ladene Artist, MD;  Location: [facility];  Service: Endoscopy;  Laterality: N/A;  possible APC  . Amputation Right 03/31/2014    Procedure: RIGHT SECOND TOE AMPUTATION ;  Surgeon: Wylene Simmer, MD;  Location: [facility];  Service: Orthopedics;  Laterality: Right;  . Total hip revision Left 10/08/2014    Procedure: POSTERIOR LEFT TOTAL HIP REVISION;  Surgeon: Paralee Cancel, MD;  Location: [facility];  Service: Orthopedics;  Laterality: Left;   Family History  Problem Relation Age of Onset  . Alcohol abuse Other   . Arthritis Other   . Hypertension Other   . Colon cancer Neg Hx   . Diabetes Neg Hx   . Cancer Mother     unsure of type   History  Substance Use Topics  . Smoking status: Former Smoker -- 1.00 packs/day for 10 years    Types: Cigarettes    Quit date: 10/13/1960  . Smokeless tobacco: Never Used  . Alcohol Use: No    Review of Systems  All other systems reviewed and are negative.  Allergies  Review of patient's allergies indicates no known allergies.  Home Medications   Prior to Admission medications   Medication Sig Start Date End Date Taking? Authorizing Provider  acetaminophen (TYLENOL) 325 MG tablet Take 2 tablets (650 mg total) by mouth every 6 (six) hours as needed for mild pain (or Fever >/= 101). 10/13/14   Robbie Lis, MD  aspirin EC 325 MG EC tablet Take 1 tablet (325 mg total) by mouth 2 (two) times daily. Patient not taking: Reported on 11/09/2014 10/13/14   Robbie Lis, MD  baclofen (LIORESAL) 10 MG tablet Take 10 mg by mouth 3 (three) times daily as needed for muscle spasms.    Historical Provider, MD  bisacodyl (DULCOLAX) 5 MG EC tablet Take 1  tablet (5 mg total) by mouth daily as needed for moderate constipation. Patient not taking: Reported on 11/09/2014 10/13/14   Robbie Lis, MD  cholecalciferol (VITAMIN D) 1000 UNITS tablet Take 1,000 Units by mouth every evening.    Historical Provider, MD  ciprofloxacin (CIPRO) 500 MG tablet Take 1 tablet (500 mg total) by mouth 2 (two) times daily. Patient not taking: Reported on 11/09/2014 10/13/14   Robbie Lis, MD  efavirenz-emtricitabine-tenofovir (ATRIPLA) 025-852-778 MG per tablet Take 1 tablet by mouth at bedtime. 09/12/14   Biagio Borg, MD  enalapril (VASOTEC) 20 MG tablet Take 1 tablet (20 mg total) by mouth daily. 11/09/14   Biagio Borg, MD  feeding supplement, ENSURE COMPLETE, (ENSURE COMPLETE) LIQD Take 237 mLs by mouth 2 (two) times daily between meals. Patient not taking: Reported on 11/09/2014 04/19/14   Biagio Borg, MD  ferrous sulfate 325 (65 FE) MG tablet Take 1 tablet (325 mg total) by mouth 2 (two) times daily with a meal. 10/13/14   Robbie Lis, MD  furosemide (LASIX) 20 MG tablet Take 1 tablet (20 mg total) by mouth daily. 11/09/14   Biagio Borg, MD  naproxen (NAPROSYN) 500 MG tablet  11/05/14   Historical Provider, MD  ondansetron (ZOFRAN) 4 MG tablet Take 1 tablet (4 mg total) by mouth every 6 (six) hours as needed for nausea. 10/13/14   Robbie Lis, MD  tamsulosin [facility]) 0.4 MG CAPS take 1 capsule by mouth twice a day 10/01/12   Franchot Gallo, MD  traMADol (ULTRAM) 50 MG tablet TAKE 1 TABLET BY MOUTH  EVERY 6 HOURS AS NEEDED 11/08/14   Biagio Borg, MD  vitamin A 10000 UNIT capsule Take 10,000 Units by mouth every evening.    Historical Provider, MD  vitamin C (ASCORBIC ACID) 500 MG tablet Take 500 mg by mouth every evening.    Historical Provider, MD   BP 126/93 mmHg  Pulse 71  Temp(Src) 98.2 F (36.8 C) (Oral)  Resp 18  Ht 5\' 8"  (1.727 m)  Wt 170 lb (77.111 kg)  BMI 25.85 kg/m2  SpO2 99% Physical Exam  Constitutional: He is oriented to person, place, and time. He  appears well-developed and well-nourished. No distress.  HENT:  Head: Normocephalic and atraumatic.  Mouth/Throat: Oropharynx is clear and moist.  Eyes: Conjunctivae and EOM are normal. Pupils are equal, round, and reactive to light.  Neck: Normal range of motion. Neck supple.  Cardiovascular: Normal rate, regular rhythm and intact distal pulses.   No murmur heard. Pulmonary/Chest: Effort normal and breath sounds normal. No respiratory distress. He has no wheezes. He has no rales.  Abdominal: Soft. He exhibits no distension. There is no tenderness. There  is no rebound and no guarding.  Musculoskeletal: Normal range of motion. He exhibits edema. He exhibits no tenderness.  Left > right pitting edema 2+  Neurological: He is alert and oriented to person, place, and time. He has normal strength. No cranial nerve deficit or sensory deficit.  Slight slurred speech without aphasia.  Normal tongue movement without fasciculation  Skin: Skin is warm and dry. No rash noted. No erythema.  Psychiatric: He has a normal mood and affect. His behavior is normal.  Nursing note and vitals reviewed.   ED Course  Procedures (including critical care time) Labs Review Labs Reviewed  CBC WITH DIFFERENTIAL/PLATELET - Abnormal; Notable for the following:    RBC 4.13 (*)    Hemoglobin 11.7 (*)    HCT 36.0 (*)    RDW 15.6 (*)    Basophils Relative 2 (*)    All other components within normal limits  I-STAT CHEM 8, ED - Abnormal; Notable for the following:    Potassium 3.1 (*)    BUN 46 (*)    Creatinine, Ser 2.10 (*)    All other components within normal limits    Imaging Review No results found.   EKG Interpretation   Date/Time:  Thursday November 10 2014 10:34:49 EDT Ventricular Rate:  64 PR Interval:  153 QRS Duration: 91 QT Interval:  409 QTC Calculation: 422 R Axis:   3 Text Interpretation:  Sinus rhythm Borderline T wave abnormalities No  significant change since last tracing Confirmed by  Maryan Rued  MD, Loree Fee  [phone]) on 11/10/2014 10:46:15 AM      MDM   Final diagnoses:  Slurred speech    Patient here with a significant history for hypertension, HIV, COPD, recent hip replacement who presents today with slurred speech that's been intermittent for the last 1 week. Patient feels that it is related to the medications he is on. Recently after last hospitalization multiple medications were changed was taking more than he did in the past.  He denies any unilateral weakness, swallowing difficulty, a fascia, sensory deficits. No choking while he eats and denies any shortness of breath. Patient does not take any anticoagulation. On exam he is awake alert and oriented. He is neurovascularly intact. Slightly slurred speech but normal tongue movements and normal strength and sensation.  Low suspicion that this is stroke related however patient did recently have a fall in possible that this could be a slowly evolving subdural hematoma. Most likely that this is medication related as patient does take baclofen and tramadol which are both new medications.    EKG without acute findings. CBC, i-STAT Chem-8, head CT pending.  Due to lower ext swelling and recent surgery will also get duplex to r/o clot  1:11 PM Imaging is negative for new head bleed and no evidence of stroke. Patient passed his swallow screen and currently there is no slurred speech. Patient was found to have a slightly elevated creatinine today of 2.1 from a baseline of 1.2. He has been taking Vasotec and Lasix for quite some time however recommended that he have his creatinine rechecked in approximately 1-2 weeks with his primary doctor. At this time feel patient has been reasonably screened and can be discharged home.  Blanchie Dessert, MD 11/10/14 1312  Blanchie Dessert, MD 11/10/14 1314

## 2014-11-10 NOTE — ED Notes (Signed)
Pt ambulatory with assistance to discharge with PTAR.  NAD.

## 2014-11-10 NOTE — Progress Notes (Signed)
VASCULAR LAB PRELIMINARY  PRELIMINARY  PRELIMINARY  PRELIMINARY  Bilateral lower extremity venous Dopplers completed.    Preliminary report:  There is no DVT or SVT noted in the bilateral lower extremities.   Michaela Shankel, RVT 11/10/2014, 12:13 PM

## 2014-11-11 ENCOUNTER — Inpatient Hospital Stay: Payer: Commercial Managed Care - HMO | Admitting: Internal Medicine

## 2014-11-11 ENCOUNTER — Telehealth: Payer: Self-pay | Admitting: *Deleted

## 2014-11-11 NOTE — Telephone Encounter (Signed)
[facility] Patient Name: Billy Lopez Gender: Male DOB: Dec 19, 1940 Age: 74 Y 70 M Return Phone Number: [phone] (Primary) Address: City/State/Zip: [place] [address] Client [facility] - Client Client Site [facility] - Day Physician [name] Contact Type Call Call Type Triage / Clinical Relationship To Patient Self Appointment Disposition EMR Appointment Scheduled Info pasted into Epic Yes Return Phone Number [phone] (Primary) Chief Complaint Constipation Initial Comment Caller states had surgery on Friday, hasn't had a BM in 2 days. stomach is bloated PreDisposition Call Doctor Nurse Assessment Nurse: Ronnald Ramp, RN, Miranda Date/Time Eilene Ghazi Time): 11/09/2014 10:01:46 AM Confirm and document reason for call. If symptomatic, describe symptoms. ---Caller states he has not had a BM in 2 days. He had hip surgery on Friday. Has the patient traveled out of the country within the last 30 days? ---Not Applicable Does the patient require triage? ---Yes Related visit to physician within the last 2 weeks? ---No Does the PT have any chronic conditions? (i.e. diabetes, asthma, etc.) ---Yes List chronic conditions. ---HIV Guidelines Guideline Title Affirmed Question Affirmed Notes Nurse Date/Time (Eastern Time) Constipation Abdomen is more swollen than usual Ronnald Ramp, RN, Miranda 11/09/2014 10:04:23 AM Disp. Time Eilene Ghazi Time) Disposition Final User 11/09/2014 10:07:56 AM See Physician within 24 Hours Yes Ronnald Ramp, RN, Judge Stall Understands: Yes Disagree/Comply: Comply Care Advice Given Per Guideline PLEASE NOTE: All timestamps contained within this report are represented as [place] Standard Time. CONFIDENTIALTY NOTICE: This fax transmission is intended only for the addressee. It contains information that is legally privileged, confidential or otherwise protected  from use or disclosure. If you are not the intended recipient, you are strictly prohibited from reviewing, disclosing, copying using or disseminating any of this information or taking any action in reliance on or regarding this information. If you have received this fax in error, please notify us immediately by telephone so that we can arrange for its return to [place]. Phone: [phone], Toll-Free: [phone], Fax: [phone] Page: 2 of 2 Call Id: [id number] Care Advice Given Per Guideline SEE PHYSICIAN WITHIN 24 HOURS: CALL BACK IF: * Severe or increasing abdominal pain * Constant abdominal pain lasting over 2 hours * Vomiting occurs CARE ADVICE given per Constipation (Adult) guideline. * You become worse. After Care Instructions Given Call Event Type User Date / Time Description Comments User: Leverne Humbles, RN Date/Time Eilene Ghazi Time): 11/09/2014 10:08:29 AM Appt scheduled for today at 6PM with Dr. Jenny Reichmann. Referrals REFERRED TO PCP OFFICE

## 2014-11-15 DIAGNOSIS — Z471 Aftercare following joint replacement surgery: Secondary | ICD-10-CM | POA: Diagnosis not present

## 2014-11-15 DIAGNOSIS — J449 Chronic obstructive pulmonary disease, unspecified: Secondary | ICD-10-CM | POA: Diagnosis not present

## 2014-11-15 DIAGNOSIS — T84031D Mechanical loosening of internal left hip prosthetic joint, subsequent encounter: Secondary | ICD-10-CM | POA: Diagnosis not present

## 2014-11-15 DIAGNOSIS — I10 Essential (primary) hypertension: Secondary | ICD-10-CM | POA: Diagnosis not present

## 2014-11-15 DIAGNOSIS — B182 Chronic viral hepatitis C: Secondary | ICD-10-CM | POA: Diagnosis not present

## 2014-11-15 DIAGNOSIS — N182 Chronic kidney disease, stage 2 (mild): Secondary | ICD-10-CM | POA: Diagnosis not present

## 2014-11-15 DIAGNOSIS — R2689 Other abnormalities of gait and mobility: Secondary | ICD-10-CM | POA: Diagnosis not present

## 2014-11-15 DIAGNOSIS — Z9181 History of falling: Secondary | ICD-10-CM | POA: Diagnosis not present

## 2014-11-23 ENCOUNTER — Other Ambulatory Visit (INDEPENDENT_AMBULATORY_CARE_PROVIDER_SITE_OTHER): Payer: Commercial Managed Care - HMO

## 2014-11-23 ENCOUNTER — Encounter: Payer: Self-pay | Admitting: Internal Medicine

## 2014-11-23 ENCOUNTER — Ambulatory Visit (INDEPENDENT_AMBULATORY_CARE_PROVIDER_SITE_OTHER): Payer: Commercial Managed Care - HMO | Admitting: Internal Medicine

## 2014-11-23 ENCOUNTER — Other Ambulatory Visit: Payer: Self-pay | Admitting: Internal Medicine

## 2014-11-23 VITALS — BP 92/60 | HR 63 | Temp 97.9°F | Ht 71.0 in | Wt 167.0 lb

## 2014-11-23 DIAGNOSIS — R609 Edema, unspecified: Secondary | ICD-10-CM

## 2014-11-23 DIAGNOSIS — K5900 Constipation, unspecified: Secondary | ICD-10-CM | POA: Diagnosis not present

## 2014-11-23 DIAGNOSIS — I10 Essential (primary) hypertension: Secondary | ICD-10-CM | POA: Diagnosis not present

## 2014-11-23 DIAGNOSIS — J449 Chronic obstructive pulmonary disease, unspecified: Secondary | ICD-10-CM

## 2014-11-23 LAB — HEPATIC FUNCTION PANEL
ALBUMIN: 3.9 g/dL (ref 3.5–5.2)
ALK PHOS: 174 U/L — AB (ref 39–117)
ALT: 14 U/L (ref 0–53)
AST: 23 U/L (ref 0–37)
Bilirubin, Direct: 0 mg/dL (ref 0.0–0.3)
Total Bilirubin: 0.3 mg/dL (ref 0.2–1.2)
Total Protein: 7.7 g/dL (ref 6.0–8.3)

## 2014-11-23 LAB — BASIC METABOLIC PANEL
BUN: 67 mg/dL — ABNORMAL HIGH (ref 6–23)
CO2: 22 meq/L (ref 19–32)
Calcium: 8.8 mg/dL (ref 8.4–10.5)
Chloride: 103 mEq/L (ref 96–112)
Creatinine, Ser: 2.16 mg/dL — ABNORMAL HIGH (ref 0.40–1.50)
GFR: 38.8 mL/min — ABNORMAL LOW (ref >60.00)
GLUCOSE: 98 mg/dL (ref 70–99)
Potassium: 3 mEq/L — ABNORMAL LOW (ref 3.5–5.1)
SODIUM: 134 meq/L — AB (ref 135–145)

## 2014-11-23 MED ORDER — POTASSIUM CHLORIDE ER 10 MEQ PO TBCR: 10.0000 meq | EXTENDED_RELEASE_TABLET | Freq: Every day | ORAL | Status: DC

## 2014-11-23 NOTE — Progress Notes (Signed)
Pre visit review using our clinic review tool, if applicable. No additional management support is needed unless otherwise documented below in the visit note. 

## 2014-11-23 NOTE — Assessment & Plan Note (Signed)
Improved,  to f/u any worsening symptoms or concerns 

## 2014-11-23 NOTE — Progress Notes (Signed)
Subjective:    Patient ID: Billy Lopez., male    DOB: 1940/10/19, 74 y.o.   MRN: [id number]  HPI    Here to f/u; overall doing ok,  Pt denies chest pain, increasing sob or doe, wheezing, orthopnea, PND, increased LE swelling, palpitations, dizziness or syncope.  Pt denies new neurological symptoms such as new headache, or facial or extremity weakness or numbness.  Pt denies polydipsia, polyuria, or low sugar episode.   Pt denies new neurological symptoms such as new headache, or facial or extremity weakness or numbness.   Pt states overall good compliance with meds.  LE edema now resolved.  Constipation now improved, Denies worsening reflux, abd pain, dysphagia, n/v, bowel change or blood.  Mentions home power wheelchair may not working as well, slowing down, may need to be replaced, but pt advised to consut with home care (has [facility])  Pt also asks for repeat hepatic funciton with hx of mild transaminitis, last lft's no v 2015 normal.  Past Medical History  Diagnosis Date  . Hypertension   . HIV infection   . Substance abuse   . 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 Allergic rhinitis, cause unspecified 07/21/2011  . Cervical spondylosis 07/21/2011  . Lumbar spondylosis 07/21/2011  . Osteoarthritis 07/21/2011  . COPD (chronic obstructive pulmonary disease) 07/21/2011  . 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 Hx of radiation therapy 12/17/11 - 02/13/12    prostate  . Prostate cancer 09/19/11    gleason 7  . H/O blood clots   . Pneumonia   . GERD (gastroesophageal reflux disease)   . Hepatitis     Hep C  . Rectal bleeding   . Wears dentures     top  . Wears glasses    Past Surgical History  Procedure Laterality Date  . Hip surgery      total bilateral replacement  . Hernia repair    . Hand surgery      right thumb  . Foot surgery      bilat. ingrown toenails removed  . Colonoscopy N/A 11/17/2012    Procedure: COLONOSCOPY;  Surgeon: Ladene Artist, MD;  Location: [facility];  Service:  Endoscopy;  Laterality: N/A;  possible APC  . Amputation Right 03/31/2014    Procedure: RIGHT SECOND TOE AMPUTATION ;  Surgeon: Wylene Simmer, MD;  Location: [facility];  Service: Orthopedics;  Laterality: Right;  . Total hip revision Left 10/08/2014    Procedure: POSTERIOR LEFT TOTAL HIP REVISION;  Surgeon: Paralee Cancel, MD;  Location: [facility];  Service: Orthopedics;  Laterality: Left;    reports that he quit smoking about 54 years ago. His smoking use included Cigarettes. He has a 10 pack-year smoking history. He has never used smokeless tobacco. He reports that he does not drink alcohol or use illicit drugs. family history includes Alcohol abuse in his other; Arthritis in his other; Cancer in his mother; Hypertension in his other. There is no history of Colon cancer or Diabetes. No Known Allergies Current Outpatient Prescriptions on File Prior to Visit  Medication Sig Dispense Refill  . acetaminophen (TYLENOL) 325 MG tablet Take 2 tablets (650 mg total) by mouth every 6 (six) hours as needed for mild pain (or Fever >/= 101). 30 tablet 0  . aspirin EC 325 MG EC tablet Take 1 tablet (325 mg total) by mouth 2 (two) times daily. 60 tablet 0  . baclofen (LIORESAL) 10 MG tablet Take 10 mg by mouth 3 (three)  times daily as needed for muscle spasms.    . bisacodyl (DULCOLAX) 5 MG EC tablet Take 1 tablet (5 mg total) by mouth daily as needed for moderate constipation. 30 tablet 0  . cholecalciferol (VITAMIN D) 1000 UNITS tablet Take 1,000 Units by mouth every evening.    Marland Kitchen efavirenz-emtricitabine-tenofovir (ATRIPLA) 600-200-300 MG per tablet Take 1 tablet by mouth at bedtime. 30 tablet 8  . enalapril (VASOTEC) 20 MG tablet Take 1 tablet (20 mg total) by mouth daily. 90 tablet 3  . feeding supplement, ENSURE COMPLETE, (ENSURE COMPLETE) LIQD Take 237 mLs by mouth 2 (two) times daily between meals. 474 mL 11  . ferrous sulfate 325 (65 FE) MG tablet Take 1 tablet (325 mg total) by mouth 2 (two)  times daily with a meal. 60 tablet 0  . furosemide (LASIX) 20 MG tablet Take 1 tablet (20 mg total) by mouth daily. 30 tablet 11  . naproxen (NAPROSYN) 500 MG tablet   1  . ondansetron (ZOFRAN) 4 MG tablet Take 1 tablet (4 mg total) by mouth every 6 (six) hours as needed for nausea. 20 tablet 0  . tamsulosin (FLOMAX) 0.4 MG CAPS take 1 capsule by mouth twice a day 60 capsule 0  . traMADol (ULTRAM) 50 MG tablet TAKE 1 TABLET BY MOUTH  EVERY 6 HOURS AS NEEDED 120 tablet 5  . vitamin A 10000 UNIT capsule Take 10,000 Units by mouth every evening.    . vitamin C (ASCORBIC ACID) 500 MG tablet Take 500 mg by mouth every evening.    . ciprofloxacin (CIPRO) 500 MG tablet Take 1 tablet (500 mg total) by mouth 2 (two) times daily. (Patient not taking: Reported on 11/09/2014) 10 tablet 0   No current facility-administered medications on file prior to visit.   Review of Systems  Constitutional: Negative for unusual diaphoresis or night sweats HENT: Negative for ringing in ear or discharge Eyes: Negative for double vision or worsening visual disturbance.  Respiratory: Negative for choking and stridor.   Gastrointestinal: Negative for vomiting or other signifcant bowel change Genitourinary: Negative for hematuria or change in urine volume.  Musculoskeletal: Negative for other MSK pain or swelling Skin: Negative for color change and worsening wound.  Neurological: Negative for tremors and numbness other than noted  Psychiatric/Behavioral: Negative for decreased concentration or agitation other than above       Objective:   Physical Exam BP 92/60 mmHg  Pulse 63  Temp(Src) 97.9 F (36.6 C) (Oral)  Ht 5\' 11"  (1.803 m)  Wt 167 lb (75.751 kg)  BMI 23.30 kg/m2  SpO2 95% VS noted,  Constitutional: Pt appears in no significant distress HENT: Head: NCAT.  Right Ear: External ear normal.  Left Ear: External ear normal.  Eyes: . Pupils are equal, round, and reactive to light. Conjunctivae and EOM are  normal Neck: Normal range of motion. Neck supple.  Cardiovascular: Normal rate and regular rhythm.   Pulmonary/Chest: Effort normal and breath sounds without rales or wheezing.  Abd: soft, NT, +BS Neurological: Pt is alert. Not confused , motor grossly intact Skin: Skin is warm. No rash, no LE edema Psychiatric: Pt behavior is normal. No agitation.     Assessment & Plan:

## 2014-11-23 NOTE — Patient Instructions (Addendum)
OK to change the furosemide fluid pill (lasix) 20 mg to AS NEEDED 1 per day if any swelling tends to try to come back  Please continue all other medications as before, and refills have been done if requested.  Please have the pharmacy call with any other refills you may need.  Please keep your appointments with your specialists as you may have planned  Please go to the LAB in the Basement (turn left off the elevator) for the tests to be done today  You will be contacted by phone if any changes need to be made immediately.  Otherwise, you will receive a letter about your results with an explanation, but please check with MyChart first.  Please return in 6 months, or sooner if needed

## 2014-11-23 NOTE — Assessment & Plan Note (Signed)
stable overall by history and exam, recent data reviewed with pt, and pt to continue medical treatment as before,  to f/u any worsening symptoms or concerns BP Readings from Last 3 Encounters:  11/23/14 92/60  11/10/14 103/72  11/09/14 112/64

## 2014-11-23 NOTE — Assessment & Plan Note (Addendum)
Resolved, to cont the lasix 20 qd prn only,  to f/u any worsening symptoms or concerns, also for BMET for f/u K and bun/cr

## 2014-11-23 NOTE — Assessment & Plan Note (Signed)
stable overall by history and exam, recent data reviewed with pt, and pt to continue medical treatment as before,  to f/u any worsening symptoms or concerns SpO2 Readings from Last 3 Encounters:  11/23/14 95%  11/10/14 96%  11/09/14 97%

## 2014-11-25 DIAGNOSIS — Z471 Aftercare following joint replacement surgery: Secondary | ICD-10-CM | POA: Diagnosis not present

## 2014-11-25 DIAGNOSIS — Z96642 Presence of left artificial hip joint: Secondary | ICD-10-CM | POA: Diagnosis not present

## 2014-12-29 ENCOUNTER — Telehealth: Payer: Self-pay | Admitting: Internal Medicine

## 2014-12-29 NOTE — Telephone Encounter (Signed)
Pt called stated Dr. Elliot Cousin  ( hip surgeon) took him off of hydrocodone and he was wondering if Dr. Jenny Reichmann can put him back on. Pt stated he had hip suregery last month and that's when Dr. Alvan Dame took him off. Please call pt

## 2014-12-29 NOTE — Telephone Encounter (Signed)
Pt advised and states that he has already been referred to pain mgmt and is waiting for an appt

## 2014-12-29 NOTE — Telephone Encounter (Signed)
Very sorry, but I do not practice chronic pain medicine anymore; I can refer to pain clinic if he wants

## 2015-01-05 DIAGNOSIS — B20 Human immunodeficiency virus [HIV] disease: Secondary | ICD-10-CM | POA: Diagnosis not present

## 2015-01-05 DIAGNOSIS — G8929 Other chronic pain: Secondary | ICD-10-CM | POA: Diagnosis not present

## 2015-01-05 DIAGNOSIS — I10 Essential (primary) hypertension: Secondary | ICD-10-CM | POA: Diagnosis not present

## 2015-01-05 DIAGNOSIS — M1990 Unspecified osteoarthritis, unspecified site: Secondary | ICD-10-CM | POA: Diagnosis not present

## 2015-01-06 DIAGNOSIS — Z471 Aftercare following joint replacement surgery: Secondary | ICD-10-CM | POA: Diagnosis not present

## 2015-01-06 DIAGNOSIS — Z96642 Presence of left artificial hip joint: Secondary | ICD-10-CM | POA: Diagnosis not present

## 2015-01-30 ENCOUNTER — Telehealth: Payer: Self-pay | Admitting: *Deleted

## 2015-01-30 NOTE — Telephone Encounter (Signed)
Receive call pt is requesting refill on his tramadol to be sent to [facility]. Inform pt md is out of the office today. Will give him a call back tomorrow once he has respond...Billy Lopez

## 2015-01-31 MED ORDER — TRAMADOL HCL 50 MG PO TABS: ORAL_TABLET | ORAL | Status: DC

## 2015-01-31 NOTE — Telephone Encounter (Signed)
Done hardcopy to [name]  

## 2015-01-31 NOTE — Telephone Encounter (Signed)
Notified pt md ok tramadol rx fax to [facility]...Billy Lopez

## 2015-01-31 NOTE — Telephone Encounter (Signed)
Please confirm with pt that he is no longer taking hydrocodone or oxycodone, as these medications should not be taken together  After is response, I will check the [place] controlled substance registry

## 2015-01-31 NOTE — Telephone Encounter (Signed)
Called pt gave md response pt states he is not taking hydrocodone or oxycodone. He only took the oxycodone back in march when he had foot surgery...Billy Lopez

## 2015-02-07 DIAGNOSIS — Z0279 Encounter for issue of other medical certificate: Secondary | ICD-10-CM

## 2015-02-22 ENCOUNTER — Encounter: Payer: Self-pay | Admitting: Internal Medicine

## 2015-02-22 ENCOUNTER — Other Ambulatory Visit (INDEPENDENT_AMBULATORY_CARE_PROVIDER_SITE_OTHER): Payer: Commercial Managed Care - HMO

## 2015-02-22 ENCOUNTER — Ambulatory Visit (INDEPENDENT_AMBULATORY_CARE_PROVIDER_SITE_OTHER): Payer: Commercial Managed Care - HMO | Admitting: Internal Medicine

## 2015-02-22 ENCOUNTER — Other Ambulatory Visit: Payer: Self-pay | Admitting: Internal Medicine

## 2015-02-22 VITALS — BP 96/58 | HR 95 | Temp 98.2°F | Ht 71.0 in | Wt 157.0 lb

## 2015-02-22 DIAGNOSIS — C61 Malignant neoplasm of prostate: Secondary | ICD-10-CM

## 2015-02-22 DIAGNOSIS — G8929 Other chronic pain: Secondary | ICD-10-CM | POA: Diagnosis not present

## 2015-02-22 DIAGNOSIS — Z0000 Encounter for general adult medical examination without abnormal findings: Secondary | ICD-10-CM

## 2015-02-22 DIAGNOSIS — C7951 Secondary malignant neoplasm of bone: Principal | ICD-10-CM

## 2015-02-22 DIAGNOSIS — Z23 Encounter for immunization: Secondary | ICD-10-CM

## 2015-02-22 LAB — BASIC METABOLIC PANEL
BUN: 29 mg/dL — ABNORMAL HIGH (ref 6–23)
CO2: 23 meq/L (ref 19–32)
Calcium: 9.6 mg/dL (ref 8.4–10.5)
Chloride: 102 mEq/L (ref 96–112)
Creatinine, Ser: 1.78 mg/dL — ABNORMAL HIGH (ref 0.40–1.50)
GFR: 48.48 mL/min — AB (ref >60.00)
GLUCOSE: 119 mg/dL — AB (ref 70–99)
POTASSIUM: 3.9 meq/L (ref 3.5–5.1)
SODIUM: 133 meq/L — AB (ref 135–145)

## 2015-02-22 LAB — CBC WITH DIFFERENTIAL/PLATELET
BASOS ABS: 0.1 10*3/uL (ref 0.0–0.1)
Basophils Relative: 0.9 % (ref 0.0–3.0)
EOS ABS: 0 10*3/uL (ref 0.0–0.7)
Eosinophils Relative: 0.5 % (ref 0.0–5.0)
HEMATOCRIT: 41.9 % (ref 39.0–52.0)
Hemoglobin: 14.2 g/dL (ref 13.0–17.0)
LYMPHS PCT: 18 % (ref 12.0–46.0)
Lymphs Abs: 1.2 10*3/uL (ref 0.7–4.0)
MCHC: 33.9 g/dL (ref 30.0–36.0)
MCV: 85.8 fl (ref 78.0–100.0)
Monocytes Absolute: 1 10*3/uL (ref 0.1–1.0)
Monocytes Relative: 15.1 % — ABNORMAL HIGH (ref 3.0–12.0)
NEUTROS ABS: 4.4 10*3/uL (ref 1.4–7.7)
Neutrophils Relative %: 65.5 % (ref 43.0–77.0)
PLATELETS: 271 10*3/uL (ref 150.0–400.0)
RBC: 4.88 Mil/uL (ref 4.22–5.81)
RDW: 15.2 % (ref 11.5–15.5)
WBC: 6.8 10*3/uL (ref 4.0–10.5)

## 2015-02-22 LAB — URINALYSIS, ROUTINE W REFLEX MICROSCOPIC
Bilirubin Urine: NEGATIVE
Hgb urine dipstick: NEGATIVE
Ketones, ur: NEGATIVE
Leukocytes, UA: NEGATIVE
NITRITE: NEGATIVE
PH: 6 (ref 5.0–8.0)
RBC / HPF: NONE SEEN (ref 0–?)
SPECIFIC GRAVITY, URINE: 1.01 (ref 1.000–1.030)
Total Protein, Urine: NEGATIVE
URINE GLUCOSE: NEGATIVE
UROBILINOGEN UA: 0.2 (ref 0.0–1.0)
WBC, UA: NONE SEEN (ref 0–?)

## 2015-02-22 LAB — HEPATIC FUNCTION PANEL
ALBUMIN: 4.4 g/dL (ref 3.5–5.2)
ALK PHOS: 306 U/L — AB (ref 39–117)
ALT: 13 U/L (ref 0–53)
AST: 25 U/L (ref 0–37)
Bilirubin, Direct: 0.1 mg/dL (ref 0.0–0.3)
Total Bilirubin: 0.4 mg/dL (ref 0.2–1.2)
Total Protein: 8 g/dL (ref 6.0–8.3)

## 2015-02-22 LAB — LIPID PANEL
CHOLESTEROL: 244 mg/dL — AB (ref 0–200)
HDL: 51.6 mg/dL (ref >39.00)
LDL CALC: 161 mg/dL — AB (ref 0–99)
NonHDL: 192.01
Total CHOL/HDL Ratio: 5
Triglycerides: 154 mg/dL — ABNORMAL HIGH (ref 0.0–149.0)
VLDL: 30.8 mg/dL (ref 0.0–40.0)

## 2015-02-22 LAB — PSA

## 2015-02-22 LAB — TSH: TSH: 1.76 u[IU]/mL (ref 0.35–4.50)

## 2015-02-22 MED ORDER — HYDROCODONE-ACETAMINOPHEN 10-325 MG PO TABS: ORAL_TABLET | ORAL | Status: DC

## 2015-02-22 NOTE — Patient Instructions (Addendum)
You had the flu shot today  Please call with the name of your pain clinic approved by [facility]  Please continue all other medications as before, and refills have been done if requested  - the hydrocodone  Please be aware I will not be able to prescribe the hydrocodone long term  Please have the pharmacy call with any other refills you may need.  Please continue your efforts at being more active, low cholesterol diet, and weight control.  You are otherwise up to date with prevention measures today.  Please keep your appointments with your specialists as you may have planned  Please go to the LAB in the Basement (turn left off the elevator) for the tests to be done today  You will be contacted by phone if any changes need to be made immediately.  Otherwise, you will receive a letter about your results with an explanation, but please check with MyChart first.  Please remember to sign up for MyChart if you have not done so, as this will be important to you in the future with finding out test results, communicating by private email, and scheduling acute appointments online when needed.

## 2015-02-22 NOTE — Progress Notes (Signed)
Subjective:    Patient ID: Billy Brink., male    DOB: 28-Aug-1940, 74 y.o.   MRN: [id number]  HPI   Here for wellness and f/u;  Overall doing ok;  Pt denies Chest pain, worsening SOB, DOE, wheezing, orthopnea, PND, worsening LE edema, palpitations, dizziness or syncope.  Pt denies neurological change such as new headache, facial or extremity weakness.  Pt denies polydipsia, polyuria, or low sugar symptoms. Pt states overall good compliance with treatment and medications, good tolerability, and has been trying to follow appropriate diet.  Pt denies worsening depressive symptoms, suicidal ideation or panic. No fever, night sweats, wt loss, loss of appetite, or other constitutional symptoms.  Pt states good ability with ADL's, has low fall risk, home safety reviewed and adequate, no other significant changes in hearing or vision, and only occasionally active with exercise.  Declines tetanus Also c/o ongoing pain , was referred to pain management previously 8 mo ago, but pt states there are admin problems that got in the way, something about a name incorrently on a card related to [facility] system.  [employer] has an approved pain MD but pt left name at home.  Pt denies chest pain, increased sob or doe, wheezing, orthopnea, PND, increased LE swelling, palpitations, dizziness or syncope.  Pain worse with weather changes, especially with chronic hip pain after 2 surguries.  Pt continues to have recurring LBP without change in severity, bowel or bladder change, fever, wt loss,  worsening LE pain/numbness/weakness, gait change or falls. Hearing difficulty due to broken hearing aid. Past Medical History  Diagnosis Date  . Hypertension   . HIV infection   . Substance abuse   . 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 Allergic rhinitis, cause unspecified 07/21/2011  . Cervical spondylosis 07/21/2011  . Lumbar spondylosis 07/21/2011  . Osteoarthritis 07/21/2011  . COPD (chronic obstructive pulmonary disease) 07/21/2011  .  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 Hx of radiation therapy 12/17/11 - 02/13/12    prostate  . Prostate cancer 09/19/11    gleason 7  . H/O blood clots   . Pneumonia   . GERD (gastroesophageal reflux disease)   . Hepatitis     Hep C  . Rectal bleeding   . Wears dentures     top  . Wears glasses    Past Surgical History  Procedure Laterality Date  . Hip surgery      total bilateral replacement  . Hernia repair    . Hand surgery      right thumb  . Foot surgery      bilat. ingrown toenails removed  . Colonoscopy N/A 11/17/2012    Procedure: COLONOSCOPY;  Surgeon: Ladene Artist, MD;  Location: [facility];  Service: Endoscopy;  Laterality: N/A;  possible APC  . Amputation Right 03/31/2014    Procedure: RIGHT SECOND TOE AMPUTATION ;  Surgeon: Wylene Simmer, MD;  Location: [place];  Service: Orthopedics;  Laterality: Right;  . Total hip revision Left 10/08/2014    Procedure: POSTERIOR LEFT TOTAL HIP REVISION;  Surgeon: Paralee Cancel, MD;  Location: [facility];  Service: Orthopedics;  Laterality: Left;    reports that he quit smoking about 54 years ago. His smoking use included Cigarettes. He has a 10 pack-year smoking history. He has never used smokeless tobacco. He reports that he does not drink alcohol or use illicit drugs. family history includes Alcohol abuse in his other; Arthritis in his other; Cancer in his mother; Hypertension in  his other. There is no history of Colon cancer or Diabetes. No Known Allergies Current Outpatient Prescriptions on File Prior to Visit  Medication Sig Dispense Refill  . acetaminophen (TYLENOL) 325 MG tablet Take 2 tablets (650 mg total) by mouth every 6 (six) hours as needed for mild pain (or Fever >/= 101). 30 tablet 0  . aspirin EC 325 MG EC tablet Take 1 tablet (325 mg total) by mouth 2 (two) times daily. 60 tablet 0  . baclofen (LIORESAL) 10 MG tablet Take 10 mg by mouth 3 (three) times daily as needed for muscle spasms.    . bisacodyl  (DULCOLAX) 5 MG EC tablet Take 1 tablet (5 mg total) by mouth daily as needed for moderate constipation. 30 tablet 0  . cholecalciferol (VITAMIN D) 1000 UNITS tablet Take 1,000 Units by mouth every evening.    Marland Kitchen efavirenz-emtricitabine-tenofovir (ATRIPLA) 600-200-300 MG per tablet Take 1 tablet by mouth at bedtime. 30 tablet 8  . enalapril (VASOTEC) 20 MG tablet Take 1 tablet (20 mg total) by mouth daily. 90 tablet 3  . feeding supplement, ENSURE COMPLETE, (ENSURE COMPLETE) LIQD Take 237 mLs by mouth 2 (two) times daily between meals. 474 mL 11  . ferrous sulfate 325 (65 FE) MG tablet Take 1 tablet (325 mg total) by mouth 2 (two) times daily with a meal. 60 tablet 0  . furosemide (LASIX) 20 MG tablet Take 1 tablet (20 mg total) by mouth daily. 30 tablet 11  . naproxen (NAPROSYN) 500 MG tablet   1  . ondansetron (ZOFRAN) 4 MG tablet Take 1 tablet (4 mg total) by mouth every 6 (six) hours as needed for nausea. 20 tablet 0  . potassium chloride (K-DUR) 10 MEQ tablet Take 1 tablet (10 mEq total) by mouth daily. 90 tablet 3  . tamsulosin (FLOMAX) 0.4 MG CAPS take 1 capsule by mouth twice a day 60 capsule 0  . traMADol (ULTRAM) 50 MG tablet TAKE 1 TABLET BY MOUTH  EVERY 6 HOURS AS NEEDED 120 tablet 5  . vitamin A 10000 UNIT capsule Take 10,000 Units by mouth every evening.    . vitamin C (ASCORBIC ACID) 500 MG tablet Take 500 mg by mouth every evening.    Marland Kitchen HYDROcodone-acetaminophen (NORCO) 10-325 MG per tablet take 1/2 to 1 tablet by mouth every 8 hours if needed  0   No current facility-administered medications on file prior to visit.   Review of Systems  Constitutional: Negative for unusual diaphoresis or night sweats HENT: Negative for ringing in ear or discharge Eyes: Negative for double vision or worsening visual disturbance.  Respiratory: Negative for choking and stridor.   Gastrointestinal: Negative for vomiting or other signifcant bowel change Genitourinary: Negative for hematuria or  change in urine volume.  Musculoskeletal: Negative for other MSK pain or swelling Skin: Negative for color change and worsening wound.  Neurological: Negative for tremors and numbness other than noted  Psychiatric/Behavioral: Negative for decreased concentration or agitation other than above       Objective:   Physical Exam BP 96/58 mmHg  Pulse 95  Temp(Src) 98.2 F (36.8 C) (Oral)  Ht 5\' 11"  (1.803 m)  Wt 157 lb (71.215 kg)  BMI 21.91 kg/m2  SpO2 95% VS noted,  Constitutional: Pt is oriented to person, place, and time. Appears well-developed and well-nourished, in no significant distress Head: Normocephalic and atraumatic.  Right Ear: External ear normal.  Left Ear: External ear normal.  Nose: Nose normal.  Mouth/Throat: Oropharynx is clear  and moist.  Eyes: Conjunctivae and EOM are normal. Pupils are equal, round, and reactive to light.  Neck: Normal range of motion. Neck supple. No JVD present. No tracheal deviation present or significant neck LA or mass Cardiovascular: Normal rate, regular rhythm, normal heart sounds and intact distal pulses.   Pulmonary/Chest: Effort normal and breath sounds without rales or wheezing  Abdominal: Soft. Bowel sounds are normal. NT. No HSM  Musculoskeletal: Normal range of motion. Exhibits no edema.  Lymphadenopathy:  Has no cervical adenopathy.  Neurological: Pt is alert and oriented to person, place, and time. Pt has normal reflexes. No cranial nerve deficit. Motor grossly intact Skin: Skin is warm and dry. No rash noted.  Psychiatric:  Has normal mood and affect. Behavior is normal.      Assessment & Plan:

## 2015-02-22 NOTE — Assessment & Plan Note (Signed)
Ok for pain referral

## 2015-02-22 NOTE — Assessment & Plan Note (Signed)

## 2015-02-22 NOTE — Progress Notes (Signed)
Pre visit review using our clinic review tool, if applicable. No additional management support is needed unless otherwise documented below in the visit note. 

## 2015-02-23 ENCOUNTER — Telehealth: Payer: Self-pay | Admitting: Internal Medicine

## 2015-02-23 NOTE — Telephone Encounter (Addendum)
Ok to forward this to [facility]

## 2015-02-23 NOTE — Telephone Encounter (Signed)
Pt called in and would like for Dr Jenny Reichmann to refer him to the pain clinic   DR Enders  [place] [phone]

## 2015-03-02 ENCOUNTER — Encounter: Payer: Self-pay | Admitting: Internal Medicine

## 2015-03-09 ENCOUNTER — Telehealth: Payer: Self-pay | Admitting: Internal Medicine

## 2015-03-09 MED ORDER — HYDROCODONE-ACETAMINOPHEN 10-325 MG PO TABS: ORAL_TABLET | ORAL | Status: DC
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
## 2015-03-09 NOTE — Telephone Encounter (Signed)
Please advise in PCP's absence, thanks! 
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
## 2015-03-10 DIAGNOSIS — B20 Human immunodeficiency virus [HIV] disease: Secondary | ICD-10-CM | POA: Diagnosis not present

## 2015-03-13 ENCOUNTER — Telehealth: Payer: Self-pay | Admitting: *Deleted
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
## 2015-03-29 DIAGNOSIS — C61 Malignant neoplasm of prostate: Secondary | ICD-10-CM | POA: Diagnosis not present

## 2015-03-29 DIAGNOSIS — N320 Bladder-neck obstruction: Secondary | ICD-10-CM | POA: Diagnosis not present

## 2015-03-30 ENCOUNTER — Telehealth: Payer: Self-pay | Admitting: Internal Medicine

## 2015-03-30 ENCOUNTER — Other Ambulatory Visit: Payer: Self-pay | Admitting: Urology

## 2015-03-30 DIAGNOSIS — C61 Malignant neoplasm of prostate: Secondary | ICD-10-CM

## 2015-03-30 NOTE — Telephone Encounter (Signed)
Pt request to speak to the assist concern about [employer] and medications? Please call him back (cant really make out the detail massage).

## 2015-03-31 NOTE — Telephone Encounter (Signed)
Pt advised that Insurance paperwork has been forwarded to Medical Records and he should contact that department for status update

## 2015-04-05 DIAGNOSIS — K740 Hepatic fibrosis: Secondary | ICD-10-CM | POA: Diagnosis not present

## 2015-04-05 DIAGNOSIS — B182 Chronic viral hepatitis C: Secondary | ICD-10-CM | POA: Diagnosis not present

## 2015-04-12 ENCOUNTER — Telehealth: Payer: Self-pay | Admitting: *Deleted

## 2015-04-12 MED ORDER — ENSURE COMPLETE PO LIQD: 237.0000 mL | Freq: Two times a day (BID) | ORAL | Status: AC

## 2015-04-12 NOTE — Telephone Encounter (Signed)
Receive call pt is needing rx sent for his ensure to the [place] program w/[place]. Inform pt will fax to [facility] @ [phone].../LMB

## 2015-04-13 ENCOUNTER — Encounter (HOSPITAL_COMMUNITY)
Admission: RE | Admit: 2015-04-13 | Discharge: 2015-04-13 | Disposition: A | Discharge status: Home / Self Care | Payer: Commercial Managed Care - HMO | Source: Ambulatory Visit | Attending: Urology | Admitting: Urology

## 2015-04-13 DIAGNOSIS — C61 Malignant neoplasm of prostate: Secondary | ICD-10-CM | POA: Insufficient documentation

## 2015-04-13 DIAGNOSIS — M545 Low back pain: Secondary | ICD-10-CM | POA: Diagnosis not present

## 2015-04-13 DIAGNOSIS — R599 Enlarged lymph nodes, unspecified: Secondary | ICD-10-CM | POA: Diagnosis not present

## 2015-04-13 MED ORDER — TECHNETIUM TC 99M MEDRONATE IV KIT
27.2000 | PACK | Freq: Once | INTRAVENOUS | Status: AC | PRN
  Administered 2015-04-13: 27.2 via INTRAVENOUS

## 2015-04-19 ENCOUNTER — Telehealth: Payer: Self-pay | Admitting: Internal Medicine

## 2015-04-19 MED ORDER — HYDROCODONE-ACETAMINOPHEN 10-325 MG PO TABS: ORAL_TABLET | ORAL | Status: DC

## 2015-04-19 NOTE — Telephone Encounter (Signed)
Done hardcopy to [name]  

## 2015-04-19 NOTE — Telephone Encounter (Signed)
Pt requesting refill for HYDROcodone-acetaminophen (NORCO) 10-325 MG tablet [id number]

## 2015-04-20 NOTE — Telephone Encounter (Signed)
Pt informed, Rx in cabinet for pt pick up  

## 2015-04-21 DIAGNOSIS — B20 Human immunodeficiency virus [HIV] disease: Secondary | ICD-10-CM | POA: Diagnosis not present

## 2015-04-26 DIAGNOSIS — C61 Malignant neoplasm of prostate: Secondary | ICD-10-CM | POA: Diagnosis not present

## 2015-05-03 ENCOUNTER — Telehealth: Payer: Self-pay

## 2015-05-03 NOTE — Telephone Encounter (Signed)
fup for AWV; appears complex; not sure if seeing treatment at present as last directed by Dr. Jenny Reichmann. LVM for call back to discuss AWV and to schedule if helpful .

## 2015-05-16 ENCOUNTER — Ambulatory Visit (INDEPENDENT_AMBULATORY_CARE_PROVIDER_SITE_OTHER): Payer: Commercial Managed Care - HMO | Admitting: Internal Medicine

## 2015-05-16 ENCOUNTER — Encounter: Payer: Self-pay | Admitting: Internal Medicine

## 2015-05-16 VITALS — BP 90/52 | HR 70 | Temp 97.6°F | Ht 68.0 in | Wt 157.0 lb

## 2015-05-16 DIAGNOSIS — B182 Chronic viral hepatitis C: Secondary | ICD-10-CM

## 2015-05-16 DIAGNOSIS — N189 Chronic kidney disease, unspecified: Secondary | ICD-10-CM

## 2015-05-16 DIAGNOSIS — B20 Human immunodeficiency virus [HIV] disease: Secondary | ICD-10-CM

## 2015-05-16 DIAGNOSIS — C61 Malignant neoplasm of prostate: Secondary | ICD-10-CM

## 2015-05-16 DIAGNOSIS — N182 Chronic kidney disease, stage 2 (mild): Secondary | ICD-10-CM

## 2015-05-16 LAB — BASIC METABOLIC PANEL WITH GFR
BUN: 42 mg/dL — AB (ref 7–25)
CO2: 18 mmol/L — ABNORMAL LOW (ref 20–31)
CREATININE: 2.26 mg/dL — AB (ref 0.70–1.18)
Calcium: 8.4 mg/dL — ABNORMAL LOW (ref 8.6–10.3)
Chloride: 102 mmol/L (ref 98–110)
GFR, EST AFRICAN AMERICAN: 32 mL/min — AB (ref >=60)
GFR, Est Non African American: 28 mL/min — ABNORMAL LOW (ref >=60)
Glucose, Bld: 76 mg/dL (ref 65–99)
Potassium: 3.6 mmol/L (ref 3.5–5.3)
Sodium: 132 mmol/L — ABNORMAL LOW (ref 135–146)

## 2015-05-16 NOTE — Assessment & Plan Note (Signed)
Treated by [facility].  Will try to get records from them.

## 2015-05-16 NOTE — Assessment & Plan Note (Signed)
Going to see his urologist due to recent increased PSAand patient aware.

## 2015-05-16 NOTE — Assessment & Plan Note (Signed)
Will check his creat again

## 2015-05-16 NOTE — Assessment & Plan Note (Signed)
i discussed change to TAF-containing regimen due to increased creat though he is hesitant.  I discussed that we can keep an eye on his creat but if it increased above 2, I will strongly encourage him to change off of tenofovir DF.   RTC 4 months

## 2015-05-16 NOTE — Progress Notes (Signed)
  Subjective:    Patient ID: Billy Brink., male    DOB: 02-Jul-1940, 74 y.o.   MRN: [id number]  HPI For routine followup.      He continues on Atripla and denies any missed doses.  Asymptomatic from his medication. His CD4 count has been around 300 for months but now is 900 last time, about 8 months ago.  Has had hip replacement and feels better.  Noted recently to have elevated PSA and going to see his urologist later this month.  No new issues.  Creat has been up.    No labs prior to this visit.        Review of Systems  Constitutional: Negative for fever, fatigue and unexpected weight change.  HENT: Negative for sore throat and trouble swallowing.   Eyes: Negative for visual disturbance.  Respiratory: Negative for shortness of breath.   Cardiovascular: Negative for chest pain.  Gastrointestinal: Negative for nausea, abdominal pain and diarrhea.  Musculoskeletal: Negative for myalgias and arthralgias.  Skin: Negative for rash.  Neurological: Negative for headaches.  Hematological: Negative for adenopathy.  Psychiatric/Behavioral: The patient is not nervous/anxious.        Objective:   Physical Exam  Constitutional: He appears well-developed and well-nourished. No distress.  HENT:  Mouth/Throat: No oropharyngeal exudate.  Eyes: No scleral icterus.  Cardiovascular: Normal rate, regular rhythm and normal heart sounds.   No murmur heard. Pulmonary/Chest: Effort normal and breath sounds normal. No respiratory distress.  Lymphadenopathy:    He has no cervical adenopathy.  Skin: No rash noted.  Psychiatric: He has a normal mood and affect. His behavior is normal.    Social History   Social History  . Marital Status: Divorced    Spouse Name: N/A  . Number of Children: 3  . Years of Education: 16   Occupational History  . UNEMPLOYED   . Retired     Social worker   Social History Main Topics  . Smoking status: Former Smoker -- 1.00 packs/day for 10 years    Types:  Cigarettes    Quit date: 10/13/1960  . Smokeless tobacco: Never Used  . Alcohol Use: No  . Drug Use: No     Comment: pt. states stopped using drugs 10/12  . Sexual Activity: Not Currently     Comment: pt. given condoms   Other Topics Concern  . Not on file   Social History Narrative   Separated x 7 years   3 daughters    Culture:mother is [place] and father hispanic   Speaks [place], [place], [place] and [place].              Assessment & Plan:

## 2015-05-17 LAB — HIV-1 RNA QUANT-NO REFLEX-BLD
HIV 1 RNA Quant: <20 copies/mL (ref <20)
HIV-1 RNA Quant, Log: <1.3 Log copies/mL (ref <1.30)

## 2015-05-17 LAB — T-HELPER CELL (CD4) - (RCID CLINIC ONLY)
CD4 % Helper T Cell: 37 % (ref 33–55)
CD4 T Cell Abs: 360 /uL — ABNORMAL LOW (ref 400–2700)

## 2015-05-18 ENCOUNTER — Telehealth: Payer: Self-pay | Admitting: *Deleted

## 2015-05-18 ENCOUNTER — Other Ambulatory Visit: Payer: Self-pay | Admitting: Internal Medicine

## 2015-05-18 NOTE — Telephone Encounter (Deleted)
-----   Message from Thayer Headings, MD sent at 05/18/2015  1:42 PM EST ----- Please let him know his kidney test (creat) is worse again.  As I discussed with him, we need to consider a change in his treatment off of Atripla if it is up, which it is.  Have him continue Atripla for now and schedule him to see [facility] soon for a change in treatment.  I don't have anything available until Feb so needs to be [facility] for now.   Thanks

## 2015-05-18 NOTE — Telephone Encounter (Signed)
Patient will see Minh Monday 12/19 at 9:00 to discuss a new regimen. Do you want to keep his other follow up appointment with you 4/13? Thanks. Landis Gandy, RN

## 2015-05-18 NOTE — Telephone Encounter (Signed)
-----   Message from Thayer Headings, MD sent at 05/18/2015  1:42 PM EST ----- Please let him know his kidney test (creat) is worse again.  As I discussed with him, we need to consider a change in his treatment off of Atripla if it is up, which it is.  Have him continue Atripla for now and schedule him to see [facility] soon for a change in treatment.  I don't have anything available until Feb so needs to be [facility] for now.   Thanks

## 2015-05-18 NOTE — Telephone Encounter (Signed)
error 

## 2015-05-22 ENCOUNTER — Ambulatory Visit: Payer: Commercial Managed Care - HMO | Admitting: Pharmacist Clinician (PhC)/ Clinical Pharmacy Specialist

## 2015-05-22 ENCOUNTER — Other Ambulatory Visit: Payer: Commercial Managed Care - HMO

## 2015-05-22 DIAGNOSIS — B182 Chronic viral hepatitis C: Secondary | ICD-10-CM

## 2015-05-22 DIAGNOSIS — B20 Human immunodeficiency virus [HIV] disease: Secondary | ICD-10-CM | POA: Diagnosis not present

## 2015-05-22 LAB — BASIC METABOLIC PANEL
BUN: 37 mg/dL — AB (ref 7–25)
CO2: 20 mmol/L (ref 20–31)
CREATININE: 1.62 mg/dL — AB (ref 0.70–1.18)
Calcium: 9 mg/dL (ref 8.6–10.3)
Chloride: 105 mmol/L (ref 98–110)
Glucose, Bld: 77 mg/dL (ref 65–99)
Potassium: 4.1 mmol/L (ref 3.5–5.3)
Sodium: 135 mmol/L (ref 135–146)

## 2015-05-22 MED ORDER — DOLUTEGRAVIR SODIUM 50 MG PO TABS: 50.0000 mg | ORAL_TABLET | Freq: Every day | ORAL | Status: DC

## 2015-05-22 MED ORDER — EMTRICITABINE-TENOFOVIR AF 200-25 MG PO TABS: 1.0000 | ORAL_TABLET | Freq: Every day | ORAL | Status: DC

## 2015-05-22 NOTE — Progress Notes (Signed)
Patient ID: [name], male   DOB: 12-Mar-1941, 74 y.o.   MRN: [id number] HPI: Heyward Douthit. is a 74 y.o. male who is here for a pharmacy f/u due to his renal impairment.   Allergies: No Known Allergies  Vitals:    Past Medical History: Past Medical History  Diagnosis Date  . Hypertension   . HIV infection ([place])   . Substance abuse   . Heroin withdrawal ([place]) 07/21/2011  . Allergic rhinitis, cause unspecified 07/21/2011  . Cervical spondylosis 07/21/2011  . Lumbar spondylosis 07/21/2011  . Osteoarthritis 07/21/2011  . COPD (chronic obstructive pulmonary disease) ([place]) 07/21/2011  . 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 Hx of radiation therapy 12/17/11 - 02/13/12    prostate  . Prostate cancer ([place]) 09/19/11    gleason 7  . H/O blood clots   . Pneumonia   . GERD (gastroesophageal reflux disease)   . Hepatitis     Hep C  . Rectal bleeding   . Wears dentures     top  . Wears glasses     Social History: Social History   Social History  . Marital Status: Divorced    Spouse Name: N/A  . Number of Children: 3  . Years of Education: 16   Occupational History  . UNEMPLOYED   . Retired     Social worker   Social History Main Topics  . Smoking status: Former Smoker -- 1.00 packs/day for 10 years    Types: Cigarettes    Quit date: 10/13/1960  . Smokeless tobacco: Never Used  . Alcohol Use: No  . Drug Use: No     Comment: pt. states stopped using drugs 10/12  . Sexual Activity: Not Currently     Comment: pt. given condoms   Other Topics Concern  . Not on file   Social History Narrative   Separated x 7 years   3 daughters    Culture:mother is [place] and father hispanic   Speaks [place], [place], [place] and [place].          Previous Regimen:   Current Regimen: Atripla  Labs: HIV 1 RNA QUANT (copies/mL)  Date Value  05/16/2015 <20  09/19/2014 <20  03/11/2014 <20   CD4 T CELL ABS (/uL)  Date Value  05/16/2015 360*  09/19/2014 900  03/11/2014  300*   HEP B S AB (no units)  Date Value  09/25/2010 NEG   HEPATITIS B SURFACE AG (no units)  Date Value  09/25/2010 NEGATIVE    CrCl: Estimated Creatinine Clearance: 28.6 mL/min (by C-G formula based on Cr of 2.26).  Lipids:    Component Value Date/Time   CHOL 244* 02/22/2015 1118   TRIG 154.0* 02/22/2015 1118   HDL 51.60 02/22/2015 1118   CHOLHDL 5 02/22/2015 1118   VLDL 30.8 02/22/2015 1118   LDLCALC 161* 02/22/2015 1118    Assessment: Benny is very well controlled on his ATP. However, his scr has gotten worse. It has increased to 2.26 now. He was referred to [place] to see about changing is ART regimen to some safer on the kidneys. He is super compliance with his meds. Doesn't have any genotype in EPIC but doubt that he has resistance issue since he is well controlled on ATP. I explained the 2 current options for him that could have much less impact on his kidneys. First, RPV/DTG/3TC or DTG/Descovy. He doesn't have a consistent meal schedule so the RPV based would be an issue. Therefore, he much  rather preferred to be on Descovy/DTG. I'm going to get a HLA test for him today to see if we could simplify his regimen in the future to Triumeq. Additionally, we are also repeating his bmet. Of note, he is also has a hx of hep C but he told me that he was treated last yr by the liver clinic upstairs. Going to get a hep C VL to confirm treatment. After reviewing his meds profile, he is also taking OTC naproxen that could really affect his renal function. I told him to stop but use APAP instead but limit dose to 2g per day.    Recommendations:  Dc ATP Start Descovy 1 PO qday Start DTG 89m PO qday Stop naproxen Use APAP 5051mPO QID prn pain Bmet, HLA, Hep C VL See back in 1 month Med calendar  Pham, Minh Quang, [facility] 05/22/2015, 9:41 AM

## 2015-05-23 ENCOUNTER — Other Ambulatory Visit: Payer: Self-pay | Admitting: Pharmacist Clinician (PhC)/ Clinical Pharmacy Specialist

## 2015-05-23 ENCOUNTER — Telehealth: Payer: Self-pay | Admitting: *Deleted

## 2015-05-23 LAB — HEPATITIS C RNA QUANTITATIVE: HCV QUANT: NOT DETECTED [IU]/mL (ref <15)

## 2015-05-23 MED ORDER — HYDROCODONE-ACETAMINOPHEN 10-325 MG PO TABS: ORAL_TABLET | ORAL | Status: DC

## 2015-05-23 NOTE — Telephone Encounter (Signed)
Left msg on triage requesting refill on his pain med "Hydrocodone"...Billy Lopez

## 2015-05-23 NOTE — Telephone Encounter (Signed)
Notified pt rx ready for pick-up.../lmb 

## 2015-05-23 NOTE — Telephone Encounter (Signed)
Done hardcopy to [name]  

## 2015-06-01 LAB — HLA B*5701: HLA-B*5701 w/rflx HLA-B High: NEGATIVE

## 2015-06-07 DIAGNOSIS — C7951 Secondary malignant neoplasm of bone: Secondary | ICD-10-CM | POA: Diagnosis not present

## 2015-06-07 DIAGNOSIS — C61 Malignant neoplasm of prostate: Secondary | ICD-10-CM | POA: Diagnosis not present

## 2015-06-08 DIAGNOSIS — B20 Human immunodeficiency virus [HIV] disease: Secondary | ICD-10-CM | POA: Diagnosis not present

## 2015-06-14 ENCOUNTER — Ambulatory Visit (INDEPENDENT_AMBULATORY_CARE_PROVIDER_SITE_OTHER): Payer: Commercial Managed Care - HMO | Admitting: Internal Medicine

## 2015-06-14 ENCOUNTER — Emergency Department (HOSPITAL_COMMUNITY): Payer: Commercial Managed Care - HMO

## 2015-06-14 ENCOUNTER — Observation Stay (HOSPITAL_COMMUNITY): Payer: Commercial Managed Care - HMO

## 2015-06-14 ENCOUNTER — Encounter: Payer: Self-pay | Admitting: Internal Medicine

## 2015-06-14 ENCOUNTER — Encounter (HOSPITAL_COMMUNITY): Payer: Self-pay | Admitting: Nurse Practitioner

## 2015-06-14 ENCOUNTER — Inpatient Hospital Stay (HOSPITAL_COMMUNITY)
Admission: EM | Admit: 2015-06-14 | Discharge: 2015-06-19 | DRG: 500 | Disposition: A | Discharge status: Home Health | Payer: Commercial Managed Care - HMO | Attending: Internal Medicine | Admitting: Internal Medicine

## 2015-06-14 VITALS — BP 109/72 | HR 93 | Temp 102.1°F | Wt 160.0 lb

## 2015-06-14 DIAGNOSIS — Z7982 Long term (current) use of aspirin: Secondary | ICD-10-CM

## 2015-06-14 DIAGNOSIS — M75102 Unspecified rotator cuff tear or rupture of left shoulder, not specified as traumatic: Secondary | ICD-10-CM | POA: Diagnosis not present

## 2015-06-14 DIAGNOSIS — Z89421 Acquired absence of other right toe(s): Secondary | ICD-10-CM

## 2015-06-14 DIAGNOSIS — M009 Pyogenic arthritis, unspecified: Secondary | ICD-10-CM | POA: Diagnosis not present

## 2015-06-14 DIAGNOSIS — B182 Chronic viral hepatitis C: Secondary | ICD-10-CM | POA: Diagnosis not present

## 2015-06-14 DIAGNOSIS — N182 Chronic kidney disease, stage 2 (mild): Secondary | ICD-10-CM | POA: Diagnosis present

## 2015-06-14 DIAGNOSIS — Z87891 Personal history of nicotine dependence: Secondary | ICD-10-CM

## 2015-06-14 DIAGNOSIS — F1910 Other psychoactive substance abuse, uncomplicated: Secondary | ICD-10-CM | POA: Diagnosis present

## 2015-06-14 DIAGNOSIS — Z96643 Presence of artificial hip joint, bilateral: Secondary | ICD-10-CM | POA: Diagnosis present

## 2015-06-14 DIAGNOSIS — M25512 Pain in left shoulder: Secondary | ICD-10-CM | POA: Diagnosis not present

## 2015-06-14 DIAGNOSIS — Z8249 Family history of ischemic heart disease and other diseases of the circulatory system: Secondary | ICD-10-CM

## 2015-06-14 DIAGNOSIS — M129 Arthropathy, unspecified: Secondary | ICD-10-CM

## 2015-06-14 DIAGNOSIS — J42 Unspecified chronic bronchitis: Secondary | ICD-10-CM

## 2015-06-14 DIAGNOSIS — Z923 Personal history of irradiation: Secondary | ICD-10-CM

## 2015-06-14 DIAGNOSIS — B20 Human immunodeficiency virus [HIV] disease: Secondary | ICD-10-CM

## 2015-06-14 DIAGNOSIS — N189 Chronic kidney disease, unspecified: Secondary | ICD-10-CM

## 2015-06-14 DIAGNOSIS — R509 Fever, unspecified: Secondary | ICD-10-CM | POA: Diagnosis not present

## 2015-06-14 DIAGNOSIS — H9192 Unspecified hearing loss, left ear: Secondary | ICD-10-CM | POA: Diagnosis present

## 2015-06-14 DIAGNOSIS — M1990 Unspecified osteoarthritis, unspecified site: Secondary | ICD-10-CM | POA: Diagnosis present

## 2015-06-14 DIAGNOSIS — Z79899 Other long term (current) drug therapy: Secondary | ICD-10-CM

## 2015-06-14 DIAGNOSIS — I129 Hypertensive chronic kidney disease with stage 1 through stage 4 chronic kidney disease, or unspecified chronic kidney disease: Secondary | ICD-10-CM | POA: Diagnosis present

## 2015-06-14 DIAGNOSIS — J449 Chronic obstructive pulmonary disease, unspecified: Secondary | ICD-10-CM | POA: Diagnosis present

## 2015-06-14 DIAGNOSIS — M1120 Other chondrocalcinosis, unspecified site: Secondary | ICD-10-CM | POA: Insufficient documentation

## 2015-06-14 DIAGNOSIS — I10 Essential (primary) hypertension: Secondary | ICD-10-CM | POA: Diagnosis not present

## 2015-06-14 DIAGNOSIS — M19019 Primary osteoarthritis, unspecified shoulder: Secondary | ICD-10-CM | POA: Insufficient documentation

## 2015-06-14 DIAGNOSIS — Z8546 Personal history of malignant neoplasm of prostate: Secondary | ICD-10-CM

## 2015-06-14 DIAGNOSIS — K219 Gastro-esophageal reflux disease without esophagitis: Secondary | ICD-10-CM | POA: Diagnosis present

## 2015-06-14 DIAGNOSIS — Z993 Dependence on wheelchair: Secondary | ICD-10-CM

## 2015-06-14 HISTORY — DX: Pyogenic arthritis, unspecified: M00.9

## 2015-06-14 LAB — URINALYSIS, ROUTINE W REFLEX MICROSCOPIC
Bilirubin Urine: NEGATIVE
GLUCOSE, UA: NEGATIVE mg/dL
HGB URINE DIPSTICK: NEGATIVE
Ketones, ur: NEGATIVE mg/dL
Leukocytes, UA: NEGATIVE
Nitrite: NEGATIVE
PH: 5.5 (ref 5.0–8.0)
PROTEIN: NEGATIVE mg/dL
SPECIFIC GRAVITY, URINE: 1.007 (ref 1.005–1.030)

## 2015-06-14 LAB — BASIC METABOLIC PANEL
Anion gap: 9 (ref 5–15)
BUN: 22 mg/dL — AB (ref 6–20)
CALCIUM: 9.2 mg/dL (ref 8.9–10.3)
CO2: 21 mmol/L — ABNORMAL LOW (ref 22–32)
Chloride: 104 mmol/L (ref 101–111)
Creatinine, Ser: 1.33 mg/dL — ABNORMAL HIGH (ref 0.61–1.24)
GFR calc Af Amer: >60 mL/min (ref >60)
GFR, EST NON AFRICAN AMERICAN: 52 mL/min — AB (ref >60)
GLUCOSE: 141 mg/dL — AB (ref 65–99)
POTASSIUM: 3.5 mmol/L (ref 3.5–5.1)
Sodium: 134 mmol/L — ABNORMAL LOW (ref 135–145)

## 2015-06-14 LAB — COMPLETE METABOLIC PANEL WITH GFR
ALK PHOS: 804 U/L — AB (ref 40–115)
ALT: 11 U/L (ref 9–46)
AST: 22 U/L (ref 10–35)
Albumin: 3.7 g/dL (ref 3.6–5.1)
BUN: 26 mg/dL — ABNORMAL HIGH (ref 7–25)
CALCIUM: 8.9 mg/dL (ref 8.6–10.3)
CHLORIDE: 103 mmol/L (ref 98–110)
CO2: 21 mmol/L (ref 20–31)
Creat: 1.24 mg/dL — ABNORMAL HIGH (ref 0.70–1.18)
GFR, EST AFRICAN AMERICAN: 67 mL/min (ref >=60)
GFR, Est Non African American: 58 mL/min — ABNORMAL LOW (ref >=60)
Glucose, Bld: 129 mg/dL — ABNORMAL HIGH (ref 65–99)
POTASSIUM: 3.8 mmol/L (ref 3.5–5.3)
Sodium: 134 mmol/L — ABNORMAL LOW (ref 135–146)
Total Bilirubin: 0.4 mg/dL (ref 0.2–1.2)
Total Protein: 6.6 g/dL (ref 6.1–8.1)

## 2015-06-14 LAB — CBC WITH DIFFERENTIAL/PLATELET
BASOS ABS: 0 10*3/uL (ref 0.0–0.1)
Basophils Relative: 1 %
EOS PCT: 0 %
Eosinophils Absolute: 0 10*3/uL (ref 0.0–0.7)
HCT: 36.8 % — ABNORMAL LOW (ref 39.0–52.0)
Hemoglobin: 12.1 g/dL — ABNORMAL LOW (ref 13.0–17.0)
LYMPHS PCT: 14 %
Lymphs Abs: 1.1 10*3/uL (ref 0.7–4.0)
MCH: 29.2 pg (ref 26.0–34.0)
MCHC: 32.9 g/dL (ref 30.0–36.0)
MCV: 88.9 fL (ref 78.0–100.0)
MONO ABS: 0.9 10*3/uL (ref 0.1–1.0)
Monocytes Relative: 11 %
Neutro Abs: 5.9 10*3/uL (ref 1.7–7.7)
Neutrophils Relative %: 74 %
PLATELETS: 201 10*3/uL (ref 150–400)
RBC: 4.14 MIL/uL — ABNORMAL LOW (ref 4.22–5.81)
RDW: 13.4 % (ref 11.5–15.5)
WBC: 7.9 10*3/uL (ref 4.0–10.5)

## 2015-06-14 LAB — GLUCOSE, CAPILLARY: GLUCOSE-CAPILLARY: 155 mg/dL — AB (ref 65–99)

## 2015-06-14 LAB — C-REACTIVE PROTEIN: CRP: 2 mg/dL — ABNORMAL HIGH (ref <1.0)

## 2015-06-14 LAB — SEDIMENTATION RATE: Sed Rate: 35 mm/hr — ABNORMAL HIGH (ref 0–16)

## 2015-06-14 MED ORDER — ONDANSETRON HCL 4 MG/2ML IJ SOLN
4.0000 mg | Freq: Once | INTRAMUSCULAR | Status: AC
  Administered 2015-06-14: 4 mg via INTRAVENOUS
  Filled 2015-06-14: qty 2

## 2015-06-14 MED ORDER — MAGNESIUM CITRATE PO SOLN: 1.0000 | Freq: Once | ORAL | Status: DC | PRN

## 2015-06-14 MED ORDER — INSULIN ASPART 100 UNIT/ML ~~LOC~~ SOLN
0.0000 [IU] | Freq: Three times a day (TID) | SUBCUTANEOUS | Status: DC
  Administered 2015-06-16 (×2): 1 [IU] via SUBCUTANEOUS
  Administered 2015-06-18: 2 [IU] via SUBCUTANEOUS
  Administered 2015-06-18: 1 [IU] via SUBCUTANEOUS

## 2015-06-14 MED ORDER — DEXTROSE 5 % IV SOLN
2.0000 g | Freq: Two times a day (BID) | INTRAVENOUS | Status: DC
  Administered 2015-06-14 (×2): 2 g via INTRAVENOUS
  Filled 2015-06-14 (×2): qty 2

## 2015-06-14 MED ORDER — ACETAMINOPHEN 325 MG PO TABS
650.0000 mg | ORAL_TABLET | Freq: Four times a day (QID) | ORAL | Status: DC | PRN
  Administered 2015-06-14 – 2015-06-15 (×2): 650 mg via ORAL
  Filled 2015-06-14 (×2): qty 2

## 2015-06-14 MED ORDER — TRAMADOL HCL 50 MG PO TABS: 50.0000 mg | ORAL_TABLET | ORAL | Status: DC | PRN

## 2015-06-14 MED ORDER — VITAMIN D 1000 UNITS PO TABS
1000.0000 [IU] | ORAL_TABLET | Freq: Every evening | ORAL | Status: DC
  Administered 2015-06-15 – 2015-06-18 (×4): 1000 [IU] via ORAL
  Filled 2015-06-14 (×4): qty 1

## 2015-06-14 MED ORDER — VANCOMYCIN HCL IN DEXTROSE 750-5 MG/150ML-% IV SOLN
750.0000 mg | Freq: Two times a day (BID) | INTRAVENOUS | Status: DC
  Filled 2015-06-14: qty 150

## 2015-06-14 MED ORDER — FUROSEMIDE 20 MG PO TABS: 20.0000 mg | ORAL_TABLET | Freq: Every day | ORAL | Status: DC

## 2015-06-14 MED ORDER — POTASSIUM CHLORIDE ER 10 MEQ PO TBCR
10.0000 meq | EXTENDED_RELEASE_TABLET | Freq: Every day | ORAL | Status: DC
  Administered 2015-06-16: 10 meq via ORAL
  Filled 2015-06-14 (×3): qty 1

## 2015-06-14 MED ORDER — DEXTROSE 5 % IV SOLN
2.0000 g | INTRAVENOUS | Status: DC
  Administered 2015-06-15 – 2015-06-17 (×5): 2 g via INTRAVENOUS
  Filled 2015-06-14 (×6): qty 2

## 2015-06-14 MED ORDER — ONDANSETRON HCL 4 MG PO TABS: 4.0000 mg | ORAL_TABLET | Freq: Four times a day (QID) | ORAL | Status: DC | PRN

## 2015-06-14 MED ORDER — ALUM & MAG HYDROXIDE-SIMETH 200-200-20 MG/5ML PO SUSP: 30.0000 mL | Freq: Four times a day (QID) | ORAL | Status: DC | PRN

## 2015-06-14 MED ORDER — TAMSULOSIN HCL 0.4 MG PO CAPS
0.4000 mg | ORAL_CAPSULE | Freq: Two times a day (BID) | ORAL | Status: DC
  Administered 2015-06-14 – 2015-06-19 (×9): 0.4 mg via ORAL
  Filled 2015-06-14 (×9): qty 1

## 2015-06-14 MED ORDER — INSULIN ASPART 100 UNIT/ML ~~LOC~~ SOLN
0.0000 [IU] | Freq: Every day | SUBCUTANEOUS | Status: DC
Start: 2015-06-14 — End: 2015-06-19

## 2015-06-14 MED ORDER — HYDROCHLOROTHIAZIDE 25 MG PO TABS
25.0000 mg | ORAL_TABLET | Freq: Every day | ORAL | Status: DC
  Administered 2015-06-15 – 2015-06-17 (×3): 25 mg via ORAL
  Filled 2015-06-14 (×3): qty 1

## 2015-06-14 MED ORDER — MORPHINE SULFATE (PF) 2 MG/ML IV SOLN
1.0000 mg | INTRAVENOUS | Status: DC | PRN
Start: 2015-06-14 — End: 2015-06-19
  Administered 2015-06-14 – 2015-06-18 (×12): 1 mg via INTRAVENOUS
  Filled 2015-06-14 (×13): qty 1

## 2015-06-14 MED ORDER — EMTRICITABINE-TENOFOVIR AF 200-25 MG PO TABS: 1.0000 | ORAL_TABLET | Freq: Every day | ORAL | Status: DC

## 2015-06-14 MED ORDER — VANCOMYCIN HCL 10 G IV SOLR
1500.0000 mg | Freq: Once | INTRAVENOUS | Status: AC
  Administered 2015-06-14: 1500 mg via INTRAVENOUS
  Filled 2015-06-14: qty 1500

## 2015-06-14 MED ORDER — ENSURE ENLIVE PO LIQD
237.0000 mL | Freq: Two times a day (BID) | ORAL | Status: DC
  Administered 2015-06-16 – 2015-06-19 (×4): 237 mL via ORAL

## 2015-06-14 MED ORDER — TRAMADOL HCL 50 MG PO TABS
50.0000 mg | ORAL_TABLET | ORAL | Status: DC
  Administered 2015-06-14: 50 mg via ORAL
  Filled 2015-06-14: qty 1

## 2015-06-14 MED ORDER — HYDROCODONE-ACETAMINOPHEN 5-325 MG PO TABS
0.5000 | ORAL_TABLET | Freq: Four times a day (QID) | ORAL | Status: DC
  Administered 2015-06-14 – 2015-06-19 (×18): 0.5 via ORAL
  Filled 2015-06-14 (×18): qty 1

## 2015-06-14 MED ORDER — MORPHINE SULFATE (PF) 4 MG/ML IV SOLN
4.0000 mg | Freq: Once | INTRAVENOUS | Status: AC
Start: 2015-06-14 — End: 2015-06-14
  Administered 2015-06-14: 4 mg via INTRAVENOUS
  Filled 2015-06-14: qty 1

## 2015-06-14 MED ORDER — POLYETHYLENE GLYCOL 3350 17 G PO PACK: 17.0000 g | PACK | Freq: Every day | ORAL | Status: DC | PRN

## 2015-06-14 MED ORDER — VITAMIN A 10000 UNITS PO CAPS: 10000.0000 [IU] | ORAL_CAPSULE | Freq: Every evening | ORAL | Status: DC

## 2015-06-14 MED ORDER — ENALAPRIL MALEATE 20 MG PO TABS
20.0000 mg | ORAL_TABLET | Freq: Every day | ORAL | Status: DC
Start: 2015-06-15 — End: 2015-06-19
  Administered 2015-06-16 – 2015-06-19 (×4): 20 mg via ORAL
  Filled 2015-06-14 (×5): qty 1

## 2015-06-14 MED ORDER — EMTRICITABINE-TENOFOVIR AF 200-25 MG PO TABS
1.0000 | ORAL_TABLET | Freq: Every day | ORAL | Status: DC
  Administered 2015-06-14 – 2015-06-18 (×5): 1 via ORAL
  Filled 2015-06-14 (×6): qty 1

## 2015-06-14 MED ORDER — BISACODYL 5 MG PO TBEC: 5.0000 mg | DELAYED_RELEASE_TABLET | Freq: Every day | ORAL | Status: DC | PRN

## 2015-06-14 MED ORDER — ENOXAPARIN SODIUM 30 MG/0.3ML ~~LOC~~ SOLN
30.0000 mg | SUBCUTANEOUS | Status: DC
  Administered 2015-06-14 – 2015-06-15 (×2): 30 mg via SUBCUTANEOUS
  Filled 2015-06-14 (×2): qty 0.3

## 2015-06-14 MED ORDER — ONDANSETRON HCL 4 MG/2ML IJ SOLN: 4.0000 mg | Freq: Four times a day (QID) | INTRAMUSCULAR | Status: DC | PRN

## 2015-06-14 MED ORDER — VITAMIN C 500 MG PO TABS
500.0000 mg | ORAL_TABLET | Freq: Every evening | ORAL | Status: DC
  Administered 2015-06-15 – 2015-06-18 (×4): 500 mg via ORAL
  Filled 2015-06-14 (×4): qty 1

## 2015-06-14 MED ORDER — TRAZODONE HCL 50 MG PO TABS
25.0000 mg | ORAL_TABLET | Freq: Every evening | ORAL | Status: DC | PRN
  Administered 2015-06-15: 25 mg via ORAL
  Filled 2015-06-14 (×2): qty 1

## 2015-06-14 MED ORDER — DOLUTEGRAVIR SODIUM 50 MG PO TABS
50.0000 mg | ORAL_TABLET | Freq: Every day | ORAL | Status: DC
  Administered 2015-06-15 – 2015-06-18 (×4): 50 mg via ORAL
  Filled 2015-06-14 (×4): qty 1

## 2015-06-14 MED ORDER — ASPIRIN EC 325 MG PO TBEC
325.0000 mg | DELAYED_RELEASE_TABLET | Freq: Two times a day (BID) | ORAL | Status: DC
  Administered 2015-06-14 – 2015-06-19 (×8): 325 mg via ORAL
  Filled 2015-06-14 (×10): qty 1

## 2015-06-14 NOTE — Progress Notes (Signed)
   Subjective:    Patient ID: Billy Brink., male    DOB: [birth year], 75 y.o.   MRN: [id number]  HPI Here for follow up of HIV and renal insufficiency.  He also has complaints of acute left shoulder pain and swelling since yesterday.  For his shoulder, this is new.  No recent trauma, no inciting event.  He also is noted to have a fever to 102 here now.  He reports being in a lot of pain, also takes chronic pain medication from his PCP.  Was unable to sleep, unable to lift his shoulder up.    His creat had been stable but last visit noted an increase and had him return, changed to [place] with Descovy and now has been on this about 1 month.  No issues with medication.     Review of Systems  Constitutional: Positive for activity change. Negative for fatigue.  Musculoskeletal: Positive for arthralgias.  Skin: Negative for rash.  Neurological: Negative for dizziness and headaches.       Objective:   Physical Exam  Constitutional: He appears well-developed and well-nourished. No distress.  Eyes: No scleral icterus.  Cardiovascular: Normal rate, regular rhythm and normal heart sounds.   No murmur heard. Pulmonary/Chest: Effort normal and breath sounds normal. No respiratory distress.  Musculoskeletal:  Left shoulder warm, though comparable to right shoulder, some swelling, unable to adduct shoulder due to pain.  No tenderness.    Skin: No rash noted.   Social History   Social History  . Marital Status: Divorced    Spouse Name: N/A  . Number of Children: 3  . Years of Education: 16   Occupational History  . UNEMPLOYED   . Retired     Social worker   Social History Main Topics  . Smoking status: Former Smoker -- 1.00 packs/day for 10 years    Types: Cigarettes    Quit date: 10/13/1960  . Smokeless tobacco: Never Used  . Alcohol Use: No  . Drug Use: No     Comment: pt. states stopped using drugs 10/12  . Sexual Activity: Not Currently     Comment: pt. given condoms    Other Topics Concern  . Not on file   Social History Narrative   Separated x 7 years   3 daughters    Culture:mother is [place] and father hispanic   Speaks [place], [place], [place] and [place].               Assessment & Plan:

## 2015-06-14 NOTE — Addendum Note (Signed)
Addended by: Dolan Amen D on: 06/14/2015 05:04 PM   Modules accepted: Orders

## 2015-06-14 NOTE — Assessment & Plan Note (Signed)
I don't know of the etiology.  It does appear warm and he has a fever so am concerned with septic arthritis.  I offered to scan it and get him asap to ortho but he prefers to go to the ED due to significant pain.

## 2015-06-14 NOTE — H&P (Signed)
[facility] History and Physical  Billy Lopez. NN:[id number] DOB: Mar 17, 1941 DOA: 06/14/2015  Referring physician: sam ED PA PCP: Billy Cower, MD   Chief Complaint: left shoulder pain  HPI: Billy Lopez. is a 75 y.o. male past medical hx HIV, Hep C former substance as the emergency department from infectious disease clinic with the chief complaint of fever greater than 102 and persistent pain in left shoulder. Initial evaluation concerning for osteomyelitis/septic joint.  Patient reports that yesterday he developed sudden acute worsening left shoulder pain. Associated symptoms include swelling heat decreased range of motion to that joint. He does report suffering from chronic bilateral shoulder arthritis. In addition he complains mild generalized weakness mild headache no nausea no vomiting no chest pain palpitation shortness of breath cough. Did experience some chills reports subjective fever. He went to  scheduled ID appointment temperature of 102 discovered. He was referred to the emergency department for further evaluation. Denies chest pain palpitation lightheadedness dizziness take appear near-syncope. Denies dysuria hematuria frequency or urgency. Denies diarrhea constipation bright red blood per rectum or melena  Emergency department max temp 100.6 he is hemodynamically stable and not hypoxic. He is provided with vancomycin and cefepime Zofran and morphine. Blood cultures are drawn  Review of Systems:  10 point review of systems complete and all systems negative except as indicated in the history of present illness  Past Medical History  Diagnosis Date  . Hypertension   . HIV infection ([facility])   . Substance abuse   . Heroin withdrawal (Billy Lopez) 07/21/2011  . Allergic rhinitis, cause unspecified 07/21/2011  . Cervical spondylosis 07/21/2011  . Lumbar spondylosis 07/21/2011  . Osteoarthritis 07/21/2011  . COPD (chronic obstructive pulmonary disease) (Billy [facility]) 07/21/2011  . Hearing  loss on left 07/26/2011  . Hx of radiation therapy 12/17/11 - 02/13/12    prostate  . Prostate cancer (Billy Lopez) 09/19/11    gleason 7  . H/O blood clots   . Pneumonia   . GERD (gastroesophageal reflux disease)   . Hepatitis     Hep C  . Rectal bleeding   . Wears dentures     top  . Wears glasses    Past Surgical History  Procedure Laterality Date  . Hip surgery      total bilateral replacement  . Hernia repair    . Hand surgery      right thumb  . Foot surgery      bilat. ingrown toenails removed  . Colonoscopy N/A 11/17/2012    Procedure: COLONOSCOPY;  Surgeon: Ladene Artist, MD;  Location: [facility];  Service: [facility];  Laterality: N/A;  possible APC  . Amputation Right 03/31/2014    Procedure: RIGHT SECOND TOE AMPUTATION ;  Surgeon: Wylene Simmer, MD;  Location: [facility];  Service: Orthopedics;  Laterality: Right;  . Total hip revision Left 10/08/2014    Procedure: POSTERIOR LEFT TOTAL HIP REVISION;  Surgeon: Paralee Cancel, MD;  Location: [facility];  Service: Orthopedics;  Laterality: Left;   Social History:  reports that he quit smoking about 54 years ago. His smoking use included Cigarettes. He has a 10 pack-year smoking history. He has never used smokeless tobacco. He reports that he does not drink alcohol or use illicit drugs. 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 Alcohol abuse Other   .  Arthritis Other   . Hypertension Other   . Colon cancer Neg Hx   . Diabetes Neg Hx   . Cancer Mother     unsure of type     Prior to Admission medications   Medication Sig Start Date End Date Taking? Authorizing Provider  acetaminophen (TYLENOL) 325 MG tablet Take 2 tablets (650 mg total) by mouth every 6 (six) hours as needed for mild pain (or Fever >/= 101). 10/13/14  Yes Billy Lis, MD  aspirin EC 325 MG  EC tablet Take 1 tablet (325 mg total) by mouth 2 (two) times daily. 10/13/14  Yes Billy Lis, MD  bisacodyl (DULCOLAX) 5 MG EC tablet Take 1 tablet (5 mg total) by mouth daily as needed for moderate constipation. 10/13/14  Yes Billy Lis, MD  cholecalciferol (VITAMIN D) 1000 UNITS tablet Take 1,000 Units by mouth every evening.   Yes Historical Provider, MD  dolutegravir (TIVICAY) 50 MG tablet Take 1 tablet (50 mg total) by mouth daily. 05/22/15  Yes Billy Headings, MD  emtricitabine-tenofovir AF (DESCOVY) 200-25 MG tablet Take 1 tablet by mouth at bedtime. 05/22/15  Yes Billy Headings, MD  enalapril (VASOTEC) 20 MG tablet Take 1 tablet (20 mg total) by mouth daily. 11/09/14  Yes Billy Borg, MD  feeding supplement, ENSURE COMPLETE, (ENSURE COMPLETE) LIQD Take 237 mLs by mouth 2 (two) times daily between meals. 04/12/15  Yes Billy Borg, MD  furosemide (LASIX) 20 MG tablet Take 1 tablet (20 mg total) by mouth daily. 11/09/14  Yes Billy Borg, MD  hydrochlorothiazide (HYDRODIURIL) 25 MG tablet Take 25 mg by mouth daily.  03/25/15  Yes Historical Provider, MD  HYDROcodone-acetaminophen (NORCO) 10-325 MG tablet take 1/2 to 1 tablet by mouth every 8 hours if needed Patient taking differently: Take 0.25 tablets by mouth every 6 (six) hours.  05/23/15  Yes Billy Borg, MD  potassium chloride (K-DUR) 10 MEQ tablet Take 1 tablet (10 mEq total) by mouth daily. 11/23/14  Yes Billy Borg, MD  tamsulosin [facility]) 0.4 MG CAPS take 1 capsule by mouth twice a day 10/01/12  Yes Billy Gallo, MD  traMADol (ULTRAM) 50 MG tablet TAKE 1 TABLET BY MOUTH  EVERY 6 HOURS AS NEEDED Patient taking differently: Take 50 mg by mouth every 4 (four) hours.  01/31/15  Yes Billy Borg, MD  vitamin A 10000 UNIT capsule Take 10,000 Units by mouth every evening.   Yes Historical Provider, MD  vitamin C (ASCORBIC ACID) 500 MG tablet Take 500 mg by mouth every evening.   Yes Historical Provider, MD  ondansetron (ZOFRAN) 4 MG  tablet Take 1 tablet (4 mg total) by mouth every 6 (six) hours as needed for nausea. Patient not taking: Reported on 06/14/2015 10/13/14   Billy Lis, MD   Physical Exam: Filed Vitals:   06/14/15 1300 06/14/15 1330 06/14/15 1400 06/14/15 1428  BP: 98/69 101/71 101/71   Pulse: 80 83 84 78  Temp:    100.6 F (38.1 C)  TempSrc:    Oral  Resp: 19 18 16 12   Height:      Weight:      SpO2: 100% 100% 100% 100%    Wt Readings from Last 3 Encounters:  06/14/15 76.204 kg (168 lb)  06/14/15 72.576 kg (160 lb)  05/16/15 71.215 kg (157 lb)    General:  Appears calm and comfortable cooperative Eyes: PERRL, normal lids, irises & conjunctiva ENT: grossly normal  hearing, weakness membranes of his mouth are moist and pink Neck: no LAD, masses or thyromegaly Cardiovascular: RRR, no m/r/g. No LE edema.  Respiratory: CTA bilaterally, no w/r/r. Normal respiratory effort. Abdomen: soft, ntnd is bowel sounds throughout Skin: no rash or induration seen on limited exam Musculoskeletal: grossly normal tone BUE/BLE, left shoulder with mild edema no erythema some heat exquisite tenderness very diminished range of motion. Other Joints without swelling/erythema Psychiatric: grossly normal mood and affect, speech fluent and appropriate Neurologic: grossly non-focal. Speech clear facial symmetry           Labs on Admission:  Basic Metabolic Panel:  Recent Labs Lab 06/14/15 1055  NA 134*  K 3.5  CL 104  CO2 21*  GLUCOSE 141*  BUN 22*  CREATININE 1.33*  CALCIUM 9.2   Liver Function Tests: No results for input(s): AST, ALT, ALKPHOS, BILITOT, PROT, ALBUMIN in the last 168 hours. No results for input(s): LIPASE, AMYLASE in the last 168 hours. No results for input(s): AMMONIA in the last 168 hours. CBC:  Recent Labs Lab 06/14/15 1055  WBC 7.9  NEUTROABS 5.9  HGB 12.1*  HCT 36.8*  MCV 88.9  PLT 201   Cardiac Enzymes: No results for input(s): CKTOTAL, CKMB, CKMBINDEX, TROPONINI in the  last 168 hours.  BNP (last 3 results) No results for input(s): BNP in the last 8760 hours.  ProBNP (last 3 results) No results for input(s): PROBNP in the last 8760 hours.  CBG: No results for input(s): GLUCAP in the last 168 hours.  Radiological Exams on Admission: Dg Shoulder Left  06/14/2015  CLINICAL DATA:  Left shoulder pain and decreased range of motion, no known injury, initial encounter EXAM: LEFT SHOULDER - 2+ VIEW COMPARISON:  None. FINDINGS: Significant degenerative changes of the left shoulder joint are noted. The humeral head is high-riding against the acromion suggestive of chronic rotator cuff injury. No acute fracture or dislocation is seen. IMPRESSION: Chronic degenerative changes as described. Electronically Signed   By: Inez Catalina M.D.   On: 06/14/2015 11:30    EKG: Independently reviewed. Sinus rhythm  Assessment/Plan Principal Problem:   Left shoulder pain Active Problems:   Human immunodeficiency virus (HIV) disease (HCC)   Chronic hepatitis C without hepatic coma (HCC)   Essential hypertension   Substance abuse   Osteoarthritis   COPD (chronic obstructive pulmonary disease) (HCC)   Chronic renal insufficiency, stage II (mild)  #1. Persistent left shoulder pain concerning for osteomyelitis/septic joint in patient with history of HIV and hep C. Max temp 102. Urinalysis unremarkable, chest x-ray pending, elevated sedimentation rate, C-reactive protein pending. No leukocytosis -We'll obtain an MRI of the left shoulder -Supportive therapy in the form of analgesia and anti-emetic -Continue IV fluids initiated in the emergency department -obtain chest x-ray  2. HIV. Recent labs CD4 pending. Recent visit with infectious disease. Office visit note indicates stable -Continue medication regimen  #3. Hypertension. Fair control in the emergency department. Home medications include Lasix, hydrochlorothiazide -Continue -Monitor  #4. COPD. Stable at baseline. -  Not hypoxic in the emergency department -Continue home medications  #5. Chronic renal insufficiency. Stage II. Readmitted on admission 1.3. Chart review indicates this is slightly below -Very gentle IV fluids -Monitor urine output -Track creatinine   Code Status: full DVT Prophylaxis: Family Communication: none present Disposition Plan: home when ready  Time spent: 53 minutes  [facility]

## 2015-06-14 NOTE — Assessment & Plan Note (Signed)
I will check his labs today to be sure his viral load remained suppressed. RTC 4 months

## 2015-06-14 NOTE — ED Provider Notes (Signed)
CSN: [id number]     Arrival date & time 06/14/15  1010 History   First MD Initiated Contact with Patient 06/14/15 1050     Chief Complaint  Patient presents with  . Shoulder Pain    HPI  Billy Lopez is an 75 y.o. male with history of HIV, HTN, COPD, hep C, former polysubstance abuse who presents to the ED for evaluation of left shoulder pain. He states he started noticing some swelling of his left shoulder yesterday. States this morning when he woke up his shoulder was extremely painful and he could not move it all due to the pain. He states he has tramadol at home which he took with no relief. He saw Dr. Linus Salmons in infectious disease clinic earlier this morning who was concerned about possible septic joint as pt was febrile to 102.64F in their clinic. Pt states he declined outpatient scan and came to the ED due to the severity of his pain. He denies injury or trauma. Denies radiation of the pain. Denies numbness or tingling. In the ED his temperature is 99.6 and his vital signs are otherwise unremarkable and stable.   Past Medical History  Diagnosis Date  . Hypertension   . HIV infection ([place])   . Substance abuse   . Heroin withdrawal ([place]) 07/21/2011  . Allergic rhinitis, cause unspecified 07/21/2011  . Cervical spondylosis 07/21/2011  . Lumbar spondylosis 07/21/2011  . Osteoarthritis 07/21/2011  . COPD (chronic obstructive pulmonary disease) ([place]) 07/21/2011  . 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 Hx of radiation therapy 12/17/11 - 02/13/12    prostate  . Prostate cancer ([place]) 09/19/11    gleason 7  . H/O blood clots   . Pneumonia   . GERD (gastroesophageal reflux disease)   . Hepatitis     Hep C  . Rectal bleeding   . Wears dentures     top  . Wears glasses    Past Surgical History  Procedure Laterality Date  . Hip surgery      total bilateral replacement  . Hernia repair    . Hand surgery      right thumb  . Foot surgery      bilat. ingrown toenails removed  . Colonoscopy N/A  11/17/2012    Procedure: COLONOSCOPY;  Surgeon: Ladene Artist, MD;  Location: [facility];  Service: Endoscopy;  Laterality: N/A;  possible APC  . Amputation Right 03/31/2014    Procedure: RIGHT SECOND TOE AMPUTATION ;  Surgeon: Wylene Simmer, MD;  Location: [facility];  Service: Orthopedics;  Laterality: Right;  . Total hip revision Left 10/08/2014    Procedure: POSTERIOR LEFT TOTAL HIP REVISION;  Surgeon: Paralee Cancel, MD;  Location: [facility];  Service: Orthopedics;  Laterality: Left;   Family History  Problem Relation Age of Onset  . Alcohol abuse Other   . Arthritis Other   . Hypertension Other   . Colon cancer Neg Hx   . Diabetes Neg Hx   . Cancer Mother     unsure of type   Social History  Substance Use Topics  . Smoking status: Former Smoker -- 1.00 packs/day for 10 years    Types: Cigarettes    Quit date: 10/13/1960  . Smokeless tobacco: Never Used  . Alcohol Use: No    Review of Systems  All other systems reviewed and are negative.     Allergies  Review of patient's allergies indicates no known allergies.  Home Medications   Prior to  Admission medications   Medication Sig Start Date End Date Taking? Authorizing Provider  acetaminophen (TYLENOL) 325 MG tablet Take 2 tablets (650 mg total) by mouth every 6 (six) hours as needed for mild pain (or Fever >/= 101). 10/13/14   Robbie Lis, MD  aspirin EC 325 MG EC tablet Take 1 tablet (325 mg total) by mouth 2 (two) times daily. 10/13/14   Robbie Lis, MD  bisacodyl (DULCOLAX) 5 MG EC tablet Take 1 tablet (5 mg total) by mouth daily as needed for moderate constipation. 10/13/14   Robbie Lis, MD  cholecalciferol (VITAMIN D) 1000 UNITS tablet Take 1,000 Units by mouth every evening.    Historical Provider, MD  dolutegravir (TIVICAY) 50 MG tablet Take 1 tablet (50 mg total) by mouth daily. 05/22/15   Thayer Headings, MD  emtricitabine-tenofovir AF (DESCOVY) 200-25 MG tablet Take 1 tablet by mouth at bedtime.  05/22/15   Thayer Headings, MD  enalapril (VASOTEC) 20 MG tablet Take 1 tablet (20 mg total) by mouth daily. 11/09/14   Biagio Borg, MD  feeding supplement, ENSURE COMPLETE, (ENSURE COMPLETE) LIQD Take 237 mLs by mouth 2 (two) times daily between meals. 04/12/15   Biagio Borg, MD  furosemide (LASIX) 20 MG tablet Take 1 tablet (20 mg total) by mouth daily. 11/09/14   Biagio Borg, MD  hydrochlorothiazide (HYDRODIURIL) 25 MG tablet  03/25/15   Historical Provider, MD  HYDROcodone-acetaminophen [facility]) 10-325 MG tablet take 1/2 to 1 tablet by mouth every 8 hours if needed Patient not taking: Reported on 06/14/2015 05/23/15   Biagio Borg, MD  ondansetron (ZOFRAN) 4 MG tablet Take 1 tablet (4 mg total) by mouth every 6 (six) hours as needed for nausea. Patient not taking: Reported on 06/14/2015 10/13/14   Robbie Lis, MD  potassium chloride (K-DUR) 10 MEQ tablet Take 1 tablet (10 mEq total) by mouth daily. 11/23/14   Biagio Borg, MD  tamsulosin [facility]) 0.4 MG CAPS take 1 capsule by mouth twice a day 10/01/12   Franchot Gallo, MD  traMADol (ULTRAM) 50 MG tablet TAKE 1 TABLET BY MOUTH  EVERY 6 HOURS AS NEEDED 01/31/15   Biagio Borg, MD  vitamin A 10000 UNIT capsule Take 10,000 Units by mouth every evening.    Historical Provider, MD  vitamin C (ASCORBIC ACID) 500 MG tablet Take 500 mg by mouth every evening.    Historical Provider, MD   BP 102/70 mmHg  Pulse 88  Temp(Src) 99.6 F (37.6 C) (Oral)  Resp 20  Ht _0  (1.727 m)  Wt 76.204 kg  BMI 25.55 kg/m2  SpO2 92% Physical Exam  Constitutional: He is oriented to person, place, and time.  HENT:  Right Ear: External ear normal.  Left Ear: External ear normal.  Nose: Nose normal.  Mouth/Throat: Oropharynx is clear and moist. No oropharyngeal exudate.  Eyes: Conjunctivae and EOM are normal. Pupils are equal, round, and reactive to light.  Neck: Normal range of motion. Neck supple.  Cardiovascular: Normal rate, regular rhythm, normal heart sounds  and intact distal pulses.   Pulmonary/Chest: Effort normal and breath sounds normal. No respiratory distress.  Abdominal: Soft. Bowel sounds are normal. He exhibits no distension. There is no tenderness.  Musculoskeletal:  Left shoulder mildly edematous compared to right. No erythema. Warm to touch but not hot. Extremely tender to palpation diffusely. Guarding and limited ROM due to pain. No gross deformity.  Neurological: He is alert and oriented  to person, place, and time. No cranial nerve deficit.  Skin: Skin is warm and dry.  Psychiatric: He has a normal mood and affect.  Nursing note and vitals reviewed.    ED Course  Procedures (including critical care time) Labs Review Labs Reviewed  BASIC METABOLIC PANEL - Abnormal; Notable for the following:    Sodium 134 (*)    CO2 21 (*)    Glucose, Bld 141 (*)    BUN 22 (*)    Creatinine, Ser 1.33 (*)    GFR calc non Af Amer 52 (*)    All other components within normal limits  CBC WITH DIFFERENTIAL/PLATELET - Abnormal; Notable for the following:    RBC 4.14 (*)    Hemoglobin 12.1 (*)    HCT 36.8 (*)    All other components within normal limits  SEDIMENTATION RATE - Abnormal; Notable for the following:    Sed Rate 35 (*)    All other components within normal limits  URINE CULTURE  CULTURE, BLOOD (ROUTINE X 2)  CULTURE, BLOOD (ROUTINE X 2)  URINALYSIS, ROUTINE W REFLEX MICROSCOPIC (NOT AT [facility])  C-REACTIVE PROTEIN    Imaging Review Dg Shoulder Left  06/14/2015  CLINICAL DATA:  Left shoulder pain and decreased range of motion, no known injury, initial encounter EXAM: LEFT SHOULDER - 2+ VIEW COMPARISON:  None. FINDINGS: Significant degenerative changes of the left shoulder joint are noted. The humeral head is high-riding against the acromion suggestive of chronic rotator cuff injury. No acute fracture or dislocation is seen. IMPRESSION: Chronic degenerative changes as described. Electronically Signed   By: Inez Catalina M.D.   On:  06/14/2015 11:30   I have personally reviewed and evaluated these images and lab results as part of my medical decision-making.   EKG Interpretation None      MDM   Final diagnoses:  Septic joint ([place])    Given HIV status, fever at ID clinic, ESR elevation, concern for possible septic joint / osteo. Pain improved with morphine. Will start on vanc and cefipime. Will call hospitalist for admission. Have not obtained MRI or CT at this time.   Spoke to NP [facility] for admission to hospitalist service. Will order shoulder MRI. Will admit to medicine.   Anne Ng, PA-C 06/14/15 1434  Charlesetta Shanks, MD 06/15/15 [phone]

## 2015-06-14 NOTE — Assessment & Plan Note (Signed)
Will check his creat today on the new regimen.

## 2015-06-14 NOTE — ED Notes (Signed)
Pt endorses left shoulder pain and swelling and limited movement in left shoulder that started in last night. Patient denies falls, injury, or sleeping on shoulder through the night. Patient sts has bilateral shoulder arthritis that makes movements painful but still has full ROM at baseline. Swelling noted to left shoulder- warmth equal in bilateral shoulder, Radial pulses 2+.

## 2015-06-14 NOTE — Progress Notes (Signed)
ANTIBIOTIC CONSULT NOTE - INITIAL  Pharmacy Consult for Cefepime and Vancomycin Indication: Osteomyelitis  No Known Allergies  Patient Measurements: Height: 5\' 8"  (172.7 cm) Weight: 168 lb (76.204 kg) IBW/kg (Calculated) : 68.4  Vital Signs: Temp: 99.6 F (37.6 C) (01/11 1029) Temp Source: Oral (01/11 1029) BP: 101/71 mmHg (01/11 1330) Pulse Rate: 83 (01/11 1330) Intake/Output from previous day:   Intake/Output from this shift: Total I/O In: -  Out: 300 [Urine:300]  Labs:  Recent Labs  06/14/15 1055  WBC 7.9  HGB 12.1*  PLT 201  CREATININE 1.33*   Estimated Creatinine Clearance: 48.6 mL/min (by C-G formula based on Cr of 1.33). No results for input(s): VANCOTROUGH, VANCOPEAK, VANCORANDOM, GENTTROUGH, GENTPEAK, GENTRANDOM, TOBRATROUGH, TOBRAPEAK, TOBRARND, AMIKACINPEAK, AMIKACINTROU, AMIKACIN in the last 72 hours.   Microbiology: No results found for this or any previous visit (from the past 720 hour(s)).  Medical History: Past Medical History  Diagnosis Date  . Hypertension   . HIV infection ([place])   . Substance abuse   . Heroin withdrawal ([place]) 07/21/2011  . Allergic rhinitis, cause unspecified 07/21/2011  . Cervical spondylosis 07/21/2011  . Lumbar spondylosis 07/21/2011  . Osteoarthritis 07/21/2011  . COPD (chronic obstructive pulmonary disease) ([place]) 07/21/2011  . 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 Hx of radiation therapy 12/17/11 - 02/13/12    prostate  . Prostate cancer ([place]) 09/19/11    gleason 7  . H/O blood clots   . Pneumonia   . GERD (gastroesophageal reflux disease)   . Hepatitis     Hep C  . Rectal bleeding   . Wears dentures     top  . Wears glasses    Assessment: 75 yo M presents on 1/11 with pain and swelling in left shoulder.  Pharmacy consulted to dose abx for osteo. Tmax is 102.1, WBC wnl. SCr 1.33, CrCl ~8ml/min.  Goal of Therapy:  Vancomycin trough level 15-20 mcg/ml  Resolution of infection  Plan:  Start cefepime 2g IV Q12 Give  vancomycin 1.5g IV x 1, then start vancomycin 750mg  IV Q12 Monitor clinical picture, renal function, VT prn F/U C&S, abx deescalation / LOT  Elenor Quinones, PharmD, BCPS Clinical Pharmacist Pager [phone] 06/14/2015 1:46 PM

## 2015-06-15 ENCOUNTER — Encounter (HOSPITAL_COMMUNITY): Payer: Self-pay | Admitting: Anesthesiology

## 2015-06-15 ENCOUNTER — Observation Stay (HOSPITAL_COMMUNITY): Payer: Commercial Managed Care - HMO | Admitting: Anesthesiology

## 2015-06-15 ENCOUNTER — Encounter (HOSPITAL_COMMUNITY)
Admission: EM | Disposition: A | Discharge status: Home Health | Payer: Self-pay | Source: Home / Self Care | Attending: Internal Medicine

## 2015-06-15 DIAGNOSIS — Z8546 Personal history of malignant neoplasm of prostate: Secondary | ICD-10-CM | POA: Diagnosis not present

## 2015-06-15 DIAGNOSIS — B957 Other staphylococcus as the cause of diseases classified elsewhere: Secondary | ICD-10-CM | POA: Diagnosis not present

## 2015-06-15 DIAGNOSIS — M1180 Other specified crystal arthropathies, unspecified site: Secondary | ICD-10-CM | POA: Diagnosis not present

## 2015-06-15 DIAGNOSIS — N182 Chronic kidney disease, stage 2 (mild): Secondary | ICD-10-CM | POA: Diagnosis not present

## 2015-06-15 DIAGNOSIS — M009 Pyogenic arthritis, unspecified: Secondary | ICD-10-CM

## 2015-06-15 DIAGNOSIS — J42 Unspecified chronic bronchitis: Secondary | ICD-10-CM | POA: Diagnosis not present

## 2015-06-15 DIAGNOSIS — I129 Hypertensive chronic kidney disease with stage 1 through stage 4 chronic kidney disease, or unspecified chronic kidney disease: Secondary | ICD-10-CM | POA: Diagnosis not present

## 2015-06-15 DIAGNOSIS — M11212 Other chondrocalcinosis, left shoulder: Secondary | ICD-10-CM | POA: Diagnosis not present

## 2015-06-15 DIAGNOSIS — I10 Essential (primary) hypertension: Secondary | ICD-10-CM | POA: Diagnosis not present

## 2015-06-15 DIAGNOSIS — Z96643 Presence of artificial hip joint, bilateral: Secondary | ICD-10-CM | POA: Diagnosis present

## 2015-06-15 DIAGNOSIS — K219 Gastro-esophageal reflux disease without esophagitis: Secondary | ICD-10-CM | POA: Diagnosis not present

## 2015-06-15 DIAGNOSIS — Z8249 Family history of ischemic heart disease and other diseases of the circulatory system: Secondary | ICD-10-CM | POA: Diagnosis not present

## 2015-06-15 DIAGNOSIS — Z89421 Acquired absence of other right toe(s): Secondary | ICD-10-CM | POA: Diagnosis not present

## 2015-06-15 DIAGNOSIS — R509 Fever, unspecified: Secondary | ICD-10-CM | POA: Diagnosis present

## 2015-06-15 DIAGNOSIS — B182 Chronic viral hepatitis C: Secondary | ICD-10-CM | POA: Diagnosis not present

## 2015-06-15 DIAGNOSIS — Z923 Personal history of irradiation: Secondary | ICD-10-CM | POA: Diagnosis not present

## 2015-06-15 DIAGNOSIS — N289 Disorder of kidney and ureter, unspecified: Secondary | ICD-10-CM | POA: Diagnosis not present

## 2015-06-15 DIAGNOSIS — T560X1A Toxic effect of lead and its compounds, accidental (unintentional), initial encounter: Secondary | ICD-10-CM | POA: Diagnosis not present

## 2015-06-15 DIAGNOSIS — H9192 Unspecified hearing loss, left ear: Secondary | ICD-10-CM | POA: Diagnosis not present

## 2015-06-15 DIAGNOSIS — M25512 Pain in left shoulder: Secondary | ICD-10-CM | POA: Diagnosis not present

## 2015-06-15 DIAGNOSIS — Z993 Dependence on wheelchair: Secondary | ICD-10-CM | POA: Diagnosis not present

## 2015-06-15 DIAGNOSIS — Z7982 Long term (current) use of aspirin: Secondary | ICD-10-CM | POA: Diagnosis not present

## 2015-06-15 DIAGNOSIS — J449 Chronic obstructive pulmonary disease, unspecified: Secondary | ICD-10-CM | POA: Diagnosis not present

## 2015-06-15 DIAGNOSIS — N189 Chronic kidney disease, unspecified: Secondary | ICD-10-CM

## 2015-06-15 DIAGNOSIS — M00012 Staphylococcal arthritis, left shoulder: Secondary | ICD-10-CM | POA: Diagnosis not present

## 2015-06-15 DIAGNOSIS — Z79899 Other long term (current) drug therapy: Secondary | ICD-10-CM | POA: Diagnosis not present

## 2015-06-15 DIAGNOSIS — B20 Human immunodeficiency virus [HIV] disease: Secondary | ICD-10-CM | POA: Diagnosis not present

## 2015-06-15 DIAGNOSIS — M75102 Unspecified rotator cuff tear or rupture of left shoulder, not specified as traumatic: Secondary | ICD-10-CM | POA: Diagnosis not present

## 2015-06-15 DIAGNOSIS — Z87891 Personal history of nicotine dependence: Secondary | ICD-10-CM | POA: Diagnosis not present

## 2015-06-15 HISTORY — PX: SHOULDER ARTHROSCOPY: SHX128

## 2015-06-15 HISTORY — DX: Pyogenic arthritis, unspecified: M00.9

## 2015-06-15 LAB — SYNOVIAL CELL COUNT + DIFF, W/ CRYSTALS
Eosinophils-Synovial: 0 % (ref 0–1)
Lymphocytes-Synovial Fld: 1 % (ref 0–20)
Monocyte-Macrophage-Synovial Fluid: 9 % — ABNORMAL LOW (ref 50–90)
NEUTROPHIL, SYNOVIAL: 90 % — AB (ref 0–25)
OTHER CELLS-SYN: 0
WBC, Synovial: 37250 /mm3 — ABNORMAL HIGH (ref 0–200)

## 2015-06-15 LAB — GLUCOSE, CAPILLARY
GLUCOSE-CAPILLARY: 125 mg/dL — AB (ref 65–99)
GLUCOSE-CAPILLARY: 154 mg/dL — AB (ref 65–99)
Glucose-Capillary: 104 mg/dL — ABNORMAL HIGH (ref 65–99)

## 2015-06-15 LAB — CBC
HEMATOCRIT: 33.8 % — AB (ref 39.0–52.0)
Hemoglobin: 11.3 g/dL — ABNORMAL LOW (ref 13.0–17.0)
MCH: 29.6 pg (ref 26.0–34.0)
MCHC: 33.4 g/dL (ref 30.0–36.0)
MCV: 88.5 fL (ref 78.0–100.0)
Platelets: 184 10*3/uL (ref 150–400)
RBC: 3.82 MIL/uL — AB (ref 4.22–5.81)
RDW: 13.4 % (ref 11.5–15.5)
WBC: 8.1 10*3/uL (ref 4.0–10.5)

## 2015-06-15 LAB — HIV-1 RNA QUANT-NO REFLEX-BLD: HIV 1 RNA Quant: <20 copies/mL (ref <20)

## 2015-06-15 LAB — BASIC METABOLIC PANEL
Anion gap: 8 (ref 5–15)
BUN: 19 mg/dL (ref 6–20)
CHLORIDE: 107 mmol/L (ref 101–111)
CO2: 21 mmol/L — ABNORMAL LOW (ref 22–32)
CREATININE: 1.18 mg/dL (ref 0.61–1.24)
Calcium: 8.6 mg/dL — ABNORMAL LOW (ref 8.9–10.3)
GFR, EST NON AFRICAN AMERICAN: 60 mL/min — AB (ref >60)
Glucose, Bld: 136 mg/dL — ABNORMAL HIGH (ref 65–99)
POTASSIUM: 3.6 mmol/L (ref 3.5–5.1)
SODIUM: 136 mmol/L (ref 135–145)

## 2015-06-15 LAB — T-HELPER CELL (CD4) - (RCID CLINIC ONLY)
CD4 % Helper T Cell: 25 % — ABNORMAL LOW (ref 33–55)
CD4 T CELL ABS: 130 /uL — AB (ref 400–2700)

## 2015-06-15 LAB — GRAM STAIN

## 2015-06-15 SURGERY — ARTHROSCOPY, SHOULDER
Anesthesia: General | Site: Shoulder | Laterality: Left

## 2015-06-15 MED ORDER — LIDOCAINE HCL (CARDIAC) 20 MG/ML IV SOLN
INTRAVENOUS | Status: AC
  Filled 2015-06-15: qty 5

## 2015-06-15 MED ORDER — DOCUSATE SODIUM 100 MG PO CAPS
100.0000 mg | ORAL_CAPSULE | Freq: Two times a day (BID) | ORAL | Status: DC
  Administered 2015-06-15 – 2015-06-19 (×8): 100 mg via ORAL
  Filled 2015-06-15 (×8): qty 1

## 2015-06-15 MED ORDER — METOCLOPRAMIDE HCL 5 MG/ML IJ SOLN: 5.0000 mg | Freq: Three times a day (TID) | INTRAMUSCULAR | Status: DC | PRN

## 2015-06-15 MED ORDER — OXYCODONE HCL 5 MG PO TABS
5.0000 mg | ORAL_TABLET | ORAL | Status: DC | PRN
  Administered 2015-06-15: 10 mg via ORAL
  Filled 2015-06-15: qty 2

## 2015-06-15 MED ORDER — EPHEDRINE SULFATE 50 MG/ML IJ SOLN
INTRAMUSCULAR | Status: AC
  Filled 2015-06-15: qty 1

## 2015-06-15 MED ORDER — FENTANYL CITRATE (PF) 250 MCG/5ML IJ SOLN
INTRAMUSCULAR | Status: AC
  Filled 2015-06-15: qty 5

## 2015-06-15 MED ORDER — ROCURONIUM BROMIDE 50 MG/5ML IV SOLN
INTRAVENOUS | Status: AC
  Filled 2015-06-15: qty 1

## 2015-06-15 MED ORDER — BUPIVACAINE HCL (PF) 0.25 % IJ SOLN
INTRAMUSCULAR | Status: AC
  Filled 2015-06-15: qty 30

## 2015-06-15 MED ORDER — LACTATED RINGERS IV SOLN
INTRAVENOUS | Status: DC | PRN
  Administered 2015-06-15: 13:00:00 via INTRAVENOUS

## 2015-06-15 MED ORDER — LACTATED RINGERS IV SOLN
INTRAVENOUS | Status: DC
  Administered 2015-06-15 – 2015-06-17 (×2): via INTRAVENOUS

## 2015-06-15 MED ORDER — FENTANYL CITRATE (PF) 100 MCG/2ML IJ SOLN
INTRAMUSCULAR | Status: DC | PRN
  Administered 2015-06-15 (×2): 50 ug via INTRAVENOUS
  Administered 2015-06-15: 100 ug via INTRAVENOUS
  Administered 2015-06-15: 50 ug via INTRAVENOUS

## 2015-06-15 MED ORDER — DIPHENHYDRAMINE HCL 12.5 MG/5ML PO ELIX
12.5000 mg | ORAL_SOLUTION | ORAL | Status: DC | PRN
  Filled 2015-06-15: qty 10

## 2015-06-15 MED ORDER — SENNA 8.6 MG PO TABS
1.0000 | ORAL_TABLET | Freq: Two times a day (BID) | ORAL | Status: DC
  Administered 2015-06-15 – 2015-06-19 (×8): 8.6 mg via ORAL
  Filled 2015-06-15 (×8): qty 1

## 2015-06-15 MED ORDER — LIDOCAINE HCL (CARDIAC) 20 MG/ML IV SOLN
INTRAVENOUS | Status: DC | PRN
  Administered 2015-06-15: 80 mg via INTRAVENOUS

## 2015-06-15 MED ORDER — PROPOFOL 10 MG/ML IV BOLUS
INTRAVENOUS | Status: AC
  Filled 2015-06-15: qty 20

## 2015-06-15 MED ORDER — PROMETHAZINE HCL 25 MG/ML IJ SOLN: 6.2500 mg | INTRAMUSCULAR | Status: DC | PRN

## 2015-06-15 MED ORDER — GLYCOPYRROLATE 0.2 MG/ML IJ SOLN
INTRAMUSCULAR | Status: AC
  Filled 2015-06-15: qty 1

## 2015-06-15 MED ORDER — MEPERIDINE HCL 25 MG/ML IJ SOLN: 6.2500 mg | INTRAMUSCULAR | Status: DC | PRN

## 2015-06-15 MED ORDER — PHENYLEPHRINE HCL 10 MG/ML IJ SOLN
10.0000 mg | INTRAVENOUS | Status: DC | PRN
  Administered 2015-06-15: 60 ug/min via INTRAVENOUS

## 2015-06-15 MED ORDER — SODIUM CHLORIDE 0.9 % IV BOLUS (SEPSIS)
500.0000 mL | Freq: Once | INTRAVENOUS | Status: AC
  Administered 2015-06-15: 500 mL via INTRAVENOUS

## 2015-06-15 MED ORDER — NEOSTIGMINE METHYLSULFATE 10 MG/10ML IV SOLN
INTRAVENOUS | Status: DC | PRN
  Administered 2015-06-15: 3 mg via INTRAVENOUS

## 2015-06-15 MED ORDER — NEOSTIGMINE METHYLSULFATE 10 MG/10ML IV SOLN
INTRAVENOUS | Status: AC
  Filled 2015-06-15: qty 1

## 2015-06-15 MED ORDER — MENTHOL 3 MG MT LOZG: 1.0000 | LOZENGE | OROMUCOSAL | Status: DC | PRN

## 2015-06-15 MED ORDER — ROCURONIUM BROMIDE 100 MG/10ML IV SOLN
INTRAVENOUS | Status: DC | PRN
  Administered 2015-06-15: 20 mg via INTRAVENOUS
  Administered 2015-06-15: 8 mg via INTRAVENOUS

## 2015-06-15 MED ORDER — PROPOFOL 10 MG/ML IV BOLUS
INTRAVENOUS | Status: DC | PRN
  Administered 2015-06-15: 60 mg via INTRAVENOUS
  Administered 2015-06-15: 140 mg via INTRAVENOUS

## 2015-06-15 MED ORDER — METOCLOPRAMIDE HCL 10 MG PO TABS: 5.0000 mg | ORAL_TABLET | Freq: Three times a day (TID) | ORAL | Status: DC | PRN

## 2015-06-15 MED ORDER — ONDANSETRON HCL 4 MG/2ML IJ SOLN
INTRAMUSCULAR | Status: DC | PRN
  Administered 2015-06-15: 4 mg via INTRAVENOUS

## 2015-06-15 MED ORDER — SUCCINYLCHOLINE CHLORIDE 20 MG/ML IJ SOLN
INTRAMUSCULAR | Status: DC | PRN
  Administered 2015-06-15: 100 mg via INTRAVENOUS

## 2015-06-15 MED ORDER — SODIUM CHLORIDE 0.9 % IJ SOLN
INTRAMUSCULAR | Status: AC
  Filled 2015-06-15: qty 10

## 2015-06-15 MED ORDER — SUCCINYLCHOLINE CHLORIDE 20 MG/ML IJ SOLN
INTRAMUSCULAR | Status: AC
  Filled 2015-06-15: qty 1

## 2015-06-15 MED ORDER — PHENOL 1.4 % MT LIQD
1.0000 | OROMUCOSAL | Status: DC | PRN
Start: 2015-06-15 — End: 2015-06-19

## 2015-06-15 MED ORDER — VANCOMYCIN HCL IN DEXTROSE 750-5 MG/150ML-% IV SOLN
750.0000 mg | Freq: Two times a day (BID) | INTRAVENOUS | Status: DC
  Administered 2015-06-15 – 2015-06-18 (×6): 750 mg via INTRAVENOUS
  Filled 2015-06-15 (×9): qty 150

## 2015-06-15 MED ORDER — HYDROMORPHONE HCL 1 MG/ML IJ SOLN: 0.5000 mg | INTRAMUSCULAR | Status: DC | PRN

## 2015-06-15 MED ORDER — METHOCARBAMOL 1000 MG/10ML IJ SOLN: 500.0000 mg | Freq: Four times a day (QID) | INTRAVENOUS | Status: DC | PRN

## 2015-06-15 MED ORDER — HYDROMORPHONE HCL 1 MG/ML IJ SOLN
INTRAMUSCULAR | Status: AC
  Administered 2015-06-15: 0.5 mg via INTRAVENOUS
  Filled 2015-06-15: qty 1

## 2015-06-15 MED ORDER — GLYCOPYRROLATE 0.2 MG/ML IJ SOLN
INTRAMUSCULAR | Status: DC | PRN
  Administered 2015-06-15: .2 mg via INTRAVENOUS

## 2015-06-15 MED ORDER — METHOCARBAMOL 500 MG PO TABS
500.0000 mg | ORAL_TABLET | Freq: Four times a day (QID) | ORAL | Status: DC | PRN
  Filled 2015-06-15: qty 1

## 2015-06-15 MED ORDER — ONDANSETRON HCL 4 MG/2ML IJ SOLN
INTRAMUSCULAR | Status: AC
  Filled 2015-06-15: qty 2

## 2015-06-15 MED ORDER — SODIUM CHLORIDE 0.9 % IV SOLN
INTRAVENOUS | Status: DC | PRN
  Administered 2015-06-15: 14:00:00 via INTRAVENOUS

## 2015-06-15 MED ORDER — MORPHINE SULFATE (PF) 4 MG/ML IV SOLN
INTRAVENOUS | Status: AC
  Filled 2015-06-15: qty 1

## 2015-06-15 MED ORDER — HYDROMORPHONE HCL 1 MG/ML IJ SOLN
0.2500 mg | INTRAMUSCULAR | Status: DC | PRN
  Administered 2015-06-15 (×2): 0.5 mg via INTRAVENOUS

## 2015-06-15 MED ORDER — SODIUM CHLORIDE 0.9 % IR SOLN
Status: DC | PRN
  Administered 2015-06-15: 3000 mL
  Administered 2015-06-15: 1000 mL
  Administered 2015-06-15: 3000 mL
  Administered 2015-06-15: 1000 mL

## 2015-06-15 SURGICAL SUPPLY — 62 items
BLADE CUTTER GATOR 3.5 (BLADE) ×2 IMPLANT
BLADE CUTTER MENIS 5.5 (BLADE) IMPLANT
BLADE SURG 15 STRL LF DISP TIS (BLADE) IMPLANT
BLADE SURG 15 STRL SS (BLADE)
BUR OVAL 4.0 (BURR) IMPLANT
BUR OVAL 6.0 (BURR) IMPLANT
CANISTER OMNI JUG 16 LITER (MISCELLANEOUS) ×2 IMPLANT
CANISTER SUCTION 2500CC (MISCELLANEOUS) IMPLANT
CANNULA SHOULDER 7CM (CANNULA) ×2 IMPLANT
CANNULA TWIST IN 8.25X7CM (CANNULA) IMPLANT
CONT SPEC 4OZ CLIKSEAL STRL BL (MISCELLANEOUS) ×1 IMPLANT
DRAPE ARTHROSCOPY W/POUCH 114 (DRAPES) ×1 IMPLANT
DRAPE IMP U-DRAPE 54X76 (DRAPES) ×2 IMPLANT
DRAPE INCISE IOBAN 66X45 STRL (DRAPES) IMPLANT
DRAPE ORTHO SPLIT 77X108 STRL (DRAPES) ×4
DRAPE PROXIMA HALF (DRAPES) ×2 IMPLANT
DRAPE STERI 35X30 U-POUCH (DRAPES) ×2 IMPLANT
DRAPE SURG ORHT 6 SPLT 77X108 (DRAPES) ×2 IMPLANT
DRAPE U-SHAPE 47X51 STRL (DRAPES) ×2 IMPLANT
DRAPE U-SHAPE 76X120 STRL (DRAPES) ×4 IMPLANT
DRSG EMULSION OIL 3X3 NADH (GAUZE/BANDAGES/DRESSINGS) ×3 IMPLANT
DRSG PAD ABDOMINAL 8X10 ST (GAUZE/BANDAGES/DRESSINGS) ×6 IMPLANT
DURAPREP 26ML APPLICATOR (WOUND CARE) ×2 IMPLANT
ELECT REM PT RETURN 9FT ADLT (ELECTROSURGICAL) ×2
ELECTRODE REM PT RTRN 9FT ADLT (ELECTROSURGICAL) ×1 IMPLANT
EVACUATOR 1/8 PVC DRAIN (DRAIN) ×1 IMPLANT
GAUZE SPONGE 4X4 12PLY STRL (GAUZE/BANDAGES/DRESSINGS) ×2 IMPLANT
GAUZE SPONGE 4X4 16PLY XRAY LF (GAUZE/BANDAGES/DRESSINGS) IMPLANT
GLOVE BIOGEL PI IND STRL 8.5 (GLOVE) ×1 IMPLANT
GLOVE BIOGEL PI INDICATOR 8.5 (GLOVE) ×1
GLOVE SURG ORTHO 8.5 STRL (GLOVE) ×2 IMPLANT
GLOVE SURG SS PI 7.0 STRL IVOR (GLOVE) ×2 IMPLANT
GOWN STRL REUS W/ TWL XL LVL3 (GOWN DISPOSABLE) ×1 IMPLANT
GOWN STRL REUS W/TWL 2XL LVL3 (GOWN DISPOSABLE) ×2 IMPLANT
GOWN STRL REUS W/TWL XL LVL3 (GOWN DISPOSABLE) ×4 IMPLANT
KIT BASIN OR (CUSTOM PROCEDURE TRAY) ×2 IMPLANT
NDL 18GX1X1/2 (RX/OR ONLY) (NEEDLE) ×1 IMPLANT
NEEDLE 18GX1X1/2 (RX/OR ONLY) (NEEDLE) ×2 IMPLANT
PACK ARTHROSCOPY DSU (CUSTOM PROCEDURE TRAY) ×2 IMPLANT
PACK UNIVERSAL I (CUSTOM PROCEDURE TRAY) ×2 IMPLANT
PENCIL BUTTON HOLSTER BLD 10FT (ELECTRODE) IMPLANT
SET ARTHROSCOPY TUBING (MISCELLANEOUS) ×2
SET ARTHROSCOPY TUBING LN (MISCELLANEOUS) ×1 IMPLANT
SLING ARM FOAM STRAP LRG (SOFTGOODS) IMPLANT
SLING ARM IMMOBILIZER LRG (SOFTGOODS) ×1 IMPLANT
SLING ARM IMMOBILIZER MED (SOFTGOODS) IMPLANT
SPONGE GAUZE 4X4 12PLY STER LF (GAUZE/BANDAGES/DRESSINGS) ×1 IMPLANT
SPONGE LAP 4X18 X RAY DECT (DISPOSABLE) IMPLANT
SUT ETHIBOND 2 OS 4 DA (SUTURE) IMPLANT
SUT ETHILON 3 0 PS 1 (SUTURE) IMPLANT
SUT ETHILON 4 0 PS 2 18 (SUTURE) ×2 IMPLANT
SUT MNCRL AB 4-0 PS2 18 (SUTURE) ×2 IMPLANT
SUT VIC AB 2-0 FS1 27 (SUTURE) IMPLANT
SUT VIC AB 2-0 SH 27 (SUTURE)
SUT VIC AB 2-0 SH 27XBRD (SUTURE) IMPLANT
SYR BULB 3OZ (MISCELLANEOUS) IMPLANT
TOWEL OR 17X24 6PK STRL BLUE (TOWEL DISPOSABLE) ×2 IMPLANT
TUBE CONNECTING 12X1/4 (SUCTIONS) IMPLANT
WAND HAND CNTRL MULTIVAC 90 (MISCELLANEOUS) ×1 IMPLANT
WAND TURBOVAC 50 DEGREE (SURGICAL WAND) ×2 IMPLANT
WATER STERILE IRR 1000ML POUR (IV SOLUTION) ×2 IMPLANT
YANKAUER SUCT BULB TIP NO VENT (SUCTIONS) IMPLANT

## 2015-06-15 NOTE — Transfer of Care (Signed)
Immediate Anesthesia Transfer of Care Note  Patient: Billy Lopez.  Procedure(s) Performed: Procedure(s): ARTHROSCOPIC SHOULDER IRRIGATION AND DEBRIDEMENT (Left)  Patient Location: PACU  Anesthesia Type:General  Level of Consciousness: awake, alert  and patient cooperative  Airway & Oxygen Therapy: Patient Spontanous Breathing and Patient connected to nasal cannula oxygen  Post-op Assessment: Report given to RN, Post -op Vital signs reviewed and stable, Patient moving all extremities and Patient moving all extremities X 4  Post vital signs: Reviewed and stable  Last Vitals:  Filed Vitals:   06/15/15 0656 06/15/15 1215  BP: 132/90 135/85  Pulse: 76 72  Temp: 37.1 C 37.8 C  Resp: 18 24    Complications: No apparent anesthesia complications

## 2015-06-15 NOTE — Progress Notes (Signed)
ANTIBIOTIC CONSULT NOTE - INITIAL  Pharmacy Consult for vancomycin Indication: Septic arthritis  No Known Allergies  Patient Measurements: Height: 5\' 8"  (172.7 cm) Weight: 168 lb (76.204 kg) IBW/kg (Calculated) : 68.4  Vital Signs: Temp: 99.3 F (37.4 C) (01/12 1749) Temp Source: Oral (01/12 1749) BP: 120/89 mmHg (01/12 1749) Pulse Rate: 80 (01/12 1749) Intake/Output from previous day: 01/11 0701 - 01/12 0700 In: -  Out: 500 [Urine:500] Intake/Output from this shift: Total I/O In: 1250 [I.V.:1250] Out: -   Labs:  Recent Labs  06/14/15 1019 06/14/15 1055 06/15/15 0525  WBC  --  7.9 8.1  HGB  --  12.1* 11.3*  PLT  --  201 184  CREATININE 1.24* 1.33* 1.18   Estimated Creatinine Clearance: 54.7 mL/min (by C-G formula based on Cr of 1.18). No results for input(s): VANCOTROUGH, VANCOPEAK, VANCORANDOM, GENTTROUGH, GENTPEAK, GENTRANDOM, TOBRATROUGH, TOBRAPEAK, TOBRARND, AMIKACINPEAK, AMIKACINTROU, AMIKACIN in the last 72 hours.   Microbiology: Recent Results (from the past 720 hour(s))  Urine culture     Status: None (Preliminary result)   Collection Time: 06/14/15 11:13 AM  Result Value Ref Range Status   Specimen Description URINE, CLEAN CATCH  Final   Special Requests NONE  Final   Culture   Final    40,000 COLONIES/ml STAPHYLOCOCCUS SPECIES (COAGULASE NEGATIVE) 1,000 COLONIES/mL GRAM NEGATIVE RODS    Report Status PENDING  Incomplete  Blood culture (routine x 2)     Status: None (Preliminary result)   Collection Time: 06/14/15 11:19 AM  Result Value Ref Range Status   Specimen Description BLOOD RIGHT ANTECUBITAL  Final   Special Requests BOTTLES DRAWN AEROBIC AND ANAEROBIC 5CC  Final   Culture NO GROWTH < 24 HOURS  Final   Report Status PENDING  Incomplete  Gram stain     Status: None   Collection Time: 06/15/15 12:51 PM  Result Value Ref Range Status   Specimen Description SYNOVIAL LEFT SHOULDER  Final   Special Requests PATIENT ON FOLLOWING ROCEPHIN   Final   Gram Stain   Final    ABUNDANT WBC PRESENT,BOTH PMN AND MONONUCLEAR NO ORGANISMS SEEN    Report Status 06/15/2015 FINAL  Final    Medical History: Past Medical History  Diagnosis Date  . Hypertension   . HIV infection ([place])   . Substance abuse   . 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 Allergic rhinitis, cause unspecified 07/21/2011  . Cervical spondylosis 07/21/2011  . Lumbar spondylosis 07/21/2011  . Osteoarthritis 07/21/2011  . COPD (chronic obstructive pulmonary disease) ([place]) 07/21/2011  . 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 Hx of radiation therapy 12/17/11 - 02/13/12    prostate  . Prostate cancer ([place]) 09/19/11    gleason 7  . H/O blood clots   . Pneumonia   . GERD (gastroesophageal reflux disease)   . Hepatitis     Hep C  . Rectal bleeding   . Wears dentures     top  . Wears glasses   . Septic joint of left shoulder region ([place]) 06/15/2015   Assessment: 40 YOM with hx of HIV, admitted with presumed left shoulder septic arthritis. Started vancomycin yesterday, then plan to hold off abx pending I&D to send cultures. But pt has been on rocephin. Now s/p surgery. Pharmacy is consulted to resume vanc. Pt. Received vancomycin loading dose 1.5g yesterday at 1500. Scr 1.8, est. crcl ~ 55 ml/mins   Vancomycin 1/11 x 1, 1/12 >> Rocephin 1/11 >> Cefepime 1/11 x 2 doses  1/11  Urine - CoNS 1/12 blood x 2 1/12 synovial fluid  Goal of Therapy:  Vancomycin trough level 15-20 mcg/ml  Plan:  - Vancomycin 750 mg IV Q 12 hrs - Monitor renal function and f/u cultures - Vancomycin trough at steady sate if indicated.  Maryanna Shape, PharmD, BCPS  Clinical Pharmacist  Pager: [phone]   06/15/2015,6:13 PM

## 2015-06-15 NOTE — Anesthesia Postprocedure Evaluation (Addendum)
Anesthesia Post Note  Patient: Billy Lopez.  Procedure(s) Performed: Procedure(s) (LRB): ARTHROSCOPIC SHOULDER IRRIGATION AND DEBRIDEMENT (Left)  Patient location during evaluation: PACU Anesthesia Type: General Level of consciousness: awake and alert and oriented Pain management: pain level controlled Vital Signs Assessment: post-procedure vital signs reviewed and stable Respiratory status: spontaneous breathing, nonlabored ventilation, respiratory function stable and patient connected to nasal cannula oxygen Cardiovascular status: blood pressure returned to baseline and stable Postop Assessment: no signs of nausea or vomiting Anesthetic complications: no           Chisom Muntean A.

## 2015-06-15 NOTE — Op Note (Signed)
06/14/2015 - 06/15/2015  2:31 PM  PATIENT:  Billy Lopez.    PRE-OPERATIVE DIAGNOSIS:  Septic Left Shoulder  POST-OPERATIVE DIAGNOSIS:  Same  PROCEDURE:  ARTHROSCOPIC SHOULDER IRRIGATION AND DEBRIDEMENT  SURGEON:  Johnny Bridge, MD  PHYSICIAN ASSISTANT: Joya Gaskins, OPA-C, present and scrubbed throughout the case, critical for completion in a timely fashion, and for retraction, instrumentation, and closure.  ANESTHESIA:   General  PREOPERATIVE INDICATIONS:  Billy Lopez. is a  75 y.o. male  Who has severe left shoulder pain for 2 days, fever, elevated sedimentation rate, CRP, with associated hepatitis and HIV, with clinical evidence for septic arthritis who elected for emergent surgical management.  The risks benefits and alternatives were discussed with the patient preoperatively including but not limited to the risks of infection, bleeding, nerve injury, cardiopulmonary complications, the need for revision surgery, among others, and the patient was willing to proceed.We also discussed the risks for the need for recurrent surgical intervention, progression of shoulder arthritis, inability to regain function, among others.  OPERATIVE IMPLANTS: We left a Hemovac drain 1  OPERATIVE FINDINGS: Chronic irreparable rotator cuff tear arthropathy with end stage glenohumeral degenerative changes and substantial fluid accumulation within the joint. The biceps tendon was not visible, the landmarks were almost unrecognizable of the shoulder.  OPERATIVE PROCEDURE: The patient is brought to the operating room and placed in the supine position. Gen. Anesthesia was administered. IV Rocephin was given per schedule, but not until after these cultures were taken intraoperatively. He was turned into the semilateral decubitus position and all bony prominences padded. The left upper extremity was prepped and draped in usual sterile fashion and hung in 10 pounds of traction.  A spinal needle was  used to aspirate a total of about 30 mL of turbid appearing joint fluid. Diagnostic arthroscopy was then carried out with the above-named findings. I irrigated a total of about 10 L of fluid through the shoulder, and I used the arthroscopic shaver and the ArthroCare wand to achieve hemostasis and debridement substantial necrotic tissue of the rotator cuff, as well as the subacromial bursa, there were a couple of loose fragments of bone in the anterior region, which were embedded within the fascia which I did not remove. These were fairly adherent to the subdeltoid fascia. I performed a complete debridement of both the glenohumeral joint, the acromioclavicular joint was also exposed, I did not expose it, but rather it was already evident within the subacromial space. I debrided this as well. Complete debridement carried out both from above and below the joint, and a drain was placed through the anterior cannula and then the instruments were removed, and the portals closed with nylon. He tolerated the procedure well and were no complications.

## 2015-06-15 NOTE — Progress Notes (Signed)
PATIENT DETAILS Name: Billy Lopez. Age: 75 y.o. Sex: male Date of Birth: 04/12/1941 Admit Date: 06/14/2015 Admitting Physician Waldemar Dickens, MD [id number] Jenny Reichmann, MD  Subjective: Continues to have left shoulder pain/swelling.  Assessment/Plan: Principal Problem: Presumed left shoulder septic arthritis: Hold IV antibiotics today, have consulted orthopedics for incision and drainage. Await blood cultures cultures. Orthopedics planning to send synovial fluid cultures from the OR. Will resume IV antibiotics postoperatively.  Active Problems: HIV: Last CD4 count on 05/16/15 360, continue antiretrovirals.  Acute on chronic kidney disease stage II: Resolved. Follow.  Hypertension: Controlled, continue enalapril, HCTZ. Discontinue Lasix.  BPH: Continue Flomax  History of prostate cancer: Continue Outpatient follow-up with urology  COPD: Continue as needed bronchodilators. Lungs are clear-up is stable for now  Disposition: Remain inpatient  Antimicrobial agents  See below  Anti-infectives    Start     Dose/Rate Route Frequency Ordered Stop   06/15/15 2200  emtricitabine-tenofovir AF (DESCOVY) 200-25 MG per tablet 1 tablet  Status:  Discontinued     1 tablet Oral Daily at bedtime 06/14/15 2032 06/14/15 2215   06/15/15 1000  [MAR Hold]  dolutegravir (TIVICAY) tablet 50 mg     (MAR Hold since 06/15/15 1224)   50 mg Oral Daily 06/14/15 2032     06/15/15 0300  vancomycin (VANCOCIN) IVPB 750 mg/150 ml premix  Status:  Discontinued     750 mg 150 mL/hr over 60 Minutes Intravenous Every 12 hours 06/14/15 1352 06/14/15 2232   06/14/15 2245  [MAR Hold]  cefTRIAXone (ROCEPHIN) 2 g in dextrose 5 % 50 mL IVPB     (MAR Hold since 06/15/15 1224)   2 g 100 mL/hr over 30 Minutes Intravenous Every 24 hours 06/14/15 2232     06/14/15 2230  [MAR Hold]  emtricitabine-tenofovir AF (DESCOVY) 200-25 MG per tablet 1 tablet     (MAR Hold since 06/15/15 1224)   1 tablet Oral Daily  at bedtime 06/14/15 2215     06/14/15 1400  ceFEPIme (MAXIPIME) 2 g in dextrose 5 % 50 mL IVPB  Status:  Discontinued     2 g 100 mL/hr over 30 Minutes Intravenous Every 12 hours 06/14/15 1351 06/14/15 2232   06/14/15 1400  vancomycin (VANCOCIN) 1,500 mg in sodium chloride 0.9 % 500 mL IVPB     1,500 mg 250 mL/hr over 120 Minutes Intravenous  Once 06/14/15 1351 06/14/15 1711      DVT Prophylaxis: Prophylactic Lovenox   Code Status: Full code   Family Communication None at bedside  Procedures: None  CONSULTS:  orthopedic surgery  Time spent 40 minutes-Greater than 50% of this time was spent in counseling, explanation of diagnosis, planning of further management, and coordination of care.  MEDICATIONS: Scheduled Meds: . [MAR Hold] aspirin EC  325 mg Oral BID  . [MAR Hold] cefTRIAXone (ROCEPHIN)  IV  2 g Intravenous Q24H  . [MAR Hold] cholecalciferol  1,000 Units Oral QPM  . [MAR Hold] dolutegravir  50 mg Oral Daily  . [MAR Hold] emtricitabine-tenofovir AF  1 tablet Oral QHS  . [MAR Hold] enalapril  20 mg Oral Daily  . [MAR Hold] enoxaparin (LOVENOX) injection  30 mg Subcutaneous Q24H  . [MAR Hold] feeding supplement (ENSURE ENLIVE)  237 mL Oral BID BM  . [MAR Hold] hydrochlorothiazide  25 mg Oral Daily  . [MAR Hold] HYDROcodone-acetaminophen  0.5 tablet Oral Q6H  . [facility]  Hold] insulin aspart  0-5 Units Subcutaneous QHS  . [MAR Hold] insulin aspart  0-9 Units Subcutaneous TID WC  . [MAR Hold] potassium chloride  10 mEq Oral Daily  . [MAR Hold] tamsulosin  0.4 mg Oral BID  . [MAR Hold] vitamin C  500 mg Oral QPM   Continuous Infusions: . lactated ringers 10 mL/hr at 06/15/15 1249   PRN Meds:.[MAR Hold] acetaminophen, [MAR Hold] alum & mag hydroxide-simeth, [MAR Hold] bisacodyl, [MAR Hold] magnesium citrate, [MAR Hold]  morphine injection, [MAR Hold] ondansetron **OR** [MAR Hold] ondansetron (ZOFRAN) IV, [MAR Hold] polyethylene glycol, sodium chloride irrigation, [MAR Hold]  traMADol, [MAR Hold] traZODone    PHYSICAL EXAM: Vital signs in last 24 hours: Filed Vitals:   06/14/15 1915 06/14/15 2009 06/15/15 0656 06/15/15 1215  BP:  111/70 132/90 135/85  Pulse:  97 76 72  Temp: 101.6 F (38.7 C) 100.4 F (38 C) 98.8 F (37.1 C) 100 F (37.8 C)  TempSrc: Oral Oral Oral Oral  Resp:  16 18 24   Height:      Weight:      SpO2:  100% 98% 100%    Weight change:  Filed Weights   06/14/15 1029  Weight: 76.204 kg (168 lb)   Body mass index is 25.55 kg/(m^2).   Gen Exam: Awake and alert with clear speech.  Neck: Supple, No JVD.   Chest: B/L Clear.   CVS: S1 S2 Regular, no murmurs.  Abdomen: soft, BS +, non tender, non distended.  Extremities: no edema, lower extremities warm to touch. Left shoulder significantly swollen, mildly tender. Neurologic: Non Focal.   Skin: No Rash.   Wounds: N/A.   Intake/Output from previous day:  Intake/Output Summary (Last 24 hours) at 06/15/15 1305 Last data filed at 06/15/15 0911  Gross per 24 hour  Intake      0 ml  Output    200 ml  Net   -200 ml     LAB RESULTS: CBC  Recent Labs Lab 06/14/15 1055 06/15/15 0525  WBC 7.9 8.1  HGB 12.1* 11.3*  HCT 36.8* 33.8*  PLT 201 184  MCV 88.9 88.5  MCH 29.2 29.6  MCHC 32.9 33.4  RDW 13.4 13.4  LYMPHSABS 1.1  --   MONOABS 0.9  --   EOSABS 0.0  --   BASOSABS 0.0  --     Chemistries   Recent Labs Lab 06/14/15 1019 06/14/15 1055 06/15/15 0525  NA 134* 134* 136  K 3.8 3.5 3.6  CL 103 104 107  CO2 21 21* 21*  GLUCOSE 129* 141* 136*  BUN 26* 22* 19  CREATININE 1.24* 1.33* 1.18  CALCIUM 8.9 9.2 8.6*    CBG:  Recent Labs Lab 06/14/15 2238 06/15/15 0759  GLUCAP 155* 104*    GFR Estimated Creatinine Clearance: 54.7 mL/min (by C-G formula based on Cr of 1.18).  Coagulation profile No results for input(s): INR, PROTIME in the last 168 hours.  Cardiac Enzymes No results for input(s): CKMB, TROPONINI, MYOGLOBIN in the last 168  hours.  Invalid input(s): CK  Invalid input(s): POCBNP No results for input(s): DDIMER in the last 72 hours. No results for input(s): HGBA1C in the last 72 hours. No results for input(s): CHOL, HDL, LDLCALC, TRIG, CHOLHDL, LDLDIRECT in the last 72 hours. No results for input(s): TSH, T4TOTAL, T3FREE, THYROIDAB in the last 72 hours.  Invalid input(s): FREET3 No results for input(s): VITAMINB12, FOLATE, FERRITIN, TIBC, IRON, RETICCTPCT in the last 72 hours. No results for input(s):  LIPASE, AMYLASE in the last 72 hours.  Urine Studies No results for input(s): UHGB, CRYS in the last 72 hours.  Invalid input(s): UACOL, UAPR, USPG, UPH, UTP, UGL, UKET, UBIL, UNIT, UROB, ULEU, UEPI, UWBC, URBC, UBAC, CAST, UCOM, BILUA  MICROBIOLOGY: Recent Results (from the past 240 hour(s))  Blood culture (routine x 2)     Status: None (Preliminary result)   Collection Time: 06/14/15 11:19 AM  Result Value Ref Range Status   Specimen Description BLOOD RIGHT ANTECUBITAL  Final   Special Requests BOTTLES DRAWN AEROBIC AND ANAEROBIC 5CC  Final   Culture NO GROWTH < 24 HOURS  Final   Report Status PENDING  Incomplete    RADIOLOGY STUDIES/RESULTS: Dg Chest 2 View  06/14/2015  CLINICAL DATA:  Fever 102.1 degrees earlier this morning, weakness, hypertension, COPD, HIV, former smoker EXAM: CHEST  2 VIEW COMPARISON:  10/11/2014 FINDINGS: Normal heart size and pulmonary vascularity. Elongation of thoracic aorta. Minimal bibasilar atelectasis. Lungs otherwise clear. No pleural effusion or pneumothorax. Probable BILATERAL chronic rotator cuff tears and glenohumeral degenerative changes. Scattered endplate spur formation thoracic spine. IMPRESSION: Minimal bibasilar atelectasis. Electronically Signed   By: Lavonia Dana M.D.   On: 06/14/2015 15:39   Mr Shoulder Left Wo Contrast  06/14/2015  CLINICAL DATA:  Shoulder pain and fever. EXAM: MRI OF THE LEFT SHOULDER WITHOUT CONTRAST TECHNIQUE: Multiplanar, multisequence MR  imaging of the shoulder was performed. No intravenous contrast was administered. COMPARISON:  Radiographs 06/14/2015 FINDINGS: Examination is quite limited. Only 3 sequences were completed. Patient refused further imaging. All of the sequences are degraded by motion artifact. Rotator cuff:  Large full-thickness retracted rotator cuff tear. Muscles:  Severe fatty atrophy of the rotator cuff muscles. Biceps long head:  Likely torn and retracted. Acromioclavicular Joint: AC joint degenerative changes. Possible prior acromioplasty changes. Glenohumeral Joint: Advanced glenohumeral joint degenerative changes with joint space narrowing, osteophytic spurring and subchondral cystic change. Large joint effusion and moderate synovitis. Labrum:  Degenerated and likely torn. Bones: There is a large and fairly extensive bone infarct involving the humeral head neck. I do not see any obvious destructive bony changes to suggest osteomyelitis or septic arthritis. Numerous loose bodies are noted in the glenohumeral joint and also in the bursa. IMPRESSION: 1. Very limited examination as discussed above. 2. Large full-thickness complete retracted rotator cuff tear. 3. Likely torn and retracted long head biceps tendon. 4. Advanced glenohumeral joint degenerative changes and moderate AC joint degenerative changes. Suspect previous surgery with probable prior acromioplasty. 5. Large amount of joint fluid and fluid in the bursa but I do not see any definite findings that would suggest this is septic arthritis or osteomyelitis. Large loose bodies are noted in the joint and in the bursa. 6. 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 Electronically Signed   By: Marijo Sanes M.D.   On: 06/14/2015 18:43   Dg Shoulder Left  06/14/2015  CLINICAL DATA:  Left shoulder pain and decreased range of motion, no known injury, initial encounter EXAM: LEFT SHOULDER - 2+ VIEW COMPARISON:  None. FINDINGS: Significant  degenerative changes of the left shoulder joint are noted. The humeral head is high-riding against the acromion suggestive of chronic rotator cuff injury. No acute fracture or dislocation is seen. IMPRESSION: Chronic degenerative changes as described. Electronically Signed   By: Inez Catalina M.D.   On: 06/14/2015 11:30    Oren Binet, MD  [facility] Pager:[phone]  If 7PM-7AM, please contact night-coverage [email or web address] Password [email or web address]  06/15/2015, 1:05 PM

## 2015-06-15 NOTE — Anesthesia Preprocedure Evaluation (Addendum)
Anesthesia Evaluation  Patient identified by MRN, date of birth, ID band Patient awake    Reviewed: Allergy & Precautions, NPO status , Patient's Chart, lab work & pertinent test results, reviewed documented beta blocker date and time   Airway Mallampati: I  TM Distance: >3 FB Neck ROM: Full    Dental  (+) Edentulous Upper, Edentulous Lower   Pulmonary pneumonia, resolved, COPD, former smoker,    Pulmonary exam normal breath sounds clear to auscultation       Cardiovascular hypertension, Pt. on medications Normal cardiovascular exam Rhythm:Regular Rate:Normal     Neuro/Psych PSYCHIATRIC DISORDERS negative neurological ROS     GI/Hepatic GERD  Medicated and Controlled,(+)     substance abuse  alcohol use, cocaine use, marijuana use and IV drug use, Hepatitis -, B, C  Endo/Other  negative endocrine ROSHIV (+)  Renal/GU Renal InsufficiencyRenal disease   Hx/o prostate Ca    Musculoskeletal  (+) Arthritis , Septic arthritis right shoulder Chronic low back pain   Abdominal   Peds  Hematology  (+) anemia ,   Anesthesia Other Findings   Reproductive/Obstetrics                           Anesthesia Physical Anesthesia Plan  ASA: III  Anesthesia Plan: General   Post-op Pain Management:    Induction: Intravenous  Airway Management Planned: Oral ETT  Additional Equipment:   Intra-op Plan:   Post-operative Plan: Extubation in OR  Informed Consent: I have reviewed the patients History and Physical, chart, labs and discussed the procedure including the risks, benefits and alternatives for the proposed anesthesia with the patient or authorized representative who has indicated his/her understanding and acceptance.   Dental advisory given  Plan Discussed with: CRNA, Anesthesiologist and Surgeon  Anesthesia Plan Comments:         Anesthesia Quick Evaluation

## 2015-06-15 NOTE — Anesthesia Procedure Notes (Signed)
Procedure Name: Intubation Date/Time: 06/15/2015 1:36 PM Performed by: Izora Gala Pre-anesthesia Checklist: Patient identified, Emergency Drugs available, Suction available, Patient being monitored and Timeout performed Patient Re-evaluated:Patient Re-evaluated prior to inductionOxygen Delivery Method: Circle system utilized Preoxygenation: Pre-oxygenation with 100% oxygen Intubation Type: IV induction Ventilation: Mask ventilation without difficulty Laryngoscope Size: Miller and 3 Grade View: Grade I Tube type: Oral Tube size: 7.5 mm Number of attempts: 1 Airway Equipment and Method: Stylet and LTA kit utilized Placement Confirmation: ETT inserted through vocal cords under direct vision,  positive ETCO2 and breath sounds checked- equal and bilateral Secured at: 22 cm Tube secured with: Tape Dental Injury: Teeth and Oropharynx as per pre-operative assessment

## 2015-06-15 NOTE — Progress Notes (Signed)
PT Cancellation Note  Patient Details Name: Billy Lopez. MRN: [id number] DOB: 12-12-1940   Cancelled Treatment:    Reason Eval/Treat Not Completed: Medical issues which prohibited therapy. Pt scheduled for shoulder surgery today.  Will need new order post-op. Thank you.   Wylma Tatem LUBECK 06/15/2015, 12:22 PM

## 2015-06-15 NOTE — Consult Note (Signed)
ORTHOPAEDIC CONSULTATION  REQUESTING PHYSICIAN: Jonetta Osgood, MD  Chief Complaint: Left shoulder pain  HPI: Billy Lopez. is a 75 y.o. male who complains of  severe left shoulder pain for the past 2 days. He denies significant pre-existing shoulder problems. He has hepatitis C as well as HIV, and history of polysubstance abuse and heroin abuse. He has noted significant swelling, malaise, was recently seen at the infectious disease office, and has an undetectable viral load. He does however have a low CD4 count. He reports some chills as well as subjective fever, and had an objective fever of 102 at the infectious disease appointment. He was transferred to the emergency room. Orthopedic consultation was requested. That was done this morning.  Past Medical History  Diagnosis Date  . Hypertension   . HIV infection ([place])   . Substance abuse   . Heroin withdrawal ([place]) 07/21/2011  . Allergic rhinitis, cause unspecified 07/21/2011  . Cervical spondylosis 07/21/2011  . Lumbar spondylosis 07/21/2011  . Osteoarthritis 07/21/2011  . COPD (chronic obstructive pulmonary disease) ([place]) 07/21/2011  . 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 Hx of radiation therapy 12/17/11 - 02/13/12    prostate  . Prostate cancer ([place]) 09/19/11    gleason 7  . H/O blood clots   . Pneumonia   . GERD (gastroesophageal reflux disease)   . Hepatitis     Hep C  . Rectal bleeding   . Wears dentures     top  . Wears glasses    Past Surgical History  Procedure Laterality Date  . Hip surgery      total bilateral replacement  . Hernia repair    . Hand surgery      right thumb  . Foot surgery      bilat. ingrown toenails removed  . Colonoscopy N/A 11/17/2012    Procedure: COLONOSCOPY;  Surgeon: Ladene Artist, MD;  Location: [facility];  Service: Endoscopy;  Laterality: N/A;  possible APC  . Amputation Right 03/31/2014    Procedure: RIGHT SECOND TOE AMPUTATION ;  Surgeon: Wylene Simmer, MD;  Location: [facility];  Service: Orthopedics;  Laterality: Right;  . Total hip revision Left 10/08/2014    Procedure: POSTERIOR LEFT TOTAL HIP REVISION;  Surgeon: Paralee Cancel, MD;  Location: [facility];  Service: Orthopedics;  Laterality: Left;   Social History   Social History  . Marital Status: Divorced    Spouse Name: N/A  . Number of Children: 3  . Years of Education: 16   Occupational History  . UNEMPLOYED   . Retired     Social worker   Social History Main Topics  . Smoking status: Former Smoker -- 1.00 packs/day for 10 years    Types: Cigarettes    Quit date: 10/13/1960  . Smokeless tobacco: Never Used  . Alcohol Use: No  . Drug Use: No     Comment: pt. states stopped using drugs 10/12  . Sexual Activity: Not Currently     Comment: pt. given condoms   Other Topics Concern  . None   Social History Narrative   Separated x 7 years   3 daughters    Culture:mother is [place] and father hispanic   Speaks [place], [place], [place] and [place].         Family History  Problem Relation Age of Onset  . Alcohol abuse Other   . Arthritis Other   . Hypertension Other   . Colon cancer Neg Hx   .  Diabetes Neg Hx   . Cancer Mother     unsure of type   No Known Allergies   Positive ROS: All other systems have been reviewed and were otherwise negative with the exception of those mentioned in the HPI and as above.  Physical Exam: General: Alert, no acute distress Cardiovascular: No pedal edema Respiratory: No cyanosis, no use of accessory musculature GI: No organomegaly, abdomen is soft and non-tender Skin: No lesions in the area of chief complaint, he has minimal redness over the shoulder Neurologic: Sensation intact distally Psychiatric: Patient is competent for consent with normal mood and affect Lymphatic: No axillary or cervical lymphadenopathy  MUSCULOSKELETAL: Left shoulder has no active motion and severe pain with passive motion even as little as 20. He has a large soft  tissue fluid accumulation, and I suspect has a fair amount of purulence within the shoulder clinically.  Assessment: Principal Problem:   Left shoulder pain Active Problems:   Human immunodeficiency virus (HIV) disease (HCC)   Chronic hepatitis C without hepatic coma (HCC)   Essential hypertension   Substance abuse   Osteoarthritis   COPD (chronic obstructive pulmonary disease) (H[facility])   Chronic renal insufficiency, stage II (mild)   Fever   I do believe that he likely has a septic left shoulder.  Plan:  I had initially considered aspiration prior to surgical intervention, however he has enough clinical signs that I think it is most prudent to proceed with surgical intervention with arthroscopic washout. It is possible that even with an aspiration given his low CD count and immunosuppressed status that an elevated white blood cell count on the aspiration might be a false negative, therefore I recommend going ahead and proceeding with surgical intervention.  The risks benefits and alternatives were discussed at length, he has had nothing by mouth since last night, all questions been answered, we'll plan to proceed with urgent left shoulder arthroscopic debridement as well as aspiration culture and sensitivity. He may need multiple serial washouts depending on the severity of the infection. I would also recommend IV antibiotics per the infectious disease and medical services recommendations.  Surgery may be early this afternoon if room is available otherwise early this evening hopefully.   Johnny Bridge, MD Cell [phone]   06/15/2015 10:13 AM

## 2015-06-16 ENCOUNTER — Encounter (HOSPITAL_COMMUNITY): Payer: Self-pay | Admitting: Orthopedic Surgery

## 2015-06-16 ENCOUNTER — Telehealth: Payer: Self-pay | Admitting: Oncology

## 2015-06-16 DIAGNOSIS — M009 Pyogenic arthritis, unspecified: Secondary | ICD-10-CM

## 2015-06-16 DIAGNOSIS — B957 Other staphylococcus as the cause of diseases classified elsewhere: Secondary | ICD-10-CM

## 2015-06-16 DIAGNOSIS — M25512 Pain in left shoulder: Secondary | ICD-10-CM

## 2015-06-16 LAB — CBC
HCT: 32.6 % — ABNORMAL LOW (ref 39.0–52.0)
Hemoglobin: 11.2 g/dL — ABNORMAL LOW (ref 13.0–17.0)
MCH: 29.7 pg (ref 26.0–34.0)
MCHC: 34.4 g/dL (ref 30.0–36.0)
MCV: 86.5 fL (ref 78.0–100.0)
PLATELETS: 189 10*3/uL (ref 150–400)
RBC: 3.77 MIL/uL — AB (ref 4.22–5.81)
RDW: 13.3 % (ref 11.5–15.5)
WBC: 8.1 10*3/uL (ref 4.0–10.5)

## 2015-06-16 LAB — GLUCOSE, CAPILLARY
GLUCOSE-CAPILLARY: 152 mg/dL — AB (ref 65–99)
Glucose-Capillary: 149 mg/dL — ABNORMAL HIGH (ref 65–99)
Glucose-Capillary: 538 mg/dL — ABNORMAL HIGH (ref 65–99)
Glucose-Capillary: 146 mg/dL — ABNORMAL HIGH (ref 65–99)
Glucose-Capillary: 133 mg/dL — ABNORMAL HIGH (ref 65–99)

## 2015-06-16 LAB — URINE CULTURE

## 2015-06-16 LAB — BASIC METABOLIC PANEL
Anion gap: 7 (ref 5–15)
BUN: 12 mg/dL (ref 6–20)
CALCIUM: 8.3 mg/dL — AB (ref 8.9–10.3)
CO2: 19 mmol/L — ABNORMAL LOW (ref 22–32)
CREATININE: 1.14 mg/dL (ref 0.61–1.24)
Chloride: 108 mmol/L (ref 101–111)
GFR calc Af Amer: >60 mL/min (ref >60)
Glucose, Bld: 161 mg/dL — ABNORMAL HIGH (ref 65–99)
POTASSIUM: 3.3 mmol/L — AB (ref 3.5–5.1)
SODIUM: 134 mmol/L — AB (ref 135–145)

## 2015-06-16 MED ORDER — POTASSIUM CHLORIDE CRYS ER 20 MEQ PO TBCR
40.0000 meq | EXTENDED_RELEASE_TABLET | Freq: Once | ORAL | Status: AC
  Administered 2015-06-16: 40 meq via ORAL
  Filled 2015-06-16: qty 2

## 2015-06-16 MED ORDER — ENOXAPARIN SODIUM 40 MG/0.4ML ~~LOC~~ SOLN
40.0000 mg | SUBCUTANEOUS | Status: DC
  Administered 2015-06-16 – 2015-06-18 (×3): 40 mg via SUBCUTANEOUS
  Filled 2015-06-16 (×3): qty 0.4

## 2015-06-16 NOTE — Progress Notes (Signed)
Orthopedic Tech Progress Note Patient Details:  Billy Lopez 1941/02/05 [id number]  Patient ID: [name], male   DOB: 01/20/1941, 75 y.o.   MRN: [id number] Pt stated that he is able to get both in and out of bed without use of ohf; RN notified  Hildred Priest 06/16/2015, 8:54 AM

## 2015-06-16 NOTE — Telephone Encounter (Signed)
Pt confirmed new pt referral appt.  Mailed out new pt packet

## 2015-06-16 NOTE — Progress Notes (Signed)
PATIENT DETAILS Name: Johah Montelongo. Age: 75 y.o. Sex: male Date of Birth: 02-05-1941 Admit Date: 06/14/2015 Admitting Physician Waldemar Dickens, MD [id number] Jenny Reichmann, MD  Subjective: Pain at the left shoulder-operative site. Otherwise doing well.  Assessment/Plan: Principal Problem: Presumed left shoulder septic arthritis:  Admitted with left shoulder swelling/pain along with fever. Underwent incision and drainage of left shoulder on 06/15/15. Synovial fluid cultures negative so far, however positive  for calcium pyrophosphate crystals-indicating pseudogout. Reviewed  Operative note- there was some muscle necrosis -hence could have a concomitant infection as well. Infectious disease consulted. Will continue to follow closely  Active Problems: HIV: Last CD4 count on 05/16/15 360, continue antiretrovirals.  Acute on chronic kidney disease stage II: Resolved. Follow.  Hypokalemia: Replete and recheck  ?[id number] A1C, follow CBG's on SSI  Hypertension: Controlled, continue enalapril, HCTZ. Discontinue Lasix.  BPH: Continue Flomax  History of prostate cancer: Continue Outpatient follow-up with urology  COPD: Continue as needed bronchodilators. Lungs are clear-up is stable for now  Disposition: Remain inpatient  Antimicrobial agents  See below  Anti-infectives    Start     Dose/Rate Route Frequency Ordered Stop   06/15/15 2200  emtricitabine-tenofovir AF (DESCOVY) 200-25 MG per tablet 1 tablet  Status:  Discontinued     1 tablet Oral Daily at bedtime 06/14/15 2032 06/14/15 2215   06/15/15 1845  vancomycin (VANCOCIN) IVPB 750 mg/150 ml premix     750 mg 150 mL/hr over 60 Minutes Intravenous Every 12 hours 06/15/15 1830     06/15/15 1000  dolutegravir (TIVICAY) tablet 50 mg     50 mg Oral Daily 06/14/15 2032     06/15/15 0300  vancomycin (VANCOCIN) IVPB 750 mg/150 ml premix  Status:  Discontinued     750 mg 150 mL/hr over 60 Minutes Intravenous Every 12  hours 06/14/15 1352 06/14/15 2232   06/14/15 2245  cefTRIAXone (ROCEPHIN) 2 g in dextrose 5 % 50 mL IVPB     2 g 100 mL/hr over 30 Minutes Intravenous Every 24 hours 06/14/15 2232     06/14/15 2230  emtricitabine-tenofovir AF (DESCOVY) 200-25 MG per tablet 1 tablet     1 tablet Oral Daily at bedtime 06/14/15 2215     06/14/15 1400  ceFEPIme (MAXIPIME) 2 g in dextrose 5 % 50 mL IVPB  Status:  Discontinued     2 g 100 mL/hr over 30 Minutes Intravenous Every 12 hours 06/14/15 1351 06/14/15 2232   06/14/15 1400  vancomycin (VANCOCIN) 1,500 mg in sodium chloride 0.9 % 500 mL IVPB     1,500 mg 250 mL/hr over 120 Minutes Intravenous  Once 06/14/15 1351 06/14/15 1711      DVT Prophylaxis: Prophylactic Lovenox   Code Status: Full code   Family Communication None at bedside  Procedures: None  CONSULTS:  orthopedic surgery  Time spent 30 minutes-Greater than 50% of this time was spent in counseling, explanation of diagnosis, planning of further management, and coordination of care.  MEDICATIONS: Scheduled Meds: . aspirin EC  325 mg Oral BID  . cefTRIAXone (ROCEPHIN)  IV  2 g Intravenous Q24H  . cholecalciferol  1,000 Units Oral QPM  . docusate sodium  100 mg Oral BID  . dolutegravir  50 mg Oral Daily  . emtricitabine-tenofovir AF  1 tablet Oral QHS  . enalapril  20 mg Oral Daily  . enoxaparin (LOVENOX) injection  40 mg Subcutaneous  Q24H  . feeding supplement (ENSURE ENLIVE)  237 mL Oral BID BM  . hydrochlorothiazide  25 mg Oral Daily  . HYDROcodone-acetaminophen  0.5 tablet Oral Q6H  . insulin aspart  0-5 Units Subcutaneous QHS  . insulin aspart  0-9 Units Subcutaneous TID WC  . potassium chloride  10 mEq Oral Daily  . senna  1 tablet Oral BID  . tamsulosin  0.4 mg Oral BID  . vancomycin  750 mg Intravenous Q12H  . vitamin C  500 mg Oral QPM   Continuous Infusions: . lactated ringers 10 mL/hr at 06/15/15 1249   PRN Meds:.acetaminophen, alum & mag hydroxide-simeth,  bisacodyl, diphenhydrAMINE, magnesium citrate, menthol-cetylpyridinium **OR** phenol, methocarbamol **OR** methocarbamol (ROBAXIN)  IV, metoCLOPramide **OR** metoCLOPramide (REGLAN) injection, morphine injection, ondansetron **OR** ondansetron (ZOFRAN) IV, oxyCODONE, polyethylene glycol, traMADol, traZODone    PHYSICAL EXAM: Vital signs in last 24 hours: Filed Vitals:   06/15/15 2140 06/16/15 0142 06/16/15 0651 06/16/15 1341  BP: 123/79 113/79 129/85 93/58  Pulse: 86 90 86 95  Temp: 102 F (38.9 C) 99.1 F (37.3 C) 98.9 F (37.2 C) 99.7 F (37.6 C)  TempSrc: Oral Oral Oral Oral  Resp: 20  16 16   Height:      Weight:      SpO2: 99% 98% 99% 100%    Weight change:  Filed Weights   06/14/15 1029  Weight: 76.204 kg (168 lb)   Body mass index is 25.55 kg/(m^2).   Gen Exam: Awake and alert with clear speech.  Neck: Supple, No JVD.   Chest: B/L Clear anteriorly CVS: S1 S2 Regular, no murmurs.  Abdomen: soft, BS +, non tender, non distended.  Extremities: no edema, lower extremities warm to touch. Left shoulder bandaged-did not open Neurologic: Non Focal.   Skin: No Rash.   Wounds: N/A.   Intake/Output from previous day:  Intake/Output Summary (Last 24 hours) at 06/16/15 1542 Last data filed at 06/16/15 1433  Gross per 24 hour  Intake   1120 ml  Output   1750 ml  Net   -630 ml     LAB RESULTS: CBC  Recent Labs Lab 06/14/15 1055 06/15/15 0525 06/16/15 0619  WBC 7.9 8.1 8.1  HGB 12.1* 11.3* 11.2*  HCT 36.8* 33.8* 32.6*  PLT 201 184 189  MCV 88.9 88.5 86.5  MCH 29.2 29.6 29.7  MCHC 32.9 33.4 34.4  RDW 13.4 13.4 13.3  LYMPHSABS 1.1  --   --   MONOABS 0.9  --   --   EOSABS 0.0  --   --   BASOSABS 0.0  --   --     Chemistries   Recent Labs Lab 06/14/15 1019 06/14/15 1055 06/15/15 0525 06/16/15 0619  NA 134* 134* 136 134*  K 3.8 3.5 3.6 3.3*  CL 103 104 107 108  CO2 21 21* 21* 19*  GLUCOSE 129* 141* 136* 161*  BUN 26* 22* 19 12  CREATININE 1.24*  1.33* 1.18 1.14  CALCIUM 8.9 9.2 8.6* 8.3*    CBG:  Recent Labs Lab 06/15/15 1745 06/15/15 2137 06/16/15 0737 06/16/15 1152 06/16/15 1227  GLUCAP 125* 154* 149* 538* 152*    GFR Estimated Creatinine Clearance: 56.7 mL/min (by C-G formula based on Cr of 1.14).  Coagulation profile No results for input(s): INR, PROTIME in the last 168 hours.  Cardiac Enzymes No results for input(s): CKMB, TROPONINI, MYOGLOBIN in the last 168 hours.  Invalid input(s): CK  Invalid input(s): POCBNP No results for input(s): DDIMER in  the last 72 hours. No results for input(s): HGBA1C in the last 72 hours. No results for input(s): CHOL, HDL, LDLCALC, TRIG, CHOLHDL, LDLDIRECT in the last 72 hours. No results for input(s): TSH, T4TOTAL, T3FREE, THYROIDAB in the last 72 hours.  Invalid input(s): FREET3 No results for input(s): VITAMINB12, FOLATE, FERRITIN, TIBC, IRON, RETICCTPCT in the last 72 hours. No results for input(s): LIPASE, AMYLASE in the last 72 hours.  Urine Studies No results for input(s): UHGB, CRYS in the last 72 hours.  Invalid input(s): UACOL, UAPR, USPG, UPH, UTP, UGL, UKET, UBIL, UNIT, UROB, ULEU, UEPI, UWBC, URBC, UBAC, CAST, UCOM, BILUA  MICROBIOLOGY: Recent Results (from the past 240 hour(s))  Urine culture     Status: None   Collection Time: 06/14/15 11:13 AM  Result Value Ref Range Status   Specimen Description URINE, CLEAN CATCH  Final   Special Requests NONE  Final   Culture   Final    40,000 COLONIES/ml STAPHYLOCOCCUS SPECIES (COAGULASE NEGATIVE) 1,000 COLONIES/mL GRAM NEGATIVE RODS    Report Status 06/16/2015 FINAL  Final   Organism ID, Bacteria STAPHYLOCOCCUS SPECIES (COAGULASE NEGATIVE)  Final      Susceptibility   Staphylococcus species (coagulase negative) - MIC*    CIPROFLOXACIN <=0.5 SENSITIVE Sensitive     GENTAMICIN <=0.5 SENSITIVE Sensitive     NITROFURANTOIN 32 SENSITIVE Sensitive     OXACILLIN <=0.25 SENSITIVE Sensitive     TETRACYCLINE <=1  SENSITIVE Sensitive     VANCOMYCIN 1 SENSITIVE Sensitive     TRIMETH/SULFA <=10 SENSITIVE Sensitive     CLINDAMYCIN <=0.25 SENSITIVE Sensitive     RIFAMPIN <=0.5 SENSITIVE Sensitive     Inducible Clindamycin NEGATIVE Sensitive     * 40,000 COLONIES/ml STAPHYLOCOCCUS SPECIES (COAGULASE NEGATIVE)  Blood culture (routine x 2)     Status: None (Preliminary result)   Collection Time: 06/14/15 11:19 AM  Result Value Ref Range Status   Specimen Description BLOOD RIGHT ANTECUBITAL  Final   Special Requests BOTTLES DRAWN AEROBIC AND ANAEROBIC 5CC  Final   Culture NO GROWTH 2 DAYS  Final   Report Status PENDING  Incomplete  Blood culture (routine x 2)     Status: None (Preliminary result)   Collection Time: 06/14/15  9:25 PM  Result Value Ref Range Status   Specimen Description BLOOD LEFT FOREARM  Final   Special Requests BOTTLES DRAWN AEROBIC AND ANAEROBIC 10CC  Final   Culture NO GROWTH 1 DAY  Final   Report Status PENDING  Incomplete  Gram stain     Status: None   Collection Time: 06/15/15 12:51 PM  Result Value Ref Range Status   Specimen Description SYNOVIAL LEFT SHOULDER  Final   Special Requests PATIENT ON FOLLOWING ROCEPHIN  Final   Gram Stain   Final    ABUNDANT WBC PRESENT,BOTH PMN AND MONONUCLEAR NO ORGANISMS SEEN    Report Status 06/15/2015 FINAL  Final  Body fluid culture     Status: None (Preliminary result)   Collection Time: 06/15/15 12:51 PM  Result Value Ref Range Status   Specimen Description SYNOVIAL LEFT SHOULDER  Final   Special Requests PATIENT ON FOLLOWING ROCEPHIN  Final   Gram Stain   Final    ABUNDANT WBC PRESENT,BOTH PMN AND MONONUCLEAR NO ORGANISMS SEEN    Culture NO GROWTH < 24 HOURS  Final   Report Status PENDING  Incomplete    RADIOLOGY STUDIES/RESULTS: Dg Chest 2 View  06/14/2015  CLINICAL DATA:  Fever 102.1 degrees earlier this morning, weakness,  hypertension, COPD, HIV, former smoker EXAM: CHEST  2 VIEW COMPARISON:  10/11/2014 FINDINGS: Normal  heart size and pulmonary vascularity. Elongation of thoracic aorta. Minimal bibasilar atelectasis. Lungs otherwise clear. No pleural effusion or pneumothorax. Probable BILATERAL chronic rotator cuff tears and glenohumeral degenerative changes. Scattered endplate spur formation thoracic spine. IMPRESSION: Minimal bibasilar atelectasis. Electronically Signed   By: Lavonia Dana M.D.   On: 06/14/2015 15:39   Mr Shoulder Left Wo Contrast  06/14/2015  CLINICAL DATA:  Shoulder pain and fever. EXAM: MRI OF THE LEFT SHOULDER WITHOUT CONTRAST TECHNIQUE: Multiplanar, multisequence MR imaging of the shoulder was performed. No intravenous contrast was administered. COMPARISON:  Radiographs 06/14/2015 FINDINGS: Examination is quite limited. Only 3 sequences were completed. Patient refused further imaging. All of the sequences are degraded by motion artifact. Rotator cuff:  Large full-thickness retracted rotator cuff tear. Muscles:  Severe fatty atrophy of the rotator cuff muscles. Biceps long head:  Likely torn and retracted. Acromioclavicular Joint: AC joint degenerative changes. Possible prior acromioplasty changes. Glenohumeral Joint: Advanced glenohumeral joint degenerative changes with joint space narrowing, osteophytic spurring and subchondral cystic change. Large joint effusion and moderate synovitis. Labrum:  Degenerated and likely torn. Bones: There is a large and fairly extensive bone infarct involving the humeral head neck. I do not see any obvious destructive bony changes to suggest osteomyelitis or septic arthritis. Numerous loose bodies are noted in the glenohumeral joint and also in the bursa. IMPRESSION: 1. Very limited examination as discussed above. 2. Large full-thickness complete retracted rotator cuff tear. 3. Likely torn and retracted long head biceps tendon. 4. Advanced glenohumeral joint degenerative changes and moderate AC joint degenerative changes. Suspect previous surgery with probable prior  acromioplasty. 5. Large amount of joint fluid and fluid in the bursa but I do not see any definite findings that would suggest this is septic arthritis or osteomyelitis. Large loose bodies are noted in the joint and in the bursa. 6. 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 Electronically Signed   By: Marijo Sanes M.D.   On: 06/14/2015 18:43   Dg Shoulder Left  06/14/2015  CLINICAL DATA:  Left shoulder pain and decreased range of motion, no known injury, initial encounter EXAM: LEFT SHOULDER - 2+ VIEW COMPARISON:  None. FINDINGS: Significant degenerative changes of the left shoulder joint are noted. The humeral head is high-riding against the acromion suggestive of chronic rotator cuff injury. No acute fracture or dislocation is seen. IMPRESSION: Chronic degenerative changes as described. Electronically Signed   By: Inez Catalina M.D.   On: 06/14/2015 11:30    Oren Binet, MD  [facility] Pager:[phone]  If 7PM-7AM, please contact night-coverage [email or web address] Password [email or web address] 06/16/2015, 3:42 PM   LOS: 1 day

## 2015-06-16 NOTE — Evaluation (Signed)
Occupational Therapy Evaluation Patient Details Name: Patty Tham. MRN: [id number] DOB: 05/26/1941 Today's Date: 06/16/2015    History of Present Illness 75 yo male here for follow up of HIV and renal insufficiency. He also has complaints of acute left shoulder pain and swelling since 06/13/15. Patient is s/p L ARTHROSCOPIC SHOULDER IRRIGATION AND DEBRIDEMENT   Clinical Impression   Patient presenting with decreased ADL and functional mobility independence & decreased awareness of precausions and exercises secondary to above. Patient supervision to mod I PTA. Patient currently functioning at an overall supervision to mod assist level (mod assist for LB ADLS). Patient will benefit from acute OT to increase overall independence in the areas of ADLs, functional mobility, education on LUE HEP & precautions/restricitions, and overall safety in order to safely discharge home with assistance from PCA from 2-7 daily.     Follow Up Recommendations  Other (comment) (OPOT once MD feels appropriate)    Equipment Recommendations  None recommended by OT    Recommendations for Other Services  None at this time   Precautions / Restrictions Precautions Precautions: Shoulder;Fall Shoulder Interventions: Shoulder sling/immobilizer;For comfort Restrictions Weight Bearing Restrictions: Yes LUE Weight Bearing: Non weight bearing      Mobility Bed Mobility Overal bed mobility: Modified Independent General bed mobility comments: no use of bed rails and pt able to adhere to NWB to LUE  Transfers Overall transfer level: Modified independent Equipment used: Straight cane General transfer comment: no cues of physical assistance needed    Balance Overall balance assessment: No apparent balance deficits (not formally assessed);Modified Independent (with use of cane)    ADL Overall ADL's : Needs assistance/impaired Eating/Feeding: Set up;Sitting   Grooming: Set up;Sitting   Upper Body Bathing:  Moderate assistance;Sitting   Lower Body Bathing: Moderate assistance;Sit to/from stand   Upper Body Dressing : Moderate assistance;Sitting Upper Body Dressing Details (indicate cue type and reason): needs assistance with donning/doffing of sling Lower Body Dressing: Moderate assistance;Sit to/from stand   Toilet Transfer: Supervision/safety;Ambulation;Comfort height toilet Toilet Transfer Details (indicate cue type and reason): using cane Toileting- Clothing Manipulation and Hygiene: Supervision/safety;Sit to/from Nurse, children's Details (indicate cue type and reason): did not occur Functional mobility during ADLs: Supervision/safety;Modified independent;Cane General ADL Comments: Pt receiving assistance from PCA PTA. Continue to recommend PCA post acute d/c. Pt overall supervision to mod I with functional ambulation/mobility using straight cane.   Pt reports that he uses AE to increase independence with LB ADLs.    Pertinent Vitals/Pain Pain Assessment: 0-10 Pain Score: 6  Pain Location: left shoulder  Pain Descriptors / Indicators: Operative site guarding;Discomfort Pain Intervention(s): Limited activity within patient's tolerance;Monitored during session;Repositioned     Hand Dominance Right   Extremity/Trunk Assessment Upper Extremity Assessment Upper Extremity Assessment: Difficult to assess due to impaired cognition (recent I&D of shoulder, no order for shoulder ROM; waiting on doctor approval)   Lower Extremity Assessment Lower Extremity Assessment: Overall WFL for tasks assessed   Cervical / Trunk Assessment Cervical / Trunk Assessment: Normal   Communication Communication Communication: No difficulties   Cognition Arousal/Alertness: Awake/alert Behavior During Therapy: WFL for tasks assessed/performed Overall Cognitive Status: Within Functional Limits for tasks assessed         Shoulder Instructions Shoulder Instructions Donning/doffing shirt  without moving shoulder: Minimal assistance Method for sponge bathing under operated UE: Moderate assistance Donning/doffing sling/immobilizer: Moderate assistance Correct positioning of sling/immobilizer: Minimal assistance Pendulum exercises (written home exercise program):  (n/a) ROM for elbow, wrist  and digits of operated UE: Supervision/safety Sling wearing schedule (on at all times/off for ADL's): Supervision/safety Proper positioning of operated UE when showering: Supervision/safety Dressing change:  (n/a) Positioning of UE while sleeping: Supervision/safety   **For exercises completed and educated on, please see navigator or doc flow sheets**    Home Living Family/patient expects to be discharged to:: Private residence Living Arrangements: Alone Available Help at Discharge: Personal care attendant (2-7 5 days a week) Type of Home: [place]: One level     Bathroom Shower/Tub: [place] unit;Curtain   Biochemist, clinical: [place]: Environmental consultant - 2 wheels;Wheelchair - Passenger transport manager - single point;Bedside commode;Shower seat    Prior Functioning/Environment Level of Independence: Independent     OT Diagnosis: Generalized weakness;Acute pain   OT Problem List: Decreased strength;Decreased range of motion;Decreased activity tolerance;Decreased safety awareness;Decreased knowledge of use of DME or AE;Decreased knowledge of precautions   OT Treatment/Interventions: Self-care/ADL training;Therapeutic exercise;Energy conservation;DME and/or AE instruction;Therapeutic activities;Patient/family education;Balance training    OT Goals(Current goals can be found in the care plan section) Acute Rehab OT Goals Patient Stated Goal: go home soon OT Goal Formulation: With patient Time For Goal Achievement: 06/30/15 Potential to Achieve Goals: Good ADL Goals Pt Will Perform Grooming: with modified  independence;standing Pt Will Perform Lower Body Bathing: with modified independence;sit to/from stand;with adaptive equipment Pt Will Perform Lower Body Dressing: with modified independence;sit to/from stand;with adaptive equipment Pt Will Transfer to Toilet: with modified independence;ambulating;bedside commode Pt/caregiver will Perform Home Exercise Program: Increased ROM;Increased strength;With Supervision;With written HEP provided;Left upper extremity (elbow, wrist, hand only for now (need dr approval for slder)) Additional ADL Goal #1: Pt will indepenently don/doff sling  OT Frequency: Min 3X/week   Barriers to D/C: Decreased caregiver support  Pt states he does have a PCA available from 2-7 daily    End of Session Equipment Utilized During Treatment: Other (comment) (cane and sling)  Activity Tolerance: Patient tolerated treatment well Patient left: in chair;with call bell/phone within reach;with chair alarm set   Time: [id number] OT Time Calculation (min): 33 min Charges:  OT General Charges $OT Visit: 1 Procedure OT Evaluation $OT Eval Low Complexity: 1 Procedure OT Treatments $Therapeutic Exercise: 8-22 mins  Chrys Racer , MS, OTR/L, CLT Pager: [phone]  06/16/2015, 10:39 AM

## 2015-06-16 NOTE — Progress Notes (Signed)
Patient ID: Billy Brink., male   DOB: Oct 03, 1940, 76 y.o.   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 aspirin EC  325 mg Oral BID  . cefTRIAXone (ROCEPHIN)  IV  2 g Intravenous Q24H  . cholecalciferol  1,000 Units Oral QPM  . docusate sodium  100 mg Oral BID  . dolutegravir  50 mg Oral Daily  . emtricitabine-tenofovir AF  1 tablet Oral QHS  . enalapril  20 mg Oral Daily  . enoxaparin (LOVENOX) injection  30 mg Subcutaneous Q24H  . feeding supplement (ENSURE ENLIVE)  237 mL Oral BID BM  . hydrochlorothiazide  25 mg Oral Daily  . HYDROcodone-acetaminophen  0.5 tablet Oral Q6H  . insulin aspart  0-5 Units Subcutaneous QHS  . insulin aspart  0-9 Units Subcutaneous TID WC  . potassium chloride  10 mEq Oral Daily  . senna  1 tablet Oral BID  . tamsulosin  0.4 mg Oral BID  . vancomycin  750 mg Intravenous Q12H  . vitamin C  500 mg Oral QPM    SUBJECTIVE: He is feeling better with less pain. He says that he only started developing pain about 3 days before admission. He has never had any prior episodes like this.  Review of Systems: Review of Systems  Constitutional: Positive for fever. Negative for chills, weight loss and diaphoresis.  Musculoskeletal: Positive for joint pain.    Past Medical History  Diagnosis Date  . Hypertension   . HIV infection ([place])   . Substance abuse   . 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 Allergic rhinitis, cause unspecified 07/21/2011  . Cervical spondylosis  07/21/2011  . Lumbar spondylosis 07/21/2011  . Osteoarthritis 07/21/2011  . COPD (chronic obstructive pulmonary disease) ([place]) 07/21/2011  . 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 Hx of radiation therapy 12/17/11 - 02/13/12    prostate  . Prostate cancer ([place]) 09/19/11    gleason 7  . H/O blood clots   . Pneumonia   . GERD (gastroesophageal reflux disease)   . Hepatitis     Hep C  . Rectal bleeding   . Wears dentures     top  . Wears glasses   . Septic joint of left shoulder region [facility]) 06/15/2015    Social History  Substance Use Topics  . Smoking status: Former Smoker -- 1.00 packs/day for 10 years    Types: Cigarettes    Quit date: 10/13/1960  . Smokeless tobacco: Never Used  . Alcohol Use: No    Family History  Problem Relation Age of Onset  . Alcohol abuse Other   . Arthritis Other   . Hypertension Other   . Colon cancer Neg Hx   . Diabetes Neg Hx   . Cancer Mother     unsure of type   No Known Allergies  OBJECTIVE: Filed Vitals:   06/15/15  1749 06/15/15 2140 06/16/15 0142 06/16/15 0651  BP: 120/89 123/79 113/79 129/85  Pulse: 80 86 90 86  Temp: 99.3 F (37.4 C) 102 F (38.9 C) 99.1 F (37.3 C) 98.9 F (37.2 C)  TempSrc: Oral Oral Oral Oral  Resp: 18 20  16   Height:      Weight:      SpO2: 98% 99% 98% 99%   Body mass index is 25.55 kg/(m^2).  Physical Exam  Constitutional:  He is alert and comfortable sitting up in a chair watching television.  Musculoskeletal:  He has a bulky dressing over his left shoulder. His operative drain is in place.    Lab Results Lab Results  Component Value Date   WBC 8.1 06/16/2015   HGB 11.2* 06/16/2015   HCT 32.6* 06/16/2015   MCV 86.5 06/16/2015   PLT 189 06/16/2015    Lab Results  Component Value Date   CREATININE 1.14 06/16/2015   BUN 12 06/16/2015   NA 134* 06/16/2015   K 3.3* 06/16/2015   CL 108 06/16/2015   CO2 19* 06/16/2015    Lab Results  Component Value Date   ALT 11 06/14/2015   AST 22 06/14/2015    ALKPHOS 804* 06/14/2015   BILITOT 0.4 06/14/2015     Microbiology: Recent Results (from the past 240 hour(s))  Urine culture     Status: None   Collection Time: 06/14/15 11:13 AM  Result Value Ref Range Status   Specimen Description URINE, CLEAN CATCH  Final   Special Requests NONE  Final   Culture   Final    40,000 COLONIES/ml STAPHYLOCOCCUS SPECIES (COAGULASE NEGATIVE) 1,000 COLONIES/mL GRAM NEGATIVE RODS    Report Status 06/16/2015 FINAL  Final   Organism ID, Bacteria STAPHYLOCOCCUS SPECIES (COAGULASE NEGATIVE)  Final      Susceptibility   Staphylococcus species (coagulase negative) - MIC*    CIPROFLOXACIN <=0.5 SENSITIVE Sensitive     GENTAMICIN <=0.5 SENSITIVE Sensitive     NITROFURANTOIN 32 SENSITIVE Sensitive     OXACILLIN <=0.25 SENSITIVE Sensitive     TETRACYCLINE <=1 SENSITIVE Sensitive     VANCOMYCIN 1 SENSITIVE Sensitive     TRIMETH/SULFA <=10 SENSITIVE Sensitive     CLINDAMYCIN <=0.25 SENSITIVE Sensitive     RIFAMPIN <=0.5 SENSITIVE Sensitive     Inducible Clindamycin NEGATIVE Sensitive     * 40,000 COLONIES/ml STAPHYLOCOCCUS SPECIES (COAGULASE NEGATIVE)  Blood culture (routine x 2)     Status: None (Preliminary result)   Collection Time: 06/14/15 11:19 AM  Result Value Ref Range Status   Specimen Description BLOOD RIGHT ANTECUBITAL  Final   Special Requests BOTTLES DRAWN AEROBIC AND ANAEROBIC 5CC  Final   Culture NO GROWTH < 24 HOURS  Final   Report Status PENDING  Incomplete  Gram stain     Status: None   Collection Time: 06/15/15 12:51 PM  Result Value Ref Range Status   Specimen Description SYNOVIAL LEFT SHOULDER  Final   Special Requests PATIENT ON FOLLOWING ROCEPHIN  Final   Gram Stain   Final    ABUNDANT WBC PRESENT,BOTH PMN AND MONONUCLEAR NO ORGANISMS SEEN    Report Status 06/15/2015 FINAL  Final  Body fluid culture     Status: None (Preliminary result)   Collection Time: 06/15/15 12:51 PM  Result Value Ref Range Status   Specimen Description  SYNOVIAL LEFT SHOULDER  Final   Special Requests PATIENT ON FOLLOWING ROCEPHIN  Final   Gram Stain   Final    ABUNDANT  WBC PRESENT,BOTH PMN AND MONONUCLEAR NO ORGANISMS SEEN    Culture NO GROWTH < 24 HOURS  Final   Report Status PENDING  Incomplete   Synovial fluid white blood cell count 37,250 with a predominance of neutrophils Operative Gram stain shows no organisms Synovial fluid is positive for calcium pyrophosphate crystals  ASSESSMENT: He appears to have 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 This does not preclude the possibility of simultaneous septic arthritis. I will continue current antibiotics pending final cultures.  PLAN: 1. Continue current antibiotics pending final cultures  Michel Bickers, MD [facility] for Infectious [place] [phone] pager   [phone] cell 06/16/2015, 12:56 PM

## 2015-06-16 NOTE — Progress Notes (Signed)
PT Cancellation Note  Patient Details Name: Cresencio Cochren. MRN: [id number] DOB: Jun 10, 1940   Cancelled Treatment:    Reason Eval/Treat Not Completed: PT screened, no needs identified, will sign off. Per OT pt at modified independent level with mobility.   Kaylen Nghiem 06/16/2015, 11:21 AM Suanne Marker PT [phone]

## 2015-06-16 NOTE — Progress Notes (Signed)
Patient ID: Billy Brink., male   DOB: 10/17/1940, 75 y.o.   MRN: [id number]     Subjective:  Patient reports pain as mild to moderate.  Patient sitting up in the chair and in no acute distress  Objective:   VITALS:   Filed Vitals:   06/15/15 1749 06/15/15 2140 06/16/15 0142 06/16/15 0651  BP: 120/89 123/79 113/79 129/85  Pulse: 80 86 90 86  Temp: 99.3 F (37.4 C) 102 F (38.9 C) 99.1 F (37.3 C) 98.9 F (37.2 C)  TempSrc: Oral Oral Oral Oral  Resp: 18 20  16   Height:      Weight:      SpO2: 98% 99% 98% 99%    ABD soft Sensation intact distally Dorsiflexion/Plantar flexion intact Incision: moderate drainage hemovac drain in place and active  Lab Results  Component Value Date   WBC 8.1 06/16/2015   HGB 11.2* 06/16/2015   HCT 32.6* 06/16/2015   MCV 86.5 06/16/2015   PLT 189 06/16/2015   BMET    Component Value Date/Time   NA 134* 06/16/2015 0619   K 3.3* 06/16/2015 0619   CL 108 06/16/2015 0619   CO2 19* 06/16/2015 0619   GLUCOSE 161* 06/16/2015 0619   BUN 12 06/16/2015 0619   CREATININE 1.14 06/16/2015 0619   CREATININE 1.24* 06/14/2015 1019   CALCIUM 8.3* 06/16/2015 0619   GFRNONAA >60 06/16/2015 0619   GFRNONAA 58* 06/14/2015 1019   GFRAA >60 06/16/2015 0619   GFRAA 67 06/14/2015 1019     Assessment/Plan: 1 Day Post-Op   Principal Problem:   Septic joint of left shoulder region [facility]) Active Problems:   Human immunodeficiency virus (HIV) disease ([place])   Chronic hepatitis C without hepatic coma (HCC)   Essential hypertension   Substance abuse   Osteoarthritis   COPD (chronic obstructive pulmonary disease) (HCC)   Chronic renal insufficiency, stage II (mild)   Left shoulder pain   Fever   Advance diet Up with therapy DC'd hemovac drain Plan for Dressing change tomorrow Continue plan per medicine Check cultures tomorrow no growth yet Pseudo Gout   DOUGLAS Shinita Mac 06/16/2015, 1:37 PM   Marchia Bond, MD Cell [phone]

## 2015-06-16 NOTE — Care Management Note (Signed)
Case Management Note  Patient Details  Name: Billy Lopez. MRN: [id number] Date of Birth: 03/23/1941  Subjective/Objective:  Patient is from home, has septic joint of left shoulder, has hemovac, on iv abx, s/p i and d  Yesterday await pt eval.  NCM will cont to follow for dc needs.                  Action/Plan:   Expected Discharge Date:                  Expected Discharge Plan:  [place]  In-House Referral:     Discharge planning Services  CM Consult  Post Acute Care Choice:    Choice offered to:     DME Arranged:    DME Agency:     HH Arranged:    [place] Agency:     Status of Service:  In process, will continue to follow  Medicare Important Message Given:    Date Medicare IM Given:    Medicare IM give by:    Date Additional Medicare IM Given:    Additional Medicare Important Message give by:     If discussed at [place] of Stay Meetings, dates discussed:    Additional Comments:  Zenon Mayo, RN 06/16/2015, 8:27 PM

## 2015-06-17 DIAGNOSIS — M11212 Other chondrocalcinosis, left shoulder: Secondary | ICD-10-CM

## 2015-06-17 DIAGNOSIS — M1120 Other chondrocalcinosis, unspecified site: Secondary | ICD-10-CM | POA: Insufficient documentation

## 2015-06-17 DIAGNOSIS — N289 Disorder of kidney and ureter, unspecified: Secondary | ICD-10-CM

## 2015-06-17 LAB — BASIC METABOLIC PANEL
ANION GAP: 5 (ref 5–15)
BUN: 23 mg/dL — AB (ref 6–20)
CALCIUM: 8.7 mg/dL — AB (ref 8.9–10.3)
CO2: 20 mmol/L — ABNORMAL LOW (ref 22–32)
Chloride: 112 mmol/L — ABNORMAL HIGH (ref 101–111)
Creatinine, Ser: 1.36 mg/dL — ABNORMAL HIGH (ref 0.61–1.24)
GFR calc Af Amer: 58 mL/min — ABNORMAL LOW (ref >60)
GFR, EST NON AFRICAN AMERICAN: 50 mL/min — AB (ref >60)
Glucose, Bld: 116 mg/dL — ABNORMAL HIGH (ref 65–99)
POTASSIUM: 4.1 mmol/L (ref 3.5–5.1)
SODIUM: 137 mmol/L (ref 135–145)

## 2015-06-17 LAB — HEMOGLOBIN A1C
Hgb A1c MFr Bld: 6.2 % — ABNORMAL HIGH (ref 4.8–5.6)
MEAN PLASMA GLUCOSE: 131 mg/dL

## 2015-06-17 LAB — GLUCOSE, CAPILLARY
GLUCOSE-CAPILLARY: 107 mg/dL — AB (ref 65–99)
Glucose-Capillary: 91 mg/dL (ref 65–99)
Glucose-Capillary: 102 mg/dL — ABNORMAL HIGH (ref 65–99)
Glucose-Capillary: 120 mg/dL — ABNORMAL HIGH (ref 65–99)

## 2015-06-17 NOTE — Progress Notes (Signed)
PATIENT DETAILS Name: Billy Lopez. Age: 75 y.o. Sex: male Date of Birth: 09-Aug-1940 Admit Date: 06/14/2015 Admitting Physician Waldemar Dickens, MD [id number] Jenny Reichmann, MD  Subjective: Febrile overnight. Pain much better-looks more comfortable.  Assessment/Plan: Principal Problem: Presumed left shoulder septic arthritis:  Admitted with left shoulder swelling/pain along with fever. Underwent incision and drainage of left shoulder on 06/15/15. Blood and Synovial fluid cultures negative so far, however positive  for calcium pyrophosphate crystals-indicating pseudogout. Reviewed  Operative note- there was some muscle necrosis -hence could have a concomitant infection as well. Febrile overnight-continue antibiotics.Await further input from infectious disease and orthopedics. Will continue to follow closely  Active Problems: HIV: Last CD4 count on 05/16/15 360, continue antiretrovirals.  Acute on chronic kidney disease stage II:   creatinine mildly increased today-discontinue HCTZ-Follow.  Hypokalemia: Repleted-follow  ?[id number] A1C, follow CBG's on SSI  Hypertension: Controlled, continue enalapril-given soft BP and mild increase in creatinine-discontinue HCTZ. Follow   BPH: Continue Flomax  History of prostate cancer: Continue Outpatient follow-up with urology  COPD: Continue as needed bronchodilators. Lungs are clear-up is stable for now  Disposition: Remain inpatient  Antimicrobial agents  See below  Anti-infectives    Start     Dose/Rate Route Frequency Ordered Stop   06/15/15 2200  emtricitabine-tenofovir AF (DESCOVY) 200-25 MG per tablet 1 tablet  Status:  Discontinued     1 tablet Oral Daily at bedtime 06/14/15 2032 06/14/15 2215   06/15/15 1845  vancomycin (VANCOCIN) IVPB 750 mg/150 ml premix     750 mg 150 mL/hr over 60 Minutes Intravenous Every 12 hours 06/15/15 1830     06/15/15 1000  dolutegravir (TIVICAY) tablet 50 mg     50 mg Oral Daily 06/14/15  2032     06/15/15 0300  vancomycin (VANCOCIN) IVPB 750 mg/150 ml premix  Status:  Discontinued     750 mg 150 mL/hr over 60 Minutes Intravenous Every 12 hours 06/14/15 1352 06/14/15 2232   06/14/15 2245  cefTRIAXone (ROCEPHIN) 2 g in dextrose 5 % 50 mL IVPB     2 g 100 mL/hr over 30 Minutes Intravenous Every 24 hours 06/14/15 2232     06/14/15 2230  emtricitabine-tenofovir AF (DESCOVY) 200-25 MG per tablet 1 tablet     1 tablet Oral Daily at bedtime 06/14/15 2215     06/14/15 1400  ceFEPIme (MAXIPIME) 2 g in dextrose 5 % 50 mL IVPB  Status:  Discontinued     2 g 100 mL/hr over 30 Minutes Intravenous Every 12 hours 06/14/15 1351 06/14/15 2232   06/14/15 1400  vancomycin (VANCOCIN) 1,500 mg in sodium chloride 0.9 % 500 mL IVPB     1,500 mg 250 mL/hr over 120 Minutes Intravenous  Once 06/14/15 1351 06/14/15 1711      DVT Prophylaxis: Prophylactic Lovenox   Code Status: Full code   Family Communication None at bedside  Procedures: None  CONSULTS:  orthopedic surgery  Time spent 25 minutes-Greater than 50% of this time was spent in counseling, explanation of diagnosis, planning of further management, and coordination of care.  MEDICATIONS: Scheduled Meds: . aspirin EC  325 mg Oral BID  . cefTRIAXone (ROCEPHIN)  IV  2 g Intravenous Q24H  . cholecalciferol  1,000 Units Oral QPM  . docusate sodium  100 mg Oral BID  . dolutegravir  50 mg Oral Daily  . emtricitabine-tenofovir AF  1 tablet Oral QHS  .  enalapril  20 mg Oral Daily  . enoxaparin (LOVENOX) injection  40 mg Subcutaneous Q24H  . feeding supplement (ENSURE ENLIVE)  237 mL Oral BID BM  . hydrochlorothiazide  25 mg Oral Daily  . HYDROcodone-acetaminophen  0.5 tablet Oral Q6H  . insulin aspart  0-5 Units Subcutaneous QHS  . insulin aspart  0-9 Units Subcutaneous TID WC  . senna  1 tablet Oral BID  . tamsulosin  0.4 mg Oral BID  . vancomycin  750 mg Intravenous Q12H  . vitamin C  500 mg Oral QPM   Continuous  Infusions: . lactated ringers 10 mL/hr at 06/15/15 1249   PRN Meds:.acetaminophen, alum & mag hydroxide-simeth, bisacodyl, diphenhydrAMINE, magnesium citrate, menthol-cetylpyridinium **OR** phenol, methocarbamol **OR** methocarbamol (ROBAXIN)  IV, metoCLOPramide **OR** metoCLOPramide (REGLAN) injection, morphine injection, ondansetron **OR** ondansetron (ZOFRAN) IV, oxyCODONE, polyethylene glycol, traZODone    PHYSICAL EXAM: Vital signs in last 24 hours: Filed Vitals:   06/16/15 2123 06/17/15 0605 06/17/15 0955 06/17/15 1400  BP: 96/65 88/56 100/62 96/52  Pulse: 53 92  108  Temp: 101.8 F (38.8 C) 99 F (37.2 C)  98.8 F (37.1 C)  TempSrc: Oral Oral  Oral  Resp: 19 18  20   Height:      Weight:      SpO2: 96% 99%  91%    Weight change:  Filed Weights   06/14/15 1029  Weight: 76.204 kg (168 lb)   Body mass index is 25.55 kg/(m^2).   Gen Exam: Awake and alert with clear speech.  Neck: Supple, No JVD.   Chest: B/L Clear anteriorly CVS: S1 S2 Regular, no murmurs.  Abdomen: soft, BS +, non tender, non distended.  Extremities: no edema, lower extremities warm to touch. Left shoulder bandaged-did not open Neurologic: Non Focal.   Skin: No Rash.   Wounds: N/A.   Intake/Output from previous day:  Intake/Output Summary (Last 24 hours) at 06/17/15 1430 Last data filed at 06/17/15 1426  Gross per 24 hour  Intake    920 ml  Output    200 ml  Net    720 ml     LAB RESULTS: CBC  Recent Labs Lab 06/14/15 1055 06/15/15 0525 06/16/15 0619  WBC 7.9 8.1 8.1  HGB 12.1* 11.3* 11.2*  HCT 36.8* 33.8* 32.6*  PLT 201 184 189  MCV 88.9 88.5 86.5  MCH 29.2 29.6 29.7  MCHC 32.9 33.4 34.4  RDW 13.4 13.4 13.3  LYMPHSABS 1.1  --   --   MONOABS 0.9  --   --   EOSABS 0.0  --   --   BASOSABS 0.0  --   --     Chemistries   Recent Labs Lab 06/14/15 1019 06/14/15 1055 06/15/15 0525 06/16/15 0619 06/17/15 0540  NA 134* 134* 136 134* 137  K 3.8 3.5 3.6 3.3* 4.1  CL 103 104  107 108 112*  CO2 21 21* 21* 19* 20*  GLUCOSE 129* 141* 136* 161* 116*  BUN 26* 22* 19 12 23*  CREATININE 1.24* 1.33* 1.18 1.14 1.36*  CALCIUM 8.9 9.2 8.6* 8.3* 8.7*    CBG:  Recent Labs Lab 06/16/15 1227 06/16/15 1834 06/16/15 2207 06/17/15 0756 06/17/15 1139  GLUCAP 152* 146* 133* 107* 91    GFR Estimated Creatinine Clearance: 47.5 mL/min (by C-G formula based on Cr of 1.36).  Coagulation profile No results for input(s): INR, PROTIME in the last 168 hours.  Cardiac Enzymes No results for input(s): CKMB, TROPONINI, MYOGLOBIN in the last 168  hours.  Invalid input(s): CK  Invalid input(s): POCBNP No results for input(s): DDIMER in the last 72 hours.  Recent Labs  06/16/15 1640  HGBA1C 6.2*   No results for input(s): CHOL, HDL, LDLCALC, TRIG, CHOLHDL, LDLDIRECT in the last 72 hours. No results for input(s): TSH, T4TOTAL, T3FREE, THYROIDAB in the last 72 hours.  Invalid input(s): FREET3 No results for input(s): VITAMINB12, FOLATE, FERRITIN, TIBC, IRON, RETICCTPCT in the last 72 hours. No results for input(s): LIPASE, AMYLASE in the last 72 hours.  Urine Studies No results for input(s): UHGB, CRYS in the last 72 hours.  Invalid input(s): UACOL, UAPR, USPG, UPH, UTP, UGL, UKET, UBIL, UNIT, UROB, ULEU, UEPI, UWBC, URBC, UBAC, CAST, UCOM, BILUA  MICROBIOLOGY: Recent Results (from the past 240 hour(s))  Urine culture     Status: None   Collection Time: 06/14/15 11:13 AM  Result Value Ref Range Status   Specimen Description URINE, CLEAN CATCH  Final   Special Requests NONE  Final   Culture   Final    40,000 COLONIES/ml STAPHYLOCOCCUS SPECIES (COAGULASE NEGATIVE) 1,000 COLONIES/mL GRAM NEGATIVE RODS    Report Status 06/16/2015 FINAL  Final   Organism ID, Bacteria STAPHYLOCOCCUS SPECIES (COAGULASE NEGATIVE)  Final      Susceptibility   Staphylococcus species (coagulase negative) - MIC*    CIPROFLOXACIN <=0.5 SENSITIVE Sensitive     GENTAMICIN <=0.5 SENSITIVE  Sensitive     NITROFURANTOIN 32 SENSITIVE Sensitive     OXACILLIN <=0.25 SENSITIVE Sensitive     TETRACYCLINE <=1 SENSITIVE Sensitive     VANCOMYCIN 1 SENSITIVE Sensitive     TRIMETH/SULFA <=10 SENSITIVE Sensitive     CLINDAMYCIN <=0.25 SENSITIVE Sensitive     RIFAMPIN <=0.5 SENSITIVE Sensitive     Inducible Clindamycin NEGATIVE Sensitive     * 40,000 COLONIES/ml STAPHYLOCOCCUS SPECIES (COAGULASE NEGATIVE)  Blood culture (routine x 2)     Status: None (Preliminary result)   Collection Time: 06/14/15 11:19 AM  Result Value Ref Range Status   Specimen Description BLOOD RIGHT ANTECUBITAL  Final   Special Requests BOTTLES DRAWN AEROBIC AND ANAEROBIC 5CC  Final   Culture NO GROWTH 3 DAYS  Final   Report Status PENDING  Incomplete  Blood culture (routine x 2)     Status: None (Preliminary result)   Collection Time: 06/14/15  9:25 PM  Result Value Ref Range Status   Specimen Description BLOOD LEFT FOREARM  Final   Special Requests BOTTLES DRAWN AEROBIC AND ANAEROBIC 10CC  Final   Culture NO GROWTH 3 DAYS  Final   Report Status PENDING  Incomplete  Gram stain     Status: None   Collection Time: 06/15/15 12:51 PM  Result Value Ref Range Status   Specimen Description SYNOVIAL LEFT SHOULDER  Final   Special Requests PATIENT ON FOLLOWING ROCEPHIN  Final   Gram Stain   Final    ABUNDANT WBC PRESENT,BOTH PMN AND MONONUCLEAR NO ORGANISMS SEEN    Report Status 06/15/2015 FINAL  Final  Body fluid culture     Status: None (Preliminary result)   Collection Time: 06/15/15 12:51 PM  Result Value Ref Range Status   Specimen Description SYNOVIAL LEFT SHOULDER  Final   Special Requests PATIENT ON FOLLOWING ROCEPHIN  Final   Gram Stain   Final    ABUNDANT WBC PRESENT,BOTH PMN AND MONONUCLEAR NO ORGANISMS SEEN    Culture NO GROWTH 2 DAYS  Final   Report Status PENDING  Incomplete    RADIOLOGY STUDIES/RESULTS: Dg Chest  2 View  06/14/2015  CLINICAL DATA:  Fever 102.1 degrees earlier this  morning, weakness, hypertension, COPD, HIV, former smoker EXAM: CHEST  2 VIEW COMPARISON:  10/11/2014 FINDINGS: Normal heart size and pulmonary vascularity. Elongation of thoracic aorta. Minimal bibasilar atelectasis. Lungs otherwise clear. No pleural effusion or pneumothorax. Probable BILATERAL chronic rotator cuff tears and glenohumeral degenerative changes. Scattered endplate spur formation thoracic spine. IMPRESSION: Minimal bibasilar atelectasis. Electronically Signed   By: Lavonia Dana M.D.   On: 06/14/2015 15:39   Mr Shoulder Left Wo Contrast  06/14/2015  CLINICAL DATA:  Shoulder pain and fever. EXAM: MRI OF THE LEFT SHOULDER WITHOUT CONTRAST TECHNIQUE: Multiplanar, multisequence MR imaging of the shoulder was performed. No intravenous contrast was administered. COMPARISON:  Radiographs 06/14/2015 FINDINGS: Examination is quite limited. Only 3 sequences were completed. Patient refused further imaging. All of the sequences are degraded by motion artifact. Rotator cuff:  Large full-thickness retracted rotator cuff tear. Muscles:  Severe fatty atrophy of the rotator cuff muscles. Biceps long head:  Likely torn and retracted. Acromioclavicular Joint: AC joint degenerative changes. Possible prior acromioplasty changes. Glenohumeral Joint: Advanced glenohumeral joint degenerative changes with joint space narrowing, osteophytic spurring and subchondral cystic change. Large joint effusion and moderate synovitis. Labrum:  Degenerated and likely torn. Bones: There is a large and fairly extensive bone infarct involving the humeral head neck. I do not see any obvious destructive bony changes to suggest osteomyelitis or septic arthritis. Numerous loose bodies are noted in the glenohumeral joint and also in the bursa. IMPRESSION: 1. Very limited examination as discussed above. 2. Large full-thickness complete retracted rotator cuff tear. 3. Likely torn and retracted long head biceps tendon. 4. Advanced glenohumeral joint  degenerative changes and moderate AC joint degenerative changes. Suspect previous surgery with probable prior acromioplasty. 5. Large amount of joint fluid and fluid in the bursa but I do not see any definite findings that would suggest this is septic arthritis or osteomyelitis. Large loose bodies are noted in the joint and in the bursa. 6. 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 Electronically Signed   By: Marijo Sanes M.D.   On: 06/14/2015 18:43   Dg Shoulder Left  06/14/2015  CLINICAL DATA:  Left shoulder pain and decreased range of motion, no known injury, initial encounter EXAM: LEFT SHOULDER - 2+ VIEW COMPARISON:  None. FINDINGS: Significant degenerative changes of the left shoulder joint are noted. The humeral head is high-riding against the acromion suggestive of chronic rotator cuff injury. No acute fracture or dislocation is seen. IMPRESSION: Chronic degenerative changes as described. Electronically Signed   By: Inez Catalina M.D.   On: 06/14/2015 11:30    Oren Binet, MD  [facility] Pager:[phone]  If 7PM-7AM, please contact night-coverage [email or web address] Password [email or web address] 06/17/2015, 2:30 PM   LOS: 2 days

## 2015-06-17 NOTE — Progress Notes (Signed)
Patient ID: Billy Lopez., male   DOB: 18-Dec-1940, 75 y.o.   MRN: [id number]         [facility] for Infectious Disease    Date of Admission:  06/14/2015           Day 3 vancomycin        Day 3 ceftriaxone  Principal Problem:   Septic joint of left shoulder region [facility]) Active Problems:   Human immunodeficiency virus (HIV) disease (HCC)   Chronic hepatitis C without hepatic coma (HCC)   Essential hypertension   Substance abuse   Osteoarthritis   COPD (chronic obstructive pulmonary disease) (HCC)   Chronic renal insufficiency, stage II (mild)   Left shoulder pain   Fever   . aspirin EC  325 mg Oral BID  . cefTRIAXone (ROCEPHIN)  IV  2 g Intravenous Q24H  . cholecalciferol  1,000 Units Oral QPM  . docusate sodium  100 mg Oral BID  . dolutegravir  50 mg Oral Daily  . emtricitabine-tenofovir AF  1 tablet Oral QHS  . enalapril  20 mg Oral Daily  . enoxaparin (LOVENOX) injection  40 mg Subcutaneous Q24H  . feeding supplement (ENSURE ENLIVE)  237 mL Oral BID BM  . HYDROcodone-acetaminophen  0.5 tablet Oral Q6H  . insulin aspart  0-5 Units Subcutaneous QHS  . insulin aspart  0-9 Units Subcutaneous TID WC  . senna  1 tablet Oral BID  . tamsulosin  0.4 mg Oral BID  . vancomycin  750 mg Intravenous Q12H  . vitamin C  500 mg Oral QPM    SUBJECTIVE: He is feeling better with less pain.   Review of Systems: Review of Systems  Constitutional: Positive for fever. Negative for chills, weight loss and diaphoresis.  Musculoskeletal: Positive for joint pain.    Past Medical History  Diagnosis Date  . Hypertension   . HIV infection ([place])   . Substance abuse   . 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 Allergic rhinitis, cause unspecified 07/21/2011  . Cervical spondylosis 07/21/2011  . Lumbar spondylosis 07/21/2011  . Osteoarthritis 07/21/2011  . COPD (chronic obstructive pulmonary disease) ([place]) 07/21/2011  . 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 Hx of radiation therapy 12/17/11  - 02/13/12    prostate  . Prostate cancer ([place]) 09/19/11    gleason 7  . H/O blood clots   . Pneumonia   . GERD (gastroesophageal reflux disease)   . Hepatitis     Hep C  . Rectal bleeding   . Wears dentures     top  . Wears glasses   . Septic joint of left shoulder region [facility]) 06/15/2015    Social History  Substance Use Topics  . Smoking status: Former Smoker -- 1.00 packs/day for 10 years    Types: Cigarettes    Quit date: 10/13/1960  . Smokeless tobacco: Never Used  . Alcohol Use: No    Family History  Problem Relation Age of Onset  . Alcohol abuse Other   . Arthritis Other   . Hypertension Other   . Colon cancer Neg Hx   . Diabetes Neg Hx   . Cancer Mother     unsure of type   No Known Allergies  OBJECTIVE: Filed Vitals:   06/16/15 2123 06/17/15 0605 06/17/15 0955 06/17/15 1400  BP: 96/65 88/56 100/62 96/52  Pulse: 53 92  108  Temp: 101.8 F (38.8 C) 99 F (37.2 C)  98.8 F (37.1 C)  TempSrc: Oral Oral  Oral  Resp: 19 18  20   Height:      Weight:      SpO2: 96% 99%  91%   Body mass index is 25.55 kg/(m^2).  Physical Exam  Constitutional:  He appears more comfortable.  Musculoskeletal:  He has a bulky dressing over his left shoulder. His operative drain is in place.    Lab Results Lab Results  Component Value Date   WBC 8.1 06/16/2015   HGB 11.2* 06/16/2015   HCT 32.6* 06/16/2015   MCV 86.5 06/16/2015   PLT 189 06/16/2015    Lab Results  Component Value Date   CREATININE 1.36* 06/17/2015   BUN 23* 06/17/2015   NA 137 06/17/2015   K 4.1 06/17/2015   CL 112* 06/17/2015   CO2 20* 06/17/2015    Lab Results  Component Value Date   ALT 11 06/14/2015   AST 22 06/14/2015   ALKPHOS 804* 06/14/2015   BILITOT 0.4 06/14/2015     Microbiology: Recent Results (from the past 240 hour(s))  Urine culture     Status: None   Collection Time: 06/14/15 11:13 AM  Result Value Ref Range Status   Specimen Description URINE, CLEAN CATCH  Final    Special Requests NONE  Final   Culture   Final    40,000 COLONIES/ml STAPHYLOCOCCUS SPECIES (COAGULASE NEGATIVE) 1,000 COLONIES/mL GRAM NEGATIVE RODS    Report Status 06/16/2015 FINAL  Final   Organism ID, Bacteria STAPHYLOCOCCUS SPECIES (COAGULASE NEGATIVE)  Final      Susceptibility   Staphylococcus species (coagulase negative) - MIC*    CIPROFLOXACIN <=0.5 SENSITIVE Sensitive     GENTAMICIN <=0.5 SENSITIVE Sensitive     NITROFURANTOIN 32 SENSITIVE Sensitive     OXACILLIN <=0.25 SENSITIVE Sensitive     TETRACYCLINE <=1 SENSITIVE Sensitive     VANCOMYCIN 1 SENSITIVE Sensitive     TRIMETH/SULFA <=10 SENSITIVE Sensitive     CLINDAMYCIN <=0.25 SENSITIVE Sensitive     RIFAMPIN <=0.5 SENSITIVE Sensitive     Inducible Clindamycin NEGATIVE Sensitive     * 40,000 COLONIES/ml STAPHYLOCOCCUS SPECIES (COAGULASE NEGATIVE)  Blood culture (routine x 2)     Status: None (Preliminary result)   Collection Time: 06/14/15 11:19 AM  Result Value Ref Range Status   Specimen Description BLOOD RIGHT ANTECUBITAL  Final   Special Requests BOTTLES DRAWN AEROBIC AND ANAEROBIC 5CC  Final   Culture NO GROWTH 3 DAYS  Final   Report Status PENDING  Incomplete  Blood culture (routine x 2)     Status: None (Preliminary result)   Collection Time: 06/14/15  9:25 PM  Result Value Ref Range Status   Specimen Description BLOOD LEFT FOREARM  Final   Special Requests BOTTLES DRAWN AEROBIC AND ANAEROBIC 10CC  Final   Culture NO GROWTH 3 DAYS  Final   Report Status PENDING  Incomplete  Gram stain     Status: None   Collection Time: 06/15/15 12:51 PM  Result Value Ref Range Status   Specimen Description SYNOVIAL LEFT SHOULDER  Final   Special Requests PATIENT ON FOLLOWING ROCEPHIN  Final   Gram Stain   Final    ABUNDANT WBC PRESENT,BOTH PMN AND MONONUCLEAR NO ORGANISMS SEEN    Report Status 06/15/2015 FINAL  Final  Body fluid culture     Status: None (Preliminary result)   Collection Time: 06/15/15 12:51 PM    Result Value Ref Range Status   Specimen Description SYNOVIAL LEFT SHOULDER  Final   Special Requests PATIENT ON  FOLLOWING ROCEPHIN  Final   Gram Stain   Final    ABUNDANT WBC PRESENT,BOTH PMN AND MONONUCLEAR NO ORGANISMS SEEN    Culture NO GROWTH 2 DAYS  Final   Report Status PENDING  Incomplete   Synovial fluid white blood cell count 37,250 with a predominance of neutrophils Operative Gram stain shows no organisms; cultures are negative to date Synovial fluid is positive for calcium pyrophosphate crystals  ASSESSMENT: His left shoulder cultures remain negative at 48 hours. I suspect that this may be severe, acute pseudogout. I would suggest treating him for pseudogout. He has mild renal insufficiency but probably would tolerate short-term NSAIDs. His cultures remain negative tomorrow we could choose between stopping all antibiotics versus giving him a 10-14 day course of oral levofloxacin just in case he also has culture negative septic arthritis. I will follow-up tomorrow.  PLAN: 1. Continue current antibiotics pending final cultures 2. Recommend trial of NSAIDs (or alternative anti-inflammatory)  Michel Bickers, MD [facility] for [place] [phone] pager   [phone] cell 06/17/2015, 5:47 PM

## 2015-06-18 DIAGNOSIS — M1180 Other specified crystal arthropathies, unspecified site: Secondary | ICD-10-CM

## 2015-06-18 LAB — GLUCOSE, CAPILLARY
GLUCOSE-CAPILLARY: 164 mg/dL — AB (ref 65–99)
Glucose-Capillary: 107 mg/dL — ABNORMAL HIGH (ref 65–99)
Glucose-Capillary: 123 mg/dL — ABNORMAL HIGH (ref 65–99)
Glucose-Capillary: 200 mg/dL — ABNORMAL HIGH (ref 65–99)

## 2015-06-18 LAB — BODY FLUID CULTURE: Culture: NO GROWTH

## 2015-06-18 LAB — VANCOMYCIN, TROUGH: Vancomycin Tr: 19 ug/mL (ref 10.0–20.0)

## 2015-06-18 MED ORDER — PREDNISONE 50 MG PO TABS
60.0000 mg | ORAL_TABLET | Freq: Every day | ORAL | Status: DC
  Administered 2015-06-18 – 2015-06-19 (×2): 60 mg via ORAL
  Filled 2015-06-18 (×3): qty 1

## 2015-06-18 NOTE — Progress Notes (Signed)
Patient ID: Billy Lopez., male   DOB: 10/07/1940, 75 y.o.   MRN: [id number]         [facility] for Infectious Disease   Total days of antibiotics 4   Operative cultures from his left shoulder remain negative. I suspect that his severe inflammatory arthritis at due to a combination of chronic degenerative joint problems and superimposed acute pseudogout. I will stop antibiotics at this time. He has been started on prednisone. I will sign off now.         Michel Bickers, MD [facility] for [facility] [place] [facility] [phone] pager   [phone] cell 06/06/2015, 1:32 PM

## 2015-06-18 NOTE — Progress Notes (Signed)
Occupational Therapy Treatment Patient Details Name: Billy Lopez. MRN: [id number] DOB: 04-Oct-1940 Today's Date: 06/18/2015    History of present illness 75 yo male here for follow up of HIV and renal insufficiency. He also has complaints of acute left shoulder pain and swelling since 06/13/15. Patient is s/p L ARTHROSCOPIC SHOULDER IRRIGATION AND DEBRIDEMENT   OT comments  Pt seen for ADL and ROM LUE. Pt using LUE during ADL without c/o pain. Recommended pt complete table slides with LUE as tolerated. Pt verbalized understanding. Pt will be safe to D/C home with PCA assisting when medically stable. Pt will follow up with Dr. [name] for further therapy needs on outpt basis if needed. Will continue to follow acutely. Pt needs written HEP for LUE A/AAROM and also needs to practice donning/doffing sling.   Follow Up Recommendations  Other (comment) (outpt therapy as indicated by Dr. [name])    Equipment Recommendations  None recommended by OT    Recommendations for Other Services      Precautions / Restrictions Precautions Precautions: Shoulder;Fall Type of Shoulder Precautions: AROM elbow wrist hand. per [name] - no shoulder restriction Required Braces or Orthoses: Sling (for comfort and sleep) -per order Restrictions LUE Weight Bearing: Non weight bearing  - pt not maintaining NWB status      Mobility Bed Mobility Overal bed mobility: Modified Independent                Transfers Overall transfer level: Modified independent                    Balance Overall balance assessment: No apparent balance deficits (not formally assessed)                                 ADL Overall ADL's : Needs assistance/impaired     Grooming: Set up;Sitting   Upper Body Bathing: Set up;Supervision/ safety;Sitting   Lower Body Bathing: Set up;Supervison/ safety;Sit to/from stand   Upper Body Dressing : Minimal assistance;Sitting   Lower Body Dressing: Set  up;Sit to/from stand   Toilet Transfer: Supervision/safety;Ambulation (cane)   Toileting- Clothing Manipulation and Hygiene: Modified independent;Sit to/from stand       Functional mobility during ADLs: Supervision/safety;Cane (due to IV) General ADL Comments: Using LUE as tolerated for ADL wihout c/o pain      Vision                     Perception     Praxis      Cognition   Behavior During Therapy: Impulsive  - most likely baseline Overall Cognitive Status: No family/caregiver present to determine baseline cognitive functioning (most likely at baseline)                       Extremity/Trunk Assessment     decreased c/o pain. Using functionally for ADL without c/o pain. Limitations noted in RUE consistent with RTC insufficieny.          Exercises Shoulder Exercises Shoulder Flexion: AAROM;Left (performed within pain tolerance for ADL tasks) Elbow Flexion: AROM;Left;10 reps;Standing Elbow Extension: AROM;Left;10 reps;Standing Wrist Flexion: AROM;Left;10 reps;Standing Wrist Extension: AROM;Left;10 reps;Standing Digit Composite Flexion: AROM;Left;10 reps;Standing Composite Extension: AROM;Left;10 reps;Standing Donning/doffing shirt without moving shoulder: Minimal assistance Method for sponge bathing under operated UE: Supervision/safety;Set-up Donning/doffing sling/immobilizer: Moderate assistance ROM for elbow, wrist and digits of operated UE: Supervision/safety Sling wearing schedule (on at all  times/off for ADL's): Supervision/safety Proper positioning of operated UE when showering: Supervision/safety Positioning of UE while sleeping: Supervision/safety   Shoulder Instructions Shoulder Instructions Donning/doffing shirt without moving shoulder: Minimal assistance Method for sponge bathing under operated UE: Supervision/safety;Set-up Donning/doffing sling/immobilizer: Moderate assistance ROM for elbow, wrist and digits of operated UE:  Supervision/safety Sling wearing schedule (on at all times/off for ADL's): Supervision/safety Proper positioning of operated UE when showering: Supervision/safety Positioning of UE while sleeping: Supervision/safety     General Comments  Pt very appreciative of help.     Pertinent Vitals/ Pain       Pain Assessment: No/denies pain  Home Living                                          Prior Functioning/Environment              Frequency Min 3X/week     Progress Toward Goals  OT Goals(current goals can now be found in the care plan section)  Progress towards OT goals: Progressing toward goals  Acute Rehab OT Goals Patient Stated Goal: go home soon OT Goal Formulation: With patient Time For Goal Achievement: 06/30/15 Potential to Achieve Goals: Good ADL Goals Pt Will Perform Grooming: with modified independence;standing Pt Will Perform Lower Body Bathing: with modified independence;sit to/from stand;with adaptive equipment Pt Will Perform Lower Body Dressing: with modified independence;sit to/from stand;with adaptive equipment Pt Will Transfer to Toilet: with modified independence;ambulating;bedside commode Pt/caregiver will Perform Home Exercise Program: Increased ROM;Increased strength;With Supervision;With written HEP provided;Left upper extremity Additional ADL Goal #1: Pt will indepenently don/doff sling  Plan Discharge plan remains appropriate    Co-evaluation                 End of Session     Activity Tolerance Patient tolerated treatment well   Patient Left in chair;with call bell/phone within reach;with chair alarm set   Nurse Communication Mobility status;Weight bearing status;Precautions        Time: [id number] OT Time Calculation (min): 25 min  Charges: OT General Charges $OT Visit: 1 Procedure OT Treatments $Self Care/Home Management : 23-37 mins  Shantel Wesely,HILLARY 06/18/2015, 9:55 AM   Maurie Boettcher, OTR/L   [phone] 06/18/2015

## 2015-06-18 NOTE — Progress Notes (Signed)
Nursing Note: Pt had lab attempts earlier and lab was unable to get stick. Lab returned to get stick and pt refused. Asked them to wait and try again at lunch time.wbb

## 2015-06-18 NOTE — Progress Notes (Signed)
PATIENT DETAILS Name: Riddik Thierry. Age: 75 y.o. Sex: male Date of Birth: 05/27/1941 Admit Date: 06/14/2015 Admitting Physician Waldemar Dickens, MD [id number] Jenny Reichmann, MD  Subjective: Afebrile, pain controlled.   Assessment/Plan: Principal Problem: Presumed left shoulder septic arthritis:  Admitted with left shoulder swelling/pain along with fever. Underwent incision and drainage of left shoulder on 06/15/15. Blood and Synovial fluid cultures negative so far, howeversynovial fluid positive for calcium pyrophosphate crystals-indicating pseudogout. Reviewed Operative note- there was some muscle necrosis -hence could have a concomitant infection as well. Since cultures continue to be negative, will start steroids for a few days, await ID/orthopedic evaluation regarding antibiotics.  Active Problems: HIV: Last CD4 count on 05/16/15 360, continue antiretrovirals.  Acute on chronic kidney disease stage II:   creatinine mildly increased today-discontinue HCTZ-Follow.  Hypokalemia: Repleted-follow  ?[id number] A1C, follow CBG's on SSI  Hypertension: Controlled, continue enalapril-given soft BP and mild increase in creatinine-discontinue HCTZ. Follow   BPH: Continue Flomax  History of prostate cancer: Continue Outpatient follow-up with urology  COPD: Continue as needed bronchodilators. Lungs are clear-up is stable for now  Disposition: Remain inpatient  Antimicrobial agents  See below  Anti-infectives    Start     Dose/Rate Route Frequency Ordered Stop   06/15/15 2200  emtricitabine-tenofovir AF (DESCOVY) 200-25 MG per tablet 1 tablet  Status:  Discontinued     1 tablet Oral Daily at bedtime 06/14/15 2032 06/14/15 2215   06/15/15 1845  vancomycin (VANCOCIN) IVPB 750 mg/150 ml premix     750 mg 150 mL/hr over 60 Minutes Intravenous Every 12 hours 06/15/15 1830     06/15/15 1000  dolutegravir (TIVICAY) tablet 50 mg     50 mg Oral Daily 06/14/15 2032     06/15/15  0300  vancomycin (VANCOCIN) IVPB 750 mg/150 ml premix  Status:  Discontinued     750 mg 150 mL/hr over 60 Minutes Intravenous Every 12 hours 06/14/15 1352 06/14/15 2232   06/14/15 2245  cefTRIAXone (ROCEPHIN) 2 g in dextrose 5 % 50 mL IVPB     2 g 100 mL/hr over 30 Minutes Intravenous Every 24 hours 06/14/15 2232     06/14/15 2230  emtricitabine-tenofovir AF (DESCOVY) 200-25 MG per tablet 1 tablet     1 tablet Oral Daily at bedtime 06/14/15 2215     06/14/15 1400  ceFEPIme (MAXIPIME) 2 g in dextrose 5 % 50 mL IVPB  Status:  Discontinued     2 g 100 mL/hr over 30 Minutes Intravenous Every 12 hours 06/14/15 1351 06/14/15 2232   06/14/15 1400  vancomycin (VANCOCIN) 1,500 mg in sodium chloride 0.9 % 500 mL IVPB     1,500 mg 250 mL/hr over 120 Minutes Intravenous  Once 06/14/15 1351 06/14/15 1711      DVT Prophylaxis: Prophylactic Lovenox   Code Status: Full code   Family Communication None at bedside  Procedures: None  CONSULTS:  orthopedic surgery  Time spent 25 minutes-Greater than 50% of this time was spent in counseling, explanation of diagnosis, planning of further management, and coordination of care.  MEDICATIONS: Scheduled Meds: . aspirin EC  325 mg Oral BID  . cefTRIAXone (ROCEPHIN)  IV  2 g Intravenous Q24H  . cholecalciferol  1,000 Units Oral QPM  . docusate sodium  100 mg Oral BID  . dolutegravir  50 mg Oral Daily  . emtricitabine-tenofovir AF  1 tablet Oral QHS  .  enalapril  20 mg Oral Daily  . enoxaparin (LOVENOX) injection  40 mg Subcutaneous Q24H  . feeding supplement (ENSURE ENLIVE)  237 mL Oral BID BM  . HYDROcodone-acetaminophen  0.5 tablet Oral Q6H  . insulin aspart  0-5 Units Subcutaneous QHS  . insulin aspart  0-9 Units Subcutaneous TID WC  . predniSONE  60 mg Oral Q breakfast  . senna  1 tablet Oral BID  . tamsulosin  0.4 mg Oral BID  . vancomycin  750 mg Intravenous Q12H  . vitamin C  500 mg Oral QPM   Continuous Infusions: . lactated  ringers 10 mL/hr at 06/17/15 1835   PRN Meds:.acetaminophen, alum & mag hydroxide-simeth, bisacodyl, diphenhydrAMINE, magnesium citrate, menthol-cetylpyridinium **OR** phenol, methocarbamol **OR** methocarbamol (ROBAXIN)  IV, metoCLOPramide **OR** metoCLOPramide (REGLAN) injection, morphine injection, ondansetron **OR** ondansetron (ZOFRAN) IV, oxyCODONE, polyethylene glycol, traZODone    PHYSICAL EXAM: Vital signs in last 24 hours: Filed Vitals:   06/17/15 2202 06/18/15 0632 06/18/15 0941 06/18/15 1234  BP: 95/63 100/62 102/64 100/64  Pulse: 76 76  82  Temp: 99.5 F (37.5 C) 97.8 F (36.6 C)  98.1 F (36.7 C)  TempSrc: Oral Oral  Oral  Resp: 16 16  18   Height:      Weight:      SpO2: 100% 98%  99%    Weight change:  Filed Weights   06/14/15 1029  Weight: 76.204 kg (168 lb)   Body mass index is 25.55 kg/(m^2).   Gen Exam: Awake and alert with clear speech.  Neck: Supple, No JVD.   Chest: B/L Clear anteriorly CVS: S1 S2 Regular, no murmurs.  Abdomen: soft, BS +, non tender, non distended.  Extremities: no edema, lower extremities warm to touch. Left shoulder bandaged-did not open Neurologic: Non Focal.   Skin: No Rash.   Wounds: N/A.   Intake/Output from previous day:  Intake/Output Summary (Last 24 hours) at 06/18/15 1337 Last data filed at 06/18/15 1234  Gross per 24 hour  Intake    560 ml  Output   1275 ml  Net   -715 ml     LAB RESULTS: CBC  Recent Labs Lab 06/14/15 1055 06/15/15 0525 06/16/15 0619  WBC 7.9 8.1 8.1  HGB 12.1* 11.3* 11.2*  HCT 36.8* 33.8* 32.6*  PLT 201 184 189  MCV 88.9 88.5 86.5  MCH 29.2 29.6 29.7  MCHC 32.9 33.4 34.4  RDW 13.4 13.4 13.3  LYMPHSABS 1.1  --   --   MONOABS 0.9  --   --   EOSABS 0.0  --   --   BASOSABS 0.0  --   --     Chemistries   Recent Labs Lab 06/14/15 1019 06/14/15 1055 06/15/15 0525 06/16/15 0619 06/17/15 0540  NA 134* 134* 136 134* 137  K 3.8 3.5 3.6 3.3* 4.1  CL 103 104 107 108 112*  CO2  21 21* 21* 19* 20*  GLUCOSE 129* 141* 136* 161* 116*  BUN 26* 22* 19 12 23*  CREATININE 1.24* 1.33* 1.18 1.14 1.36*  CALCIUM 8.9 9.2 8.6* 8.3* 8.7*    CBG:  Recent Labs Lab 06/17/15 1139 06/17/15 1711 06/17/15 2200 06/18/15 0803 06/18/15 1158  GLUCAP 91 102* 120* 107* 123*    GFR Estimated Creatinine Clearance: 47.5 mL/min (by C-G formula based on Cr of 1.36).  Coagulation profile No results for input(s): INR, PROTIME in the last 168 hours.  Cardiac Enzymes No results for input(s): CKMB, TROPONINI, MYOGLOBIN in the last 168 hours.  Invalid input(s): CK  Invalid input(s): POCBNP No results for input(s): DDIMER in the last 72 hours.  Recent Labs  06/16/15 1640  HGBA1C 6.2*   No results for input(s): CHOL, HDL, LDLCALC, TRIG, CHOLHDL, LDLDIRECT in the last 72 hours. No results for input(s): TSH, T4TOTAL, T3FREE, THYROIDAB in the last 72 hours.  Invalid input(s): FREET3 No results for input(s): VITAMINB12, FOLATE, FERRITIN, TIBC, IRON, RETICCTPCT in the last 72 hours. No results for input(s): LIPASE, AMYLASE in the last 72 hours.  Urine Studies No results for input(s): UHGB, CRYS in the last 72 hours.  Invalid input(s): UACOL, UAPR, USPG, UPH, UTP, UGL, UKET, UBIL, UNIT, UROB, ULEU, UEPI, UWBC, URBC, UBAC, CAST, UCOM, BILUA  MICROBIOLOGY: Recent Results (from the past 240 hour(s))  Urine culture     Status: None   Collection Time: 06/14/15 11:13 AM  Result Value Ref Range Status   Specimen Description URINE, CLEAN CATCH  Final   Special Requests NONE  Final   Culture   Final    40,000 COLONIES/ml STAPHYLOCOCCUS SPECIES (COAGULASE NEGATIVE) 1,000 COLONIES/mL GRAM NEGATIVE RODS    Report Status 06/16/2015 FINAL  Final   Organism ID, Bacteria STAPHYLOCOCCUS SPECIES (COAGULASE NEGATIVE)  Final      Susceptibility   Staphylococcus species (coagulase negative) - MIC*    CIPROFLOXACIN <=0.5 SENSITIVE Sensitive     GENTAMICIN <=0.5 SENSITIVE Sensitive      NITROFURANTOIN 32 SENSITIVE Sensitive     OXACILLIN <=0.25 SENSITIVE Sensitive     TETRACYCLINE <=1 SENSITIVE Sensitive     VANCOMYCIN 1 SENSITIVE Sensitive     TRIMETH/SULFA <=10 SENSITIVE Sensitive     CLINDAMYCIN <=0.25 SENSITIVE Sensitive     RIFAMPIN <=0.5 SENSITIVE Sensitive     Inducible Clindamycin NEGATIVE Sensitive     * 40,000 COLONIES/ml STAPHYLOCOCCUS SPECIES (COAGULASE NEGATIVE)  Blood culture (routine x 2)     Status: None (Preliminary result)   Collection Time: 06/14/15 11:19 AM  Result Value Ref Range Status   Specimen Description BLOOD RIGHT ANTECUBITAL  Final   Special Requests BOTTLES DRAWN AEROBIC AND ANAEROBIC 5CC  Final   Culture NO GROWTH 3 DAYS  Final   Report Status PENDING  Incomplete  Blood culture (routine x 2)     Status: None (Preliminary result)   Collection Time: 06/14/15  9:25 PM  Result Value Ref Range Status   Specimen Description BLOOD LEFT FOREARM  Final   Special Requests BOTTLES DRAWN AEROBIC AND ANAEROBIC 10CC  Final   Culture NO GROWTH 3 DAYS  Final   Report Status PENDING  Incomplete  Gram stain     Status: None   Collection Time: 06/15/15 12:51 PM  Result Value Ref Range Status   Specimen Description SYNOVIAL LEFT SHOULDER  Final   Special Requests PATIENT ON FOLLOWING ROCEPHIN  Final   Gram Stain   Final    ABUNDANT WBC PRESENT,BOTH PMN AND MONONUCLEAR NO ORGANISMS SEEN    Report Status 06/15/2015 FINAL  Final  Body fluid culture     Status: None   Collection Time: 06/15/15 12:51 PM  Result Value Ref Range Status   Specimen Description SYNOVIAL LEFT SHOULDER  Final   Special Requests PATIENT ON FOLLOWING ROCEPHIN  Final   Gram Stain   Final    ABUNDANT WBC PRESENT,BOTH PMN AND MONONUCLEAR NO ORGANISMS SEEN    Culture NO GROWTH 3 DAYS  Final   Report Status 06/18/2015 FINAL  Final    RADIOLOGY STUDIES/RESULTS: Dg Chest 2 View  06/14/2015  CLINICAL DATA:  Fever 102.1 degrees earlier this morning, weakness, hypertension, COPD,  HIV, former smoker EXAM: CHEST  2 VIEW COMPARISON:  10/11/2014 FINDINGS: Normal heart size and pulmonary vascularity. Elongation of thoracic aorta. Minimal bibasilar atelectasis. Lungs otherwise clear. No pleural effusion or pneumothorax. Probable BILATERAL chronic rotator cuff tears and glenohumeral degenerative changes. Scattered endplate spur formation thoracic spine. IMPRESSION: Minimal bibasilar atelectasis. Electronically Signed   By: Lavonia Dana M.D.   On: 06/14/2015 15:39   Mr Shoulder Left Wo Contrast  06/14/2015  CLINICAL DATA:  Shoulder pain and fever. EXAM: MRI OF THE LEFT SHOULDER WITHOUT CONTRAST TECHNIQUE: Multiplanar, multisequence MR imaging of the shoulder was performed. No intravenous contrast was administered. COMPARISON:  Radiographs 06/14/2015 FINDINGS: Examination is quite limited. Only 3 sequences were completed. Patient refused further imaging. All of the sequences are degraded by motion artifact. Rotator cuff:  Large full-thickness retracted rotator cuff tear. Muscles:  Severe fatty atrophy of the rotator cuff muscles. Biceps long head:  Likely torn and retracted. Acromioclavicular Joint: AC joint degenerative changes. Possible prior acromioplasty changes. Glenohumeral Joint: Advanced glenohumeral joint degenerative changes with joint space narrowing, osteophytic spurring and subchondral cystic change. Large joint effusion and moderate synovitis. Labrum:  Degenerated and likely torn. Bones: There is a large and fairly extensive bone infarct involving the humeral head neck. I do not see any obvious destructive bony changes to suggest osteomyelitis or septic arthritis. Numerous loose bodies are noted in the glenohumeral joint and also in the bursa. IMPRESSION: 1. Very limited examination as discussed above. 2. Large full-thickness complete retracted rotator cuff tear. 3. Likely torn and retracted long head biceps tendon. 4. Advanced glenohumeral joint degenerative changes and moderate AC  joint degenerative changes. Suspect previous surgery with probable prior acromioplasty. 5. Large amount of joint fluid and fluid in the bursa but I do not see any definite findings that would suggest this is septic arthritis or osteomyelitis. Large loose bodies are noted in the joint and in the bursa. 6. 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 Electronically Signed   By: Marijo Sanes M.D.   On: 06/14/2015 18:43   Dg Shoulder Left  06/14/2015  CLINICAL DATA:  Left shoulder pain and decreased range of motion, no known injury, initial encounter EXAM: LEFT SHOULDER - 2+ VIEW COMPARISON:  None. FINDINGS: Significant degenerative changes of the left shoulder joint are noted. The humeral head is high-riding against the acromion suggestive of chronic rotator cuff injury. No acute fracture or dislocation is seen. IMPRESSION: Chronic degenerative changes as described. Electronically Signed   By: Inez Catalina M.D.   On: 06/14/2015 11:30    Oren Binet, MD  [facility] Pager:[phone]  If 7PM-7AM, please contact night-coverage [email or web address] Password [email or web address] 06/18/2015, 1:37 PM   LOS: 3 days

## 2015-06-18 NOTE — Progress Notes (Signed)
Pharmacy Antibiotic Follow-up Note  Billy Cowdin. is a 75 y.o. year-old male admitted on 06/14/2015.  The patient is currently on day 5 of vancomycin/rocephin for septic arthritis.  Assessment/Plan: 1/15 Vanc Trough - early drawn at 0547 (dose due at 0800) - 19  - Continue Vancomycin 750 mg IV Q 12 hrs - Continue Rocephin 2 g IV q24 hrs - Monitor renal function and f/u cultures - Repeat Vancomycin trough at steady sate if indicated.   Temp (24hrs), Avg:98.7 F (37.1 C), Min:97.8 F (36.6 C), Max:99.5 F (37.5 C)   Recent Labs Lab 06/14/15 1055 06/15/15 0525 06/16/15 0619  WBC 7.9 8.1 8.1    Recent Labs Lab 06/14/15 1019 06/14/15 1055 06/15/15 0525 06/16/15 0619 06/17/15 0540  CREATININE 1.24* 1.33* 1.18 1.14 1.36*   Estimated Creatinine Clearance: 47.5 mL/min (by C-G formula based on Cr of 1.36).    No Known Allergies  Antimicrobials this admission: Vancomycin 1/11 x 1, 1/12 >> 1/15 Vanc Trough - early drawn at 0547 (dose due at 0800) - 19  Rocephin 1/11 >> Cefepime 1/11 x 2 doses Dolutegravir + Descovy continued for HIV   Levels/dose changes this admission: 1/15 Vanc Trough - early drawn at 0547 (dose due at 0800) - 19  Microbiology results: 1/11 Urine - CoNS methicillin sens 1/11 blood x 2: NGTD 1/12 synovial fluid: NGTD   Thank you for allowing pharmacy to be a part of this patient's care.  [place] PharmD 06/18/2015 7:17 AM

## 2015-06-19 LAB — CULTURE, BLOOD (ROUTINE X 2)
CULTURE: NO GROWTH
CULTURE: NO GROWTH

## 2015-06-19 LAB — BASIC METABOLIC PANEL
Anion gap: 7 (ref 5–15)
BUN: 28 mg/dL — ABNORMAL HIGH (ref 6–20)
CALCIUM: 8.7 mg/dL — AB (ref 8.9–10.3)
CO2: 17 mmol/L — AB (ref 22–32)
CREATININE: 1.32 mg/dL — AB (ref 0.61–1.24)
Chloride: 113 mmol/L — ABNORMAL HIGH (ref 101–111)
GFR, EST NON AFRICAN AMERICAN: 52 mL/min — AB (ref >60)
GLUCOSE: 104 mg/dL — AB (ref 65–99)
Potassium: 4 mmol/L (ref 3.5–5.1)
Sodium: 137 mmol/L (ref 135–145)

## 2015-06-19 LAB — GLUCOSE, CAPILLARY: Glucose-Capillary: 116 mg/dL — ABNORMAL HIGH (ref 65–99)

## 2015-06-19 MED ORDER — PREDNISONE 10 MG PO TABS: ORAL_TABLET | ORAL | Status: DC

## 2015-06-19 MED ORDER — HYDROCODONE-ACETAMINOPHEN 10-325 MG PO TABS: ORAL_TABLET | ORAL | Status: DC

## 2015-06-19 NOTE — Care Management Important Message (Signed)
Important Message  Patient Details  Name: Billy Lopez. MRN: [id number] Date of Birth: 30-Jul-1940   Medicare Important Message Given:  Yes    Loann Quill 06/19/2015, 12:48 PM

## 2015-06-19 NOTE — Progress Notes (Signed)
NURSING PROGRESS NOTE  Leyton Halter [id number] Discharge Data: 06/19/2015 12:17 PM Attending Provider: Jonetta Osgood, MD [id number] Jenny Reichmann, MD     [name] to be D/C'd Home per MD order.  Discussed with the patient the After Visit Summary and all questions fully answered. All IV's discontinued with no bleeding noted. All belongings returned to patient for patient to take home.   Last Vital Signs:  Blood pressure 119/80, pulse 82, temperature 98.4 F (36.9 C), temperature source Oral, resp. rate 16, height 5\' 8"  (1.727 m), weight 76.204 kg (168 lb), SpO2 100 %.  Discharge Medication List   Medication List    STOP taking these medications        hydrochlorothiazide 25 MG tablet  Commonly known as:  HYDRODIURIL     ondansetron 4 MG tablet  Commonly known as:  ZOFRAN     traMADol 50 MG tablet  Commonly known as:  ULTRAM      TAKE these medications        acetaminophen 325 MG tablet  Commonly known as:  TYLENOL  Take 2 tablets (650 mg total) by mouth every 6 (six) hours as needed for mild pain (or Fever >/= 101).     aspirin 325 MG EC tablet  Take 1 tablet (325 mg total) by mouth 2 (two) times daily.     bisacodyl 5 MG EC tablet  Commonly known as:  DULCOLAX  Take 1 tablet (5 mg total) by mouth daily as needed for moderate constipation.     cholecalciferol 1000 units tablet  Commonly known as:  VITAMIN D  Take 1,000 Units by mouth every evening.     dolutegravir 50 MG tablet  Commonly known as:  TIVICAY  Take 1 tablet (50 mg total) by mouth daily.     emtricitabine-tenofovir AF 200-25 MG tablet  Commonly known as:  DESCOVY  Take 1 tablet by mouth at bedtime.     enalapril 20 MG tablet  Commonly known as:  VASOTEC  Take 1 tablet (20 mg total) by mouth daily.     feeding supplement (ENSURE COMPLETE) Liqd  Take 237 mLs by mouth 2 (two) times daily between meals.     furosemide 20 MG tablet  Commonly known as:  LASIX  Take 1 tablet (20 mg total)  by mouth daily.     HYDROcodone-acetaminophen 10-325 MG tablet  Commonly known as:  NORCO  take 1/2 to 1 tablet by mouth every 8 hours if needed     potassium chloride 10 MEQ tablet  Commonly known as:  K-DUR  Take 1 tablet (10 mEq total) by mouth daily.     predniSONE 10 MG tablet  Commonly known as:  DELTASONE  Take 4 tablets (40 mg) daily for 2 days, then, Take 3 tablets (30 mg) daily for 2 days, then, Take 2 tablets (20 mg) daily for 2 days, then, Take 1 tablets (10 mg) daily for 1 days, then stop     tamsulosin 0.4 MG Caps capsule  Commonly known as:  FLOMAX  take 1 capsule by mouth twice a day     vitamin A 10000 UNIT capsule  Take 10,000 Units by mouth every evening.     vitamin C 500 MG tablet  Commonly known as:  ASCORBIC ACID  Take 500 mg by mouth every evening.         Doristine Devoid, RN

## 2015-06-19 NOTE — Progress Notes (Addendum)
Occupational Therapy Treatment Patient Details Name: Billy Lopez. MRN: [id number] DOB: 23-Jun-1940 Today's Date: 06/19/2015    History of present illness 75 y.o. male here for follow up of HIV and renal insufficiency. He also has complaints of acute left shoulder pain and swelling since 06/13/15. Patient is s/p L ARTHROSCOPIC SHOULDER IRRIGATION AND DEBRIDEMENT   OT comments  Pt progressing and may leave today. Education provided in session.  Follow Up Recommendations  Other (comment) (Outpatient OT once MD feels appropriate)    Equipment Recommendations  None recommended by OT    Recommendations for Other Services      Precautions / Restrictions Precautions Precautions: Shoulder;Fall Type of Shoulder Precautions: AROM elbow wrist hand. per Billy Lopez - no shoulder restriction Shoulder Interventions: Shoulder sling/immobilizer;For comfort (and sleep) Precaution Booklet Issued: No Required Braces or Orthoses: Sling (for comfort and sleep) Restrictions Weight Bearing Restrictions: Yes LUE Weight Bearing: Non weight bearing Other Position/Activity Restrictions: cues given in session       Mobility Bed Mobility Overal bed mobility: Needs Assistance Bed Mobility: Supine to Sit;Sit to Supine     Supine to sit: Supervision Sit to supine: Supervision      Transfers Overall transfer level: Needs assistance   Transfers: Sit to/from Stand Sit to Stand: Supervision              Balance    Used cane for ambulation most of session.                               ADL       Grooming: Wash/dry hands;Supervision/safety;Standing               Lower Body Dressing: Minimal assistance (standing and sitting) Lower Body Dressing Details (indicate cue type and reason): able to reach down to feet when sitting in chair to see if he could access feet for perform LB dressing-encouraged pt to use LUE; assisted in tying pt's gown in back Toilet Transfer:  Supervision/safety;Ambulation;Regular Toilet (cane)   Toileting- Clothing Manipulation and Hygiene: Sit to/from stand Toileting - Clothing Manipulation Details (indicate cue type and reason): assisted in tying pt's gown prior to him using bathroom and adjusted pt's gown so IV was in correct position prior to him using bathroom     Functional mobility during ADLs: Supervision/safety;Cane-also walked short distance without cane General ADL Comments: Discussed UB clothing that may be easier to manage. Assisted/educated on sling. recommended him not pulling sockaid with LUE. Encouraged pt to be using LUE.      Vision                     Perception     Praxis      Cognition  Awake/Alert Behavior During Therapy: Impulsive Overall Cognitive Status: No family/caregiver present to determine baseline cognitive functioning                       Extremity/Trunk Assessment               Exercises Other Exercises Other Exercises: Pt performed wall climbs with LUE and also table slides (handouts provided for these two exercises). Other Exercises: Educated on LUE shoulder internal and external rotation and pt performed using cane sitting in chair Other Exercises: Pt performed AAROM Lt shoulder flexion in supine position. Donning/doffing shirt without moving shoulder:  (discussed technique and clothing) Donning/doffing sling/immobilizer: Minimal assistance Correct positioning of  sling/immobilizer: Moderate assistance ROM for elbow, wrist and digits of operated UE: Supervision/safety Sling wearing schedule (on at all times/off for ADL's):  (educated for comfort and sleep)   Shoulder Instructions Shoulder Instructions Donning/doffing shirt without moving shoulder:  (discussed technique and clothing) Donning/doffing sling/immobilizer: Minimal assistance Correct positioning of sling/immobilizer: Moderate assistance ROM for elbow, wrist and digits of operated UE:  Supervision/safety Sling wearing schedule (on at all times/off for ADL's):  (educated for comfort and sleep)     General Comments      Pertinent Vitals/ Pain       Pain Assessment: 0-10 Pain Score:  (8-9) Pain Location: Lt shoulder  Home Living                                          Prior Functioning/Environment              Frequency Min 3X/week     Progress Toward Goals  OT Goals(current goals can now be found in the care plan section)  Progress towards OT goals: Progressing toward goals  Acute Rehab OT Goals Patient Stated Goal: not stated OT Goal Formulation: With patient Time For Goal Achievement: 06/30/15 Potential to Achieve Goals: Good ADL Goals Pt Will Perform Grooming: with modified independence;standing Pt Will Perform Lower Body Bathing: with modified independence;sit to/from stand;with adaptive equipment Pt Will Perform Lower Body Dressing: with modified independence;sit to/from stand;with adaptive equipment Pt Will Transfer to Toilet: with modified independence;ambulating;bedside commode Pt/caregiver will Perform Home Exercise Program: Increased ROM;Increased strength;With Supervision;With written HEP provided;Left upper extremity Additional ADL Goal #1: Pt will indepenently don/doff sling  Plan Discharge plan remains appropriate    Co-evaluation                 End of Session Equipment Utilized During Treatment: Other (comment) (sling and cane)   Activity Tolerance Patient tolerated treatment well   Patient Left in chair;with call bell/phone within reach;with chair alarm set   Nurse Communication          Time: [id number] (approximately 3-4 minutes spent with pt in bathroom having BM) OT Time Calculation (min): 21 min  Charges: OT General Charges $OT Visit: 1 Procedure OT Treatments $Self Care/Home Management : 8-22 mins  Billy Lopez OTR/L [id number] 06/19/2015, 1:27 PM

## 2015-06-19 NOTE — Discharge Summary (Signed)
PATIENT DETAILS Name: Billy Lopez. Age: 75 y.o. Sex: male Date of Birth: [birth year] MRN: [id number]. Admitting Physician: Waldemar Dickens, MD [id number] Jenny Reichmann, MD  Admit Date: 06/14/2015 Discharge date: 06/19/2015  Recommendations for Outpatient Follow-up:  1. Check CBC and chemistries in one week 2. Ensure follow-up with orthopedics-Dr. [name] 3. Ensure follow-up with infectious disease 4. Follow up blood and synovial fluid cultures until final   PRIMARY DISCHARGE DIAGNOSIS:  Principal Problem:   Pseudogout of left shoulder region 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 Hypertension   . HIV infection ([place])   . Substance abuse   . 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 Allergic rhinitis, cause unspecified 07/21/2011  . Cervical spondylosis 07/21/2011  . Lumbar spondylosis 07/21/2011  . Osteoarthritis 07/21/2011  . COPD (chronic obstructive pulmonary disease) ([place]) 07/21/2011  . 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 Hx of radiation therapy 12/17/11 - 02/13/12    prostate  . Prostate cancer ([place]) 09/19/11    gleason 7  . H/O blood clots   . Pneumonia   . GERD (gastroesophageal reflux disease)   . Hepatitis     Hep C  . Rectal bleeding   . Wears dentures     top  . Wears glasses   . Septic joint of left shoulder region [facility]) 06/15/2015    DISCHARGE MEDICATIONS: Current Discharge Medication List    START taking these medications   Details  predniSONE (DELTASONE) 10 MG tablet Take 4 tablets (40 mg) daily for 2 days, then, Take 3 tablets (30 mg) daily for 2 days, then, Take 2 tablets (20 mg) daily for 2 days,  then, Take 1 tablets (10 mg) daily for 1 days, then stop Qty: 19 tablet, Refills: 0      CONTINUE these medications which have CHANGED   Details  HYDROcodone-acetaminophen (NORCO) 10-325 MG tablet take 1/2 to 1 tablet by mouth every 8 hours if needed Qty: 30 tablet, Refills: 0      CONTINUE these medications which have NOT CHANGED   Details  acetaminophen (TYLENOL) 325 MG tablet Take 2 tablets (650 mg total) by mouth every 6 (six) hours as needed for mild pain (or Fever >/= 101). Qty: 30 tablet, Refills: 0    aspirin EC 325 MG EC tablet Take 1 tablet (325 mg total) by mouth 2 (two) times daily. Qty: 60 tablet, Refills: 0    bisacodyl (DULCOLAX) 5 MG EC tablet Take 1 tablet (5 mg total) by mouth daily as needed for moderate constipation. Qty: 30 tablet, Refills: 0    cholecalciferol (VITAMIN D) 1000 UNITS tablet Take 1,000 Units by mouth every evening.    dolutegravir (TIVICAY) 50 MG tablet Take 1 tablet (50 mg total) by mouth daily. Qty: 30 tablet, Refills: 6    emtricitabine-tenofovir AF (DESCOVY) 200-25 MG tablet Take 1 tablet by mouth at bedtime. Qty: 30 tablet, Refills: 6    enalapril (VASOTEC) 20 MG tablet Take 1 tablet (20 mg total) by mouth daily. Qty: 90 tablet, Refills: 3    feeding supplement, ENSURE COMPLETE, (ENSURE COMPLETE) LIQD Take 237 mLs by  mouth 2 (two) times daily between meals. Qty: 474 mL, Refills: 11    furosemide (LASIX) 20 MG tablet Take 1 tablet (20 mg total) by mouth daily. Qty: 30 tablet, Refills: 11    potassium chloride (K-DUR) 10 MEQ tablet Take 1 tablet (10 mEq total) by mouth daily. Qty: 90 tablet, Refills: 3    tamsulosin (FLOMAX) 0.4 MG CAPS take 1 capsule by mouth twice a day Qty: 60 capsule, Refills: 0    vitamin A 10000 UNIT capsule Take 10,000 Units by mouth every evening.    vitamin C (ASCORBIC ACID) 500 MG tablet Take 500 mg by mouth every evening.      STOP taking these medications     hydrochlorothiazide (HYDRODIURIL) 25  MG tablet      traMADol (ULTRAM) 50 MG tablet      ondansetron (ZOFRAN) 4 MG tablet         ALLERGIES:  No Known Allergies  BRIEF HPI:  See H&P, Labs, Consult and Test reports for all details in brief, patient was admitted for evaluation of left shoulder swelling and fever.   CONSULTATIONS:   ID and orthopedic surgery  PERTINENT RADIOLOGIC STUDIES: Dg Chest 2 View  06/14/2015  CLINICAL DATA:  Fever 102.1 degrees earlier this morning, weakness, hypertension, COPD, HIV, former smoker EXAM: CHEST  2 VIEW COMPARISON:  10/11/2014 FINDINGS: Normal heart size and pulmonary vascularity. Elongation of thoracic aorta. Minimal bibasilar atelectasis. Lungs otherwise clear. No pleural effusion or pneumothorax. Probable BILATERAL chronic rotator cuff tears and glenohumeral degenerative changes. Scattered endplate spur formation thoracic spine. IMPRESSION: Minimal bibasilar atelectasis. Electronically Signed   By: Lavonia Dana M.D.   On: 06/14/2015 15:39   Mr Shoulder Left Wo Contrast  06/14/2015  CLINICAL DATA:  Shoulder pain and fever. EXAM: MRI OF THE LEFT SHOULDER WITHOUT CONTRAST TECHNIQUE: Multiplanar, multisequence MR imaging of the shoulder was performed. No intravenous contrast was administered. COMPARISON:  Radiographs 06/14/2015 FINDINGS: Examination is quite limited. Only 3 sequences were completed. Patient refused further imaging. All of the sequences are degraded by motion artifact. Rotator cuff:  Large full-thickness retracted rotator cuff tear. Muscles:  Severe fatty atrophy of the rotator cuff muscles. Biceps long head:  Likely torn and retracted. Acromioclavicular Joint: AC joint degenerative changes. Possible prior acromioplasty changes. Glenohumeral Joint: Advanced glenohumeral joint degenerative changes with joint space narrowing, osteophytic spurring and subchondral cystic change. Large joint effusion and moderate synovitis. Labrum:  Degenerated and likely torn. Bones: There is a large  and fairly extensive bone infarct involving the humeral head neck. I do not see any obvious destructive bony changes to suggest osteomyelitis or septic arthritis. Numerous loose bodies are noted in the glenohumeral joint and also in the bursa. IMPRESSION: 1. Very limited examination as discussed above. 2. Large full-thickness complete retracted rotator cuff tear. 3. Likely torn and retracted long head biceps tendon. 4. Advanced glenohumeral joint degenerative changes and moderate AC joint degenerative changes. Suspect previous surgery with probable prior acromioplasty. 5. Large amount of joint fluid and fluid in the bursa but I do not see any definite findings that would suggest this is septic arthritis or osteomyelitis. Large loose bodies are noted in the joint and in the bursa. 6. 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 Electronically Signed   By: Marijo Sanes M.D.   On: 06/14/2015 18:43   Dg Shoulder Left  06/14/2015  CLINICAL DATA:  Left shoulder pain and decreased range of motion, no known injury, initial encounter  EXAM: LEFT SHOULDER - 2+ VIEW COMPARISON:  None. FINDINGS: Significant degenerative changes of the left shoulder joint are noted. The humeral head is high-riding against the acromion suggestive of chronic rotator cuff injury. No acute fracture or dislocation is seen. IMPRESSION: Chronic degenerative changes as described. Electronically Signed   By: Inez Catalina M.D.   On: 06/14/2015 11:30     PERTINENT LAB RESULTS: CBC: No results for input(s): WBC, HGB, HCT, PLT in the last 72 hours. CMET CMP     Component Value Date/Time   NA 137 06/19/2015 0650   K 4.0 06/19/2015 0650   CL 113* 06/19/2015 0650   CO2 17* 06/19/2015 0650   GLUCOSE 104* 06/19/2015 0650   BUN 28* 06/19/2015 0650   CREATININE 1.32* 06/19/2015 0650   CREATININE 1.24* 06/14/2015 1019   CALCIUM 8.7* 06/19/2015 0650   PROT 6.6 06/14/2015 1019   ALBUMIN 3.7 06/14/2015 1019   AST  22 06/14/2015 1019   ALT 11 06/14/2015 1019   ALKPHOS 804* 06/14/2015 1019   BILITOT 0.4 06/14/2015 1019   GFRNONAA 52* 06/19/2015 0650   GFRNONAA 58* 06/14/2015 1019   GFRAA >60 06/19/2015 0650   GFRAA 67 06/14/2015 1019    GFR Estimated Creatinine Clearance: 48.9 mL/min (by C-G formula based on Cr of 1.32). No results for input(s): LIPASE, AMYLASE in the last 72 hours. No results for input(s): CKTOTAL, CKMB, CKMBINDEX, TROPONINI in the last 72 hours. Invalid input(s): POCBNP No results for input(s): DDIMER in the last 72 hours.  Recent Labs  06/16/15 1640  HGBA1C 6.2*   No results for input(s): CHOL, HDL, LDLCALC, TRIG, CHOLHDL, LDLDIRECT in the last 72 hours. No results for input(s): TSH, T4TOTAL, T3FREE, THYROIDAB in the last 72 hours.  Invalid input(s): FREET3 No results for input(s): VITAMINB12, FOLATE, FERRITIN, TIBC, IRON, RETICCTPCT in the last 72 hours. Coags: No results for input(s): INR in the last 72 hours.  Invalid input(s): PT Microbiology: Recent Results (from the past 240 hour(s))  Urine culture     Status: None   Collection Time: 06/14/15 11:13 AM  Result Value Ref Range Status   Specimen Description URINE, CLEAN CATCH  Final   Special Requests NONE  Final   Culture   Final    40,000 COLONIES/ml STAPHYLOCOCCUS SPECIES (COAGULASE NEGATIVE) 1,000 COLONIES/mL GRAM NEGATIVE RODS    Report Status 06/16/2015 FINAL  Final   Organism ID, Bacteria STAPHYLOCOCCUS SPECIES (COAGULASE NEGATIVE)  Final      Susceptibility   Staphylococcus species (coagulase negative) - MIC*    CIPROFLOXACIN <=0.5 SENSITIVE Sensitive     GENTAMICIN <=0.5 SENSITIVE Sensitive     NITROFURANTOIN 32 SENSITIVE Sensitive     OXACILLIN <=0.25 SENSITIVE Sensitive     TETRACYCLINE <=1 SENSITIVE Sensitive     VANCOMYCIN 1 SENSITIVE Sensitive     TRIMETH/SULFA <=10 SENSITIVE Sensitive     CLINDAMYCIN <=0.25 SENSITIVE Sensitive     RIFAMPIN <=0.5 SENSITIVE Sensitive     Inducible  Clindamycin NEGATIVE Sensitive     * 40,000 COLONIES/ml STAPHYLOCOCCUS SPECIES (COAGULASE NEGATIVE)  Blood culture (routine x 2)     Status: None (Preliminary result)   Collection Time: 06/14/15 11:19 AM  Result Value Ref Range Status   Specimen Description BLOOD RIGHT ANTECUBITAL  Final   Special Requests BOTTLES DRAWN AEROBIC AND ANAEROBIC 5CC  Final   Culture NO GROWTH 4 DAYS  Final   Report Status PENDING  Incomplete  Blood culture (routine x 2)     Status: None (  Preliminary result)   Collection Time: 06/14/15  9:25 PM  Result Value Ref Range Status   Specimen Description BLOOD LEFT FOREARM  Final   Special Requests BOTTLES DRAWN AEROBIC AND ANAEROBIC 10CC  Final   Culture NO GROWTH 4 DAYS  Final   Report Status PENDING  Incomplete  Gram stain     Status: None   Collection Time: 06/15/15 12:51 PM  Result Value Ref Range Status   Specimen Description SYNOVIAL LEFT SHOULDER  Final   Special Requests PATIENT ON FOLLOWING ROCEPHIN  Final   Gram Stain   Final    ABUNDANT WBC PRESENT,BOTH PMN AND MONONUCLEAR NO ORGANISMS SEEN    Report Status 06/15/2015 FINAL  Final  Body fluid culture     Status: None   Collection Time: 06/15/15 12:51 PM  Result Value Ref Range Status   Specimen Description SYNOVIAL LEFT SHOULDER  Final   Special Requests PATIENT ON FOLLOWING ROCEPHIN  Final   Gram Stain   Final    ABUNDANT WBC PRESENT,BOTH PMN AND MONONUCLEAR NO ORGANISMS SEEN    Culture NO GROWTH 3 DAYS  Final   Report Status 06/18/2015 FINAL  Final     BRIEF HOSPITAL COURSE:  Left shoulder pseudogout flare:  Admitted with left shoulder swelling/pain along with fever-initially thought to have septic arthritis. Underwent incision and drainage of left shoulder on 06/15/15. Blood and Synovial fluid cultures negative so far, however synovial fluid positive for calcium pyrophosphate crystals-indicating pseudogout.Infectious disease and orthopedics followed patient throughout this hospital stay.  Since all cultures are negative, antibiotics were discontinued on 1/15, patient was observed for another 24 hours-he continues to do well without fever. ID does not recommend any further antibiotics. Spoke with Dr. De Nurse 06/19/15-okay for discharge, recommends dry dressing changes daily, and to follow up with him in 1-2 weeks in the office for wound recheck and suture removal. Currently stable for discharge.    Active Problems: HIV: Last CD4 count on 05/16/15 360, continue antiretrovirals. will need ID follow-up as outpatient   Acute on chronic kidney disease stage II: creatinine minimally increased and very close to usual baseline. I have discontinued HCTZ, continue Lasix. Follow electrolytes closely in the outpatient setting.   Hypokalemia: Repleted-follow  Hypertension: Controlled, continue enalapril-HCTZ discontinued. Follow-up in optimizing the outpatient setting   BPH: Continue Flomax  History of prostate cancer: Continue Outpatient follow-up with urology  COPD:  this was stable-clear lungs at the time of discharge.  TODAY-DAY OF DISCHARGE:  Subjective:   Billy Lopez today has no headache,no chest abdominal pain,no new weakness tingling or numbness, feels much better wants to go home today.   Objective:   Blood pressure 119/80, pulse 82, temperature 98.4 F (36.9 C), temperature source Oral, resp. rate 16, height 5\' 8"  (1.727 m), weight 76.204 kg (168 lb), SpO2 100 %.  Intake/Output Summary (Last 24 hours) at 06/19/15 1046 Last data filed at 06/19/15 0848  Gross per 24 hour  Intake    220 ml  Output   1350 ml  Net  -1130 ml   Filed Weights   06/14/15 1029  Weight: 76.204 kg (168 lb)    Exam Awake Alert, Oriented *3, No new F.N deficits, Normal affect [place].AT,PERRAL Supple Neck,No JVD, No cervical lymphadenopathy appriciated.  Symmetrical Chest wall movement, Good air movement bilaterally, CTAB RRR,No Gallops,Rubs or new Murmurs, No Parasternal  Heave +ve B.Sounds, Abd Soft, Non tender, No organomegaly appriciated, No rebound -guarding or rigidity. No Cyanosis, Clubbing or edema, No new Rash  or bruise  DISCHARGE CONDITION: Stable  DISPOSITION: Home  DISCHARGE INSTRUCTIONS:    Activity:  As tolerated   Get Medicines reviewed and adjusted: Please take all your medications with you for your next visit with your Primary MD  Please request your Primary MD to go over all hospital tests and procedure/radiological results at the follow up, please ask your Primary MD to get all Hospital records sent to his/her office.  If you experience worsening of your admission symptoms, develop shortness of breath, life threatening emergency, suicidal or homicidal thoughts you must seek medical attention immediately by calling 911 or calling your MD immediately  if symptoms less severe.  You must read complete instructions/literature along with all the possible adverse reactions/side effects for all the Medicines you take and that have been prescribed to you. Take any new Medicines after you have completely understood and accpet all the possible adverse reactions/side effects.   Do not drive when taking Pain medications.   Do not take more than prescribed Pain, Sleep and Anxiety Medications  Special Instructions: If you have smoked or chewed Tobacco  in the last 2 yrs please stop smoking, stop any regular Alcohol  and or any Recreational drug use.  Wear Seat belts while driving.  Please note  You were cared for by a hospitalist during your hospital stay. Once you are discharged, your primary care physician will handle any further medical issues. Please note that NO REFILLS for any discharge medications will be authorized once you are discharged, as it is imperative that you return to your primary care physician (or establish a relationship with a primary care physician if you do not have one) for your aftercare needs so that they can reassess  your need for medications and monitor your lab values.   Diet recommendation: Heart Healthy diet  Discharge Instructions    Call MD for:  persistant nausea and vomiting    Complete by:  As directed      Call MD for:  redness, tenderness, or signs of infection (pain, swelling, redness, odor or green/yellow discharge around incision site)    Complete by:  As directed      Call MD for:  severe uncontrolled pain    Complete by:  As directed      Call MD for:  temperature >100.4    Complete by:  As directed      Diet - low sodium heart healthy    Complete by:  As directed      Discharge wound care:    Complete by:  As directed   Dry dressing daily     Increase activity slowly    Complete by:  As directed            Follow-up Information    Follow up with Johnny Bridge, MD. Schedule an appointment as soon as possible for a visit in 1 week.   Specialty:  Orthopedic Surgery   Why:  For wound re-check, For suture removal, Hospital follow up   Contact information:   [address] [place] [address] [phone]       Follow up with [name], MD. Schedule an appointment as soon as possible for a visit in 2 weeks.   Specialties:  Internal Medicine, Radiology   Contact information:   [address] [place] [address] [phone]       Follow up with Scharlene Gloss, MD. Schedule an appointment as soon as possible for  a visit in 2 weeks.   Specialty:  Infectious Diseases   Why:  Hospital follow up   Contact information:   [address] [address] [phone]      Total Time spent on discharge equals  45 minutes.  SignedOren Binet 06/19/2015 10:46 AM

## 2015-06-19 NOTE — Progress Notes (Signed)
CSW received consult regarding transportation. Patient reported having no money and no way home.   CSW provided taxi voucher.  Percell Locus Jamie Belger LCSWA [phone]

## 2015-06-19 NOTE — Progress Notes (Signed)
Patient is fall risk and walks with cane and has an arm sling on. I responded to his chair alarm, patient up OOB and refused to sit until we had help availible to help him get dress. He has been discharge and wish to get dressed now. He up and refuses bed alarm even after i explained to him why we needed it.

## 2015-06-19 NOTE — Discharge Instructions (Signed)
Follow with Primary MD  [name], MD  In 1 week  Follow with orthopedic M.D.-Dr. [name] in 1 week-for post hospital discharge visit, wound check and suture removal.  Dressing instructions: Dry dressing to wound daily.  Make sure your primary M.D. checks CBC and chemistries in one week  Get Medicines reviewed and adjusted. Please take all your medications with you for your next visit with your Primary MD  Please request your Primary MD to go over all hospital tests and procedure/radiological results at the follow up, please ask your Primary MD to get all Hospital records sent to his/her office.  If you experience worsening of your admission symptoms, develop shortness of breath, life threatening emergency, suicidal or homicidal thoughts you must seek medical attention immediately by calling 911 or calling your MD immediately  if symptoms less severe.  You must read complete instructions/literature along with all the possible adverse reactions/side effects for all the Medicines you take and that have been prescribed to you. Take any new Medicines after you have completely understood and accpet all the possible adverse reactions/side effects.   Do not drive when taking Pain medications or sleeping medications (Benzodaizepines)  Do not take more than prescribed Pain, Sleep and Anxiety Medications  Special Instructions: If you have smoked or chewed Tobacco  in the last 2 yrs please stop smoking, stop any regular Alcohol  and or any Recreational drug use.  Wear Seat belts while driving.  Please note  You were cared for by a hospitalist during your hospital stay. Once you are discharged, your primary care physician will handle any further medical issues. Please note that NO REFILLS for any discharge medications will be authorized once you are discharged, as it is imperative that you return to your primary care physician (or establish a relationship with a primary care physician if you do not  have one) for your aftercare needs so that they can reassess your need for medications and monitor your lab values.

## 2015-06-20 MED FILL — DESCOVY 200-25 MG TABS: 200-25 | 30 days supply | Qty: 30 | Fill #1

## 2015-06-20 MED FILL — TIVICAY 50 MG TABLET: 50 | 30 days supply | Qty: 30 | Fill #1

## 2015-06-21 ENCOUNTER — Telehealth: Payer: Self-pay | Admitting: *Deleted

## 2015-06-21 NOTE — Telephone Encounter (Signed)
Transition Care Management Follow-up Telephone Call   Date discharged? 06/19/15   How have you been since you were released from the hospital? Pt states he is doing all right could be better   Do you understand why you were in the hospital? YES   Do you understand the discharge instructions? YES   Where were you discharged to? Home   Items Reviewed:  Medications reviewed: YES  Allergies reviewed: YES  Dietary changes reviewed: NO  Referrals reviewed: YES, will be f/u with Dr. Nigel Berthold   Functional Questionnaire:   Activities of Daily Living (ADLs):   He states he are independent in the following: ambulation, bathing and hygiene, feeding, continence, grooming, toileting and dressing States he doesn't require assistance    Any transportation issues/concerns?: NO   Any patient concerns? YES   Confirmed importance and date/time of follow-up visits scheduled YES, appt 06/30/15  Provider Appointment booked with Dr. Jenny Reichmann  Confirmed with patient if condition begins to worsen call PCP or go to the ER.  Patient was given the office number and encouraged to call back with question or concerns.  : YES

## 2015-06-22 ENCOUNTER — Ambulatory Visit: Payer: Commercial Managed Care - HMO

## 2015-06-23 ENCOUNTER — Ambulatory Visit: Payer: Commercial Managed Care - HMO | Admitting: Oncology

## 2015-06-23 ENCOUNTER — Telehealth: Payer: Self-pay | Admitting: Oncology

## 2015-06-23 NOTE — Telephone Encounter (Signed)
Pt called and rescheduled appt 06/30/15 the same day of dr. Jenny Reichmann (across the street) so he will not have to get additional transportation.

## 2015-06-26 DIAGNOSIS — T560X1D Toxic effect of lead and its compounds, accidental (unintentional), subsequent encounter: Secondary | ICD-10-CM | POA: Diagnosis not present

## 2015-06-27 ENCOUNTER — Telehealth: Payer: Self-pay | Admitting: *Deleted

## 2015-06-27 DIAGNOSIS — G894 Chronic pain syndrome: Secondary | ICD-10-CM

## 2015-06-27 NOTE — Telephone Encounter (Signed)
Received call pt is requesting refill on his hydrocodone...Billy Lopez

## 2015-06-27 NOTE — Telephone Encounter (Signed)
Notified pt with md response. Pt states he has to take a whole pill when he takes a 1/2 pill it does not help with the pain. He stated that md was suppose to refer him to a pain md but he never did...Billy Lopez

## 2015-06-27 NOTE — Telephone Encounter (Signed)
Pt already received #30 on jan 16   I normally do not practice chronic pain treatment, but it appears if pt is needing increasing doses, i can refer to pain clinic

## 2015-06-27 NOTE — Telephone Encounter (Signed)
Forwarded to Triage.

## 2015-06-28 ENCOUNTER — Encounter: Payer: Self-pay | Admitting: Infectious Disease

## 2015-06-28 ENCOUNTER — Ambulatory Visit (INDEPENDENT_AMBULATORY_CARE_PROVIDER_SITE_OTHER): Payer: Commercial Managed Care - HMO | Admitting: Infectious Disease

## 2015-06-28 ENCOUNTER — Ambulatory Visit: Payer: Commercial Managed Care - HMO | Admitting: Pharmacist Clinician (PhC)/ Clinical Pharmacy Specialist

## 2015-06-28 ENCOUNTER — Telehealth: Payer: Self-pay

## 2015-06-28 VITALS — BP 85/57 | HR 101 | Temp 98.3°F

## 2015-06-28 DIAGNOSIS — B20 Human immunodeficiency virus [HIV] disease: Secondary | ICD-10-CM | POA: Diagnosis not present

## 2015-06-28 DIAGNOSIS — M25512 Pain in left shoulder: Secondary | ICD-10-CM | POA: Diagnosis not present

## 2015-06-28 DIAGNOSIS — N183 Chronic kidney disease, stage 3 unspecified: Secondary | ICD-10-CM

## 2015-06-28 DIAGNOSIS — M129 Arthropathy, unspecified: Secondary | ICD-10-CM | POA: Diagnosis not present

## 2015-06-28 DIAGNOSIS — M00812 Arthritis due to other bacteria, left shoulder: Secondary | ICD-10-CM | POA: Diagnosis not present

## 2015-06-28 DIAGNOSIS — M1180 Other specified crystal arthropathies, unspecified site: Secondary | ICD-10-CM

## 2015-06-28 DIAGNOSIS — R509 Fever, unspecified: Secondary | ICD-10-CM

## 2015-06-28 DIAGNOSIS — M1120 Other chondrocalcinosis, unspecified site: Secondary | ICD-10-CM

## 2015-06-28 DIAGNOSIS — M19019 Primary osteoarthritis, unspecified shoulder: Secondary | ICD-10-CM

## 2015-06-28 HISTORY — DX: Chronic kidney disease, stage 3 unspecified: N18.30

## 2015-06-28 MED ORDER — HYDROCODONE-ACETAMINOPHEN 10-325 MG PO TABS: ORAL_TABLET | ORAL | Status: DC

## 2015-06-28 MED ORDER — PREDNISONE 20 MG PO TABS: 20.0000 mg | ORAL_TABLET | Freq: Every day | ORAL | Status: DC

## 2015-06-28 NOTE — Progress Notes (Signed)
HPI: Billy Lopez. is a 75 y.o. male who is here for a random follow up for some reason.   Allergies: No Known Allergies  Vitals: Temp: 98.3 F (36.8 C) (01/25 0919) Temp Source: Oral (01/25 0919) BP: 85/57 mmHg (01/25 0919) Pulse Rate: 101 (01/25 0919)  Past Medical History: Past Medical History  Diagnosis Date  . Hypertension   . HIV infection ([place])   . Substance abuse   . 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 Allergic rhinitis, cause unspecified 07/21/2011  . Cervical spondylosis 07/21/2011  . Lumbar spondylosis 07/21/2011  . Osteoarthritis 07/21/2011  . COPD (chronic obstructive pulmonary disease) ([place]) 07/21/2011  . 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 Hx of radiation therapy 12/17/11 - 02/13/12    prostate  . Prostate cancer ([place] 09/19/11    gleason 7  . H/O blood clots   . Pneumonia   . GERD (gastroesophageal reflux disease)   . Hepatitis     Hep C  . Rectal bleeding   . Wears dentures     top  . Wears glasses   . Septic joint of left shoulder region [facility]) 06/15/2015    Social History: Social History   Social History  . Marital Status: Divorced    Spouse Name: N/A  . Number of Children: 3  . Years of Education: 16   Occupational History  . UNEMPLOYED   . Retired     Social worker   Social History Main Topics  . Smoking status: Former Smoker -- 1.00 packs/day for 10 years    Types: Cigarettes    Quit date: 10/13/1960  . Smokeless tobacco: Never Used  . Alcohol Use: No  . Drug Use: No     Comment: pt. states stopped using drugs 10/12  . Sexual Activity: Not Currently     Comment: pt. given condoms   Other Topics Concern  . Not on file   Social History Narrative   Separated x 7 years   3 daughters    Culture:mother is [place] and father hispanic   Speaks [place], [place], [place] and [place].          Previous Regimen: Atripla  Current Regimen: DTG + Descovy  Labs: HIV 1 RNA QUANT (copies/mL)  Date Value  06/14/2015 <20  05/16/2015  <20  09/19/2014 <20   CD4 T CELL ABS (/uL)  Date Value  06/14/2015 130*  05/16/2015 360*  09/19/2014 900   HEP B S AB (no units)  Date Value  09/25/2010 NEG   HEPATITIS B SURFACE AG (no units)  Date Value  09/25/2010 NEGATIVE    CrCl: Estimated Creatinine Clearance: 48.9 mL/min (by C-G formula based on Cr of 1.32).  Lipids:    Component Value Date/Time   CHOL 244* 02/22/2015 1118   TRIG 154.0* 02/22/2015 1118   HDL 51.60 02/22/2015 1118   CHOLHDL 5 02/22/2015 1118   VLDL 30.8 02/22/2015 1118   LDLCALC 161* 02/22/2015 1118    Assessment: Billy Lopez was recently saw [place] here for f/u but he had a fever and sore shoulder at that time. He was sent to the ED to get it eval. He was admitted at that time to r/o septic shoulder. He was dx with pseudogout and was discharged on a prednisone taper. He stated that his shoulder is still hurting. He stated that he has an appt to his primary this Friday. They are trying to refer him to the pain clinic. He also stated that he has and appt with  ortho coming up. Dr. Tommy Medal is going try another short course of steroids.   Recommendations:  Cont ART Prednisone course F/u with primary for pain meds  Billy Lopez, PharmD Clinical Infectious [facility] 06/28/2015, 9:40 AM

## 2015-06-28 NOTE — Progress Notes (Signed)
Chief complaint: shoulder pain  Subjective:    Patient ID: Billy Lopez., male    DOB: 1941-03-26, 75 y.o.   MRN: [id number]  HPI  76 year old [place] American man with HIV that is perfectly controlled on Tivicay and Descovy.  Lab Results  Component Value Date   HIV1RNAQUANT <20 06/14/2015   Lab Results  Component Value Date   CD4TABS 130* 06/14/2015   CD4TABS 360* 05/16/2015   CD4TABS 900 09/19/2014   Due to persistent shoulder pain that was severe he had MRI on 06/14/15.  IMPRESSION: 1. Very limited examination as discussed above. 2. Large full-thickness complete retracted rotator cuff tear. 3. Likely torn and retracted long head biceps tendon. 4. Advanced glenohumeral joint degenerative changes and moderate AC joint degenerative changes. Suspect previous surgery with probable prior acromioplasty. 5. Large amount of joint fluid and fluid in the bursa but I do not see any definite findings that would suggest this is septic arthritis or osteomyelitis. Large loose bodies are noted in the joint and in the bursa. 6. 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  He was admitted and taken to the OR by Dr. [name] who performed   ARTHROSCOPIC SHOULDER IRRIGATION AND DEBRIDEMENT  Fluid was sent for cell count which showed:  [id number] with 90% PMN's  There were INTRA and EXTRACellular calcium pyrophosphate crystals seen.  Patient was on IV antibiotics postoperatively but failed to grow any organism and my partner Dr. Megan Salon who followed the patient closely felt that this was unlikely to be infection but rather acute pseudogout superimposed upon chronic changes the patient already had. He was sent home on a steroid taper but states he feels "no better" He is without fevers, chills malaise at present.      Review of Systems  Constitutional: Negative for fever, chills, diaphoresis, activity change, appetite change, fatigue and unexpected weight  change.  HENT: Negative for congestion, rhinorrhea, sinus pressure, sneezing, sore throat and trouble swallowing.   Eyes: Negative for photophobia and visual disturbance.  Respiratory: Negative for cough, chest tightness, shortness of breath, wheezing and stridor.   Cardiovascular: Negative for chest pain, palpitations and leg swelling.  Gastrointestinal: Negative for nausea, vomiting, abdominal pain, diarrhea, constipation, blood in stool, abdominal distention and anal bleeding.  Genitourinary: Negative for dysuria, hematuria, flank pain and difficulty urinating.  Musculoskeletal: Positive for joint swelling and arthralgias. Negative for myalgias, back pain and gait problem.  Skin: Negative for color change, pallor, rash and wound.  Neurological: Negative for dizziness, tremors, weakness and light-headedness.  Hematological: Negative for adenopathy. Does not bruise/bleed easily.  Psychiatric/Behavioral: Negative for behavioral problems, confusion, sleep disturbance, dysphoric mood, decreased concentration and agitation.       Objective:   Physical Exam  Constitutional: He is oriented to person, place, and time.  HENT:  Head: Normocephalic and atraumatic.  Eyes: Conjunctivae and EOM are normal.  Neck: Normal range of motion. Neck supple.  Cardiovascular: Normal rate and regular rhythm.   Pulmonary/Chest: Effort normal. No respiratory distress. He has no wheezes.  Abdominal: Soft. He exhibits no distension.  Musculoskeletal:       Right shoulder: He exhibits decreased range of motion and tenderness.  Neurological: He is alert and oriented to person, place, and time.  Skin: Skin is warm and dry. No rash noted. No erythema. No pallor.          Assessment & Plan:   #1 Inflamed left shoulder: Chronic inflammatory pathology + acute pseudogout +/-  possible infection -- I will give him a 10 day course of 40mg  of prednisone  If there is significant acute pseudogout playing a role then  this should make his pain and the joint better  If it is due to infection the steroids will allow the infection to flourish and declare itself  If it is more due to chronic underlying inflammatory pathology  We will put him on the schedule in mid March with [place] but he should contact us promptly if his shoulder pain worsens  I will also touch base with Dr. [name] and make sure that the patient follows up with him  #2 HIV/AIDS: continue current meds, CD4 could have dipped due to acute illness  #3 Hypotension: BP low today in clinic. I asked him to hold his ACEI for now  #4 CKD: I feel better with him on Decovy with his Tivicay at present  I spent greater than 25  minutes with the patient including greater than 50% of time in face to face counsel of the patient re his HIV, his shoulder pain, pseudogout, hypotension and in coordination of their care.

## 2015-06-28 NOTE — Patient Instructions (Signed)
We will give you another steroid course of 2 X 20 mg of prednisone = 40mg  daily for 10 days  Keep your appt with Dr. Jeneen Rinks on Friday and with Orthopedic surgery  Make an appt with Dr. Linus Salmons or Dr. Megan Salon in mid February

## 2015-06-28 NOTE — Telephone Encounter (Signed)
Pt was contacted by corrine md assistant with md response below...Billy Lopez

## 2015-06-28 NOTE — Telephone Encounter (Signed)
I will give 3 rx for pain med for 3 mo  OK for repeat pain clinic referral  I will under NO circumstances continue to provide further narcotic refills in the future, even if pt is unable to attend pain clinic, and claims he will withdraw if not given further medication  I understand if pt then seeks care elsewhere and changes if PCP's if this occurs  This policy is due to increased scrutiny of pain medication practices by local and state authorities, and I have no intention of coming to their attention

## 2015-06-28 NOTE — Telephone Encounter (Signed)
Called patient to let him know that his prescriptions are ready to be picked up in the office.

## 2015-06-30 ENCOUNTER — Encounter: Payer: Self-pay | Admitting: Internal Medicine

## 2015-06-30 ENCOUNTER — Ambulatory Visit: Payer: Commercial Managed Care - HMO | Admitting: Oncology

## 2015-06-30 ENCOUNTER — Other Ambulatory Visit (INDEPENDENT_AMBULATORY_CARE_PROVIDER_SITE_OTHER): Payer: Commercial Managed Care - HMO

## 2015-06-30 ENCOUNTER — Ambulatory Visit (INDEPENDENT_AMBULATORY_CARE_PROVIDER_SITE_OTHER): Payer: Commercial Managed Care - HMO | Admitting: Internal Medicine

## 2015-06-30 VITALS — BP 112/64 | HR 85 | Temp 97.8°F | Resp 20 | Wt 160.0 lb

## 2015-06-30 DIAGNOSIS — M1180 Other specified crystal arthropathies, unspecified site: Secondary | ICD-10-CM

## 2015-06-30 DIAGNOSIS — M1120 Other chondrocalcinosis, unspecified site: Secondary | ICD-10-CM

## 2015-06-30 DIAGNOSIS — G8929 Other chronic pain: Secondary | ICD-10-CM

## 2015-06-30 DIAGNOSIS — J42 Unspecified chronic bronchitis: Secondary | ICD-10-CM

## 2015-06-30 DIAGNOSIS — I10 Essential (primary) hypertension: Secondary | ICD-10-CM | POA: Diagnosis not present

## 2015-06-30 LAB — BASIC METABOLIC PANEL
BUN: 46 mg/dL — ABNORMAL HIGH (ref 6–23)
CO2: 23 mEq/L (ref 19–32)
Calcium: 8.7 mg/dL (ref 8.4–10.5)
Chloride: 103 mEq/L (ref 96–112)
Creatinine, Ser: 1.4 mg/dL (ref 0.40–1.50)
GFR: 63.89 mL/min (ref >60.00)
GLUCOSE: 143 mg/dL — AB (ref 70–99)
POTASSIUM: 4.4 meq/L (ref 3.5–5.1)
SODIUM: 131 meq/L — AB (ref 135–145)

## 2015-06-30 LAB — CBC WITH DIFFERENTIAL/PLATELET
BASOS PCT: 0.1 % (ref 0.0–3.0)
Basophils Absolute: 0 10*3/uL (ref 0.0–0.1)
EOS ABS: 0 10*3/uL (ref 0.0–0.7)
EOS PCT: 0 % (ref 0.0–5.0)
HCT: 33.1 % — ABNORMAL LOW (ref 39.0–52.0)
HEMOGLOBIN: 10.9 g/dL — AB (ref 13.0–17.0)
LYMPHS ABS: 0.7 10*3/uL (ref 0.7–4.0)
Lymphocytes Relative: 5.9 % — ABNORMAL LOW (ref 12.0–46.0)
MCHC: 33 g/dL (ref 30.0–36.0)
MCV: 87.8 fl (ref 78.0–100.0)
MONO ABS: 0.4 10*3/uL (ref 0.1–1.0)
Monocytes Relative: 3.5 % (ref 3.0–12.0)
NEUTROS ABS: 9.1 10*3/uL — AB (ref 1.4–7.7)
Platelets: 364 10*3/uL (ref 150.0–400.0)
RBC: 3.77 Mil/uL — ABNORMAL LOW (ref 4.22–5.81)
RDW: 14.2 % (ref 11.5–15.5)
WBC: 10.1 10*3/uL (ref 4.0–10.5)

## 2015-06-30 NOTE — Patient Instructions (Addendum)
Please continue all other medications as before, and refills have been done if requested.  Please have the pharmacy call with any other refills you may need.  Please continue your efforts at being more active, low cholesterol diet, and weight control.  You are otherwise up to date with prevention measures today.  Please keep your appointments with your specialists as you may have planned  Please go to the LAB in the Basement (turn left off the elevator) for the tests to be done today  You will be contacted by phone if any changes need to be made immediately.  Otherwise, you will receive a letter about your results with an explanation, but please check with MyChart first.  Please remember to sign up for MyChart if you have not done so, as this will be important to you in the future with finding out test results, communicating by private email, and scheduling acute appointments online when needed.  Please return as scheduled, or sooner if needed

## 2015-06-30 NOTE — Progress Notes (Signed)
Pre visit review using our clinic review tool, if applicable. No additional management support is needed unless otherwise documented below in the visit note. 

## 2015-06-30 NOTE — Progress Notes (Addendum)
Subjective:    Patient ID: Billy Brink., male    DOB: 30-Jul-1940, 75 y.o.   MRN: [id number]  HPI  Here to f/u recent hospn ;  Left shoulder pseudogout flare: Admitted with left shoulder swelling/pain along with fever-initially thought to have septic arthritis. Underwent incision and drainage of left shoulder on 06/15/15. Blood and Synovial fluid cultures negative so far, however synovial fluid positive for calcium pyrophosphate crystals-indicating pseudogout.Infectious disease and orthopedics followed patient throughout hospital stay. Since all cultures are negative, antibiotics were discontinued on 1/15, patient was observed for another 24 hours-he continues to do well without fever. ID does not recommend any further antibiotics.  Overall shoulder pain improved.  No fever, has f/u later today with ortho.Pt denies chest pain, increased sob or doe, wheezing, orthopnea, PND, increased LE swelling, palpitations, dizziness or syncope.  Pt denies new neurological symptoms such as new headache, or facial or extremity weakness or numbness   Pt denies polydipsia, polyuria, or low sugar symptoms such as weakness or confusion improved with po intake.  Pt states overall good compliance with meds  Still has not been accepted to pain management, overall pain control ok, has 3 mo med supply for now. Past Medical History  Diagnosis Date  . Hypertension   . HIV infection ([place])   . Substance abuse   . 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 Allergic rhinitis, cause unspecified 07/21/2011  . Cervical spondylosis 07/21/2011  . Lumbar spondylosis 07/21/2011  . Osteoarthritis 07/21/2011  . COPD (chronic obstructive pulmonary disease) ([place]) 07/21/2011  . 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 Hx of radiation therapy 12/17/11 - 02/13/12    prostate  . Prostate cancer ([place]) 09/19/11    gleason 7  . H/O blood clots   . Pneumonia   . GERD (gastroesophageal reflux disease)   . Hepatitis     Hep C  . Rectal bleeding   . Wears  dentures     top  . Wears glasses   . Septic joint of left shoulder region ([place]) 06/15/2015  . CKD (chronic kidney disease) stage 3, GFR 30-59 ml/min 06/28/2015   Past Surgical History  Procedure Laterality Date  . Hip surgery      total bilateral replacement  . Hernia repair    . Hand surgery      right thumb  . Foot surgery      bilat. ingrown toenails removed  . Colonoscopy N/A 11/17/2012    Procedure: COLONOSCOPY;  Surgeon: Ladene Artist, MD;  Location: [facility];  Service: Endoscopy;  Laterality: N/A;  possible APC  . Amputation Right 03/31/2014    Procedure: RIGHT SECOND TOE AMPUTATION ;  Surgeon: Wylene Simmer, MD;  Location: [facility];  Service: Orthopedics;  Laterality: Right;  . Total hip revision Left 10/08/2014    Procedure: POSTERIOR LEFT TOTAL HIP REVISION;  Surgeon: Paralee Cancel, MD;  Location: [facility];  Service: Orthopedics;  Laterality: Left;  . Shoulder arthroscopy Left 06/15/2015    Procedure: ARTHROSCOPIC SHOULDER IRRIGATION AND DEBRIDEMENT;  Surgeon: Marchia Bond, MD;  Location: [facility];  Service: Orthopedics;  Laterality: Left;    reports that he quit smoking about 54 years ago. His smoking use included Cigarettes. He has a 10 pack-year smoking history. He has never used smokeless tobacco. He reports that he does not drink alcohol or use illicit drugs. family history includes Alcohol abuse in his other; Arthritis in his other; Cancer in his mother; Hypertension in his other. There  is no history of Colon cancer or Diabetes. No Known Allergies Current Outpatient Prescriptions on File Prior to Visit  Medication Sig Dispense Refill  . acetaminophen (TYLENOL) 325 MG tablet Take 2 tablets (650 mg total) by mouth every 6 (six) hours as needed for mild pain (or Fever >/= 101). 30 tablet 0  . aspirin EC 325 MG EC tablet Take 1 tablet (325 mg total) by mouth 2 (two) times daily. 60 tablet 0  . bisacodyl (DULCOLAX) 5 MG EC tablet Take 1 tablet (5 mg total) by  mouth daily as needed for moderate constipation. 30 tablet 0  . cholecalciferol (VITAMIN D) 1000 UNITS tablet Take 1,000 Units by mouth every evening.    . dolutegravir (TIVICAY) 50 MG tablet Take 1 tablet (50 mg total) by mouth daily. 30 tablet 6  . emtricitabine-tenofovir AF (DESCOVY) 200-25 MG tablet Take 1 tablet by mouth at bedtime. 30 tablet 6  . feeding supplement, ENSURE COMPLETE, (ENSURE COMPLETE) LIQD Take 237 mLs by mouth 2 (two) times daily between meals. 474 mL 11  . furosemide (LASIX) 20 MG tablet Take 1 tablet (20 mg total) by mouth daily. 30 tablet 11  . HYDROcodone-acetaminophen (NORCO) 10-325 MG tablet take 1/2 to 1 tablet by mouth every 8 hours if needed 90 tablet 0  . potassium chloride (K-DUR) 10 MEQ tablet Take 1 tablet (10 mEq total) by mouth daily. 90 tablet 3  . predniSONE (DELTASONE) 20 MG tablet Take 1 tablet (20 mg total) by mouth daily with breakfast. 20 tablet 1  . tamsulosin (FLOMAX) 0.4 MG CAPS take 1 capsule by mouth twice a day 60 capsule 0  . vitamin A 10000 UNIT capsule Take 10,000 Units by mouth every evening.    . vitamin C (ASCORBIC ACID) 500 MG tablet Take 500 mg by mouth every evening.     No current facility-administered medications on file prior to visit.   Review of Systems  Constitutional: Negative for unusual diaphoresis or night sweats HENT: Negative for ringing in ear or discharge Eyes: Negative for double vision or worsening visual disturbance.  Respiratory: Negative for choking and stridor.   Gastrointestinal: Negative for vomiting or other signifcant bowel change Genitourinary: Negative for hematuria or change in urine volume.  Musculoskeletal: Negative for other MSK pain or swelling Skin: Negative for color change and worsening wound.  Neurological: Negative for tremors and numbness other than noted  Psychiatric/Behavioral: Negative for decreased concentration or agitation other than above       Objective:   Physical Exam BP 112/64  mmHg  Pulse 85  Temp(Src) 97.8 F (36.6 C) (Oral)  Resp 20  Wt 160 lb (72.576 kg)  SpO2 96% VS noted,  Constitutional: Pt appears in no significant distress HENT: Head: NCAT.  Right Ear: External ear normal.  Left Ear: External ear normal.  Eyes: . Pupils are equal, round, and reactive to light. Conjunctivae and EOM are normal Neck: Normal range of motion. Neck supple.  Cardiovascular: Normal rate and regular rhythm.   Pulmonary/Chest: Effort normal and breath sounds without rales or wheezing.  Neurological: Pt is alert. Not confused , motor grossly intact Skin: Skin is warm. No rash, no LE edema Psychiatric: Pt behavior is normal. No agitation.   Synovial, body fluid and blood cultures  - no growth to date    Assessment & Plan:

## 2015-07-01 NOTE — Assessment & Plan Note (Signed)
stable overall by history and exam, recent data reviewed with pt, and pt to continue medical treatment as before,  to f/u any worsening symptoms or concerns SpO2 Readings from Last 3 Encounters:  06/30/15 96%  06/19/15 100%  02/22/15 95%

## 2015-07-01 NOTE — Assessment & Plan Note (Addendum)
Improved, o/w stable overall by history and exam, recent data reviewed with pt, and pt to continue medical treatment as before,  For cbc , bmp as recommended at d/c

## 2015-07-01 NOTE — Assessment & Plan Note (Signed)
stable overall by history and exam, has had pain meds refilled, referred as well to pain management, pt informed I no longer practice pain management and will not be providing further narcotic, and pt to continue medical treatment as before

## 2015-07-01 NOTE — Assessment & Plan Note (Signed)
stable overall by history and exam, recent data reviewed with pt, and pt to continue medical treatment as before,  to f/u any worsening symptoms or concerns BP Readings from Last 3 Encounters:  06/30/15 112/64  06/28/15 85/57  06/19/15 119/80

## 2015-07-05 IMAGING — CT CT HEAD W/O CM
1 series · 16 of 30 positions shown, 20 images · non-contrast
Comparison: 10/04/2014

CLINICAL DATA: Confusion and slurred speech

EXAM:
CT HEAD WITHOUT CONTRAST
TECHNIQUE: Contiguous axial images were obtained from the base of the skull
through the vertex without intravenous contrast.

[Series 2: head 5.0 h30s · axial · 0.43mm/px · z∈[+1336,+1471]mm · 16 of 31 slices shown, 20 images]
[im 2/31  brain]
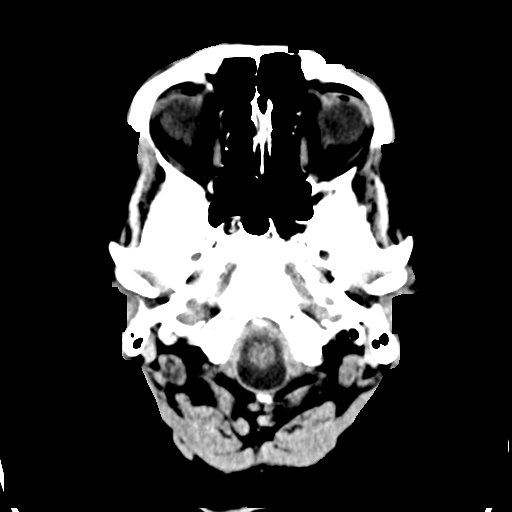
[im 2/31  bone]
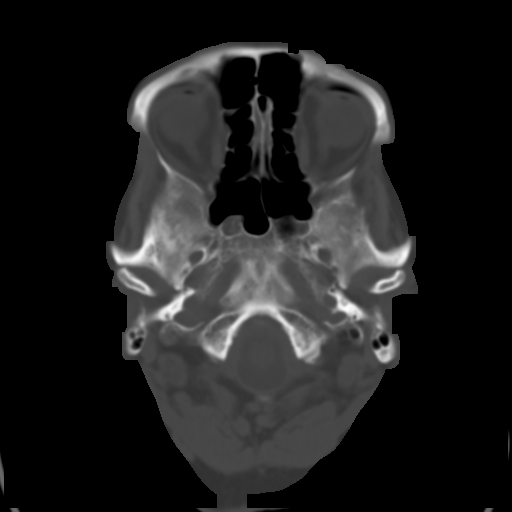
[im 4/31  brain]
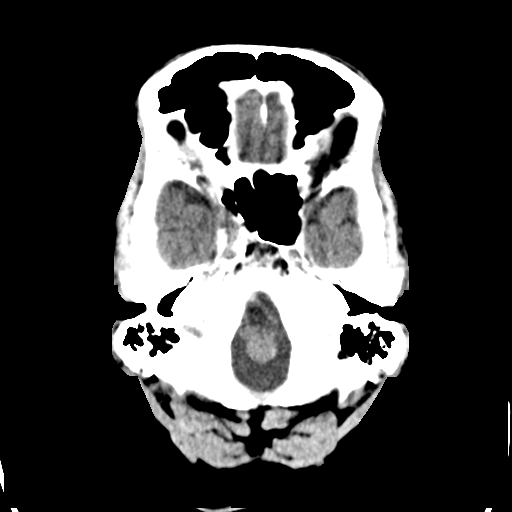
[im 6/31  brain]
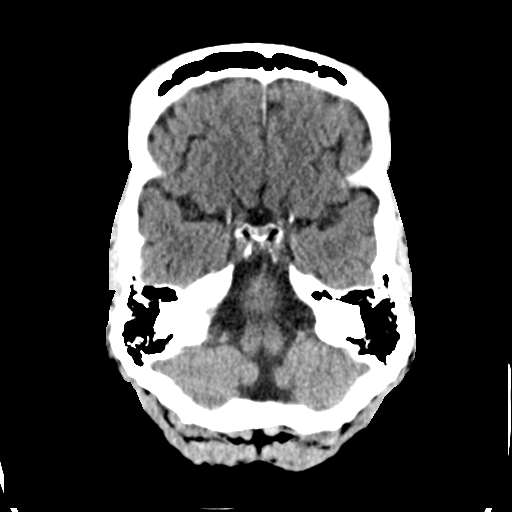
[im 8/31  brain]
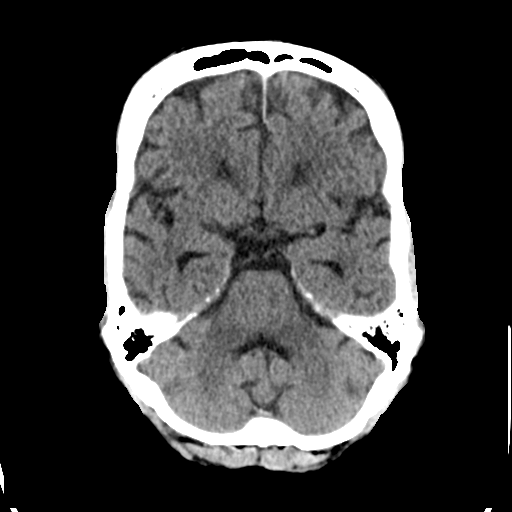
[im 9/31  brain]
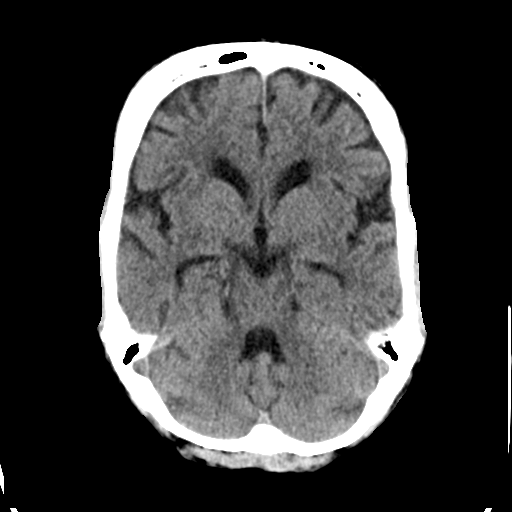
[im 9/31  bone]
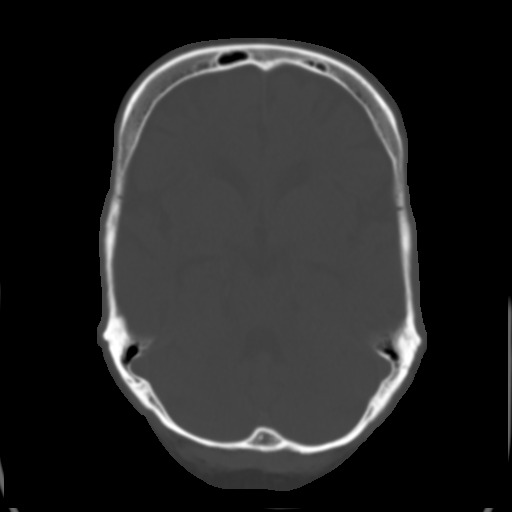
[im 11/31  brain]
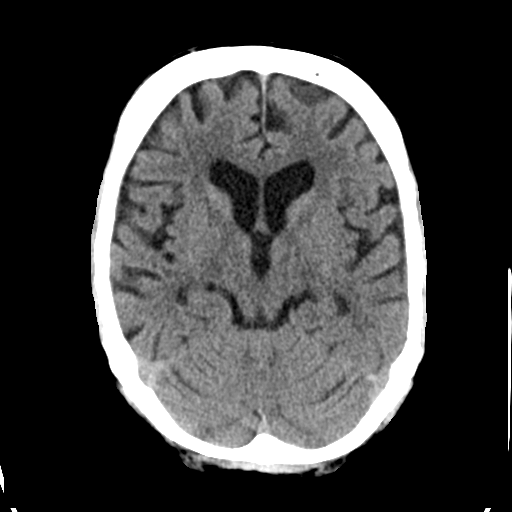
[im 13/31  brain]
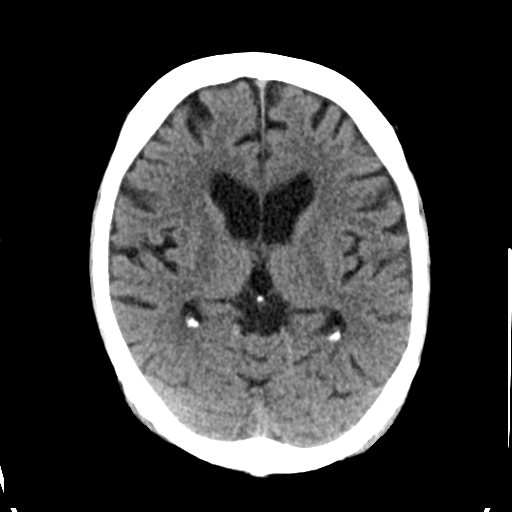
[im 15/31  brain]
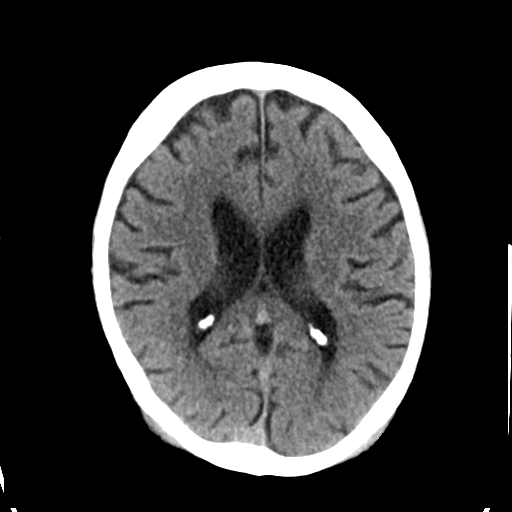
[im 16/31  brain]
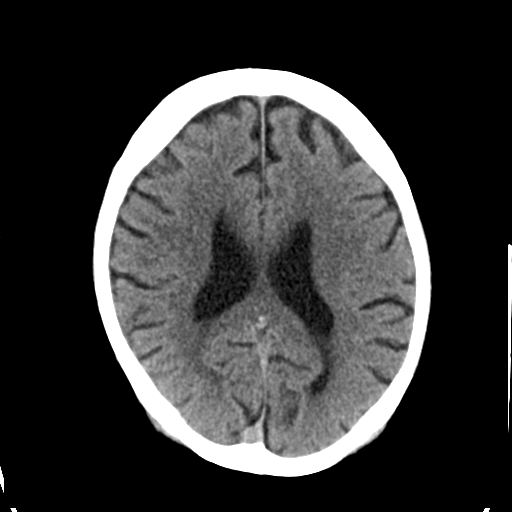
[im 16/31  bone]
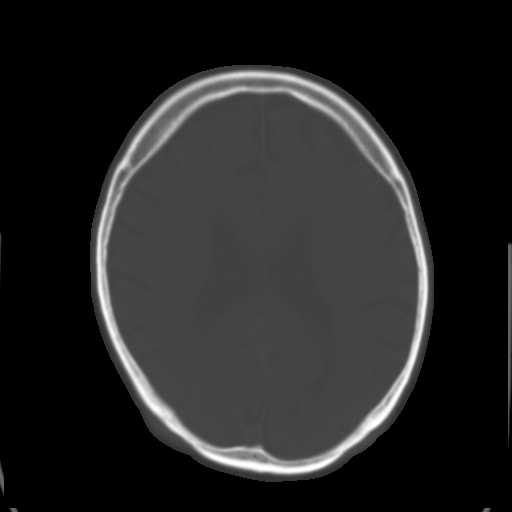
[im 18/31  brain]
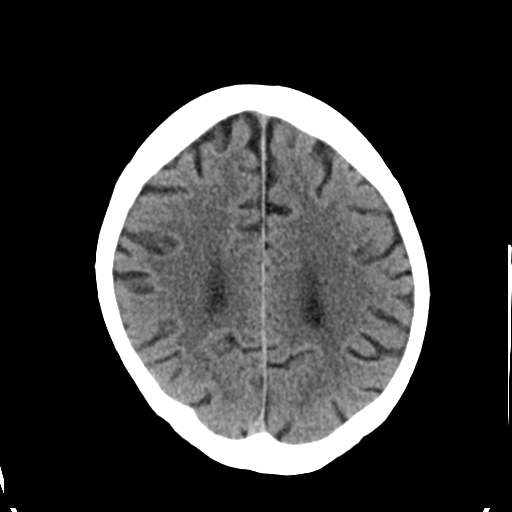
[im 20/31  brain]
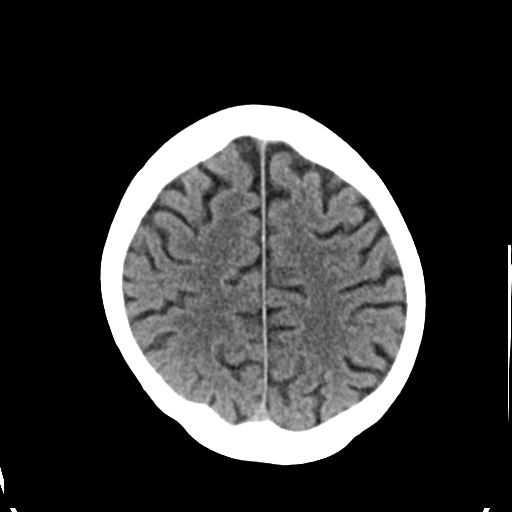
[im 22/31  brain]
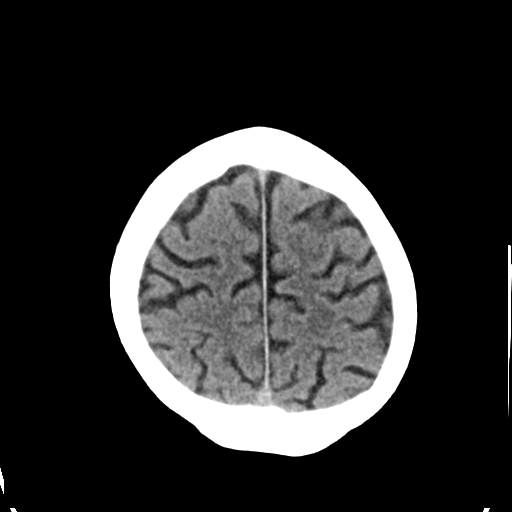
[im 23/31  brain]
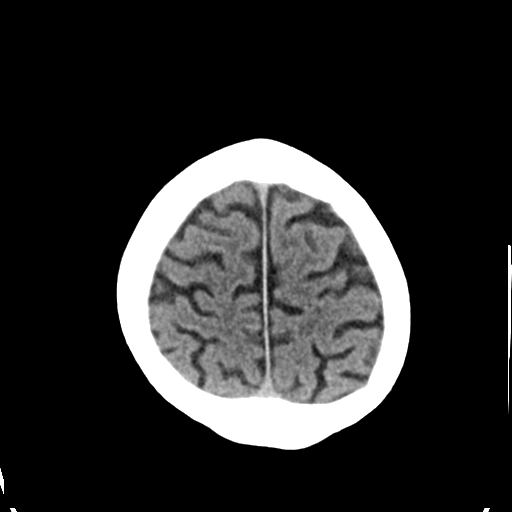
[im 23/31  bone]
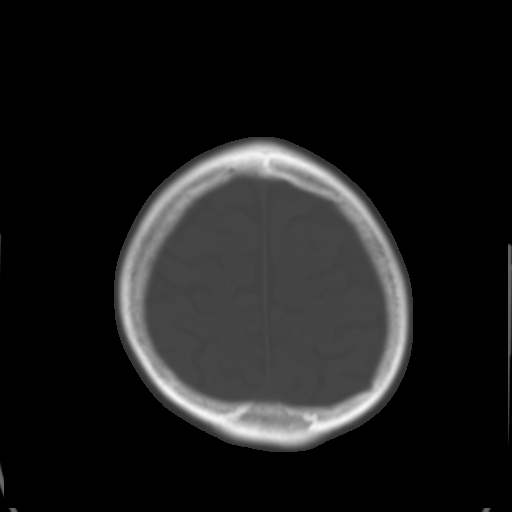
[im 25/31  brain]
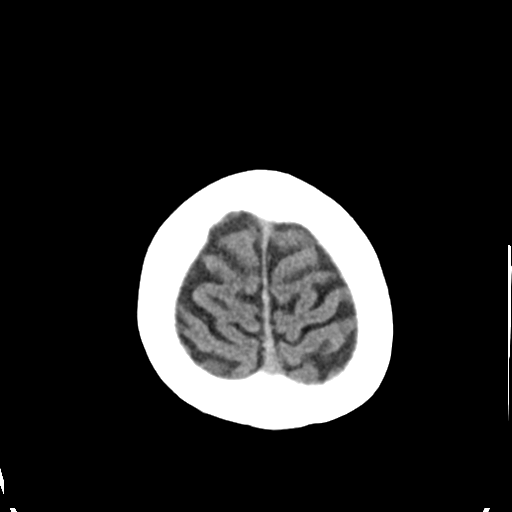
[im 27/31  brain]
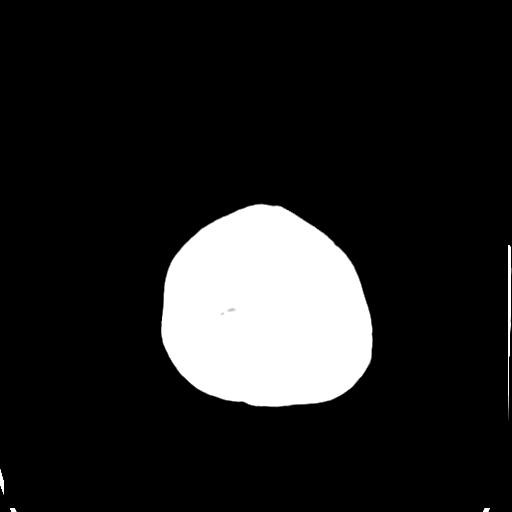
[im 29/31  brain]
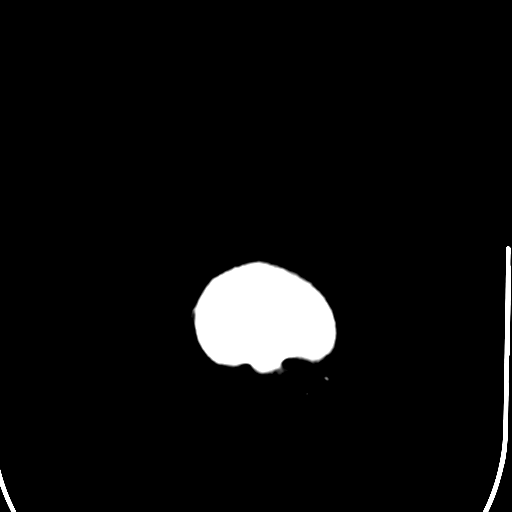

[16 of 30 positions shown; findings below may reference images not displayed]

FINDINGS: Bony calvarium is intact. Diffuse atrophic changes are again
identified. No findings to suggest acute hemorrhage, acute
infarction or space-occupying mass lesion are noted.
IMPRESSION: Mild atrophic changes stable from the previous exam.

## 2015-07-19 MED FILL — DESCOVY 200-25 MG TABS: 200-25 | 30 days supply | Qty: 30 | Fill #2

## 2015-07-19 MED FILL — TIVICAY 50 MG TABLET: 50 | 30 days supply | Qty: 30 | Fill #2

## 2015-07-20 ENCOUNTER — Ambulatory Visit (INDEPENDENT_AMBULATORY_CARE_PROVIDER_SITE_OTHER): Payer: Commercial Managed Care - HMO | Admitting: Internal Medicine

## 2015-07-20 ENCOUNTER — Encounter: Payer: Self-pay | Admitting: Internal Medicine

## 2015-07-20 VITALS — BP 114/76 | HR 88 | Temp 98.2°F | Ht 68.0 in | Wt 160.0 lb

## 2015-07-20 DIAGNOSIS — B20 Human immunodeficiency virus [HIV] disease: Secondary | ICD-10-CM

## 2015-07-20 DIAGNOSIS — M25512 Pain in left shoulder: Secondary | ICD-10-CM | POA: Diagnosis not present

## 2015-07-20 NOTE — Progress Notes (Signed)
CC: Follow up for HIV  Interval history: Currently is asymptomatic and well-controlled on Tivicay and Descovy.  Since last visit he has had no missed doses and had his CD4 and viral load checked in the hospital and is undetectable.  His CD4 is low under 200 but this was in the setting of steroids and not comparable to previous.   Has no associated n/v/d.    Also has shoulder inflammation which I sent him to the hospital for last month. He underwent surgical management by Dr. [name] and is feeling much better.  No current shoulder pain.   Prior to Admission medications   Medication Sig Start Date End Date Taking? Authorizing Provider  acetaminophen (TYLENOL) 325 MG tablet Take 2 tablets (650 mg total) by mouth every 6 (six) hours as needed for mild pain (or Fever >/= 101). 10/13/14  Yes Robbie Lis, MD  aspirin EC 325 MG EC tablet Take 1 tablet (325 mg total) by mouth 2 (two) times daily. 10/13/14  Yes Robbie Lis, MD  bisacodyl (DULCOLAX) 5 MG EC tablet Take 1 tablet (5 mg total) by mouth daily as needed for moderate constipation. 10/13/14  Yes Robbie Lis, MD  cholecalciferol (VITAMIN D) 1000 UNITS tablet Take 1,000 Units by mouth every evening.   Yes Historical Provider, MD  dolutegravir (TIVICAY) 50 MG tablet Take 1 tablet (50 mg total) by mouth daily. 05/22/15  Yes Thayer Headings, MD  emtricitabine-tenofovir AF (DESCOVY) 200-25 MG tablet Take 1 tablet by mouth at bedtime. 05/22/15  Yes Thayer Headings, MD  feeding supplement, ENSURE COMPLETE, (ENSURE COMPLETE) LIQD Take 237 mLs by mouth 2 (two) times daily between meals. 04/12/15  Yes Biagio Borg, MD  furosemide (LASIX) 20 MG tablet Take 1 tablet (20 mg total) by mouth daily. 11/09/14  Yes Biagio Borg, MD  HYDROcodone-acetaminophen [facility]) 10-325 MG tablet take 1/2 to 1 tablet by mouth every 8 hours if needed 06/28/15  Yes Biagio Borg, MD  potassium chloride (K-DUR) 10 MEQ tablet Take 1 tablet (10 mEq total) by mouth daily. 11/23/14  Yes Biagio Borg, MD  predniSONE (DELTASONE) 20 MG tablet Take 1 tablet (20 mg total) by mouth daily with breakfast. 06/28/15  Yes Truman Hayward, MD  tamsulosin [facility]) 0.4 MG CAPS take 1 capsule by mouth twice a day 10/01/12  Yes Franchot Gallo, MD  vitamin A 10000 UNIT capsule Take 10,000 Units by mouth every evening.   Yes Historical Provider, MD  vitamin C (ASCORBIC ACID) 500 MG tablet Take 500 mg by mouth every evening.   Yes Historical Provider, MD    Review of Systems Constitutional: negative for fevers and chills Gastrointestinal: negative for diarrhea and constipation Musculoskeletal: negative for myalgias and arthralgias All other systems reviewed and are negative   Physical Exam: CONSTITUTIONAL:in no apparent distress and alert  Filed Vitals:   07/20/15 0904  BP: 114/76  Pulse: 88  Temp: 98.2 F (36.8 C)   Eyes: anicteric HENT: no thrush, no cervical lymphadenopathy Respiratory: Normal respiratory effort; CTA B  Lab Results  Component Value Date   HIV1RNAQUANT <20 06/14/2015   HIV1RNAQUANT <20 05/16/2015   HIV1RNAQUANT <20 09/19/2014   No components found for: HIV1GENOTYPRPLUS No components found for: THELPERCELL

## 2015-07-20 NOTE — Assessment & Plan Note (Signed)
Doing great on his regimen.  His CD4 is low but this is consistent with transient decrease with steroids and acute illness when it was checked.  I do not feel there is any indication to recheck it today with his continued undetectable viral load.  No indication for OI prophylaxis either.

## 2015-07-20 NOTE — Assessment & Plan Note (Signed)
Now improved.  Had stitches removed by Dr. [name].

## 2015-07-25 ENCOUNTER — Other Ambulatory Visit: Payer: Self-pay

## 2015-07-25 ENCOUNTER — Telehealth: Payer: Self-pay | Admitting: *Deleted

## 2015-07-25 NOTE — Telephone Encounter (Signed)
He has primary care and if more prednisone is needed, he should talk to Dr. [name]. thanks

## 2015-07-25 NOTE — Telephone Encounter (Signed)
Request to refill Prednisone rx.  Pt completed course of prednisone and has had shoulder surgery.  Does prednisone need refilled?  MD please advise.

## 2015-07-25 NOTE — Patient Outreach (Signed)
[facility] Care Management  07/25/2015  Billy Lopez 12-Jan-1941 [id number]    Telephone Screen  Referral Date: 07/24/15 Referral Source: [employer]) Referral Reason: "disease and symptom management. Member would like some more frozen meals delivered."   Outreach attempt # 1 to patient. Patient reached. Screening completed.  Social: Patient resides in his home alone. He states he is ambulatory with use of walker or cane. He also has wheelchair that he uses. He used Medicaid transportation.   Conditions: Patient has h/o: HIV, Hep C, HTN, OA, COPD, CKD stage 2, prostate CA s/p radiation, GERD.  Appointments: Patient states he saw PCP(Dr. Jeneen Lopez) three weeks ago.  Medications: Patient denies any issues with affording and/or obtaining services.   [facility] services discussed. Patient is not interested. He states all he wanted was "box of frozen food."Patietn reports he was enrolled in [employer]'s [employer] program where they provide temporary food assistance by providing meals post discharge. Advised patient that this was a short term/temporary service and not meant to be long term. RN CM offered to assist patient with MOW program or other community programs. He declined stating he only wanted "those frozen meals." RN CM assessed patient's ability to prepare meals. He reported he was still able to cook for himself. He also advised RN CM that he has an aide that comes in DAILY to cook and provide him with a hot meal as well as assist with household duties. Patient not interested in any food pantries or senior hot meal programs/resources at this time.    Plan: RN CM will notify [facility] administrative assistant of case status. RN CM will send MD case closure letter.  Billy Montgomery, RN,BSN,CCM [place] [facility] Telephonic Care Management Coordinator Direct Phone: [phone] Toll Free: [phone] Fax: [phone]

## 2015-07-25 NOTE — Telephone Encounter (Signed)
Would not refill the prednisone. He should follow-up with Dr. [name] disease having significant shoulder pain right now. Also has he established himself with primary care at all?

## 2015-07-31 ENCOUNTER — Telehealth: Payer: Self-pay | Admitting: Internal Medicine

## 2015-07-31 ENCOUNTER — Encounter: Payer: Self-pay | Admitting: Radiation Oncology

## 2015-07-31 NOTE — Telephone Encounter (Signed)
Patient called requested hydrochlorothiazide (HYDRODIURIL) 25 MG tablet [id number] DISCONTINUED  It looks like it was discontinued while he was in the hospital. Please advise if he no longer needs this

## 2015-08-03 ENCOUNTER — Ambulatory Visit
Admission: RE | Admit: 2015-08-03 | Discharge: 2015-08-03 | Disposition: A | Discharge status: Home / Self Care | Payer: Commercial Managed Care - HMO | Source: Ambulatory Visit | Attending: Radiation Oncology | Admitting: Radiation Oncology

## 2015-08-03 ENCOUNTER — Encounter: Payer: Self-pay | Admitting: Radiation Oncology

## 2015-08-03 VITALS — BP 75/47 | HR 79 | Resp 16 | Wt 160.0 lb

## 2015-08-03 DIAGNOSIS — C61 Malignant neoplasm of prostate: Secondary | ICD-10-CM

## 2015-08-03 HISTORY — DX: Personal history of irradiation: Z92.3

## 2015-08-03 NOTE — Progress Notes (Addendum)
BP low in both arms. Denies feeling lightheaded or dizziness. Reports he hasn't eaten since earlier this morning. Provided patient with sandwich, chips, a drink and cheese. Denies dysuria or hematuria. Reports urinary frequency every 2-3 hours during the day. Reports nocturia x 2-3. Reports he keeps a urinal by his bed. Describes a strong steady stream without difficulty emptying. Denies incontinence or leakage. Reports generalized pain managed with Norco. Rising PSA noted. Received Lupron 30 mg on 1/4. Next follow up with [facility] and injection scheduled for May 10th.  BP 75/47 mmHg  Pulse 79  Resp 16  Wt 160 lb (72.576 kg)  SpO2 100% Wt Readings from Last 3 Encounters:  08/03/15 160 lb (72.576 kg)  07/20/15 160 lb (72.576 kg)  06/30/15 160 lb (72.576 kg)
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
## 2015-08-08 ENCOUNTER — Telehealth: Payer: Self-pay | Admitting: Oncology

## 2015-08-08 NOTE — Telephone Encounter (Signed)
Pt confirmed appt for next week with Dr. Alen Blew and reviewed transportation info.

## 2015-08-17 ENCOUNTER — Ambulatory Visit (HOSPITAL_BASED_OUTPATIENT_CLINIC_OR_DEPARTMENT_OTHER): Payer: Commercial Managed Care - HMO | Admitting: Oncology

## 2015-08-17 VITALS — BP 118/79 | HR 87 | Temp 98.3°F | Resp 18 | Ht 68.0 in | Wt 164.7 lb

## 2015-08-17 DIAGNOSIS — C61 Malignant neoplasm of prostate: Secondary | ICD-10-CM | POA: Diagnosis not present

## 2015-08-17 DIAGNOSIS — E291 Testicular hypofunction: Secondary | ICD-10-CM | POA: Diagnosis not present

## 2015-08-17 DIAGNOSIS — B20 Human immunodeficiency virus [HIV] disease: Secondary | ICD-10-CM | POA: Diagnosis not present

## 2015-08-17 DIAGNOSIS — C7951 Secondary malignant neoplasm of bone: Secondary | ICD-10-CM

## 2015-08-17 MED FILL — DESCOVY 200-25 MG TABS: 200-25 | 30 days supply | Qty: 30 | Fill #3

## 2015-08-17 MED FILL — TIVICAY 50 MG TABLET: 50 | 30 days supply | Qty: 30 | Fill #3

## 2015-08-17 NOTE — Progress Notes (Signed)
Please see consult note.  

## 2015-08-17 NOTE — Consult Note (Signed)
Reason for Referral: Prostate cancer.   HPI: 75 year old gentleman native of [place] but have been living in this area since the 44s. Gentleman with history of HIV, COPD, and other comorbid conditions. He was diagnosed with prostate cancer in April 2013. At that time he had a PSA of 5.6 and underwent a biopsy under the care of Dr. Mindi Curling which showed a Gleason score 3+4 = 7. He underwent a definitive therapy utilizing radiation and completed 78 gray between July and September 2013. His PSA started to go up after brief nadir and noted it to be 3.4 and October 2015. A repeat biopsy at that time showed no local recurrence and his staging workup was unrevealing. His PSA continued to rise and in January 2016 his PSA was 16. In September 2016 his PSA was up to 156. Bone scan obtained in November 2016 showed progressive bony metastasis with involvement of the thoracic, lumbar, ribs and pelvic region. He received Lupron in January 2017. He was also evaluated by Dr. Tammi Klippel for possible palliative radiation therapy in March 2017. It was felt that his disease is rather systemic and does not have any focal area of treatment needed. He was referred to me for evaluation regarding these findings. Clinically, he appears to be feeling reasonably fair. He is ambulating with the help of a walker inside his house but does utilize a motorized scooter outside of his house. He does rely on transportation for his appointments. His appetite have been reasonable and denied any decline in his weight. He does report lower extremity pain predominantly knee and hips that appears to be arthritic. He denied any other bone pain such as back pain or chest wall pain. He reports no other constitutional symptoms. He denied any side effects associated with his hormone therapy received. He does report pain in his left shoulder that have been intermittent in nature.  He does not report any headaches, blurry vision, syncope or seizures. He does  not report any fevers, chills, sweats or weight loss. He does not report any chest pain, palpitation or orthopnea. Does not report any cough, wheezing or hemoptysis. Does not report any nausea, vomiting, abdominal pain. Does not report any hematochezia or melena. Does not report any frequency urgency or hesitancy. Remaining review of systems unremarkable.   Past Medical History  Diagnosis Date  . Hypertension   . HIV infection ([place])   . Substance abuse   . 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 Allergic rhinitis, cause unspecified 07/21/2011  . Cervical spondylosis 07/21/2011  . Lumbar spondylosis 07/21/2011  . Osteoarthritis 07/21/2011  . COPD (chronic obstructive pulmonary disease) ([place]) 07/21/2011  . 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 Hx of radiation therapy 12/17/11 - 02/13/12    prostate  . Prostate cancer ([place]) 09/19/11    gleason 7  . H/O blood clots   . Pneumonia   . GERD (gastroesophageal reflux disease)   . Hepatitis     Hep C  . Rectal bleeding   . Wears dentures     top  . Wears glasses   . Septic joint of left shoulder region ([place]) 06/15/2015  . CKD (chronic kidney disease) stage 3, GFR 30-59 ml/min 06/28/2015  . S/P radiation therapy 12/17/2011 through 02/13/2012    78 gray was delivered to the prostate in 40 fractions of 1.95 gray  :  Past Surgical History  Procedure Laterality Date  . Hip surgery      total bilateral replacement  . Hernia repair    .  Hand surgery      right thumb  . Foot surgery      bilat. ingrown toenails removed  . Colonoscopy N/A 11/17/2012    Procedure: COLONOSCOPY;  Surgeon: Ladene Artist, MD;  Location: [facility];  Service: Endoscopy;  Laterality: N/A;  possible APC  . Amputation Right 03/31/2014    Procedure: RIGHT SECOND TOE AMPUTATION ;  Surgeon: Wylene Simmer, MD;  Location: [facility];  Service: Orthopedics;  Laterality: Right;  . Total hip revision Left 10/08/2014    Procedure: POSTERIOR LEFT TOTAL HIP REVISION;  Surgeon:  Paralee Cancel, MD;  Location: [facility];  Service: Orthopedics;  Laterality: Left;  . Shoulder arthroscopy Left 06/15/2015    Procedure: ARTHROSCOPIC SHOULDER IRRIGATION AND DEBRIDEMENT;  Surgeon: Marchia Bond, MD;  Location: [facility];  Service: Orthopedics;  Laterality: Left;  :   Current outpatient prescriptions:  .  acetaminophen (TYLENOL) 325 MG tablet, Take 2 tablets (650 mg total) by mouth every 6 (six) hours as needed for mild pain (or Fever >/= 101)., Disp: 30 tablet, Rfl: 0 .  aspirin EC 325 MG EC tablet, Take 1 tablet (325 mg total) by mouth 2 (two) times daily., Disp: 60 tablet, Rfl: 0 .  bisacodyl (DULCOLAX) 5 MG EC tablet, Take 1 tablet (5 mg total) by mouth daily as needed for moderate constipation., Disp: 30 tablet, Rfl: 0 .  cholecalciferol (VITAMIN D) 1000 UNITS tablet, Take 1,000 Units by mouth every evening., Disp: , Rfl:  .  dolutegravir (TIVICAY) 50 MG tablet, Take 1 tablet (50 mg total) by mouth daily., Disp: 30 tablet, Rfl: 6 .  emtricitabine-tenofovir AF (DESCOVY) 200-25 MG tablet, Take 1 tablet by mouth at bedtime., Disp: 30 tablet, Rfl: 6 .  enalapril (VASOTEC) 20 MG tablet, Take 20 mg by mouth daily., Disp: , Rfl: 0 .  feeding supplement, ENSURE COMPLETE, (ENSURE COMPLETE) LIQD, Take 237 mLs by mouth 2 (two) times daily between meals., Disp: 474 mL, Rfl: 11 .  furosemide (LASIX) 20 MG tablet, Take 1 tablet (20 mg total) by mouth daily., Disp: 30 tablet, Rfl: 11 .  HYDROcodone-acetaminophen (NORCO) 10-325 MG tablet, take 1/2 to 1 tablet by mouth every 8 hours if needed, Disp: 90 tablet, Rfl: 0 .  potassium chloride (K-DUR) 10 MEQ tablet, Take 1 tablet (10 mEq total) by mouth daily., Disp: 90 tablet, Rfl: 3 .  predniSONE (DELTASONE) 20 MG tablet, Take 1 tablet (20 mg total) by mouth daily with breakfast., Disp: 20 tablet, Rfl: 1 .  tamsulosin (FLOMAX) 0.4 MG CAPS, take 1 capsule by mouth twice a day, Disp: 60 capsule, Rfl: 0 .  traMADol (ULTRAM) 50 MG tablet, , Disp: , Rfl: 0 .   vitamin A 10000 UNIT capsule, Take 10,000 Units by mouth every evening., Disp: , Rfl:  .  vitamin C (ASCORBIC ACID) 500 MG tablet, Take 500 mg by mouth every evening., Disp: , Rfl: :  No Known Allergies:  Family History  Problem Relation Age of Onset  . Alcohol abuse Other   . Arthritis Other   . Hypertension Other   . Colon cancer Neg Hx   . Diabetes Neg Hx   . Cancer Mother     unsure of type  :  Social History   Social History  . Marital Status: Divorced    Spouse Name: N/A  . Number of Children: 3  . Years of Education: 16   Occupational History  . UNEMPLOYED   . Retired  Counselor   Social History Main Topics  . Smoking status: Former Smoker -- 1.00 packs/day for 10 years    Types: Cigarettes    Quit date: 10/13/1960  . Smokeless tobacco: Never Used  . Alcohol Use: No  . Drug Use: No     Comment: pt. states stopped using drugs 10/12  . Sexual Activity: Not Currently     Comment: pt. given condoms   Other Topics Concern  . Not on file   Social History Narrative   Separated x 7 years   3 daughters    Culture:mother is [place] and father hispanic   Speaks [place], [place], [place] and [place].        :  Pertinent items are noted in HPI.  Exam: Blood pressure 118/79, pulse 87, temperature 98.3 F (36.8 C), temperature source Oral, resp. rate 18, height 5\' 8"  (1.727 m), weight 164 lb 11.2 oz (74.707 kg), SpO2 99 %. General appearance: alert and cooperative Head: Normocephalic, without obvious abnormality Nose: Nares normal. Septum midline. Mucosa normal. No drainage or sinus tenderness. Throat: lips, mucosa, and tongue normal; teeth and gums normal Neck: no adenopathy Back: negative Resp: clear to auscultation bilaterally Chest wall: no tenderness Cardio: regular rate and rhythm, S1, S2 normal, no murmur, click, rub or gallop GI: soft, non-tender; bowel sounds normal; no masses,  no organomegaly Extremities: extremities normal, atraumatic, no  cyanosis or edema Pulses: 2+ and symmetric Skin: Skin color, texture, turgor normal. No rashes or lesions Lymph nodes: Cervical, supraclavicular, and axillary nodes normal.  CBC    Component Value Date/Time   WBC 10.1 06/30/2015 1447   RBC 3.77* 06/30/2015 1447   HGB 10.9* 06/30/2015 1447   HCT 33.1* 06/30/2015 1447   PLT 364.0 06/30/2015 1447   MCV 87.8 06/30/2015 1447   MCH 29.7 06/16/2015 0619   MCHC 33.0 06/30/2015 1447   RDW 14.2 06/30/2015 1447   LYMPHSABS 0.7 06/30/2015 1447   MONOABS 0.4 06/30/2015 1447   EOSABS 0.0 06/30/2015 1447   BASOSABS 0.0 06/30/2015 1447      Chemistry      Component Value Date/Time   NA 131* 06/30/2015 1447   K 4.4 06/30/2015 1447   CL 103 06/30/2015 1447   CO2 23 06/30/2015 1447   BUN 46* 06/30/2015 1447   CREATININE 1.40 06/30/2015 1447   CREATININE 1.24* 06/14/2015 1019      Component Value Date/Time   CALCIUM 8.7 06/30/2015 1447   ALKPHOS 804* 06/14/2015 1019   AST 22 06/14/2015 1019   ALT 11 06/14/2015 1019   BILITOT 0.4 06/14/2015 1019       Assessment and Plan:    75 year old gentleman with the following issues:  1. Advanced prostate cancer that is currently hormone sensitive. He was initially diagnosed in 2013 with a Gleason score 4+3 = 7 and a PSA. 5.6. He was treated with definitive therapy utilizing IMRT radiation therapy concluded in September 2013. He developed a advanced metastatic disease with bony metastasis confirmed by bone scan in November 2016 and his most recent PSA in January 2017 was 167 with testosterone of 22. He received Lupron 30 mg at that time.  The natural course of advanced prostate cancer was discussed with the patient today. He acknowledges now that he has bony metastasis and this represents incurable malignancy. He did receive hormonal therapy which appears to have palliated his symptoms relatively well. He does have some diffuse arthralgias but no severe pain. Rationale for using systemic  chemotherapy in this particular  setting was reviewed. He does have hormone sensitive disease and certainly Taxotere chemotherapy have benefited men in this particular situation. He does have rather diffuse bony metastasis and does have poor prognosis.  The logistics of administration of chemotherapy in this setting was debated although there is a benefit but I feel the complications might not justify the benefit at this time. Complications such as nausea, vomiting, myelosuppression as well as making it frequently for those infusions would be problematic for him.  Overall, the goal of therapy is palliative and I believe hormone therapy alone at this time is accomplishing this goal at this time. If his PSA starts to rise again or has a poor response, second line hormone therapy will be utilized instead of chemotherapy for better tolerance. Agent such as Nicki Reaper, and others can be used.  He will follow-up in 2 months to check on his clinical status and consider second line hormone therapy at that time.  2. Androgen depravation: received Lupron no report of January and will have that repeated in May 2017. He has tolerated it well and appears to have benefited his symptoms.  3. Bone directed therapy: He is a good candidate for Xgeva to be started in the future. We'll discuss with him further future visits.  4. HIV: He is currently on Tivicay and Descovy. Drug interaction needs to be ruled out for any future hormonal therapy prescribed.  5. Follow-up: Will be in 2 months sooner if needed to.

## 2015-08-21 DIAGNOSIS — B20 Human immunodeficiency virus [HIV] disease: Secondary | ICD-10-CM | POA: Diagnosis not present

## 2015-08-22 ENCOUNTER — Ambulatory Visit: Payer: Commercial Managed Care - HMO | Admitting: Internal Medicine

## 2015-08-22 ENCOUNTER — Other Ambulatory Visit: Payer: Self-pay | Admitting: *Deleted

## 2015-08-22 MED ORDER — TAMSULOSIN HCL 0.4 MG PO CAPS: 0.4000 mg | ORAL_CAPSULE | Freq: Two times a day (BID) | ORAL | Status: DC

## 2015-08-22 NOTE — Telephone Encounter (Signed)
Receive call pt states he is needing refills on his Flomax. Needing rx sent to [facility]...Johny Chess

## 2015-08-24 DIAGNOSIS — M25512 Pain in left shoulder: Secondary | ICD-10-CM | POA: Diagnosis not present

## 2015-08-24 DIAGNOSIS — M7541 Impingement syndrome of right shoulder: Secondary | ICD-10-CM | POA: Diagnosis not present

## 2015-08-24 DIAGNOSIS — M25511 Pain in right shoulder: Secondary | ICD-10-CM | POA: Diagnosis not present

## 2015-08-24 DIAGNOSIS — G8929 Other chronic pain: Secondary | ICD-10-CM | POA: Diagnosis not present

## 2015-08-30 ENCOUNTER — Ambulatory Visit (INDEPENDENT_AMBULATORY_CARE_PROVIDER_SITE_OTHER): Payer: Commercial Managed Care - HMO | Admitting: Internal Medicine

## 2015-08-30 ENCOUNTER — Encounter: Payer: Self-pay | Admitting: Internal Medicine

## 2015-08-30 VITALS — BP 122/80 | HR 73 | Temp 98.5°F | Resp 20 | Wt 168.0 lb

## 2015-08-30 DIAGNOSIS — I10 Essential (primary) hypertension: Secondary | ICD-10-CM | POA: Diagnosis not present

## 2015-08-30 DIAGNOSIS — J42 Unspecified chronic bronchitis: Secondary | ICD-10-CM

## 2015-08-30 DIAGNOSIS — N183 Chronic kidney disease, stage 3 unspecified: Secondary | ICD-10-CM

## 2015-08-30 DIAGNOSIS — C61 Malignant neoplasm of prostate: Secondary | ICD-10-CM

## 2015-08-30 DIAGNOSIS — C7951 Secondary malignant neoplasm of bone: Secondary | ICD-10-CM

## 2015-08-30 DIAGNOSIS — Z0000 Encounter for general adult medical examination without abnormal findings: Secondary | ICD-10-CM | POA: Diagnosis not present

## 2015-08-30 HISTORY — DX: Secondary malignant neoplasm of bone: C79.51

## 2015-08-30 HISTORY — DX: Malignant neoplasm of prostate: C61

## 2015-08-30 NOTE — Progress Notes (Signed)
Subjective:    Patient ID: Billy Lopez., male    DOB: 20-Oct-1940, 75 y.o.   MRN: [id number]  HPI  Here for wellness and f/u;  Overall doing ok;  Pt denies Chest pain, worsening SOB, DOE, wheezing, orthopnea, PND, worsening LE edema, palpitations, dizziness or syncope.  Pt denies neurological change such as new headache, facial or extremity weakness.  Pt denies polydipsia, polyuria, or low sugar symptoms. Pt states overall good compliance with treatment and medications, good tolerability, and has been trying to follow appropriate diet.  Pt denies worsening depressive symptoms, suicidal ideation or panic. No fever, night sweats, wt loss, loss of appetite, or other constitutional symptoms.  Pt states good ability with ADL's, has low fall risk, home safety reviewed and adequate, no other significant changes in hearing or vision,  Hearing aids are in the shop, [facility] today.  Here with motorized wheelchair. Uses walk at home, Pt continues to have recurring LBP without change in severity, bowel or bladder change, fever, wt loss,  worsening LE pain/numbness/weakness, gait change or falls.had recent diagnossi per urology of met prostate cancer. Also seeing murphy wainer otrho about left shoulder pain , with PT ordered for next wk.  Hydrocodone working well for pain. Past Medical History  Diagnosis Date  . Hypertension   . HIV infection ([place])   . Substance abuse   . 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 Allergic rhinitis, cause unspecified 07/21/2011  . Cervical spondylosis 07/21/2011  . Lumbar spondylosis 07/21/2011  . Osteoarthritis 07/21/2011  . COPD (chronic obstructive pulmonary disease) ([place]) 07/21/2011  . 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 Hx of radiation therapy 12/17/11 - 02/13/12    prostate  . Prostate cancer ([place]) 09/19/11    gleason 7  . H/O blood clots   . Pneumonia   . GERD (gastroesophageal reflux disease)   . Hepatitis     Hep C  . Rectal bleeding   . Wears dentures     top  . Wears  glasses   . Septic joint of left shoulder region ([place]) 06/15/2015  . CKD (chronic kidney disease) stage 3, GFR 30-59 ml/min 06/28/2015  . S/P radiation therapy 12/17/2011 through 02/13/2012    78 gray was delivered to the prostate in 40 fractions of 1.95 gray  . Prostate cancer metastatic to bone [facility]) 08/30/2015   Past Surgical History  Procedure Laterality Date  . Hip surgery      total bilateral replacement  . Hernia repair    . Hand surgery      right thumb  . Foot surgery      bilat. ingrown toenails removed  . Colonoscopy N/A 11/17/2012    Procedure: COLONOSCOPY;  Surgeon: Ladene Artist, MD;  Location: [facility];  Service: Endoscopy;  Laterality: N/A;  possible APC  . Amputation Right 03/31/2014    Procedure: RIGHT SECOND TOE AMPUTATION ;  Surgeon: Wylene Simmer, MD;  Location: [place];  Service: Orthopedics;  Laterality: Right;  . Total hip revision Left 10/08/2014    Procedure: POSTERIOR LEFT TOTAL HIP REVISION;  Surgeon: Paralee Cancel, MD;  Location: [facility];  Service: Orthopedics;  Laterality: Left;  . Shoulder arthroscopy Left 06/15/2015    Procedure: ARTHROSCOPIC SHOULDER IRRIGATION AND DEBRIDEMENT;  Surgeon: Marchia Bond, MD;  Location: [place];  Service: Orthopedics;  Laterality: Left;    reports that he quit smoking about 54 years ago. His smoking use included Cigarettes. He has a 10 pack-year smoking history. He  has never used smokeless tobacco. He reports that he does not drink alcohol or use illicit drugs. family history includes Alcohol abuse in his other; Arthritis in his other; Cancer in his mother; Hypertension in his other. There is no history of Colon cancer or Diabetes. No Known Allergies Current Outpatient Prescriptions on File Prior to Visit  Medication Sig Dispense Refill  . acetaminophen (TYLENOL) 325 MG tablet Take 2 tablets (650 mg total) by mouth every 6 (six) hours as needed for mild pain (or Fever >/= 101). 30 tablet 0  . aspirin EC 325 MG  EC tablet Take 1 tablet (325 mg total) by mouth 2 (two) times daily. 60 tablet 0  . bisacodyl (DULCOLAX) 5 MG EC tablet Take 1 tablet (5 mg total) by mouth daily as needed for moderate constipation. 30 tablet 0  . cholecalciferol (VITAMIN D) 1000 UNITS tablet Take 1,000 Units by mouth every evening.    . dolutegravir (TIVICAY) 50 MG tablet Take 1 tablet (50 mg total) by mouth daily. 30 tablet 6  . emtricitabine-tenofovir AF (DESCOVY) 200-25 MG tablet Take 1 tablet by mouth at bedtime. 30 tablet 6  . enalapril (VASOTEC) 20 MG tablet Take 20 mg by mouth daily.  0  . feeding supplement, ENSURE COMPLETE, (ENSURE COMPLETE) LIQD Take 237 mLs by mouth 2 (two) times daily between meals. 474 mL 11  . furosemide (LASIX) 20 MG tablet Take 1 tablet (20 mg total) by mouth daily. 30 tablet 11  . HYDROcodone-acetaminophen (NORCO) 10-325 MG tablet take 1/2 to 1 tablet by mouth every 8 hours if needed 90 tablet 0  . potassium chloride (K-DUR) 10 MEQ tablet Take 1 tablet (10 mEq total) by mouth daily. 90 tablet 3  . predniSONE (DELTASONE) 20 MG tablet Take 1 tablet (20 mg total) by mouth daily with breakfast. 20 tablet 1  . tamsulosin (FLOMAX) 0.4 MG CAPS capsule Take 1 capsule (0.4 mg total) by mouth 2 (two) times daily. 60 capsule 2  . traMADol (ULTRAM) 50 MG tablet   0  . vitamin A 47157 UNIT capsule Take 10,000 Units by mouth every evening.    . vitamin C (ASCORBIC ACID) 500 MG tablet Take 500 mg by mouth every evening.     No current facility-administered medications on file prior to visit.   Review of Systems Constitutional: Negative for increased diaphoresis, or other activity, appetite or siginficant weight change other than noted HENT: Negative for worsening hearing loss, ear pain, facial swelling, mouth sores and neck stiffness.   Eyes: Negative for other worsening pain, redness or visual disturbance.  Respiratory: Negative for choking or stridor Cardiovascular: Negative for other chest pain and  palpitations.  Gastrointestinal: Negative for worsening diarrhea, blood in stool, or abdominal distention Genitourinary: Negative for hematuria, flank pain or change in urine volume.  Musculoskeletal: Negative for myalgias or other joint complaints.  Skin: Negative for other color change and wound or drainage.  Neurological: Negative for syncope and numbness. other than noted Hematological: Negative for adenopathy. or other swelling Psychiatric/Behavioral: Negative for hallucinations, SI, self-injury, decreased concentration or other worsening agitation.      Objective:   Physical Exam BP 122/80 mmHg  Pulse 73  Temp(Src) 98.5 F (36.9 C) (Oral)  Resp 20  Wt 168 lb (76.204 kg)  SpO2 97% VS noted, in motorized wheelchair, thin,  Constitutional: Pt is oriented to person, place, and time, in no significant distress Head: Normocephalic and atraumatic  Eyes: Conjunctivae and EOM are normal. Pupils are equal, round,  and reactive to light Right Ear: External ear normal.  Left Ear: External ear normal Nose: Nose normal.  Mouth/Throat: Oropharynx is clear and moist  Neck: Normal range of motion. Neck supple. No JVD present. No tracheal deviation present or significant neck LA or mass Cardiovascular: Normal rate, regular rhythm, normal heart sounds and intact distal pulses.   Pulmonary/Chest: Effort normal and breath sounds without rales or wheezing  Abdominal: Soft. Bowel sounds are normal Musculoskeletal: Normal range of motion. Exhibits no edema Lymphadenopathy: Has no cervical adenopathy.  Neurological: Pt is alert and oriented to person, place, and time. Pt has normal reflexes. No cranial nerve deficit. Motor grossly intact with general weakness Skin: Skin is warm and dry. No rash noted or new ulcers Psychiatric:  Has normal mood and affect. Behavior is normal.     Assessment & Plan:

## 2015-08-30 NOTE — Progress Notes (Signed)
Pre visit review using our clinic review tool, if applicable. No additional management support is needed unless otherwise documented below in the visit note. 

## 2015-08-30 NOTE — Patient Instructions (Signed)
Please continue all other medications as before, and refills have been done if requested.  Please have the pharmacy call with any other refills you may need.  Please continue your efforts at being more active, low cholesterol diet, and weight control.  You are otherwise up to date with prevention measures today.  Please keep your appointments with your specialists as you may have planned  Please return in 6 months, or sooner if needed 

## 2015-08-31 NOTE — Assessment & Plan Note (Signed)
stable overall by history and exam, recent data reviewed with pt, and pt to continue medical treatment as before,  to f/u any worsening symptoms or concerns SpO2 Readings from Last 3 Encounters:  08/30/15 97%  08/17/15 99%  08/03/15 100%

## 2015-08-31 NOTE — Assessment & Plan Note (Addendum)
stable overall by history and exam, recent data reviewed with pt, and pt to continue medical treatment as before,  to f/u any worsening symptoms or concerns BP Readings from Last 3 Encounters:  08/30/15 122/80  08/17/15 118/79  08/03/15 75/47   In addition to the time spent performing CPE, I spent an additional 15 minutes face to face,in which greater than 50% of this time was spent in counseling and coordination of care for patient's acute illness as documented.

## 2015-08-31 NOTE — Assessment & Plan Note (Signed)

## 2015-08-31 NOTE — Assessment & Plan Note (Signed)
stable overall by history and exam, recent data reviewed with pt, and pt to continue medical treatment as before,  to f/u any worsening symptoms or concerns Lab Results  Component Value Date   CREATININE 1.40 06/30/2015

## 2015-09-05 ENCOUNTER — Telehealth: Payer: Self-pay

## 2015-09-05 DIAGNOSIS — M25611 Stiffness of right shoulder, not elsewhere classified: Secondary | ICD-10-CM | POA: Diagnosis not present

## 2015-09-05 DIAGNOSIS — M25612 Stiffness of left shoulder, not elsewhere classified: Secondary | ICD-10-CM | POA: Diagnosis not present

## 2015-09-05 DIAGNOSIS — M25512 Pain in left shoulder: Secondary | ICD-10-CM | POA: Diagnosis not present

## 2015-09-05 DIAGNOSIS — M25511 Pain in right shoulder: Secondary | ICD-10-CM | POA: Diagnosis not present

## 2015-09-05 MED ORDER — TRAMADOL HCL 50 MG PO TABS: 50.0000 mg | ORAL_TABLET | Freq: Four times a day (QID) | ORAL | Status: DC | PRN

## 2015-09-05 NOTE — Telephone Encounter (Signed)
[facility] sent rf rq for tramadol HCL 50 mg. Please advise

## 2015-09-05 NOTE — Telephone Encounter (Signed)
Done hardcopy to Corinne  

## 2015-09-05 NOTE — Addendum Note (Signed)
Addended by: Biagio Borg on: 09/05/2015 06:58 PM   Modules accepted: Orders

## 2015-09-06 NOTE — Telephone Encounter (Signed)
Medication has been sent to pharmacy.  °

## 2015-09-11 DIAGNOSIS — M25611 Stiffness of right shoulder, not elsewhere classified: Secondary | ICD-10-CM | POA: Diagnosis not present

## 2015-09-11 DIAGNOSIS — M25511 Pain in right shoulder: Secondary | ICD-10-CM | POA: Diagnosis not present

## 2015-09-11 DIAGNOSIS — M25512 Pain in left shoulder: Secondary | ICD-10-CM | POA: Diagnosis not present

## 2015-09-11 DIAGNOSIS — M25612 Stiffness of left shoulder, not elsewhere classified: Secondary | ICD-10-CM | POA: Diagnosis not present

## 2015-09-14 ENCOUNTER — Ambulatory Visit: Payer: Commercial Managed Care - HMO | Admitting: Internal Medicine

## 2015-09-15 MED FILL — DESCOVY 200-25 MG TABS: 200-25 | 30 days supply | Qty: 30 | Fill #4

## 2015-09-15 MED FILL — TIVICAY 50 MG TABLET: 50 | 30 days supply | Qty: 30 | Fill #4

## 2015-09-18 DIAGNOSIS — M25611 Stiffness of right shoulder, not elsewhere classified: Secondary | ICD-10-CM | POA: Diagnosis not present

## 2015-09-18 DIAGNOSIS — M25511 Pain in right shoulder: Secondary | ICD-10-CM | POA: Diagnosis not present

## 2015-09-18 DIAGNOSIS — M25512 Pain in left shoulder: Secondary | ICD-10-CM | POA: Diagnosis not present

## 2015-09-18 DIAGNOSIS — M25612 Stiffness of left shoulder, not elsewhere classified: Secondary | ICD-10-CM | POA: Diagnosis not present

## 2015-09-21 DIAGNOSIS — M25611 Stiffness of right shoulder, not elsewhere classified: Secondary | ICD-10-CM | POA: Diagnosis not present

## 2015-09-21 DIAGNOSIS — M25511 Pain in right shoulder: Secondary | ICD-10-CM | POA: Diagnosis not present

## 2015-09-21 DIAGNOSIS — M25512 Pain in left shoulder: Secondary | ICD-10-CM | POA: Diagnosis not present

## 2015-09-21 DIAGNOSIS — M25612 Stiffness of left shoulder, not elsewhere classified: Secondary | ICD-10-CM | POA: Diagnosis not present

## 2015-09-22 DIAGNOSIS — B20 Human immunodeficiency virus [HIV] disease: Secondary | ICD-10-CM | POA: Diagnosis not present

## 2015-09-25 ENCOUNTER — Telehealth: Payer: Self-pay | Admitting: Internal Medicine

## 2015-09-25 DIAGNOSIS — C61 Malignant neoplasm of prostate: Secondary | ICD-10-CM

## 2015-09-25 NOTE — Telephone Encounter (Signed)
Urology referral done  Please consider OV for blood in stool, or ED if persists or worsens

## 2015-09-25 NOTE — Telephone Encounter (Signed)
Please advise 

## 2015-09-25 NOTE — Telephone Encounter (Signed)
Patient is calling to request a referral to dr dahlstedt at [facility] urology. He has an appt for next month on 10/04/2015 and 10/11/2015.   He also states that he needs to get a refill of his   HYDROcodone-acetaminophen (NORCO) 10-325 MG tablet [id number]      Lastly, he states that he is experiencing blood in his stool

## 2015-09-26 DIAGNOSIS — M25612 Stiffness of left shoulder, not elsewhere classified: Secondary | ICD-10-CM | POA: Diagnosis not present

## 2015-09-26 DIAGNOSIS — M25511 Pain in right shoulder: Secondary | ICD-10-CM | POA: Diagnosis not present

## 2015-09-26 DIAGNOSIS — M25611 Stiffness of right shoulder, not elsewhere classified: Secondary | ICD-10-CM | POA: Diagnosis not present

## 2015-09-26 DIAGNOSIS — M25512 Pain in left shoulder: Secondary | ICD-10-CM | POA: Diagnosis not present

## 2015-09-26 NOTE — Telephone Encounter (Signed)
Pt called back and I informed pt of the notes. He will probably come in next week for an OV for the blood in stool. He said he would call me back to schedule  He is also following up regarding the Hydrocodone

## 2015-09-28 ENCOUNTER — Telehealth: Payer: Self-pay

## 2015-09-28 MED ORDER — HYDROCODONE-ACETAMINOPHEN 10-325 MG PO TABS: ORAL_TABLET | ORAL | Status: DC

## 2015-09-28 NOTE — Telephone Encounter (Signed)
Patient is requesting refill on hydrocodone  °

## 2015-09-28 NOTE — Telephone Encounter (Signed)
Done hardcopy to Corinne  

## 2015-09-28 NOTE — Addendum Note (Signed)
Addended by: Biagio Borg on: 09/28/2015 07:35 PM   Modules accepted: Orders

## 2015-09-29 DIAGNOSIS — M25611 Stiffness of right shoulder, not elsewhere classified: Secondary | ICD-10-CM | POA: Diagnosis not present

## 2015-09-29 DIAGNOSIS — M25511 Pain in right shoulder: Secondary | ICD-10-CM | POA: Diagnosis not present

## 2015-09-29 DIAGNOSIS — M25612 Stiffness of left shoulder, not elsewhere classified: Secondary | ICD-10-CM | POA: Diagnosis not present

## 2015-09-29 DIAGNOSIS — M25512 Pain in left shoulder: Secondary | ICD-10-CM | POA: Diagnosis not present

## 2015-09-29 NOTE — Telephone Encounter (Signed)
Medication placed up front for pick up, message left for patient.

## 2015-10-03 DIAGNOSIS — M25611 Stiffness of right shoulder, not elsewhere classified: Secondary | ICD-10-CM | POA: Diagnosis not present

## 2015-10-03 DIAGNOSIS — M25511 Pain in right shoulder: Secondary | ICD-10-CM | POA: Diagnosis not present

## 2015-10-03 DIAGNOSIS — M25512 Pain in left shoulder: Secondary | ICD-10-CM | POA: Diagnosis not present

## 2015-10-03 DIAGNOSIS — M25612 Stiffness of left shoulder, not elsewhere classified: Secondary | ICD-10-CM | POA: Diagnosis not present

## 2015-10-04 DIAGNOSIS — C61 Malignant neoplasm of prostate: Secondary | ICD-10-CM | POA: Diagnosis not present

## 2015-10-05 DIAGNOSIS — M25512 Pain in left shoulder: Secondary | ICD-10-CM | POA: Diagnosis not present

## 2015-10-05 DIAGNOSIS — M25511 Pain in right shoulder: Secondary | ICD-10-CM | POA: Diagnosis not present

## 2015-10-05 DIAGNOSIS — M25612 Stiffness of left shoulder, not elsewhere classified: Secondary | ICD-10-CM | POA: Diagnosis not present

## 2015-10-05 DIAGNOSIS — M25611 Stiffness of right shoulder, not elsewhere classified: Secondary | ICD-10-CM | POA: Diagnosis not present

## 2015-10-10 DIAGNOSIS — M25511 Pain in right shoulder: Secondary | ICD-10-CM | POA: Diagnosis not present

## 2015-10-10 DIAGNOSIS — M25612 Stiffness of left shoulder, not elsewhere classified: Secondary | ICD-10-CM | POA: Diagnosis not present

## 2015-10-10 DIAGNOSIS — M25512 Pain in left shoulder: Secondary | ICD-10-CM | POA: Diagnosis not present

## 2015-10-10 DIAGNOSIS — M25611 Stiffness of right shoulder, not elsewhere classified: Secondary | ICD-10-CM | POA: Diagnosis not present

## 2015-10-13 DIAGNOSIS — C61 Malignant neoplasm of prostate: Secondary | ICD-10-CM | POA: Diagnosis not present

## 2015-10-13 DIAGNOSIS — N320 Bladder-neck obstruction: Secondary | ICD-10-CM | POA: Diagnosis not present

## 2015-10-13 DIAGNOSIS — C7951 Secondary malignant neoplasm of bone: Secondary | ICD-10-CM | POA: Diagnosis not present

## 2015-10-16 MED FILL — DESCOVY 200-25 MG TABS: 200-25 | 30 days supply | Qty: 30 | Fill #5

## 2015-10-16 MED FILL — TIVICAY 50 MG TABLET: 50 | 30 days supply | Qty: 30 | Fill #5

## 2015-10-17 DIAGNOSIS — M25511 Pain in right shoulder: Secondary | ICD-10-CM | POA: Diagnosis not present

## 2015-10-17 DIAGNOSIS — M25611 Stiffness of right shoulder, not elsewhere classified: Secondary | ICD-10-CM | POA: Diagnosis not present

## 2015-10-17 DIAGNOSIS — M25612 Stiffness of left shoulder, not elsewhere classified: Secondary | ICD-10-CM | POA: Diagnosis not present

## 2015-10-17 DIAGNOSIS — M25512 Pain in left shoulder: Secondary | ICD-10-CM | POA: Diagnosis not present

## 2015-10-19 DIAGNOSIS — M25612 Stiffness of left shoulder, not elsewhere classified: Secondary | ICD-10-CM | POA: Diagnosis not present

## 2015-10-19 DIAGNOSIS — M25512 Pain in left shoulder: Secondary | ICD-10-CM | POA: Diagnosis not present

## 2015-10-19 DIAGNOSIS — M25611 Stiffness of right shoulder, not elsewhere classified: Secondary | ICD-10-CM | POA: Diagnosis not present

## 2015-10-19 DIAGNOSIS — M25511 Pain in right shoulder: Secondary | ICD-10-CM | POA: Diagnosis not present

## 2015-10-25 DIAGNOSIS — M25611 Stiffness of right shoulder, not elsewhere classified: Secondary | ICD-10-CM | POA: Diagnosis not present

## 2015-10-25 DIAGNOSIS — M25612 Stiffness of left shoulder, not elsewhere classified: Secondary | ICD-10-CM | POA: Diagnosis not present

## 2015-10-25 DIAGNOSIS — M25512 Pain in left shoulder: Secondary | ICD-10-CM | POA: Diagnosis not present

## 2015-10-25 DIAGNOSIS — M25511 Pain in right shoulder: Secondary | ICD-10-CM | POA: Diagnosis not present

## 2015-10-27 ENCOUNTER — Telehealth: Payer: Self-pay

## 2015-10-27 NOTE — Telephone Encounter (Signed)
Pt lm on triage. rf rq for Hydrocodone.  Please advise if rf is appropriate

## 2015-10-27 NOTE — Telephone Encounter (Signed)
I am uncomfortable with this refill as it appears that patient may be taking this on a regular basis.,  I do not treat chronic pain with narcotics  Please let me know if pt needs referral to pain clinic

## 2015-10-31 ENCOUNTER — Telehealth: Payer: Self-pay | Admitting: *Deleted

## 2015-10-31 DIAGNOSIS — M25612 Stiffness of left shoulder, not elsewhere classified: Secondary | ICD-10-CM | POA: Diagnosis not present

## 2015-10-31 DIAGNOSIS — G894 Chronic pain syndrome: Secondary | ICD-10-CM

## 2015-10-31 DIAGNOSIS — M25511 Pain in right shoulder: Secondary | ICD-10-CM | POA: Diagnosis not present

## 2015-10-31 DIAGNOSIS — B20 Human immunodeficiency virus [HIV] disease: Secondary | ICD-10-CM | POA: Diagnosis not present

## 2015-10-31 DIAGNOSIS — M25512 Pain in left shoulder: Secondary | ICD-10-CM | POA: Diagnosis not present

## 2015-10-31 DIAGNOSIS — M25611 Stiffness of right shoulder, not elsewhere classified: Secondary | ICD-10-CM | POA: Diagnosis not present

## 2015-10-31 NOTE — Telephone Encounter (Signed)
I am not comfortable with regular monthly refills of this medication, since I no longer treat chronic pain  If pt needs further medication, please ask if I can refer him to pain management.

## 2015-10-31 NOTE — Telephone Encounter (Signed)
Left msg on triage requesting refill on pain med Hydrocodone...Billy Lopez

## 2015-10-31 NOTE — Telephone Encounter (Signed)
Called patient, unable to reach, left message to give us a call back. 

## 2015-11-01 MED ORDER — HYDROCODONE-ACETAMINOPHEN 10-325 MG PO TABS: ORAL_TABLET | ORAL | Status: DC

## 2015-11-01 NOTE — Telephone Encounter (Signed)
[facility] for one month med, but I will decline any further requests, even if pt has not yet been accepted to pain clinic  I will try referral again to pain clinic  I would understand if pt wants to change PCP to another as I do not treat chronic pain  Done hardcopy to [facility]

## 2015-11-01 NOTE — Telephone Encounter (Signed)
Notified pt w/MD response pt states he asked to be referred to pain management a year ago, but never heard back from anyone. He would like to be referred to pain management, but he is asking if he can have rx for this month or until he is able to see pain management...Billy Lopez

## 2015-11-01 NOTE — Telephone Encounter (Signed)
Patient notified. Placed up front for pick-up.

## 2015-11-06 ENCOUNTER — Other Ambulatory Visit: Payer: Self-pay

## 2015-11-06 DIAGNOSIS — C61 Malignant neoplasm of prostate: Secondary | ICD-10-CM

## 2015-11-06 MED ORDER — ENALAPRIL MALEATE 20 MG PO TABS: 20.0000 mg | ORAL_TABLET | Freq: Every day | ORAL | Status: DC

## 2015-11-07 DIAGNOSIS — M25611 Stiffness of right shoulder, not elsewhere classified: Secondary | ICD-10-CM | POA: Diagnosis not present

## 2015-11-07 DIAGNOSIS — M25612 Stiffness of left shoulder, not elsewhere classified: Secondary | ICD-10-CM | POA: Diagnosis not present

## 2015-11-07 DIAGNOSIS — M25512 Pain in left shoulder: Secondary | ICD-10-CM | POA: Diagnosis not present

## 2015-11-07 DIAGNOSIS — M25511 Pain in right shoulder: Secondary | ICD-10-CM | POA: Diagnosis not present

## 2015-11-08 ENCOUNTER — Other Ambulatory Visit: Payer: Self-pay | Admitting: *Deleted

## 2015-11-08 MED ORDER — FUROSEMIDE 20 MG PO TABS: 20.0000 mg | ORAL_TABLET | Freq: Every day | ORAL | Status: DC

## 2015-11-12 ENCOUNTER — Observation Stay (HOSPITAL_COMMUNITY)
Admission: EM | Admit: 2015-11-12 | Discharge: 2015-11-15 | Disposition: A | Discharge status: Home / Self Care | Payer: Commercial Managed Care - HMO | Attending: Internal Medicine | Admitting: Internal Medicine

## 2015-11-12 ENCOUNTER — Emergency Department (HOSPITAL_COMMUNITY): Payer: Commercial Managed Care - HMO

## 2015-11-12 ENCOUNTER — Encounter (HOSPITAL_COMMUNITY): Payer: Self-pay | Admitting: Emergency Medicine

## 2015-11-12 DIAGNOSIS — I10 Essential (primary) hypertension: Secondary | ICD-10-CM | POA: Diagnosis present

## 2015-11-12 DIAGNOSIS — Z79899 Other long term (current) drug therapy: Secondary | ICD-10-CM | POA: Insufficient documentation

## 2015-11-12 DIAGNOSIS — Z96643 Presence of artificial hip joint, bilateral: Secondary | ICD-10-CM | POA: Insufficient documentation

## 2015-11-12 DIAGNOSIS — R52 Pain, unspecified: Secondary | ICD-10-CM | POA: Diagnosis not present

## 2015-11-12 DIAGNOSIS — Z7982 Long term (current) use of aspirin: Secondary | ICD-10-CM | POA: Insufficient documentation

## 2015-11-12 DIAGNOSIS — I129 Hypertensive chronic kidney disease with stage 1 through stage 4 chronic kidney disease, or unspecified chronic kidney disease: Secondary | ICD-10-CM | POA: Insufficient documentation

## 2015-11-12 DIAGNOSIS — K625 Hemorrhage of anus and rectum: Secondary | ICD-10-CM | POA: Diagnosis present

## 2015-11-12 DIAGNOSIS — Z923 Personal history of irradiation: Secondary | ICD-10-CM | POA: Diagnosis not present

## 2015-11-12 DIAGNOSIS — K5900 Constipation, unspecified: Secondary | ICD-10-CM | POA: Diagnosis not present

## 2015-11-12 DIAGNOSIS — J42 Unspecified chronic bronchitis: Secondary | ICD-10-CM

## 2015-11-12 DIAGNOSIS — N183 Chronic kidney disease, stage 3 (moderate): Secondary | ICD-10-CM | POA: Diagnosis not present

## 2015-11-12 DIAGNOSIS — J189 Pneumonia, unspecified organism: Principal | ICD-10-CM | POA: Diagnosis present

## 2015-11-12 DIAGNOSIS — Z515 Encounter for palliative care: Secondary | ICD-10-CM | POA: Diagnosis not present

## 2015-11-12 DIAGNOSIS — K922 Gastrointestinal hemorrhage, unspecified: Secondary | ICD-10-CM | POA: Diagnosis not present

## 2015-11-12 DIAGNOSIS — Z21 Asymptomatic human immunodeficiency virus [HIV] infection status: Secondary | ICD-10-CM | POA: Diagnosis not present

## 2015-11-12 DIAGNOSIS — K648 Other hemorrhoids: Secondary | ICD-10-CM | POA: Insufficient documentation

## 2015-11-12 DIAGNOSIS — C7951 Secondary malignant neoplasm of bone: Secondary | ICD-10-CM | POA: Diagnosis not present

## 2015-11-12 DIAGNOSIS — R05 Cough: Secondary | ICD-10-CM | POA: Diagnosis not present

## 2015-11-12 DIAGNOSIS — J449 Chronic obstructive pulmonary disease, unspecified: Secondary | ICD-10-CM | POA: Diagnosis present

## 2015-11-12 DIAGNOSIS — Z8546 Personal history of malignant neoplasm of prostate: Secondary | ICD-10-CM | POA: Insufficient documentation

## 2015-11-12 DIAGNOSIS — Z87891 Personal history of nicotine dependence: Secondary | ICD-10-CM | POA: Insufficient documentation

## 2015-11-12 DIAGNOSIS — R079 Chest pain, unspecified: Secondary | ICD-10-CM | POA: Diagnosis not present

## 2015-11-12 DIAGNOSIS — C61 Malignant neoplasm of prostate: Secondary | ICD-10-CM | POA: Diagnosis not present

## 2015-11-12 DIAGNOSIS — N400 Enlarged prostate without lower urinary tract symptoms: Secondary | ICD-10-CM | POA: Diagnosis not present

## 2015-11-12 LAB — CBC
HEMATOCRIT: 35.4 % — AB (ref 39.0–52.0)
Hemoglobin: 11.6 g/dL — ABNORMAL LOW (ref 13.0–17.0)
MCH: 27 pg (ref 26.0–34.0)
MCHC: 32.8 g/dL (ref 30.0–36.0)
MCV: 82.5 fL (ref 78.0–100.0)
Platelets: 224 10*3/uL (ref 150–400)
RBC: 4.29 MIL/uL (ref 4.22–5.81)
RDW: 15.6 % — AB (ref 11.5–15.5)
WBC: 6.7 10*3/uL (ref 4.0–10.5)

## 2015-11-12 LAB — BASIC METABOLIC PANEL
ANION GAP: 5 (ref 5–15)
BUN: 18 mg/dL (ref 6–20)
CALCIUM: 8.7 mg/dL — AB (ref 8.9–10.3)
CO2: 20 mmol/L — ABNORMAL LOW (ref 22–32)
Chloride: 110 mmol/L (ref 101–111)
Creatinine, Ser: 1.06 mg/dL (ref 0.61–1.24)
GFR calc Af Amer: >60 mL/min (ref >60)
GLUCOSE: 115 mg/dL — AB (ref 65–99)
POTASSIUM: 4 mmol/L (ref 3.5–5.1)
SODIUM: 135 mmol/L (ref 135–145)

## 2015-11-12 LAB — TYPE AND SCREEN
ABO/RH(D): A POS
Antibody Screen: NEGATIVE

## 2015-11-12 LAB — POC OCCULT BLOOD, ED: FECAL OCCULT BLD: POSITIVE — AB

## 2015-11-12 LAB — I-STAT TROPONIN, ED: Troponin i, poc: 0.01 ng/mL (ref 0.00–0.08)

## 2015-11-12 MED ORDER — LEVOFLOXACIN IN D5W 750 MG/150ML IV SOLN
750.0000 mg | Freq: Once | INTRAVENOUS | Status: AC
  Administered 2015-11-12: 750 mg via INTRAVENOUS
  Filled 2015-11-12: qty 150

## 2015-11-12 MED ORDER — POTASSIUM CHLORIDE ER 10 MEQ PO TBCR
10.0000 meq | EXTENDED_RELEASE_TABLET | Freq: Every day | ORAL | Status: DC
  Administered 2015-11-13 – 2015-11-15 (×3): 10 meq via ORAL
  Filled 2015-11-12 (×6): qty 1

## 2015-11-12 MED ORDER — ENALAPRIL MALEATE 20 MG PO TABS
20.0000 mg | ORAL_TABLET | Freq: Every day | ORAL | Status: DC
  Administered 2015-11-13 – 2015-11-15 (×3): 20 mg via ORAL
  Filled 2015-11-12 (×3): qty 1

## 2015-11-12 MED ORDER — IOPAMIDOL (ISOVUE-300) INJECTION 61%
80.0000 mL | Freq: Once | INTRAVENOUS | Status: AC | PRN
  Administered 2015-11-12: 80 mL via INTRAVENOUS

## 2015-11-12 MED ORDER — ENOXAPARIN SODIUM 40 MG/0.4ML ~~LOC~~ SOLN
40.0000 mg | SUBCUTANEOUS | Status: DC
  Administered 2015-11-12: 40 mg via SUBCUTANEOUS
  Filled 2015-11-12: qty 0.4

## 2015-11-12 MED ORDER — HYDROCODONE-ACETAMINOPHEN 10-325 MG PO TABS
0.5000 | ORAL_TABLET | Freq: Three times a day (TID) | ORAL | Status: DC | PRN
  Administered 2015-11-12 – 2015-11-14 (×5): 1 via ORAL
  Filled 2015-11-12 (×5): qty 1

## 2015-11-12 MED ORDER — LEVOFLOXACIN IN D5W 750 MG/150ML IV SOLN
750.0000 mg | INTRAVENOUS | Status: DC
  Administered 2015-11-13 – 2015-11-14 (×2): 750 mg via INTRAVENOUS
  Filled 2015-11-12 (×2): qty 150

## 2015-11-12 MED ORDER — IPRATROPIUM-ALBUTEROL 0.5-2.5 (3) MG/3ML IN SOLN
3.0000 mL | Freq: Three times a day (TID) | RESPIRATORY_TRACT | Status: DC
  Administered 2015-11-13 (×3): 3 mL via RESPIRATORY_TRACT
  Filled 2015-11-12 (×3): qty 3

## 2015-11-12 MED ORDER — ACETAMINOPHEN 325 MG PO TABS
650.0000 mg | ORAL_TABLET | Freq: Four times a day (QID) | ORAL | Status: DC | PRN
  Administered 2015-11-14 – 2015-11-15 (×3): 650 mg via ORAL
  Filled 2015-11-12 (×3): qty 2

## 2015-11-12 MED ORDER — IPRATROPIUM-ALBUTEROL 0.5-2.5 (3) MG/3ML IN SOLN: 3.0000 mL | RESPIRATORY_TRACT | Status: DC | PRN

## 2015-11-12 MED ORDER — TAMSULOSIN HCL 0.4 MG PO CAPS
0.4000 mg | ORAL_CAPSULE | Freq: Two times a day (BID) | ORAL | Status: DC
Start: 2015-11-12 — End: 2015-11-15
  Administered 2015-11-12 – 2015-11-15 (×6): 0.4 mg via ORAL
  Filled 2015-11-12 (×6): qty 1

## 2015-11-12 MED ORDER — SULFAMETHOXAZOLE-TRIMETHOPRIM 400-80 MG PO TABS
1.0000 | ORAL_TABLET | Freq: Once | ORAL | Status: AC
  Administered 2015-11-12: 1 via ORAL
  Filled 2015-11-12: qty 1

## 2015-11-12 MED ORDER — IPRATROPIUM-ALBUTEROL 0.5-2.5 (3) MG/3ML IN SOLN
3.0000 mL | Freq: Four times a day (QID) | RESPIRATORY_TRACT | Status: DC
  Administered 2015-11-12: 3 mL via RESPIRATORY_TRACT
  Filled 2015-11-12: qty 3

## 2015-11-12 MED ORDER — ASPIRIN EC 325 MG PO TBEC
325.0000 mg | DELAYED_RELEASE_TABLET | Freq: Two times a day (BID) | ORAL | Status: DC
  Administered 2015-11-12: 325 mg via ORAL
  Filled 2015-11-12: qty 1

## 2015-11-12 MED ORDER — FUROSEMIDE 20 MG PO TABS
20.0000 mg | ORAL_TABLET | Freq: Every day | ORAL | Status: DC
  Administered 2015-11-12 – 2015-11-13 (×2): 20 mg via ORAL
  Filled 2015-11-12 (×2): qty 1

## 2015-11-12 MED ORDER — BISACODYL 5 MG PO TBEC: 5.0000 mg | DELAYED_RELEASE_TABLET | Freq: Every day | ORAL | Status: DC | PRN

## 2015-11-12 MED ORDER — SODIUM CHLORIDE 0.9% FLUSH
3.0000 mL | Freq: Two times a day (BID) | INTRAVENOUS | Status: DC
  Administered 2015-11-12 – 2015-11-15 (×5): 3 mL via INTRAVENOUS

## 2015-11-12 MED ORDER — ACETAMINOPHEN 650 MG RE SUPP: 650.0000 mg | Freq: Four times a day (QID) | RECTAL | Status: DC | PRN

## 2015-11-12 MED ORDER — EMTRICITABINE-TENOFOVIR AF 200-25 MG PO TABS
1.0000 | ORAL_TABLET | Freq: Every day | ORAL | Status: DC
  Administered 2015-11-12 – 2015-11-14 (×3): 1 via ORAL
  Filled 2015-11-12 (×4): qty 1

## 2015-11-12 MED ORDER — DOLUTEGRAVIR SODIUM 50 MG PO TABS
50.0000 mg | ORAL_TABLET | Freq: Every day | ORAL | Status: DC
  Administered 2015-11-12 – 2015-11-14 (×3): 50 mg via ORAL
  Filled 2015-11-12 (×5): qty 1

## 2015-11-12 MED ORDER — ALBUTEROL SULFATE (2.5 MG/3ML) 0.083% IN NEBU: 2.5000 mg | INHALATION_SOLUTION | RESPIRATORY_TRACT | Status: DC | PRN

## 2015-11-12 NOTE — H&P (Signed)
History and Physical    Billy Lopez. FQ:[id number] DOB: [birth year] DOA: 11/12/2015  PCP: [name], MD  Patient coming from: home  Chief Complaint: "I just didn't feel right."  HPI: Billy Lopez. is a 75 y.o. male with medical history significant HIV, hypertension, substance abuse who presented to the emergency room with chief complaint of feeling poorly. Patient states for the last week he's been coughing. Cough has been productive of yellow-green sputum. Patient denies any hemoptysis. Patient states this is made him feel worn down. Denies fevers and chills. Positive for some nausea. Patient states the most concerning thing is that he had a bloody bowel movement today. He states the bowel movement was stool mixed with bright red blood. Patient feels that it appeared to be stirred in. Patient denies any belly pain, lightheadedness, palpitations, dizziness, presyncope and syncope.  ED Course: Patient was started on Levaquin for pneumonia. Digital rectal exam was done by ED provider that was heme positive.  Review of Systems: As per HPI otherwise 10 point review of systems negative.    Past Medical History  Diagnosis Date  . Hypertension   . HIV infection ([place])   . Substance abuse   . 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 Allergic rhinitis, cause unspecified 07/21/2011  . Cervical spondylosis 07/21/2011  . Lumbar spondylosis 07/21/2011  . Osteoarthritis 07/21/2011  . COPD (chronic obstructive pulmonary disease) ([place]) 07/21/2011  . 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 Hx of radiation therapy 12/17/11 - 02/13/12    prostate  . Prostate cancer ([place]) 09/19/11    gleason 7  . H/O blood clots   . Pneumonia   . GERD (gastroesophageal reflux disease)   . Hepatitis     Hep C  . Rectal bleeding   . Wears dentures     top  . Wears glasses   . Septic joint of left shoulder region ([place]) 06/15/2015  . CKD (chronic kidney disease) stage 3, GFR 30-59 ml/min 06/28/2015  . S/P radiation therapy  12/17/2011 through 02/13/2012    78 gray was delivered to the prostate in 40 fractions of 1.95 gray  . Prostate cancer metastatic to bone [facility]) 08/30/2015    Past Surgical History  Procedure Laterality Date  . Hip surgery      total bilateral replacement  . Hernia repair    . Hand surgery      right thumb  . Foot surgery      bilat. ingrown toenails removed  . Colonoscopy N/A 11/17/2012    Procedure: COLONOSCOPY;  Surgeon: Ladene Artist, MD;  Location: [facility];  Service: Endoscopy;  Laterality: N/A;  possible APC  . Amputation Right 03/31/2014    Procedure: RIGHT SECOND TOE AMPUTATION ;  Surgeon: Wylene Simmer, MD;  Location: [place];  Service: Orthopedics;  Laterality: Right;  . Total hip revision Left 10/08/2014    Procedure: POSTERIOR LEFT TOTAL HIP REVISION;  Surgeon: Paralee Cancel, MD;  Location: [facility];  Service: Orthopedics;  Laterality: Left;  . Shoulder arthroscopy Left 06/15/2015    Procedure: ARTHROSCOPIC SHOULDER IRRIGATION AND DEBRIDEMENT;  Surgeon: Marchia Bond, MD;  Location: [place];  Service: Orthopedics;  Laterality: Left;     reports that he quit smoking about 55 years ago. His smoking use included Cigarettes. He has a 10 pack-year smoking history. He has never used smokeless tobacco. He reports that he does not drink alcohol or use illicit drugs.  No Known Allergies  Family History  Problem Relation Age of Onset  . Alcohol abuse Other   . Arthritis Other   . Hypertension Other   . Colon cancer Neg Hx   . Diabetes Neg Hx   . Cancer Mother     unsure of type    Prior to Admission medications   Medication Sig Start Date End Date Taking? Authorizing Provider  acetaminophen (TYLENOL) 325 MG tablet Take 2 tablets (650 mg total) by mouth every 6 (six) hours as needed for mild pain (or Fever >/= 101). 10/13/14  Yes Robbie Lis, MD  aspirin EC 325 MG EC tablet Take 1 tablet (325 mg total) by mouth 2 (two) times daily. 10/13/14  Yes Robbie Lis,  MD  bisacodyl (DULCOLAX) 5 MG EC tablet Take 1 tablet (5 mg total) by mouth daily as needed for moderate constipation. 10/13/14  Yes Robbie Lis, MD  cholecalciferol (VITAMIN D) 1000 UNITS tablet Take 1,000 Units by mouth every evening.   Yes Historical Provider, MD  dolutegravir (TIVICAY) 50 MG tablet Take 1 tablet (50 mg total) by mouth daily. 05/22/15  Yes Thayer Headings, MD  emtricitabine-tenofovir AF (DESCOVY) 200-25 MG tablet Take 1 tablet by mouth at bedtime. 05/22/15  Yes Thayer Headings, MD  enalapril (VASOTEC) 20 MG tablet Take 1 tablet (20 mg total) by mouth daily. 11/06/15  Yes Biagio Borg, MD  feeding supplement, ENSURE COMPLETE, (ENSURE COMPLETE) LIQD Take 237 mLs by mouth 2 (two) times daily between meals. 04/12/15  Yes Biagio Borg, MD  furosemide (LASIX) 20 MG tablet Take 1 tablet (20 mg total) by mouth daily. 11/08/15  Yes Biagio Borg, MD  HYDROcodone-acetaminophen [facility]) 10-325 MG tablet take 1/2 to 1 tablet by mouth every 8 hours if needed Patient taking differently: Take 0.5-1 tablets by mouth every 8 (eight) hours as needed for moderate pain or severe pain.  11/01/15  Yes Biagio Borg, MD  potassium chloride (K-DUR) 10 MEQ tablet Take 1 tablet (10 mEq total) by mouth daily. 11/23/14  Yes Biagio Borg, MD  tamsulosin (FLOMAX) 0.4 MG CAPS capsule Take 1 capsule (0.4 mg total) by mouth 2 (two) times daily. 08/22/15  Yes Biagio Borg, MD  traMADol (ULTRAM) 50 MG tablet Take 1 tablet (50 mg total) by mouth every 6 (six) hours as needed. Patient taking differently: Take 50 mg by mouth every 6 (six) hours as needed for moderate pain or severe pain.  09/05/15  Yes Biagio Borg, MD  vitamin A 10000 UNIT capsule Take 10,000 Units by mouth every evening.   Yes Historical Provider, MD  vitamin C (ASCORBIC ACID) 500 MG tablet Take 500 mg by mouth every evening.   Yes Historical Provider, MD  predniSONE (DELTASONE) 20 MG tablet Take 1 tablet (20 mg total) by mouth daily with breakfast. Patient not  taking: Reported on 11/12/2015 06/28/15   Truman Hayward, MD    Physical Exam: Filed Vitals:   11/12/15 1633 11/12/15 1925  BP: 140/93 131/95  Pulse: 82 68  Temp: 98.2 F (36.8 C)   TempSrc: Oral   Resp: 18 18  SpO2: 100% 97%      Constitutional: NAD, calm, comfortable Filed Vitals:   11/12/15 1633 11/12/15 1925  BP: 140/93 131/95  Pulse: 82 68  Temp: 98.2 F (36.8 C)   TempSrc: Oral   Resp: 18 18  SpO2: 100% 97%   Eyes: PERRL, lids and conjunctivae normal ENMT: Mucous membranes are moist. Posterior pharynx clear  of any exudate or lesions.Normal dentition.  Neck: normal, supple, no masses, no thyromegaly Respiratory: clear to auscultation bilaterally, no wheezing, no crackles. Normal respiratory effort. No accessory muscle use.  Cardiovascular: Regular rate and rhythm, no murmurs / rubs / gallops. No extremity edema. 2+ pedal pulses. No carotid bruits.  Abdomen: no tenderness, no masses palpated. No hepatosplenomegaly. Bowel sounds positive.  Musculoskeletal: no clubbing / cyanosis. No joint deformity upper and lower extremities. Good ROM, no contractures. Normal muscle tone.  Skin: no rashes, lesions, ulcers. No induration Neurologic: CN 2-12 grossly intact. Sensation intact, DTR normal. Strength 5/5 in all 4.  Psychiatric: Normal judgment and insight. Alert and oriented x 3. Normal mood.   Labs on Admission: I have personally reviewed following labs and imaging studies  CBC:  Recent Labs Lab 11/12/15 1734  WBC 6.7  HGB 11.6*  HCT 35.4*  MCV 82.5  PLT [id number]   Basic Metabolic Panel:  Recent Labs Lab 11/12/15 1734  NA 135  K 4.0  CL 110  CO2 20*  GLUCOSE 115*  BUN 18  CREATININE 1.06  CALCIUM 8.7*   GFR: CrCl cannot be calculated (Unknown ideal weight.). Liver Function Tests: No results for input(s): AST, ALT, ALKPHOS, BILITOT, PROT, ALBUMIN in the last 168 hours. No results for input(s): LIPASE, AMYLASE in the last 168 hours. No results for  input(s): AMMONIA in the last 168 hours. Coagulation Profile: No results for input(s): INR, PROTIME in the last 168 hours. Cardiac Enzymes: No results for input(s): CKTOTAL, CKMB, CKMBINDEX, TROPONINI in the last 168 hours. BNP (last 3 results) No results for input(s): PROBNP in the last 8760 hours. HbA1C: No results for input(s): HGBA1C in the last 72 hours. CBG: No results for input(s): GLUCAP in the last 168 hours. Lipid Profile: No results for input(s): CHOL, HDL, LDLCALC, TRIG, CHOLHDL, LDLDIRECT in the last 72 hours. Thyroid Function Tests: No results for input(s): TSH, T4TOTAL, FREET4, T3FREE, THYROIDAB in the last 72 hours. Anemia Panel: No results for input(s): VITAMINB12, FOLATE, FERRITIN, TIBC, IRON, RETICCTPCT in the last 72 hours. Urine analysis:    Component Value Date/Time   COLORURINE YELLOW 06/14/2015 1113   APPEARANCEUR CLEAR 06/14/2015 1113   LABSPEC 1.007 06/14/2015 1113   PHURINE 5.5 06/14/2015 1113   GLUCOSEU NEGATIVE 06/14/2015 1113   GLUCOSEU NEGATIVE 02/22/2015 1118   HGBUR NEGATIVE 06/14/2015 1113   BILIRUBINUR NEGATIVE 06/14/2015 1113   KETONESUR NEGATIVE 06/14/2015 1113   PROTEINUR NEGATIVE 06/14/2015 1113   UROBILINOGEN 0.2 02/22/2015 1118   NITRITE NEGATIVE 06/14/2015 1113   LEUKOCYTESUR NEGATIVE 06/14/2015 1113   Sepsis Labs: !!!!!!!!!!!!!!!!!!!!!!!!!!!!!!!!!!!!!!!!!!!! @LABRCNTIP (procalcitonin:4,lacticidven:4) )No results found for this or any previous visit (from the past 240 hour(s)).   Radiological Exams on Admission: Dg Chest 2 View  11/12/2015  CLINICAL DATA:  Pt c/o chest pain, cough, and dark red GI bleeding. Pain worse with cough, palpation, inspiration, but also present when patient sits still without cough. EXAM: CHEST  2 VIEW COMPARISON:  06/14/2015 radiograph, 516 CT thorax FINDINGS: New consolidation and costophrenic angle blunting right lung base. Heart size and vascular pattern normal. Left lung clear. Chronic rotator cuff tears.  IMPRESSION: Right lower lobe consolidation and small effusion. Differential includes pneumonia and mass. Eventration of the diaphragm also possible. Consider CT thorax with contrast. Electronically Signed   By: Skipper Cliche M.D.   On: 11/12/2015 16:58   Ct Chest W Contrast  11/12/2015  CLINICAL DATA:  Chest pain, cough. GI bleed. Prostate cancer with osseous metastatic disease. EXAM:  CT CHEST WITH CONTRAST TECHNIQUE: Multidetector CT imaging of the chest was performed during intravenous contrast administration. CONTRAST:  59mL ISOVUE-300 IOPAMIDOL (ISOVUE-300) INJECTION 61% COMPARISON:  Multiple exams, including 11/12/2015 chest radiograph and prior chest CT from 10/09/2014 FINDINGS: Mediastinum/Nodes: Prevascular lymph node 0.7 cm on image 25/2, formerly the same. No pathologic thoracic adenopathy identified. No specific esophageal abnormality observed. Lungs/Pleura: Mild biapical pleural parenchymal scarring. Emphysema. Herniations of both posterior hemidiaphragms containing adipose tissue, similar to the prior chest CT. There is adjacent atelectasis along with some faint ground-glass opacity in the right lower lobe which could reflect early pneumonia. Trace right pleural effusion. Small amount of fluid posteriorly in the minor fissure. No worrisome lung mass. Upper abdomen: Unremarkable Musculoskeletal: Fatty atrophy of the much of the rotator cuff musculature bilaterally. Degenerative sternoclavicular arthropathy, right greater than left. Diffuse osseous metastatic disease in the thorax involving all bony structures. This represents a significant increase from 10/09/2014 and even appears increased compared to bone scan of 04/13/2015 IMPRESSION: 1. Progressive diffuse osseous metastatic disease, significantly worsening over the past 7 months. All bony structures in the thorax are affected. 2. No lung mass is identified. There herniations of both posterior hemidiaphragms containing adipose tissue. 3. In the  right lower lobe adjacent to the herniation, there is some atelectasis and potentially some early pneumonia. 4. Emphysema. 5. Fatty atrophy of much of the rotator cuff musculature bilaterally. Electronically Signed   By: Van Clines M.D.   On: 11/12/2015 19:32    EKG: pending  Assessment/Plan Principal Problem:   CAP (community acquired pneumonia) Active Problems:   Essential hypertension   COPD (chronic obstructive pulmonary disease) (HCC)   Constipation   BRBPR (bright red blood per rectum)   Community acquired pneumonia Albuterol when necessary every 4 shortness of breath DuoNeb 3 times a day Levaquin Started in the emergency room Sputum culture ordered IS  Bright red blood per rectum Hemoglobin 11.6 on admission, close to baseline Holding aspirin Serial H&H  HIV Cont Tivicay & Descovy Based on last CD4 count 1 time dose of Bactrim double strength  Hypertension Continue Vasotec's 20 mg daily  BPH Continue Flomax 0.4 mg twice a day   DVT prophylaxis: SCD Code Status: full Family Communication: Unavailable Disposition Plan: tbd Consults called: none Admission status: tele obs   Elwin Mocha MD [facility] Pager [phone]  If 7PM-7AM, please contact night-coverage [email or web address] Password [email or web address]  11/12/2015, 8:25 PM

## 2015-11-12 NOTE — ED Notes (Signed)
Pt c/o chest pain, cough, and dark red GI bleeding. Pain worse with cough, palpation, inspiration, but also present when patient sits still without cough.

## 2015-11-12 NOTE — ED Provider Notes (Signed)
CSN: [id number]     Arrival date & time 11/12/15  1624 History   First MD Initiated Contact with Patient 11/12/15 1746     Chief Complaint  Patient presents with  . Cough  . GI Bleeding  . Chest Pain     (Consider location/radiation/quality/duration/timing/severity/associated sxs/prior Treatment) HPI Patient presents with cough productive of yellow sputum for one week. Associated with chest pain and thoracic back pain when coughing. No shortness of breath. Has had diaphoresis but denies fever or chills. Continues to have bilateral lower extremity swelling. Patient also complains of episodic gross gastric intestinal bleeding. Described as red blood mixed in with stool. This been ongoing since last year. States he had another bloody bowel movement this afternoon. He is on no blood thinners. Denies abdominal pain, lightheadedness or dizziness. Past Medical History  Diagnosis Date  . Hypertension   . HIV infection ([place])   . Substance abuse   . 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 Allergic rhinitis, cause unspecified 07/21/2011  . Cervical spondylosis 07/21/2011  . Lumbar spondylosis 07/21/2011  . Osteoarthritis 07/21/2011  . COPD (chronic obstructive pulmonary disease) ([place]) 07/21/2011  . 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 Hx of radiation therapy 12/17/11 - 02/13/12    prostate  . Prostate cancer ([place]) 09/19/11    gleason 7  . H/O blood clots   . Pneumonia   . GERD (gastroesophageal reflux disease)   . Hepatitis     Hep C  . Rectal bleeding   . Wears dentures     top  . Wears glasses   . Septic joint of left shoulder region ([place]) 06/15/2015  . CKD (chronic kidney disease) stage 3, GFR 30-59 ml/min 06/28/2015  . S/P radiation therapy 12/17/2011 through 02/13/2012    78 gray was delivered to the prostate in 40 fractions of 1.95 gray  . Prostate cancer metastatic to bone [facility]) 08/30/2015   Past Surgical History  Procedure Laterality Date  . Hip surgery      total bilateral replacement   . Hernia repair    . Hand surgery      right thumb  . Foot surgery      bilat. ingrown toenails removed  . Colonoscopy N/A 11/17/2012    Procedure: COLONOSCOPY;  Surgeon: Ladene Artist, MD;  Location: [facility];  Service: Endoscopy;  Laterality: N/A;  possible APC  . Amputation Right 03/31/2014    Procedure: RIGHT SECOND TOE AMPUTATION ;  Surgeon: Wylene Simmer, MD;  Location: [facility];  Service: Orthopedics;  Laterality: Right;  . Total hip revision Left 10/08/2014    Procedure: POSTERIOR LEFT TOTAL HIP REVISION;  Surgeon: Paralee Cancel, MD;  Location: [facility];  Service: Orthopedics;  Laterality: Left;  . Shoulder arthroscopy Left 06/15/2015    Procedure: ARTHROSCOPIC SHOULDER IRRIGATION AND DEBRIDEMENT;  Surgeon: Marchia Bond, MD;  Location: [place];  Service: Orthopedics;  Laterality: Left;   Family History  Problem Relation Age of Onset  . Alcohol abuse Other   . Arthritis Other   . Hypertension Other   . Colon cancer Neg Hx   . Diabetes Neg Hx   . Cancer Mother     unsure of type   Social History  Substance Use Topics  . Smoking status: Former Smoker -- 1.00 packs/day for 10 years    Types: Cigarettes    Quit date: 10/13/1960  . Smokeless tobacco: Never Used  . Alcohol Use: No    Review of Systems  Constitutional: Positive  for diaphoresis. Negative for fever and chills.  Respiratory: Positive for cough. Negative for shortness of breath.   Cardiovascular: Positive for chest pain and leg swelling. Negative for palpitations.  Gastrointestinal: Positive for blood in stool. Negative for nausea, vomiting, abdominal pain, diarrhea and constipation.  Genitourinary: Negative for dysuria, hematuria and flank pain.  Musculoskeletal: Positive for back pain and arthralgias. Negative for myalgias, neck pain and neck stiffness.  Skin: Negative for rash and wound.  Neurological: Negative for dizziness, syncope, weakness, light-headedness, numbness and headaches.  All  other systems reviewed and are negative.     Allergies  Review of patient's allergies indicates no known allergies.  Home Medications   Prior to Admission medications   Medication Sig Start Date End Date Taking? Authorizing Provider  acetaminophen (TYLENOL) 325 MG tablet Take 2 tablets (650 mg total) by mouth every 6 (six) hours as needed for mild pain (or Fever >/= 101). 10/13/14  Yes Robbie Lis, MD  aspirin EC 325 MG EC tablet Take 1 tablet (325 mg total) by mouth 2 (two) times daily. 10/13/14  Yes Robbie Lis, MD  bisacodyl (DULCOLAX) 5 MG EC tablet Take 1 tablet (5 mg total) by mouth daily as needed for moderate constipation. 10/13/14  Yes Robbie Lis, MD  cholecalciferol (VITAMIN D) 1000 UNITS tablet Take 1,000 Units by mouth every evening.   Yes Historical Provider, MD  dolutegravir (TIVICAY) 50 MG tablet Take 1 tablet (50 mg total) by mouth daily. 05/22/15  Yes Thayer Headings, MD  emtricitabine-tenofovir AF (DESCOVY) 200-25 MG tablet Take 1 tablet by mouth at bedtime. 05/22/15  Yes Thayer Headings, MD  enalapril (VASOTEC) 20 MG tablet Take 1 tablet (20 mg total) by mouth daily. 11/06/15  Yes Biagio Borg, MD  feeding supplement, ENSURE COMPLETE, (ENSURE COMPLETE) LIQD Take 237 mLs by mouth 2 (two) times daily between meals. 04/12/15  Yes Biagio Borg, MD  furosemide (LASIX) 20 MG tablet Take 1 tablet (20 mg total) by mouth daily. 11/08/15  Yes Biagio Borg, MD  HYDROcodone-acetaminophen [facility]) 10-325 MG tablet take 1/2 to 1 tablet by mouth every 8 hours if needed Patient taking differently: Take 0.5-1 tablets by mouth every 8 (eight) hours as needed for moderate pain or severe pain.  11/01/15  Yes Biagio Borg, MD  potassium chloride (K-DUR) 10 MEQ tablet Take 1 tablet (10 mEq total) by mouth daily. 11/23/14  Yes Biagio Borg, MD  tamsulosin (FLOMAX) 0.4 MG CAPS capsule Take 1 capsule (0.4 mg total) by mouth 2 (two) times daily. 08/22/15  Yes Biagio Borg, MD  traMADol (ULTRAM) 50 MG  tablet Take 1 tablet (50 mg total) by mouth every 6 (six) hours as needed. Patient taking differently: Take 50 mg by mouth every 6 (six) hours as needed for moderate pain or severe pain.  09/05/15  Yes Biagio Borg, MD  vitamin A 10000 UNIT capsule Take 10,000 Units by mouth every evening.   Yes Historical Provider, MD  vitamin C (ASCORBIC ACID) 500 MG tablet Take 500 mg by mouth every evening.   Yes Historical Provider, MD  predniSONE (DELTASONE) 20 MG tablet Take 1 tablet (20 mg total) by mouth daily with breakfast. Patient not taking: Reported on 11/12/2015 06/28/15   Truman Hayward, MD   BP 131/95 mmHg  Pulse 68  Temp(Src) 98.2 F (36.8 C) (Oral)  Resp 18  SpO2 97% Physical Exam  Constitutional: He is oriented to person, place, and  time. He appears well-developed and well-nourished. No distress.  HENT:  Head: Normocephalic and atraumatic.  Mouth/Throat: Oropharynx is clear and moist.  Eyes: EOM are normal. Pupils are equal, round, and reactive to light.  Neck: Normal range of motion. Neck supple. No JVD present.  Cardiovascular: Normal rate and regular rhythm.   Pulmonary/Chest: Effort normal. No respiratory distress. He has no wheezes. He has no rales.  Decreased breath sounds bl bases  Abdominal: Soft. Bowel sounds are normal. He exhibits no distension and no mass. There is no tenderness. There is no rebound and no guarding.  Genitourinary:  Rectal vault is empty. No obvious masses or tenderness. No gross blood on exam. Hemoccult positive.  Musculoskeletal: Normal range of motion. He exhibits edema. He exhibits no tenderness.  2+ bilateral lower extremity pitting edema. Distal pulses are intact  Neurological: He is alert and oriented to person, place, and time.  Moves all extremities without deficit. Sensation is fully intact.  Skin: Skin is warm and dry. No rash noted. No erythema.  Psychiatric: He has a normal mood and affect. His behavior is normal.  Nursing note and vitals  reviewed.   ED Course  Procedures (including critical care time) Labs Review Labs Reviewed  BASIC METABOLIC PANEL - Abnormal; Notable for the following:    CO2 20 (*)    Glucose, Bld 115 (*)    Calcium 8.7 (*)    All other components within normal limits  CBC - Abnormal; Notable for the following:    Hemoglobin 11.6 (*)    HCT 35.4 (*)    RDW 15.6 (*)    All other components within normal limits  POC OCCULT BLOOD, ED - Abnormal; Notable for the following:    Fecal Occult Bld POSITIVE (*)    All other components within normal limits  I-STAT TROPOININ, ED  TYPE AND SCREEN    Imaging Review Dg Chest 2 View  11/12/2015  CLINICAL DATA:  Pt c/o chest pain, cough, and dark red GI bleeding. Pain worse with cough, palpation, inspiration, but also present when patient sits still without cough. EXAM: CHEST  2 VIEW COMPARISON:  06/14/2015 radiograph, 516 CT thorax FINDINGS: New consolidation and costophrenic angle blunting right lung base. Heart size and vascular pattern normal. Left lung clear. Chronic rotator cuff tears. IMPRESSION: Right lower lobe consolidation and small effusion. Differential includes pneumonia and mass. Eventration of the diaphragm also possible. Consider CT thorax with contrast. Electronically Signed   By: Skipper Cliche M.D.   On: 11/12/2015 16:58   Ct Chest W Contrast  11/12/2015  CLINICAL DATA:  Chest pain, cough. GI bleed. Prostate cancer with osseous metastatic disease. EXAM: CT CHEST WITH CONTRAST TECHNIQUE: Multidetector CT imaging of the chest was performed during intravenous contrast administration. CONTRAST:  57mL ISOVUE-300 IOPAMIDOL (ISOVUE-300) INJECTION 61% COMPARISON:  Multiple exams, including 11/12/2015 chest radiograph and prior chest CT from 10/09/2014 FINDINGS: Mediastinum/Nodes: Prevascular lymph node 0.7 cm on image 25/2, formerly the same. No pathologic thoracic adenopathy identified. No specific esophageal abnormality observed. Lungs/Pleura: Mild  biapical pleural parenchymal scarring. Emphysema. Herniations of both posterior hemidiaphragms containing adipose tissue, similar to the prior chest CT. There is adjacent atelectasis along with some faint ground-glass opacity in the right lower lobe which could reflect early pneumonia. Trace right pleural effusion. Small amount of fluid posteriorly in the minor fissure. No worrisome lung mass. Upper abdomen: Unremarkable Musculoskeletal: Fatty atrophy of the much of the rotator cuff musculature bilaterally. Degenerative sternoclavicular arthropathy, right greater than left.  Diffuse osseous metastatic disease in the thorax involving all bony structures. This represents a significant increase from 10/09/2014 and even appears increased compared to bone scan of 04/13/2015 IMPRESSION: 1. Progressive diffuse osseous metastatic disease, significantly worsening over the past 7 months. All bony structures in the thorax are affected. 2. No lung mass is identified. There herniations of both posterior hemidiaphragms containing adipose tissue. 3. In the right lower lobe adjacent to the herniation, there is some atelectasis and potentially some early pneumonia. 4. Emphysema. 5. Fatty atrophy of much of the rotator cuff musculature bilaterally. Electronically Signed   By: Van Clines M.D.   On: 11/12/2015 19:32   I have personally reviewed and evaluated these images and lab results as part of my medical decision-making.   EKG Interpretation   Date/Time:  Sunday November 12 2015 16:35:02 EDT Ventricular Rate:  79 PR Interval:  135 QRS Duration: 82 QT Interval:  372 QTC Calculation: 426 R Axis:   29 Text Interpretation:  Sinus rhythm Probable left atrial enlargement  Nonspecific T abnormalities, lateral leads Confirmed by Lita Mains  MD,  Walaa Carel ([id number]) on 11/12/2015 8:16:33 PM      MDM   Final diagnoses:  CAP (community acquired pneumonia)  Lower GI bleed    Vital signs are stable in the emergency  department. Patient has a stable hemoglobin. CT with progressive metastatic disease and right basilar pneumonia. Initiated antibiotics in the emergency department. Discussed with [facility] hospitalists and will admit.  Julianne Rice, MD 11/12/15 2016

## 2015-11-13 ENCOUNTER — Encounter (HOSPITAL_COMMUNITY): Payer: Self-pay | Admitting: *Deleted

## 2015-11-13 DIAGNOSIS — K625 Hemorrhage of anus and rectum: Secondary | ICD-10-CM | POA: Diagnosis not present

## 2015-11-13 DIAGNOSIS — J189 Pneumonia, unspecified organism: Secondary | ICD-10-CM | POA: Diagnosis not present

## 2015-11-13 DIAGNOSIS — I10 Essential (primary) hypertension: Secondary | ICD-10-CM | POA: Diagnosis not present

## 2015-11-13 LAB — CBC
HEMATOCRIT: 34 % — AB (ref 39.0–52.0)
Hemoglobin: 11.1 g/dL — ABNORMAL LOW (ref 13.0–17.0)
MCH: 26.9 pg (ref 26.0–34.0)
MCHC: 32.6 g/dL (ref 30.0–36.0)
MCV: 82.5 fL (ref 78.0–100.0)
PLATELETS: 216 10*3/uL (ref 150–400)
RBC: 4.12 MIL/uL — AB (ref 4.22–5.81)
RDW: 15.7 % — ABNORMAL HIGH (ref 11.5–15.5)
WBC: 6.1 10*3/uL (ref 4.0–10.5)

## 2015-11-13 LAB — BASIC METABOLIC PANEL
Anion gap: 5 (ref 5–15)
BUN: 19 mg/dL (ref 6–20)
CHLORIDE: 111 mmol/L (ref 101–111)
CO2: 19 mmol/L — AB (ref 22–32)
CREATININE: 1.11 mg/dL (ref 0.61–1.24)
Calcium: 8.6 mg/dL — ABNORMAL LOW (ref 8.9–10.3)
GFR calc Af Amer: >60 mL/min (ref >60)
GFR calc non Af Amer: >60 mL/min (ref >60)
Glucose, Bld: 104 mg/dL — ABNORMAL HIGH (ref 65–99)
POTASSIUM: 4.1 mmol/L (ref 3.5–5.1)
Sodium: 135 mmol/L (ref 135–145)

## 2015-11-13 MED ORDER — KCL IN DEXTROSE-NACL 20-5-0.45 MEQ/L-%-% IV SOLN
INTRAVENOUS | Status: DC
  Administered 2015-11-13: 13:00:00 via INTRAVENOUS
  Administered 2015-11-13: 125 mL/h via INTRAVENOUS
  Filled 2015-11-13 (×2): qty 1000

## 2015-11-13 MED ORDER — IPRATROPIUM-ALBUTEROL 0.5-2.5 (3) MG/3ML IN SOLN
3.0000 mL | Freq: Two times a day (BID) | RESPIRATORY_TRACT | Status: DC
  Administered 2015-11-14 – 2015-11-15 (×3): 3 mL via RESPIRATORY_TRACT
  Filled 2015-11-13 (×3): qty 3

## 2015-11-13 MED ORDER — MORPHINE SULFATE (PF) 2 MG/ML IV SOLN
2.0000 mg | INTRAVENOUS | Status: DC | PRN
  Administered 2015-11-13 – 2015-11-14 (×5): 2 mg via INTRAVENOUS
  Filled 2015-11-13 (×5): qty 1

## 2015-11-13 NOTE — Progress Notes (Addendum)
PROGRESS NOTE    [name]  [id number] DOB: 06-06-1940 DOA: 11/12/2015 PCP: [name], MD      Brief Narrative:  Mr Rubi is a 75 year old African American male who presented to the ED with a week history of productive cough and a few day history of bright red blood mixed with stool. 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 Patient stated he has been coughing and slightly nauseas and "just isn't feeling right" for the past week. He denied hemoptysis, fevers and chills. He also reports a few day history of bright red blood in stool which started after experiencing constipation and noted increased straining during BM. His last colonoscopy was in 2014 with internal hemorrhoids and radiation proctitis. He was started on Levaquin for CAP in the ED. Hgb of 11.6 at admission, close to baseline. He had 1 dose of Bactrim as prophylaxis for a CD4 count of 130.    Assessment & Plan:   Principal Problem:   CAP (community acquired pneumonia) Active Problems:   Essential hypertension   COPD (chronic obstructive pulmonary disease) (HCC)   Constipation   BRBPR (bright red blood per rectum)   1. CAP - Patient reports less coughing today, although still productive but unable to spit out sputum - To obtain sputum culture if available - Continue Levaquin 750mg  IV Qdaily - Continue albuterol Q4H PRN for SOB and Duoneb TID - Currently no clinical suspicion of PCP pneumonia with CXR and CT. Received 1 dose of Bactrim prophylaxis  - Repeat CXR tomorrow  2. BRBPR - Association with constipation, continue Dulcolax for constipation - Last colonoscopy 11/17/2012 with internal hemorrhoids and radiation proctitis, colon cancer screen negative - Hgb on admission 11.6, repeat Hgb at 11.1. Continue to monitor for now - Continue to hold aspirin for now - Repeat H/H  3. Pain management - Patient complaining of worsening  pain, possibly due to metastatic bone disease. Patient is vague on description of pain, but feels that his home dose of Hydrocodone-acetaminophen 10-325mg  is not controlling pain well. - Continue Hydrocodone-acetaminophen 10-325mg  0.5-1 tab Q8H PRN and start Morphine 2mg  IV Q4H PRN.  - Discussed with patient of need for PO management alone for chronic pain, discussed possibility of palliative care consult for better control of pain.   4. 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 Goal of therapy is palliative with hormone therapy alone. Does not recommend chemotherapy  5. HIV - Last CD4 count at 130 on 06/14/2015 with HIV viral < 20 copies/mL - Patient sees Dr Scharlene Gloss for HIV follow up - Continue Descovy and Tivicay  6. Hypertension - Continue Enalapril 20mg  PO Qdaily    DVT prophylaxis: SCD's Code Status: Full Family Communication: None at bedside Disposition Plan: TBD   Consultants:   None  Procedures:   None  Antimicrobials: (specify start and planned stop date. Auto populated tables are space occupying and do not give end dates)  Levaquin  Bactrim   Subjective: Productive cough improved. States feels that he needs to cough up sputum but not able to. He is complaining of general  pain of his bones, with exacerbation when coughing.   Objective: Filed Vitals:   11/12/15 2152 11/12/15 2209 11/13/15 0557 11/13/15 0859  BP:   144/97   Pulse:   58   Temp:   99.5 F (37.5 C)   TempSrc:   Oral   Resp:   18   Height: 5\' 7"  (1.702 m)     Weight: 74.2 kg (163 lb 9.3 oz)     SpO2:  97% 100% 95%    Intake/Output Summary (Last  24 hours) at 11/13/15 1052 Last data filed at 11/13/15 0600  Gross per 24 hour  Intake 1232.92 ml  Output   1650 ml  Net -417.08 ml   Filed Weights   11/12/15 2152  Weight: 74.2 kg (163 lb 9.3 oz)    Examination:  General exam: Appears calm and comfortable, in no acute distress Respiratory system: Minimal crackles. Respiratory effort normal. Cardiovascular system: S1 & S2 heard, RRR. No JVD, murmurs, rubs, gallops or clicks. No pedal edema. Gastrointestinal system: Abdomen is nondistended, soft and nontender. No organomegaly or masses felt. Normal bowel sounds heard. Central nervous system: Alert and oriented. No focal neurological deficits. Extremities: Symmetric 5 x 5 power. Skin: No rashes, lesions or ulcers Psychiatry: Judgement and insight appear normal. Mood & affect appropriate.     Data Reviewed: I have personally reviewed following labs and imaging studies  CBC:  Recent Labs Lab 11/12/15 1734 11/13/15 0220  WBC 6.7 6.1  HGB 11.6* 11.1*  HCT 35.4* 34.0*  MCV 82.5 82.5  PLT 224 [id number]   Basic Metabolic Panel:  Recent Labs Lab 11/12/15 1734 11/13/15 0220  NA 135 135  K 4.0 4.1  CL 110 111  CO2 20* 19*  GLUCOSE 115* 104*  BUN 18 19  CREATININE 1.06 1.11  CALCIUM 8.7* 8.6*   GFR: Estimated Creatinine Clearance: 55.4 mL/min (by C-G formula based on Cr of 1.11). Liver Function Tests: No results for input(s): AST, ALT, ALKPHOS, BILITOT, PROT, ALBUMIN in the last 168 hours. No results for input(s): LIPASE, AMYLASE in the last 168 hours. No results for input(s): AMMONIA in the last 168 hours. Coagulation Profile: No results for input(s): INR, PROTIME in the last 168 hours. Cardiac Enzymes: No results for input(s): CKTOTAL, CKMB, CKMBINDEX, TROPONINI in the last 168 hours. BNP (last 3 results) No results for input(s): PROBNP in the last 8760 hours. HbA1C: No results for input(s): HGBA1C in the last 72 hours. CBG: No results for input(s): GLUCAP in the  last 168 hours. Lipid Profile: No results for input(s): CHOL, HDL, LDLCALC, TRIG, CHOLHDL, LDLDIRECT in the last 72 hours. Thyroid Function Tests: No results for input(s): TSH, T4TOTAL, FREET4, T3FREE, THYROIDAB in the last 72 hours. Anemia Panel: No results for input(s): VITAMINB12, FOLATE, FERRITIN, TIBC, IRON, RETICCTPCT in the last 72 hours. Urine analysis:    Component Value Date/Time   COLORURINE YELLOW 06/14/2015 1113   APPEARANCEUR CLEAR 06/14/2015 1113   LABSPEC 1.007 06/14/2015 1113   PHURINE 5.5 06/14/2015 1113   GLUCOSEU NEGATIVE 06/14/2015 1113   GLUCOSEU NEGATIVE 02/22/2015 1118   HGBUR NEGATIVE 06/14/2015 1113   BILIRUBINUR NEGATIVE 06/14/2015 1113   KETONESUR NEGATIVE 06/14/2015 1113   PROTEINUR NEGATIVE 06/14/2015 1113   UROBILINOGEN 0.2 02/22/2015 1118   NITRITE NEGATIVE 06/14/2015 1113   LEUKOCYTESUR NEGATIVE 06/14/2015 1113   Sepsis Labs: @LABRCNTIP (procalcitonin:4,lacticidven:4)  )No results found for this or any previous visit (from the past 240 hour(s)).       Radiology Studies: Dg Chest  2 View  11/12/2015  CLINICAL DATA:  Pt c/o chest pain, cough, and dark red GI bleeding. Pain worse with cough, palpation, inspiration, but also present when patient sits still without cough. EXAM: CHEST  2 VIEW COMPARISON:  06/14/2015 radiograph, 516 CT thorax FINDINGS: New consolidation and costophrenic angle blunting right lung base. Heart size and vascular pattern normal. Left lung clear. Chronic rotator cuff tears. IMPRESSION: Right lower lobe consolidation and small effusion. Differential includes pneumonia and mass. Eventration of the diaphragm also possible. Consider CT thorax with contrast. Electronically Signed   By: Skipper Cliche M.D.   On: 11/12/2015 16:58   Ct Chest W Contrast  11/12/2015  CLINICAL DATA:  Chest pain, cough. GI bleed. Prostate cancer with osseous metastatic disease. EXAM: CT CHEST WITH CONTRAST TECHNIQUE: Multidetector CT imaging of the chest  was performed during intravenous contrast administration. CONTRAST:  22mL ISOVUE-300 IOPAMIDOL (ISOVUE-300) INJECTION 61% COMPARISON:  Multiple exams, including 11/12/2015 chest radiograph and prior chest CT from 10/09/2014 FINDINGS: Mediastinum/Nodes: Prevascular lymph node 0.7 cm on image 25/2, formerly the same. No pathologic thoracic adenopathy identified. No specific esophageal abnormality observed. Lungs/Pleura: Mild biapical pleural parenchymal scarring. Emphysema. Herniations of both posterior hemidiaphragms containing adipose tissue, similar to the prior chest CT. There is adjacent atelectasis along with some faint ground-glass opacity in the right lower lobe which could reflect early pneumonia. Trace right pleural effusion. Small amount of fluid posteriorly in the minor fissure. No worrisome lung mass. Upper abdomen: Unremarkable Musculoskeletal: Fatty atrophy of the much of the rotator cuff musculature bilaterally. Degenerative sternoclavicular arthropathy, right greater than left. Diffuse osseous metastatic disease in the thorax involving all bony structures. This represents a significant increase from 10/09/2014 and even appears increased compared to bone scan of 04/13/2015 IMPRESSION: 1. Progressive diffuse osseous metastatic disease, significantly worsening over the past 7 months. All bony structures in the thorax are affected. 2. No lung mass is identified. There herniations of both posterior hemidiaphragms containing adipose tissue. 3. In the right lower lobe adjacent to the herniation, there is some atelectasis and potentially some early pneumonia. 4. Emphysema. 5. Fatty atrophy of much of the rotator cuff musculature bilaterally. Electronically Signed   By: Van Clines M.D.   On: 11/12/2015 19:32        Scheduled Meds: . dolutegravir  50 mg Oral Daily  . emtricitabine-tenofovir AF  1 tablet Oral QHS  . enalapril  20 mg Oral Daily  . furosemide  20 mg Oral Daily  .  ipratropium-albuterol  3 mL Nebulization TID  . levofloxacin (LEVAQUIN) IV  750 mg Intravenous Q24H  . potassium chloride  10 mEq Oral Daily  . sodium chloride flush  3 mL Intravenous Q12H  . tamsulosin  0.4 mg Oral BID   Continuous Infusions: . dextrose 5 % and 0.45 % NaCl with KCl 20 mEq/L 125 mL/hr (11/13/15 0349)     LOS: 1 day    Time spent: 35 mins    Amy George Hugh [facility] Pager [phone]  If 7PM-7AM, please contact night-coverage [email or web address] Password [facility] 11/13/2015, 10:52 AM   Addendum  I personally evaluated Mr.Boyers on 11/05/2015 and agree with above findings. He is a 75 year old gentleman with a past medical history of HIV positive having last CD4 count of 130 on 06/14/2015, admitted overnight when he presented with complaints of cough, generalized weakness, malaise. Initial workup included a chest x-ray that revealed right lower lobe consolidation. He also had a CT scan of lungs with contrast that revealed possible  right lower lobe pneumonia. He was treated for community acquired pneumonia started on IV Levaquin 750 mg daily. He had been on prophylactic therapy with Bactrim having CD4 count less than 200. Imaging studies do not seem compatible with PCP. I think he likely has community-acquired pneumonia. On physical examination he had presence of a few bibasilar crackles, otherwise appeared nontoxic appearing, not require supplemental oxygen, no acute distress. He also reported bright red blood mixed with stool. He has a history of internal hemorrhoids having his last colonoscopy in 2014. Hemoglobin remains stable. I think nature of his GI bleed is likely hemorrhoidal. Plan to continue IV Levaquin, follow-up on blood cultures, repeat chest x-ray in a.m.

## 2015-11-13 NOTE — Progress Notes (Signed)
Pt requesting to have diet changed from ADA diet to regular or heart healthy.  States he is not a diabetic.  Is NOT on IV/PO steroids.  Blood glucose on ED lab chemistry was 115.

## 2015-11-13 NOTE — Progress Notes (Signed)
Pharmacy Antibiotic Note  Billy Lopez. is a 75 y.o. male admitted on 11/12/2015 with pneumonia.  Pharmacy has been consulted for Levofloxacin dosing.  Plan: Levofloxacin 750mg  iv q24hr Levofloxacin dosed based on patient weight and renal function     Height: 5\' 7"  (170.2 cm) Weight: 163 lb 9.3 oz (74.2 kg) IBW/kg (Calculated) : 66.1  Temp (24hrs), Avg:98.7 F (37.1 C), Min:98.2 F (36.8 C), Max:99.2 F (37.3 C)   Recent Labs Lab 11/12/15 1734  WBC 6.7  CREATININE 1.06    Estimated Creatinine Clearance: 58 mL/min (by C-G formula based on Cr of 1.06).    No Known Allergies  Antimicrobials this admission: Levofloxacin 6/11 >>  Dose adjustments this admission: -  Microbiology results: Pending  Thank you for allowing pharmacy to be a part of this patient's care.  Nani Skillern Crowford 11/13/2015 1:52 AM

## 2015-11-14 ENCOUNTER — Observation Stay (HOSPITAL_COMMUNITY): Payer: Commercial Managed Care - HMO

## 2015-11-14 DIAGNOSIS — J189 Pneumonia, unspecified organism: Secondary | ICD-10-CM | POA: Diagnosis not present

## 2015-11-14 DIAGNOSIS — Z515 Encounter for palliative care: Secondary | ICD-10-CM | POA: Insufficient documentation

## 2015-11-14 DIAGNOSIS — I10 Essential (primary) hypertension: Secondary | ICD-10-CM | POA: Diagnosis not present

## 2015-11-14 DIAGNOSIS — K625 Hemorrhage of anus and rectum: Secondary | ICD-10-CM | POA: Diagnosis not present

## 2015-11-14 DIAGNOSIS — R05 Cough: Secondary | ICD-10-CM | POA: Diagnosis not present

## 2015-11-14 LAB — COMPREHENSIVE METABOLIC PANEL
ALBUMIN: 3.5 g/dL (ref 3.5–5.0)
ALT: 16 U/L — AB (ref 17–63)
AST: 27 U/L (ref 15–41)
Alkaline Phosphatase: 918 U/L — ABNORMAL HIGH (ref 38–126)
Anion gap: 5 (ref 5–15)
BUN: 16 mg/dL (ref 6–20)
CHLORIDE: 111 mmol/L (ref 101–111)
CO2: 21 mmol/L — AB (ref 22–32)
CREATININE: 1.25 mg/dL — AB (ref 0.61–1.24)
Calcium: 8.8 mg/dL — ABNORMAL LOW (ref 8.9–10.3)
GFR calc Af Amer: >60 mL/min (ref >60)
GFR, EST NON AFRICAN AMERICAN: 55 mL/min — AB (ref >60)
GLUCOSE: 107 mg/dL — AB (ref 65–99)
POTASSIUM: 4.5 mmol/L (ref 3.5–5.1)
SODIUM: 137 mmol/L (ref 135–145)
Total Bilirubin: 0.2 mg/dL — ABNORMAL LOW (ref 0.3–1.2)
Total Protein: 6.8 g/dL (ref 6.5–8.1)

## 2015-11-14 LAB — BASIC METABOLIC PANEL
ANION GAP: 6 (ref 5–15)
BUN: 14 mg/dL (ref 6–20)
CALCIUM: 8.5 mg/dL — AB (ref 8.9–10.3)
CO2: 20 mmol/L — ABNORMAL LOW (ref 22–32)
Chloride: 111 mmol/L (ref 101–111)
Creatinine, Ser: 1.11 mg/dL (ref 0.61–1.24)
GLUCOSE: 120 mg/dL — AB (ref 65–99)
POTASSIUM: 3.9 mmol/L (ref 3.5–5.1)
SODIUM: 137 mmol/L (ref 135–145)

## 2015-11-14 LAB — CBC
HCT: 34 % — ABNORMAL LOW (ref 39.0–52.0)
Hemoglobin: 11.2 g/dL — ABNORMAL LOW (ref 13.0–17.0)
MCH: 26.3 pg (ref 26.0–34.0)
MCHC: 32.9 g/dL (ref 30.0–36.0)
MCV: 79.8 fL (ref 78.0–100.0)
PLATELETS: 192 10*3/uL (ref 150–400)
RBC: 4.26 MIL/uL (ref 4.22–5.81)
RDW: 15.5 % (ref 11.5–15.5)
WBC: 6.5 10*3/uL (ref 4.0–10.5)

## 2015-11-14 MED ORDER — SENNA 8.6 MG PO TABS
1.0000 | ORAL_TABLET | Freq: Every day | ORAL | Status: DC
  Administered 2015-11-14: 8.6 mg via ORAL
  Filled 2015-11-14: qty 1

## 2015-11-14 MED ORDER — OXYCODONE HCL 5 MG PO TABS
5.0000 mg | ORAL_TABLET | Freq: Four times a day (QID) | ORAL | Status: DC | PRN
  Administered 2015-11-14: 10 mg via ORAL
  Administered 2015-11-14 (×2): 5 mg via ORAL
  Administered 2015-11-15 (×2): 10 mg via ORAL
  Filled 2015-11-14 (×4): qty 2

## 2015-11-14 MED ORDER — SODIUM CHLORIDE 0.9 % IV SOLN: 90.0000 mg | Freq: Once | INTRAVENOUS | Status: DC

## 2015-11-14 MED ORDER — FENTANYL 25 MCG/HR TD PT72
25.0000 ug | MEDICATED_PATCH | TRANSDERMAL | Status: DC
  Administered 2015-11-14: 25 ug via TRANSDERMAL
  Filled 2015-11-14: qty 1

## 2015-11-14 MED FILL — TIVICAY 50 MG TABLET: 50 | 30 days supply | Qty: 30 | Fill #6

## 2015-11-14 MED FILL — DESCOVY 200-25 MG TABS: 200-25 | 30 days supply | Qty: 30 | Fill #6

## 2015-11-14 NOTE — Consult Note (Signed)
Consultation Note Date: 11/14/2015   Patient Name: Billy Lopez.  DOB: 1940/10/06  MRN: [id number]  Age / Sex: 75 y.o., male  PCP: Biagio Borg, MD Referring Physician: Kelvin Cellar, MD  Reason for Consultation: Pain control and Psychosocial/spiritual support  HPI/Patient Profile: 75 y.o. male  with past medical history of HIV, HTN, substance abuse, prostate cancer dx in 2013 with worsening disease in 2016 as evidenced by mets to the bone admitted on 11/12/2015 with cough, BRBPR, " just not feeling right". He was found to have PNA. BRBPR felt to be r/t hemorrhoids and none seen since admission. Additionally CT scan performed as showed significant worsening of osseous mets over past 7 months effecting his entire thorax. .   Clinical Assessment and Goals of Care: Asked to see pt for pain mgt. States his pain now is " from my feet on up". He associated worsening pain with PNA which he states he has had in the past. At baseline his pain is to back, shoulders and legs. He reports his pain has been so severe that he has been unable to walk distances and is using his WC more. Pt shares that he used to use heroin and quit in the 1965. He has not taken other prescribed pain medications other than hydrocodone. His current prescription is filled through his primary care doctor Dr. Jenny Reichmann. He states his PCP has told him he needs to have his pain meds filled through a pain clinic but has not established. Pt's cancer tx was with Dr. Alen Blew  Pt at this point can speak for himself    SUMMARY OF RECOMMENDATIONS   DC hydrocodone 10 mg/APAP. Pt has been on a short acting agent for months with an average daily usage of 3 or more doses.  DC IV MS04 Draw CMP: for albumin to calculate whether pt is a candidate for pamidronate Will hold off NSAID because of h/o gastritis on naproxen and possible SE with acid suppression drugs with  anti-viral meds Low dose steroids could be an option but not sure where he is in his disease trajectory r/t prostate cancer with worsening bone mets.  Pt will need FU appt with Dr. Alen Blew for on-going pain mgt r/t to his worsening cancer dx. Recommend CM schedule an appt with Wadie Lessen, ANP-ACHPN upon discharge for FU pain mgt. Order placed to CM Code Status/Advance Care Planning:  Full code    Symptom Management:   Pain: Start Fentanyl TD 25 mcg Q 72 hours/ Pt has a TDD of MS04 60 mg. Will DC IV MS04 and substitute oxycodone 5-10 mg q6 prn. Will draw CMP , which will include albumin, to see if pt is safe for pamidronate infusion.  Constipation: Start senna 1 tab QHS. Monitor and titrate for effect  Palliative Prophylaxis:   Bowel Regimen  Additional Recommendations (Limitations, Scope, Preferences):  Full Scope Treatment  Psycho-social/Spiritual:   Desire for further Chaplaincy support:no  Additional Recommendations: Referral to Community Resources   Prognosis:   Unable to determine  Discharge Planning: Home     Primary Diagnoses: Present on Admission:  . CAP (community acquired pneumonia) . Essential hypertension . COPD (chronic obstructive pulmonary disease) ([place]) . Constipation . BRBPR (bright red blood per rectum)  I have reviewed the medical record, interviewed the patient and family, and examined the patient. The following aspects are pertinent.  Past Medical History  Diagnosis Date  . Hypertension   . HIV infection ([place])   . Substance abuse   . Heroin withdrawal ([place]) 07/21/2011  . Allergic rhinitis, cause unspecified 07/21/2011  . Cervical spondylosis 07/21/2011  . Lumbar spondylosis 07/21/2011  . Osteoarthritis 07/21/2011  . COPD (chronic obstructive pulmonary disease) ([place]) 07/21/2011  . 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 Hx of radiation therapy 12/17/11 - 02/13/12    prostate  . Prostate cancer ([place]) 09/19/11    gleason 7  . H/O blood clots   .  Pneumonia   . GERD (gastroesophageal reflux disease)   . Hepatitis     Hep C  . Rectal bleeding   . Wears dentures     top  . Wears glasses   . Septic joint of left shoulder region ([place]) 06/15/2015  . CKD (chronic kidney disease) stage 3, GFR 30-59 ml/min 06/28/2015  . S/P radiation therapy 12/17/2011 through 02/13/2012    78 gray was delivered to the prostate in 40 fractions of 1.95 gray  . Prostate cancer metastatic to bone [facility]) 08/30/2015   Social History   Social History  . Marital Status: Divorced    Spouse Name: N/A  . Number of Children: 3  . Years of Education: 16   Occupational History  . UNEMPLOYED   . Retired     Social worker   Social History Main Topics  . Smoking status: Former Smoker -- 1.00 packs/day for 10 years    Types: Cigarettes    Quit date: 10/13/1960  . Smokeless tobacco: Never Used  . Alcohol Use: No  . Drug Use: No     Comment: pt. states stopped using drugs 10/12  . Sexual Activity: Not Currently     Comment: pt. given condoms   Other Topics Concern  . None   Social History Narrative   Separated x 7 years   3 daughters    Culture:mother is [place] and father hispanic   Speaks [place], [place], [place] and [place].         Family History  Problem Relation Age of Onset  . Alcohol abuse Other   . Arthritis Other   . Hypertension Other   . Colon cancer Neg Hx   . Diabetes Neg Hx   . Cancer Mother     unsure of type   Scheduled Meds: . dolutegravir  50 mg Oral Daily  . emtricitabine-tenofovir AF  1 tablet Oral QHS  . enalapril  20 mg Oral Daily  . fentaNYL  25 mcg Transdermal Q72H  . ipratropium-albuterol  3 mL Nebulization BID  . levofloxacin (LEVAQUIN) IV  750 mg Intravenous Q24H  . pamidronate  90 mg Intravenous Once  . potassium chloride  10 mEq Oral Daily  . sodium chloride flush  3 mL Intravenous Q12H  . tamsulosin  0.4 mg Oral BID   Continuous Infusions:  PRN Meds:.acetaminophen **OR** acetaminophen, albuterol,  bisacodyl, oxyCODONE Medications Prior to Admission:  Prior to Admission medications   Medication Sig Start Date End Date Taking? Authorizing Provider  acetaminophen (TYLENOL) 325 MG tablet Take 2 tablets (650 mg total) by mouth every 6 (six)  hours as needed for mild pain (or Fever >/= 101). 10/13/14  Yes Robbie Lis, MD  aspirin EC 325 MG EC tablet Take 1 tablet (325 mg total) by mouth 2 (two) times daily. 10/13/14  Yes Robbie Lis, MD  bisacodyl (DULCOLAX) 5 MG EC tablet Take 1 tablet (5 mg total) by mouth daily as needed for moderate constipation. 10/13/14  Yes Robbie Lis, MD  cholecalciferol (VITAMIN D) 1000 UNITS tablet Take 1,000 Units by mouth every evening.   Yes Historical Provider, MD  dolutegravir (TIVICAY) 50 MG tablet Take 1 tablet (50 mg total) by mouth daily. 05/22/15  Yes Thayer Headings, MD  emtricitabine-tenofovir AF (DESCOVY) 200-25 MG tablet Take 1 tablet by mouth at bedtime. 05/22/15  Yes Thayer Headings, MD  enalapril (VASOTEC) 20 MG tablet Take 1 tablet (20 mg total) by mouth daily. 11/06/15  Yes Biagio Borg, MD  feeding supplement, ENSURE COMPLETE, (ENSURE COMPLETE) LIQD Take 237 mLs by mouth 2 (two) times daily between meals. 04/12/15  Yes Biagio Borg, MD  furosemide (LASIX) 20 MG tablet Take 1 tablet (20 mg total) by mouth daily. 11/08/15  Yes Biagio Borg, MD  HYDROcodone-acetaminophen [facility]) 10-325 MG tablet take 1/2 to 1 tablet by mouth every 8 hours if needed Patient taking differently: Take 0.5-1 tablets by mouth every 8 (eight) hours as needed for moderate pain or severe pain.  11/01/15  Yes Biagio Borg, MD  potassium chloride (K-DUR) 10 MEQ tablet Take 1 tablet (10 mEq total) by mouth daily. 11/23/14  Yes Biagio Borg, MD  tamsulosin (FLOMAX) 0.4 MG CAPS capsule Take 1 capsule (0.4 mg total) by mouth 2 (two) times daily. 08/22/15  Yes Biagio Borg, MD  traMADol (ULTRAM) 50 MG tablet Take 1 tablet (50 mg total) by mouth every 6 (six) hours as needed. Patient taking  differently: Take 50 mg by mouth every 6 (six) hours as needed for moderate pain or severe pain.  09/05/15  Yes Biagio Borg, MD  vitamin A 10000 UNIT capsule Take 10,000 Units by mouth every evening.   Yes Historical Provider, MD  vitamin C (ASCORBIC ACID) 500 MG tablet Take 500 mg by mouth every evening.   Yes Historical Provider, MD  predniSONE (DELTASONE) 20 MG tablet Take 1 tablet (20 mg total) by mouth daily with breakfast. Patient not taking: Reported on 11/12/2015 06/28/15   Truman Hayward, MD   No Known Allergies Review of Systems  Constitutional: Positive for activity change and fatigue.  HENT: Negative.   Eyes: Negative.   Respiratory: Positive for cough and chest tightness.   Endocrine: Positive for cold intolerance.  Genitourinary: Negative.   Musculoskeletal: Positive for back pain.  Allergic/Immunologic: Negative.   Neurological: Positive for weakness.  Psychiatric/Behavioral: Negative.     Physical Exam  Constitutional: He is oriented to person, place, and time. He appears well-nourished.  HENT:  Head: Normocephalic and atraumatic.  Neck: Normal range of motion.  Pulmonary/Chest: Effort normal. No respiratory distress. He has no wheezes.  Musculoskeletal: Normal range of motion.  Neurological: He is alert and oriented to person, place, and time.  Skin: Skin is warm and dry.  Psychiatric: He has a normal mood and affect.  Nursing note and vitals reviewed.   Vital Signs: BP 137/90 mmHg  Pulse 71  Temp(Src) 99.6 F (37.6 C) (Oral)  Resp 20  Ht 5\' 7"  (1.702 m)  Wt 74.2 kg (163 lb 9.3 oz)  BMI 25.61  kg/m2  SpO2 100% Pain Assessment: 0-10   Pain Score: Asleep   SpO2: SpO2: 100 % O2 Device:SpO2: 100 % O2 Flow Rate: .   IO: Intake/output summary:  Intake/Output Summary (Last 24 hours) at 11/14/15 1546 Last data filed at 11/14/15 1300  Gross per 24 hour  Intake    630 ml  Output   2550 ml  Net  -1920 ml    LBM: Last BM Date: 11/13/15 Baseline  Weight: Weight: 74.2 kg (163 lb 9.3 oz) Most recent weight: Weight: 74.2 kg (163 lb 9.3 oz)     Palliative Assessment/Data:   Flowsheet Rows        Most Recent Value   Intake Tab    Referral Department  Hospitalist   Unit at Time of Referral  Med/Surg Unit   Palliative Care Primary Diagnosis  Pain   Date Notified  11/14/15   Palliative Care Type  New Palliative care   Reason for referral  Pain   Date of Admission  11/12/15   Date first seen by Palliative Care  11/14/15   # of days Palliative referral response time  0 Day(s)   # of days IP prior to Palliative referral  2   Clinical Assessment    Palliative Performance Scale Score  60%   Pain Max last 24 hours  8   Pain Min Last 24 hours  6   Dyspnea Max Last 24 Hours  0   Dyspnea Min Last 24 hours  0   Nausea Max Last 24 Hours  0   Nausea Min Last 24 Hours  0   Anxiety Max Last 24 Hours  0   Anxiety Min Last 24 Hours  0   Other Max Last 24 Hours  0   Psychosocial & Spiritual Assessment    Palliative Care Outcomes    Patient/Family meeting held?  Yes   Who was at the meeting?  pt   Palliative Care Outcomes  Improved pain interventions   Other Treatment Preference Instructions  needs FU with oncologist, Dr. Alen Blew, and Wadie Lessen NP for on-going pain mgt      Time In: 1430 Time Out: 1545 Time Total: 75 min Greater than 50%  of this time was spent counseling and coordinating care related to the above assessment and plan. Staffed with Dr. Rowe Pavy and Dr. Coralyn Pear  Signed by: Dory Horn, NP   Please contact Palliative Medicine Team phone at [phone] for questions and concerns.  For individual provider: See Shea Evans

## 2015-11-14 NOTE — Progress Notes (Signed)
PROGRESS NOTE    Billy Lopez.  [id number] DOB: 11-28-1940 DOA: 11/12/2015 PCP: [name], MD      Brief Narrative:  Billy Lopez is a 75 year old African American male who presented to the ED with a week history of productive cough and a few day history of bright red blood mixed with stool. 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 Patient stated he has been coughing and slightly nauseas and "just isn't feeling right" for the past week. He denied hemoptysis, fevers and chills. He also reports a few day history of bright red blood in stool which started after experiencing constipation and noted increased straining during BM. His last colonoscopy was in 2014 with internal hemorrhoids and radiation proctitis. He was started on Levaquin for CAP in the ED. Hgb of 11.6 at admission, close to baseline. He had 1 dose of Bactrim as prophylaxis for a CD4 count of 130.    Assessment & Plan:   Principal Problem:   CAP (community acquired pneumonia) Active Problems:   Essential hypertension   COPD (chronic obstructive pulmonary disease) (HCC)   Constipation   BRBPR (bright red blood per rectum)   1. CAP - Coughing significantly improved with no sputum production  - To obtain sputum culture if available - Continue Levaquin 750mg  IV Qdaily - Continue albuterol Q4H PRN for SOB and Duoneb TID - Currently no clinical suspicion of PCP pneumonia with CXR and CT. Received 1 dose of Bactrim prophylaxis  - Repeat CXR shows mild patchy RLL opacity, atelectasis vs pneumonia. I think he can likely be transitioned to oral antimicrobial therapy in am if he remains afebrile and clinically stable.  2. BRBPR - Association with constipation, continue Dulcolax for constipation - Last colonoscopy 11/17/2012 with internal hemorrhoids and radiation proctitis, colon cancer screen negative - Hgb on admission 11.6, repeat Hgb at 11.1. Hgb  today stable. Continue to monitor for now - Continue to hold aspirin for now  3. Pain management - Patient complaining of worsening pain on admission, possibly due to metastatic bone cancer. Patient is vague on description of pain, but feels that his home dose of Hydrocodone-acetaminophen 10-325mg  is not controlling pain well.  - Patient still does not feel that pain is well controlled on new regimen of Hydrocodone-acetaminophen 10-325mg  0.5-1 tab Q8H PRN and Morphine 2mg  IV Q4H PRN.  - Discussed with patient of need for PO management alone for chronic pain, discussed possibility of palliative care consult for better control of pain. Patient is opened to palliative care consult today.   4. 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 Goal of therapy is palliative with hormone therapy alone. Did not recommend chemotherapy  5. HIV - Last CD4 count at 130 on 06/14/2015 with HIV viral < 20 copies/mL - Patient sees Dr Scharlene Gloss for HIV follow up - Continue Descovy and Tivicay  6. Hypertension - Continue Enalapril 20mg  PO Qdaily    DVT prophylaxis: SCD's Code Status: Full Family Communication: None at bedside Disposition Plan: Anticipate discharge in the next 24-48 hours to home   Consultants:   None  Procedures:   None  Antimicrobials:   Levaquin  Bactrim   Subjective: Patient expresses overall improvement. Less cough. Still states pain uncontrolled. Has been ambulatory.   Objective: Filed Vitals:   11/13/15 1405 11/13/15 2008 11/13/15 2126 11/14/15 0416  BP:   145/84 136/82  Pulse:   83 67  Temp:   99.7 F (37.6 C) 99.1 F  (37.3 C)  TempSrc:   Oral Oral  Resp:   18 18  Height:      Weight:      SpO2: 96% 99% 100% 100%    Intake/Output Summary (Last 24 hours) at 11/14/15 0844 Last data filed at 11/14/15 0700  Gross per 24 hour  Intake 1882.5 ml  Output   2875 ml  Net -992.5 ml   Filed Weights   11/12/15 2152  Weight: 74.2 kg (163 lb 9.3 oz)    Examination:  General exam: Appears calm and comfortable, in no acute distress Respiratory system: Minimal crackles. Respiratory effort normal. Cardiovascular system: S1 & S2 heard, RRR. No JVD, murmurs, rubs, gallops or clicks. No pedal edema. Gastrointestinal system: Abdomen is nondistended, soft and nontender. No organomegaly or masses felt. Normal bowel sounds heard. Central nervous system: Alert and oriented. No focal neurological deficits. Extremities: Symmetric 5 x 5 power. Skin: No rashes, lesions or ulcers Psychiatry: Judgement and insight appear normal. Mood & affect appropriate.     Data Reviewed: I have personally reviewed following labs and imaging studies  CBC:  Recent Labs Lab 11/12/15 1734 11/13/15 0220 11/14/15 0827  WBC 6.7 6.1 6.5  HGB 11.6* 11.1* 11.2*  HCT 35.4* 34.0* 34.0*  MCV 82.5 82.5 79.8  PLT 224 216 [id number]   Basic Metabolic Panel:  Recent Labs Lab 11/12/15 1734 11/13/15 0220  NA 135 135  K 4.0 4.1  CL 110 111  CO2 20* 19*  GLUCOSE 115* 104*  BUN 18 19  CREATININE 1.06 1.11  CALCIUM 8.7* 8.6*   GFR: Estimated Creatinine Clearance: 55.4 mL/min (by C-G formula based on Cr of 1.11). Liver Function Tests: No results for input(s): AST, ALT, ALKPHOS, BILITOT, PROT, ALBUMIN in the last 168 hours. No results for input(s): LIPASE, AMYLASE in the last 168 hours. No results for input(s): AMMONIA in the last 168 hours. Coagulation Profile: No results for input(s): INR, PROTIME in the last 168 hours. Cardiac Enzymes: No results for input(s): CKTOTAL, CKMB, CKMBINDEX, TROPONINI in the last 168 hours. BNP (last 3  results) No results for input(s): PROBNP in the last 8760 hours. HbA1C: No results for input(s): HGBA1C in the last 72 hours. CBG: No results for input(s): GLUCAP in the last 168 hours. Lipid Profile: No results for input(s): CHOL, HDL, LDLCALC, TRIG, CHOLHDL, LDLDIRECT in the last 72 hours. Thyroid Function Tests: No results for input(s): TSH, T4TOTAL, FREET4, T3FREE, THYROIDAB in the last 72 hours. Anemia Panel: No results for input(s): VITAMINB12, FOLATE, FERRITIN, TIBC, IRON, RETICCTPCT in the last 72 hours. Urine analysis:    Component Value Date/Time   COLORURINE YELLOW 06/14/2015 1113   APPEARANCEUR CLEAR 06/14/2015 1113   LABSPEC 1.007 06/14/2015 1113   PHURINE 5.5 06/14/2015 1113   GLUCOSEU NEGATIVE 06/14/2015 1113   GLUCOSEU NEGATIVE 02/22/2015 1118   HGBUR NEGATIVE 06/14/2015 1113   BILIRUBINUR NEGATIVE 06/14/2015 1113   KETONESUR NEGATIVE 06/14/2015 1113   PROTEINUR NEGATIVE 06/14/2015 1113   UROBILINOGEN 0.2 02/22/2015 1118   NITRITE NEGATIVE 06/14/2015 1113   LEUKOCYTESUR NEGATIVE 06/14/2015 1113   Sepsis Labs: @LABRCNTIP (procalcitonin:4,lacticidven:4)  )No results found  for this or any previous visit (from the past 240 hour(s)).       Radiology Studies: Dg Chest 2 View  11/14/2015  CLINICAL DATA:  Cough, HIV, metastatic prostate cancer, pneumonia EXAM: CHEST  2 VIEW COMPARISON:  CT chest dated 11/12/2015 FINDINGS: Mild patchy right lower lobe opacity, atelectasis versus pneumonia. No pleural effusion or pneumothorax. Heart is normal in size. Degenerative changes of the visualized thoracolumbar spine. IMPRESSION: Mild patchy right lower lobe opacity, atelectasis versus pneumonia. Electronically Signed   By: Julian Hy M.D.   On: 11/14/2015 08:37   Dg Chest 2 View  11/12/2015  CLINICAL DATA:  Pt c/o chest pain, cough, and dark red GI bleeding. Pain worse with cough, palpation, inspiration, but also present when patient sits still without cough. EXAM:  CHEST  2 VIEW COMPARISON:  06/14/2015 radiograph, 516 CT thorax FINDINGS: New consolidation and costophrenic angle blunting right lung base. Heart size and vascular pattern normal. Left lung clear. Chronic rotator cuff tears. IMPRESSION: Right lower lobe consolidation and small effusion. Differential includes pneumonia and mass. Eventration of the diaphragm also possible. Consider CT thorax with contrast. Electronically Signed   By: Skipper Cliche M.D.   On: 11/12/2015 16:58   Ct Chest W Contrast  11/12/2015  CLINICAL DATA:  Chest pain, cough. GI bleed. Prostate cancer with osseous metastatic disease. EXAM: CT CHEST WITH CONTRAST TECHNIQUE: Multidetector CT imaging of the chest was performed during intravenous contrast administration. CONTRAST:  27mL ISOVUE-300 IOPAMIDOL (ISOVUE-300) INJECTION 61% COMPARISON:  Multiple exams, including 11/12/2015 chest radiograph and prior chest CT from 10/09/2014 FINDINGS: Mediastinum/Nodes: Prevascular lymph node 0.7 cm on image 25/2, formerly the same. No pathologic thoracic adenopathy identified. No specific esophageal abnormality observed. Lungs/Pleura: Mild biapical pleural parenchymal scarring. Emphysema. Herniations of both posterior hemidiaphragms containing adipose tissue, similar to the prior chest CT. There is adjacent atelectasis along with some faint ground-glass opacity in the right lower lobe which could reflect early pneumonia. Trace right pleural effusion. Small amount of fluid posteriorly in the minor fissure. No worrisome lung mass. Upper abdomen: Unremarkable Musculoskeletal: Fatty atrophy of the much of the rotator cuff musculature bilaterally. Degenerative sternoclavicular arthropathy, right greater than left. Diffuse osseous metastatic disease in the thorax involving all bony structures. This represents a significant increase from 10/09/2014 and even appears increased compared to bone scan of 04/13/2015 IMPRESSION: 1. Progressive diffuse osseous  metastatic disease, significantly worsening over the past 7 months. All bony structures in the thorax are affected. 2. No lung mass is identified. There herniations of both posterior hemidiaphragms containing adipose tissue. 3. In the right lower lobe adjacent to the herniation, there is some atelectasis and potentially some early pneumonia. 4. Emphysema. 5. Fatty atrophy of much of the rotator cuff musculature bilaterally. Electronically Signed   By: Van Clines M.D.   On: 11/12/2015 19:32        Scheduled Meds: . dolutegravir  50 mg Oral Daily  . emtricitabine-tenofovir AF  1 tablet Oral QHS  . enalapril  20 mg Oral Daily  . ipratropium-albuterol  3 mL Nebulization BID  . levofloxacin (LEVAQUIN) IV  750 mg Intravenous Q24H  . potassium chloride  10 mEq Oral Daily  . sodium chloride flush  3 mL Intravenous Q12H  . tamsulosin  0.4 mg Oral BID   Continuous Infusions:     LOS: 2 days    Time spent: 35 mins    Alfonso Patten [facility] Pager [phone]  If 7PM-7AM, please contact night-coverage [email or web address] Password  [email or web address] 11/14/2015, 8:44 AM   Addendum  I personally evaluated Billy Lopez on 11/14/2015 and agree with the above findings. He was admitted to the medicine service on 11/12/2015 when he presented with clinical signs and symptoms suggestive of pneumonia. Chest x-ray revealed right lower lobe infiltrate as he was started on IV Levaquin. He seems to be showing clinical improvement. I repeated a chest x-ray today which showed stable right lower lobe infiltrate. I think he has he continues to do well and remains afebrile we can transition to oral antimicrobial therapy tomorrow.  With regards to his complaints of bright red blood per rectum looks like he has a history of internal hemorrhoids seen on a colonoscopy in 2014. He described having bright red blood mixed with stool which limits in height consistent with hemorrhoidal bleed. His hemoglobin has remained  stable 11.2 and he has not had further episodes of bright red blood per rectum. He has a history of metastatic prostate cancer with a CT scan of chest with IV contrast showing progressive diffuse osseous metastatic disease. I think he would benefit from a palliative care consult. He reports having thoracic wall pain and during this hospitalization reporting symptoms not well controlled at times. Consulted palliative care for assistance with pain management in context of metastatic prostate cancer. Otherwise I think he'll likely be ready for discharge the next 24-48 hours.

## 2015-11-15 ENCOUNTER — Telehealth: Payer: Self-pay | Admitting: Oncology

## 2015-11-15 DIAGNOSIS — R52 Pain, unspecified: Secondary | ICD-10-CM | POA: Diagnosis not present

## 2015-11-15 DIAGNOSIS — K625 Hemorrhage of anus and rectum: Secondary | ICD-10-CM | POA: Diagnosis not present

## 2015-11-15 DIAGNOSIS — I10 Essential (primary) hypertension: Secondary | ICD-10-CM | POA: Diagnosis not present

## 2015-11-15 DIAGNOSIS — K5900 Constipation, unspecified: Secondary | ICD-10-CM

## 2015-11-15 DIAGNOSIS — Z515 Encounter for palliative care: Secondary | ICD-10-CM | POA: Diagnosis not present

## 2015-11-15 DIAGNOSIS — J189 Pneumonia, unspecified organism: Secondary | ICD-10-CM | POA: Diagnosis not present

## 2015-11-15 LAB — CBC
HEMATOCRIT: 33.6 % — AB (ref 39.0–52.0)
Hemoglobin: 11.1 g/dL — ABNORMAL LOW (ref 13.0–17.0)
MCH: 26.5 pg (ref 26.0–34.0)
MCHC: 33 g/dL (ref 30.0–36.0)
MCV: 80.2 fL (ref 78.0–100.0)
PLATELETS: 188 10*3/uL (ref 150–400)
RBC: 4.19 MIL/uL — ABNORMAL LOW (ref 4.22–5.81)
RDW: 15.8 % — AB (ref 11.5–15.5)
WBC: 5.7 10*3/uL (ref 4.0–10.5)

## 2015-11-15 MED ORDER — LEVOFLOXACIN 750 MG PO TABS: 750.0000 mg | ORAL_TABLET | Freq: Every day | ORAL | Status: DC

## 2015-11-15 MED ORDER — FENTANYL 25 MCG/HR TD PT72: 25.0000 ug | MEDICATED_PATCH | TRANSDERMAL | Status: DC

## 2015-11-15 MED ORDER — OXYCODONE HCL 5 MG PO TABS: 5.0000 mg | ORAL_TABLET | Freq: Four times a day (QID) | ORAL | Status: DC | PRN

## 2015-11-15 NOTE — Telephone Encounter (Signed)
s.w. pt and advised on new appt.....pt ok and aware °

## 2015-11-15 NOTE — Discharge Summary (Signed)
Physician Discharge Summary  Billy Lopez. [id number] DOB: 1940/07/31 DOA: 11/12/2015  PCP: [name], MD  Admit date: 11/12/2015 Discharge date: 11/15/2015  Time spent: 35 minutes  Recommendations for Outpatient Follow-up:  1. Patient was admitted for commute acquire pneumonia discharged on Levaquin 2. He reported bright red blood mixed with stools, suspect related to hemorrhoidal bleed. His hemoglobin remained stable, on discharge had hemoglobin of 11.1 with hematocrit of 33.6. 3. During this hospitalization he was evaluated by palliative care, started on a fentanyl patch. Please follow-up on pain management.   Discharge Diagnoses:  Principal Problem:   CAP (community acquired pneumonia) Active Problems:   Essential hypertension   COPD (chronic obstructive pulmonary disease) (HCC)   Constipation   BRBPR (bright red blood per rectum)   Palliative care encounter   Generalized pain   Discharge Condition: Stable  Diet recommendation: Regular diet  Filed Weights   11/12/15 2152  Weight: 74.2 kg (163 lb 9.3 oz)    History of present illness:  Billy Lopez. is a 75 y.o. male with medical history significant HIV, hypertension, substance abuse who presented to the emergency room with chief complaint of feeling poorly. Patient states for the last week he's been coughing. Cough has been productive of yellow-green sputum. Patient denies any hemoptysis. Patient states this is made him feel worn down. Denies fevers and chills. Positive for some nausea. Patient states the most concerning thing is that he had a bloody bowel movement today. He states the bowel movement was stool mixed with bright red blood. Patient feels that it appeared to be stirred in. Patient denies any belly pain, lightheadedness, palpitations, dizziness, presyncope and syncope.  Hospital Course:  Billy Lopez is a 75 year old African American male who presented to the ED with a week history of productive cough  and a few day history of bright red blood mixed with stool. 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 Patient stated he has been coughing and slightly nauseas and "just isn't feeling right" for the past week. He denied hemoptysis, fevers and chills. He also reports a few day history of bright red blood in stool which started after experiencing constipation and noted increased straining during BM. His last colonoscopy was in 2014 with internal hemorrhoids and radiation proctitis. He was started on Levaquin for CAP in the ED. Hgb of 11.6 at admission, close to baseline. He had been on Bactrim as prophylaxis for a CD4 count of 130.   1. CAP - Coughing significantly improved with no sputum production  - To obtain sputum culture if available - Continue Levaquin 750mg  IV Qdaily - Continue albuterol Q4H PRN for SOB and Duoneb TID - Currently no clinical suspicion of PCP pneumonia with CXR and CT.  - Repeat CXR shows mild patchy RLL opacity, atelectasis vs pneumonia. -By 11/15/2015 showing significant clinical improvement, he was discharged on Levaquin 750 mg by mouth  2. BRBPR - Association with constipation, continue Dulcolax for constipation - Last colonoscopy 11/17/2012 with internal hemorrhoids and radiation proctitis, colon cancer screen negative - Hgb on admission 11.6, repeat Hgb at 11.1. Hgb  - Continue to hold aspirin for now  3. Pain management - Patient complaining of worsening pain on admission, possibly due to metastatic bone cancer. Patient is vague on description of pain, but feels that his home dose of Hydrocodone-acetaminophen 10-325mg  is not controlling pain well.  - Patient still does not feel  that pain is well controlled on new regimen of Hydrocodone-acetaminophen 10-325mg  0.5-1 tab Q8H PRN and Morphine 2mg  IV Q4H PRN.  - Discussed with patient of need for PO management alone for chronic pain, discussed  possibility of palliative care consult for better control of pain.  -Palliative care recommending starting fentanyl patch at 25 g daily. On 11/15/2015 patient reporting improvement to his pain symptoms. -He would benefit from palliative care follow-up at the cancer center.  4. 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 Goal of therapy is palliative with hormone therapy alone.  -Patient will be given follow-up appointment with Dr. Alen Blew at the cancer center  5. HIV - Last CD4 count at 130 on 06/14/2015 with HIV viral < 20 copies/mL - Patient sees Dr Scharlene Gloss for HIV follow up - Continue Descovy and Tivicay  6. Hypertension - Continue Enalapril 20mg  PO Qdaily    Consultations:  Palliative Care  Discharge Exam: Filed Vitals:   11/14/15 2114 11/15/15 0509  BP: 145/73 131/80  Pulse: 63 64  Temp: 99.3 F (37.4 C) 98.6 F (37 C)  Resp: 18 18   General exam: Appears calm and comfortable, in no acute distress Respiratory system: Minimal crackles. Respiratory effort normal. Cardiovascular system: S1 & S2 heard, RRR. No JVD, murmurs, rubs, gallops or clicks. No pedal edema. Gastrointestinal system: Abdomen is nondistended, soft and nontender. No organomegaly or masses felt. Normal bowel sounds heard. Central nervous system: Alert and oriented. No focal neurological deficits. Extremities: Symmetric 5 x 5 power. Skin: No rashes, lesions or ulcers Psychiatry: Judgement and insight appear normal. Mood & affect appropriate.    Discharge Instructions   Discharge Instructions    Call MD for:  difficulty breathing, headache or visual  disturbances    Complete by:  As directed      Call MD for:  extreme fatigue    Complete by:  As directed      Call MD for:  hives    Complete by:  As directed      Call MD for:  persistant dizziness or light-headedness    Complete by:  As directed      Call MD for:  persistant nausea and vomiting    Complete by:  As directed      Call MD for:  redness, tenderness, or signs of infection (pain, swelling, redness, odor or green/yellow discharge around incision site)    Complete by:  As directed      Call MD for:  severe uncontrolled pain    Complete by:  As directed      Call MD for:  temperature >100.4    Complete by:  As directed      Call MD for:    Complete by:  As directed      Diet - low sodium heart healthy    Complete by:  As directed      Increase activity slowly    Complete by:  As directed           Current Discharge Medication List    START taking these medications   Details  fentaNYL (DURAGESIC - DOSED MCG/HR) 25 MCG/HR patch Place 1  patch (25 mcg total) onto the skin every 3 (three) days. Qty: 10 patch, Refills: 0    levofloxacin (LEVAQUIN) 750 MG tablet Take 1 tablet (750 mg total) by mouth daily. Qty: 3 tablet, Refills: 0    oxyCODONE (OXY IR/ROXICODONE) 5 MG immediate release tablet Take 1 tablet (5 mg total) by mouth every 6 (six) hours as needed for moderate pain. Qty: 15 tablet, Refills: 0      CONTINUE these medications which have NOT CHANGED   Details  acetaminophen (TYLENOL) 325 MG tablet Take 2 tablets (650 mg total) by mouth every 6 (six) hours as needed for mild pain (or Fever >/= 101). Qty: 30 tablet, Refills: 0    bisacodyl (DULCOLAX) 5 MG EC tablet Take 1 tablet (5 mg total) by mouth daily as needed for moderate constipation. Qty: 30 tablet, Refills: 0    cholecalciferol (VITAMIN D) 1000 UNITS tablet Take 1,000 Units by mouth every evening.    dolutegravir (TIVICAY) 50 MG tablet Take 1 tablet (50 mg total) by mouth daily. Qty: 30 tablet,  Refills: 6    emtricitabine-tenofovir AF (DESCOVY) 200-25 MG tablet Take 1 tablet by mouth at bedtime. Qty: 30 tablet, Refills: 6    enalapril (VASOTEC) 20 MG tablet Take 1 tablet (20 mg total) by mouth daily. Qty: 30 tablet, Refills: 0   Associated Diagnoses: Malignant neoplasm of prostate ([place])    feeding supplement, ENSURE COMPLETE, (ENSURE COMPLETE) LIQD Take 237 mLs by mouth 2 (two) times daily between meals. Qty: 474 mL, Refills: 11    potassium chloride (K-DUR) 10 MEQ tablet Take 1 tablet (10 mEq total) by mouth daily. Qty: 90 tablet, Refills: 3    tamsulosin (FLOMAX) 0.4 MG CAPS capsule Take 1 capsule (0.4 mg total) by mouth 2 (two) times daily. Qty: 60 capsule, Refills: 2    vitamin A 10000 UNIT capsule Take 10,000 Units by mouth every evening.    vitamin C (ASCORBIC ACID) 500 MG tablet Take 500 mg by mouth every evening.      STOP taking these medications     aspirin EC 325 MG EC tablet      furosemide (LASIX) 20 MG tablet      HYDROcodone-acetaminophen (NORCO) 10-325 MG tablet      traMADol (ULTRAM) 50 MG tablet      predniSONE (DELTASONE) 20 MG tablet        No Known Allergies Follow-up Information    Follow up with [name], MD In 2 weeks.   Specialties:  Internal Medicine, Radiology   Contact information:   [address] [place] [address] [phone]       Follow up with [facility], MD In 1 week.   Specialty:  Oncology   Contact information:   [address] [place] [address] [phone]        The results of significant diagnostics from this hospitalization (including imaging, microbiology, ancillary and laboratory) are listed below for reference.    Significant Diagnostic Studies: Dg Chest 2 View  11/14/2015  CLINICAL DATA:  Cough, HIV, metastatic prostate cancer, pneumonia EXAM: CHEST  2 VIEW COMPARISON:  CT chest dated 11/12/2015 FINDINGS: Mild patchy right lower lobe opacity, atelectasis versus pneumonia. No pleural  effusion or pneumothorax. Heart is normal in size. Degenerative changes of the visualized thoracolumbar spine. IMPRESSION: Mild patchy right lower lobe opacity, atelectasis versus pneumonia. Electronically Signed   By: Julian Hy M.D.   On: 11/14/2015 08:37   Dg Chest 2 View  11/12/2015  CLINICAL DATA:  Pt c/o chest pain, cough, and dark red GI bleeding. Pain worse with cough, palpation, inspiration, but also present when patient sits still without cough. EXAM: CHEST  2 VIEW COMPARISON:  06/14/2015 radiograph, 516 CT thorax FINDINGS: New consolidation and costophrenic angle blunting right lung base. Heart size and vascular pattern normal. Left lung clear. Chronic rotator cuff tears. IMPRESSION: Right lower lobe consolidation and small effusion. Differential includes pneumonia and mass. Eventration of the diaphragm also possible. Consider CT thorax with contrast. Electronically Signed   By: Skipper Cliche M.D.   On: 11/12/2015 16:58   Ct Chest W Contrast  11/12/2015  CLINICAL DATA:  Chest pain, cough. GI bleed. Prostate cancer with osseous metastatic disease. EXAM: CT CHEST WITH CONTRAST TECHNIQUE: Multidetector CT imaging of the chest was performed during intravenous contrast administration. CONTRAST:  48mL ISOVUE-300 IOPAMIDOL (ISOVUE-300) INJECTION 61% COMPARISON:  Multiple exams, including 11/12/2015 chest radiograph and prior chest CT from 10/09/2014 FINDINGS: Mediastinum/Nodes: Prevascular lymph node 0.7 cm on image 25/2, formerly the same. No pathologic thoracic adenopathy identified. No specific esophageal abnormality observed. Lungs/Pleura: Mild biapical pleural parenchymal scarring. Emphysema. Herniations of both posterior hemidiaphragms containing adipose tissue, similar to the prior chest CT. There is adjacent atelectasis along with some faint ground-glass opacity in the right lower lobe which could reflect early pneumonia. Trace right pleural effusion. Small amount of fluid posteriorly in  the minor fissure. No worrisome lung mass. Upper abdomen: Unremarkable Musculoskeletal: Fatty atrophy of the much of the rotator cuff musculature bilaterally. Degenerative sternoclavicular arthropathy, right greater than left. Diffuse osseous metastatic disease in the thorax involving all bony structures. This represents a significant increase from 10/09/2014 and even appears increased compared to bone scan of 04/13/2015 IMPRESSION: 1. Progressive diffuse osseous metastatic disease, significantly worsening over the past 7 months. All bony structures in the thorax are affected. 2. No lung mass is identified. There herniations of both posterior hemidiaphragms containing adipose tissue. 3. In the right lower lobe adjacent to the herniation, there is some atelectasis and potentially some early pneumonia. 4. Emphysema. 5. Fatty atrophy of much of the rotator cuff musculature bilaterally. Electronically Signed   By: Van Clines M.D.   On: 11/12/2015 19:32    Microbiology: No results found for this or any previous visit (from the past 240 hour(s)).   Labs: Basic Metabolic Panel:  Recent Labs Lab 11/12/15 1734 11/13/15 0220 11/14/15 0827 11/14/15 1633  NA 135 135 137 137  K 4.0 4.1 3.9 4.5  CL 110 111 111 111  CO2 20* 19* 20* 21*  GLUCOSE 115* 104* 120* 107*  BUN 18 19 14 16   CREATININE 1.06 1.11 1.11 1.25*  CALCIUM 8.7* 8.6* 8.5* 8.8*   Liver Function Tests:  Recent Labs Lab 11/14/15 1633  AST 27  ALT 16*  ALKPHOS 918*  BILITOT 0.2*  PROT 6.8  ALBUMIN 3.5   No results for input(s): LIPASE, AMYLASE in the last 168 hours. No results for input(s): AMMONIA in the last 168 hours. CBC:  Recent Labs Lab 11/12/15 1734 11/13/15 0220 11/14/15 0827 11/15/15 0451  WBC 6.7 6.1 6.5 5.7  HGB 11.6* 11.1* 11.2* 11.1*  HCT 35.4* 34.0* 34.0* 33.6*  MCV 82.5 82.5 79.8 80.2  PLT 224 216 192 188   Cardiac Enzymes: No results for input(s): CKTOTAL, CKMB, CKMBINDEX, TROPONINI in the last  168 hours. BNP: BNP (last 3 results) No results for input(s): BNP in the last 8760 hours.  ProBNP (last 3 results) No results  for input(s): PROBNP in the last 8760 hours.  CBG: No results for input(s): GLUCAP in the last 168 hours.     Signed:  Kelvin Cellar MD.  [facility] 11/15/2015, 11:32 AM

## 2015-11-15 NOTE — Progress Notes (Signed)
Daily Progress Note   Patient Name: Billy Lopez.       Date: 11/15/2015 DOB: 1941/06/01  Age: 75 y.o. MRN#: [id number] Attending Physician: Kelvin Cellar, MD Primary Care Physician: [name], MD Admit Date: 11/12/2015  Reason for Consultation/Follow-up: Pain control  Subjective:  patient resting in bed, in no distress. States he rested well overnight. He is looking forward to his ex wife and daughter visiting today.   Length of Stay: 3  Current Medications: Scheduled Meds:  . dolutegravir  50 mg Oral Daily  . emtricitabine-tenofovir AF  1 tablet Oral QHS  . enalapril  20 mg Oral Daily  . fentaNYL  25 mcg Transdermal Q72H  . ipratropium-albuterol  3 mL Nebulization BID  . levofloxacin (LEVAQUIN) IV  750 mg Intravenous Q24H  . potassium chloride  10 mEq Oral Daily  . senna  1 tablet Oral QHS  . sodium chloride flush  3 mL Intravenous Q12H  . tamsulosin  0.4 mg Oral BID    Continuous Infusions:    PRN Meds: acetaminophen **OR** acetaminophen, albuterol, bisacodyl, oxyCODONE  Physical Exam         NAD Breathing well S1 S2 Abdomen soft No edema Non focal  Vital Signs: BP 131/80 mmHg  Pulse 64  Temp(Src) 98.6 F (37 C) (Oral)  Resp 18  Ht 5\' 7"  (1.702 m)  Wt 74.2 kg (163 lb 9.3 oz)  BMI 25.61 kg/m2  SpO2 99% SpO2: SpO2: 99 % O2 Device: O2 Device: Not Delivered O2 Flow Rate:    Intake/output summary:  Intake/Output Summary (Last 24 hours) at 11/15/15 1048 Last data filed at 11/15/15 [id number]  Gross per 24 hour  Intake    150 ml  Output   1020 ml  Net   -870 ml   LBM: Last BM Date: 11/14/15 Baseline Weight: Weight: 74.2 kg (163 lb 9.3 oz) Most recent weight: Weight: 74.2 kg (163 lb 9.3 oz)       Palliative Assessment/Data:    Flowsheet Rows    Most Recent Value   Intake Tab    Referral Department  Hospitalist   Unit at Time of Referral  Oncology Unit   Palliative Care Primary Diagnosis  Cancer   Date Notified  11/14/15   Palliative Care Type  Return patient Palliative Care   Reason  for referral  Pain   Date of Admission  11/12/15   Date first seen by Palliative Care  11/14/15   # of days Palliative referral response time  0 Day(s)   # of days IP prior to Palliative referral  2   Clinical Assessment    Palliative Performance Scale Score  40%   Pain Max last 24 hours  8   Pain Min Last 24 hours  5   Dyspnea Max Last 24 Hours  0   Dyspnea Min Last 24 hours  0   Nausea Max Last 24 Hours  0   Nausea Min Last 24 Hours  0   Anxiety Max Last 24 Hours  0   Anxiety Min Last 24 Hours  0   Other Max Last 24 Hours  0   Psychosocial & Spiritual Assessment    Palliative Care Outcomes    Patient/Family meeting held?  Yes   Who was at the meeting?  patient.    Palliative Care Outcomes  Improved pain interventions   Other Treatment Preference Instructions  needs FU with oncologist, Dr. Alen Blew, and Wadie Lessen NP for on-going pain mgt      Patient Active Problem List   Diagnosis Date Noted  . Palliative care encounter   . CAP (community acquired pneumonia) 11/12/2015  . BRBPR (bright red blood per rectum) 11/12/2015  . Prostate cancer metastatic to bone ([place]) 08/30/2015  . CKD (chronic kidney disease) stage 3, GFR 30-59 ml/min 06/28/2015  . Pseudogout   . Septic joint of left shoulder region ([place]) 06/15/2015  . Arthritis, shoulder region 06/14/2015  . Left shoulder pain 06/14/2015  . Fever 06/14/2015  . Peripheral edema 11/09/2014  . Constipation 11/09/2014  . Periprosthetic fracture around internal prosthetic left hip joint ([place]) 10/04/2014  . Cough 04/19/2014  . Crossover toe 03/31/2014  . Encounter for long-term (current) use of medications 03/30/2014  . Screening examination for venereal disease 03/30/2014  . Cocked-up  toe of right foot 10/14/2013  . Arthritis 06/17/2013  . 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 Hematochezia 10/14/2012  . Hx of radiation therapy   . Stage T2a adenocarcinoma of the prostate with a Gleason's score of 3+4 and a PSA of 5.61 10/14/2011  . 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 Chronic pain 07/26/2011  . Preventative health care 07/21/2011  . 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 Allergic rhinitis, cause unspecified 07/21/2011  . Cervical spondylosis 07/21/2011  . Lumbar spondylosis 07/21/2011  . Osteoarthritis 07/21/2011  . COPD (chronic obstructive pulmonary disease) ([place]) 07/21/2011  . Substance abuse 03/22/2011  . ROTATOR CUFF SYNDROME, RIGHT 07/09/2010  . 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 DECREASED HEARING, BILATERAL 09/08/2008  . Human immunodeficiency virus (HIV) disease ([place]) 10/30/2006  . Chronic hepatitis C without hepatic coma ([place]) 10/30/2006  . Essential hypertension 10/30/2006  . LOW BACK PAIN SYNDROME 10/30/2006  . Hepatitis B 10/30/2006    Palliative Care Assessment & Plan   Patient Profile:   Assessment:  Seen by palliative for uncontrolled pain Prostate cancer bone mets History of HIV, COPD, HTN  Recommendations/Plan:   Continue with trans dermal fentanyl 25 mcg/72 hours. Also continue Oxy IR PRN. No acute indication for Pamidronate this am. Continue bowel regimen.  Goals of Care and Additional Recommendations:  Recommend out patient follow up with Dr Alen Blew.   Code Status:    Code Status Orders        Start     Ordered   11/12/15 2151  Full  code   Continuous     11/12/15 2150    Code Status History    Date Active Date Inactive Code Status Order ID Comments User Context   06/15/2015  5:29 PM 06/19/2015  5:30 PM Full Code [id number]  Marchia Bond, MD Inpatient   06/14/2015  8:33 PM 06/15/2015  5:29 PM Full Code [id number]  Radene Gunning, NP Inpatient   10/08/2014  3:05 PM 10/13/2014  3:58 PM Full Code [id number]  Paralee Cancel, MD  Inpatient   10/04/2014  4:57 PM 10/08/2014  3:05 PM Full Code [id number]  Latanya Maudlin, MD Inpatient   10/04/2014  4:54 PM 10/04/2014  4:57 PM Full Code [id number]  Albertine Patricia, MD Inpatient   03/31/2014 11:11 AM 04/01/2014 12:02 PM Full Code [id number]  Wylene Simmer, MD Inpatient       Prognosis:   Unable to determine  Discharge Planning:  Home with [place] was discussed with  Patient, Dr Coralyn Pear.   Thank you for allowing the Palliative Medicine Team to assist in the care of this patient.   Time In:  10 Time Out: 1025 Total Time  25 Prolonged Time Billed  no       Greater than 50%  of this time was spent counseling and coordinating care related to the above assessment and plan.  Loistine Chance, MD [phone]  Please contact Palliative Medicine Team phone at [phone] for questions and concerns.

## 2015-11-21 ENCOUNTER — Telehealth: Payer: Self-pay | Admitting: *Deleted

## 2015-11-21 ENCOUNTER — Encounter: Payer: Self-pay | Admitting: *Deleted

## 2015-11-21 NOTE — Telephone Encounter (Signed)
Patient called stating he was just discharged from the hospital for pneumonia and he is in pain and needs an Rx for Hydrocodone. Advised him to call the provider that prescribed the pain medication and he agreed to this. He does have an upcoming appt at [facility] on 11/28/15. Myrtis Hopping

## 2015-11-21 NOTE — Telephone Encounter (Signed)
Received vm call from pt stating that he is in a lot of pain & just got out of the hospital.  He is having pain on his R side, shoulder, back & chest & was prescribed hydrocodone & told to f/u with Dr Alen Blew.  Message routed to Dr Corliss Parish RN.  Call back # is [phone]

## 2015-11-22 NOTE — Telephone Encounter (Signed)
Per dr Alen Blew, medication not prescribed by [place]. He will need to contact his PCP or his urologist. Patient verbalized understanding.

## 2015-11-28 ENCOUNTER — Encounter: Payer: Self-pay | Admitting: Internal Medicine

## 2015-11-28 ENCOUNTER — Encounter (HOSPITAL_COMMUNITY): Payer: Self-pay | Admitting: Emergency Medicine

## 2015-11-28 ENCOUNTER — Ambulatory Visit (INDEPENDENT_AMBULATORY_CARE_PROVIDER_SITE_OTHER): Payer: Commercial Managed Care - HMO | Admitting: Internal Medicine

## 2015-11-28 ENCOUNTER — Emergency Department (HOSPITAL_COMMUNITY): Payer: Commercial Managed Care - HMO

## 2015-11-28 ENCOUNTER — Emergency Department (HOSPITAL_COMMUNITY)
Admission: EM | Admit: 2015-11-28 | Discharge: 2015-11-28 | Disposition: A | Discharge status: Home / Self Care | Payer: Commercial Managed Care - HMO | Attending: Emergency Medicine | Admitting: Emergency Medicine

## 2015-11-28 VITALS — BP 140/80 | HR 86 | Temp 98.0°F | Resp 20 | Wt 164.0 lb

## 2015-11-28 VITALS — BP 120/81 | HR 80 | Temp 98.0°F | Ht 71.0 in | Wt 153.0 lb

## 2015-11-28 DIAGNOSIS — G8929 Other chronic pain: Secondary | ICD-10-CM

## 2015-11-28 DIAGNOSIS — F1123 Opioid dependence with withdrawal: Secondary | ICD-10-CM | POA: Diagnosis not present

## 2015-11-28 DIAGNOSIS — C7951 Secondary malignant neoplasm of bone: Secondary | ICD-10-CM | POA: Insufficient documentation

## 2015-11-28 DIAGNOSIS — M47816 Spondylosis without myelopathy or radiculopathy, lumbar region: Secondary | ICD-10-CM | POA: Diagnosis not present

## 2015-11-28 DIAGNOSIS — R0789 Other chest pain: Secondary | ICD-10-CM | POA: Diagnosis not present

## 2015-11-28 DIAGNOSIS — Z21 Asymptomatic human immunodeficiency virus [HIV] infection status: Secondary | ICD-10-CM | POA: Diagnosis not present

## 2015-11-28 DIAGNOSIS — C61 Malignant neoplasm of prostate: Secondary | ICD-10-CM | POA: Insufficient documentation

## 2015-11-28 DIAGNOSIS — N183 Chronic kidney disease, stage 3 (moderate): Secondary | ICD-10-CM | POA: Diagnosis not present

## 2015-11-28 DIAGNOSIS — Z87891 Personal history of nicotine dependence: Secondary | ICD-10-CM | POA: Diagnosis not present

## 2015-11-28 DIAGNOSIS — F1990 Other psychoactive substance use, unspecified, uncomplicated: Secondary | ICD-10-CM | POA: Insufficient documentation

## 2015-11-28 DIAGNOSIS — G893 Neoplasm related pain (acute) (chronic): Secondary | ICD-10-CM

## 2015-11-28 DIAGNOSIS — Z79899 Other long term (current) drug therapy: Secondary | ICD-10-CM | POA: Insufficient documentation

## 2015-11-28 DIAGNOSIS — J449 Chronic obstructive pulmonary disease, unspecified: Secondary | ICD-10-CM | POA: Insufficient documentation

## 2015-11-28 DIAGNOSIS — R9431 Abnormal electrocardiogram [ECG] [EKG]: Secondary | ICD-10-CM | POA: Insufficient documentation

## 2015-11-28 DIAGNOSIS — I129 Hypertensive chronic kidney disease with stage 1 through stage 4 chronic kidney disease, or unspecified chronic kidney disease: Secondary | ICD-10-CM | POA: Diagnosis not present

## 2015-11-28 DIAGNOSIS — B20 Human immunodeficiency virus [HIV] disease: Secondary | ICD-10-CM

## 2015-11-28 DIAGNOSIS — M1990 Unspecified osteoarthritis, unspecified site: Secondary | ICD-10-CM | POA: Insufficient documentation

## 2015-11-28 DIAGNOSIS — R079 Chest pain, unspecified: Secondary | ICD-10-CM | POA: Diagnosis not present

## 2015-11-28 DIAGNOSIS — I10 Essential (primary) hypertension: Secondary | ICD-10-CM | POA: Diagnosis not present

## 2015-11-28 DIAGNOSIS — M47812 Spondylosis without myelopathy or radiculopathy, cervical region: Secondary | ICD-10-CM | POA: Insufficient documentation

## 2015-11-28 LAB — CBC
HCT: 39.5 % (ref 39.0–52.0)
HEMOGLOBIN: 13 g/dL (ref 13.0–17.0)
MCH: 26.9 pg (ref 26.0–34.0)
MCHC: 32.9 g/dL (ref 30.0–36.0)
MCV: 81.8 fL (ref 78.0–100.0)
Platelets: 235 10*3/uL (ref 150–400)
RBC: 4.83 MIL/uL (ref 4.22–5.81)
RDW: 15.2 % (ref 11.5–15.5)
WBC: 4.7 10*3/uL (ref 4.0–10.5)

## 2015-11-28 LAB — BASIC METABOLIC PANEL
ANION GAP: 7 (ref 5–15)
BUN: 25 mg/dL — ABNORMAL HIGH (ref 6–20)
CALCIUM: 9.1 mg/dL (ref 8.9–10.3)
CHLORIDE: 107 mmol/L (ref 101–111)
CO2: 20 mmol/L — AB (ref 22–32)
Creatinine, Ser: 1.38 mg/dL — ABNORMAL HIGH (ref 0.61–1.24)
GFR calc non Af Amer: 49 mL/min — ABNORMAL LOW (ref >60)
GFR, EST AFRICAN AMERICAN: 57 mL/min — AB (ref >60)
Glucose, Bld: 108 mg/dL — ABNORMAL HIGH (ref 65–99)
Potassium: 3.8 mmol/L (ref 3.5–5.1)
SODIUM: 134 mmol/L — AB (ref 135–145)

## 2015-11-28 LAB — I-STAT TROPONIN, ED: TROPONIN I, POC: 0 ng/mL (ref 0.00–0.08)

## 2015-11-28 LAB — TROPONIN I

## 2015-11-28 MED ORDER — HYDROMORPHONE HCL 1 MG/ML IJ SOLN
1.0000 mg | Freq: Once | INTRAMUSCULAR | Status: AC
  Administered 2015-11-28: 1 mg via INTRAVENOUS
  Filled 2015-11-28: qty 1

## 2015-11-28 MED ORDER — HYDROCODONE-ACETAMINOPHEN 5-325 MG PO TABS
2.0000 | ORAL_TABLET | Freq: Once | ORAL | Status: AC
  Administered 2015-11-28: 2 via ORAL
  Filled 2015-11-28: qty 2

## 2015-11-28 MED ORDER — HYDROMORPHONE HCL 2 MG PO TABS
2.0000 mg | ORAL_TABLET | Freq: Four times a day (QID) | ORAL | Status: DC | PRN
Start: 2015-11-28 — End: 2016-01-09

## 2015-11-28 NOTE — ED Notes (Addendum)
Pt states that he has had chest pain and pain in his back since 6/11 but went for a f/u with his PCP today who stated that his EKG did not look 'right' and to come in for evaluation. States he was admitted 6/11-6/14 for PNA and the chest pain. Hx of bone CA. Alert and oriented.

## 2015-11-28 NOTE — ED Notes (Signed)
[facility] called for transport.  

## 2015-11-28 NOTE — Progress Notes (Signed)
CC: Follow up for HIV  Interval history: Currently is asymptomatic and well-controlled on Tivicay and Descovy.  Since last visit he has had no missed doses and viral load remains undetectable.  His last CD4 was 130.  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  Also was recently hospitalized for pneumonia.    Prior to Admission medications   Medication Sig Start Date End Date Taking? Authorizing Provider  acetaminophen (TYLENOL) 325 MG tablet Take 2 tablets (650 mg total) by mouth every 6 (six) hours as needed for mild pain (or Fever >/= 101). 10/13/14  Yes Robbie Lis, MD  aspirin EC 325 MG EC tablet Take 1 tablet (325 mg total) by mouth 2 (two) times daily. 10/13/14  Yes Robbie Lis, MD  bisacodyl (DULCOLAX) 5 MG EC tablet Take 1 tablet (5 mg total) by mouth daily as needed for moderate constipation. 10/13/14  Yes Robbie Lis, MD  cholecalciferol (VITAMIN D) 1000 UNITS tablet Take 1,000 Units by mouth every evening.   Yes Historical Provider, MD  dolutegravir (TIVICAY) 50 MG tablet Take 1 tablet (50 mg total) by mouth daily. 05/22/15  Yes Thayer Headings, MD  emtricitabine-tenofovir AF (DESCOVY) 200-25 MG tablet Take 1 tablet by mouth at bedtime. 05/22/15  Yes Thayer Headings, MD  feeding supplement, ENSURE COMPLETE, (ENSURE COMPLETE) LIQD Take 237 mLs by mouth 2 (two) times daily between meals. 04/12/15  Yes Biagio Borg, MD  furosemide (LASIX) 20 MG tablet Take 1 tablet (20 mg total) by mouth daily. 11/09/14  Yes Biagio Borg, MD  HYDROcodone-acetaminophen [facility]) 10-325 MG tablet take 1/2 to 1 tablet by mouth every 8 hours if needed 06/28/15  Yes Biagio Borg, MD  potassium chloride (K-DUR) 10 MEQ tablet Take 1 tablet (10 mEq total) by mouth daily. 11/23/14  Yes Biagio Borg, MD  predniSONE (DELTASONE) 20 MG tablet Take 1 tablet (20 mg total) by mouth daily with breakfast. 06/28/15  Yes Truman Hayward, MD  tamsulosin [facility]) 0.4 MG CAPS take 1 capsule by mouth twice a day 10/01/12  Yes Franchot Gallo, MD  vitamin A 10000 UNIT capsule Take 10,000 Units by mouth every evening.   Yes Historical Provider, MD  vitamin C (ASCORBIC ACID) 500 MG tablet Take 500 mg by mouth every evening.   Yes Historical Provider, MD    Review of Systems Constitutional: negative for fevers and chills; has chronic pain Gastrointestinal: negative for diarrhea and constipation Musculoskeletal: negative for myalgias and arthralgias All other systems reviewed and are negative   Physical Exam: CONSTITUTIONAL:in no apparent distress and alert  Eyes: anicteric HENT: no thrush, no cervical lymphadenopathy Respiratory: Normal respiratory effort; CTA B  Lab Results  Component Value Date   HIV1RNAQUANT <20 06/14/2015   HIV1RNAQUANT <20 05/16/2015   HIV1RNAQUANT <20 09/19/2014   No components found for: HIV1GENOTYPRPLUS No components found for: THELPERCELL

## 2015-11-28 NOTE — ED Provider Notes (Signed)
CSN: [id number]     Arrival date & time 11/28/15  1702 History   First MD Initiated Contact with Patient 11/28/15 1752     Chief Complaint  Patient presents with  . Chest Pain  . Abnormal ECG     (Consider location/radiation/quality/duration/timing/severity/associated sxs/prior Treatment) HPI Patient states that he has been developing worsening pain in  His thoracic back. He reports pain in his shoulders. Pain is severe, sharp and aching. He also reports he gets pains in his arms. Pain has been worsening since his last hospitalization. He has not developed fever, cough or productive sputum. No hemoptysis. He reports the only thing that really helped his pain was when he was in the hospital and he got accommodation of Dilaudid and Vicodin. He denies lower extremity calf swelling or pain. He reports he was at his doctor's office and he had an abnormal EKG. He is referred to the emergency department for rule out of MI. Past Medical History  Diagnosis Date  . Hypertension   . HIV infection ([place])   . Substance abuse   . 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 Allergic rhinitis, cause unspecified 07/21/2011  . Cervical spondylosis 07/21/2011  . Lumbar spondylosis 07/21/2011  . Osteoarthritis 07/21/2011  . COPD (chronic obstructive pulmonary disease) ([place]) 07/21/2011  . 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 Hx of radiation therapy 12/17/11 - 02/13/12    prostate  . Prostate cancer ([place]) 09/19/11    gleason 7  . H/O blood clots   . Pneumonia   . GERD (gastroesophageal reflux disease)   . Hepatitis     Hep C  . Rectal bleeding   . Wears dentures     top  . Wears glasses   . Septic joint of left shoulder region ([place]) 06/15/2015  . CKD (chronic kidney disease) stage 3, GFR 30-59 ml/min 06/28/2015  . S/P radiation therapy 12/17/2011 through 02/13/2012    78 gray was delivered to the prostate in 40 fractions of 1.95 gray  . Prostate cancer metastatic to bone [facility]) 08/30/2015   Past Surgical History   Procedure Laterality Date  . Hip surgery      total bilateral replacement  . Hernia repair    . Hand surgery      right thumb  . Foot surgery      bilat. ingrown toenails removed  . Colonoscopy N/A 11/17/2012    Procedure: COLONOSCOPY;  Surgeon: Ladene Artist, MD;  Location: [facility];  Service: Endoscopy;  Laterality: N/A;  possible APC  . Amputation Right 03/31/2014    Procedure: RIGHT SECOND TOE AMPUTATION ;  Surgeon: Wylene [place], MD;  Location: [place];  Service: Orthopedics;  Laterality: Right;  . Total hip revision Left 10/08/2014    Procedure: POSTERIOR LEFT TOTAL HIP REVISION;  Surgeon: Paralee Cancel, MD;  Location: [facility];  Service: Orthopedics;  Laterality: Left;  . Shoulder arthroscopy Left 06/15/2015    Procedure: ARTHROSCOPIC SHOULDER IRRIGATION AND DEBRIDEMENT;  Surgeon: Marchia Bond, MD;  Location: [place];  Service: Orthopedics;  Laterality: Left;   Family History  Problem Relation Age of Onset  . Alcohol abuse Other   . Arthritis Other   . Hypertension Other   . Colon cancer Neg Hx   . Diabetes Neg Hx   . Cancer Mother     unsure of type   Social History  Substance Use Topics  . Smoking status: Former Smoker -- 1.00 packs/day for 10 years    Types: Cigarettes  Quit date: 10/13/1960  . Smokeless tobacco: Never Used  . Alcohol Use: No    Review of Systems 10 Systems reviewed and are negative for acute change except as noted in the HPI.    Allergies  Review of patient's allergies indicates no known allergies.  Home Medications   Prior to Admission medications   Medication Sig Start Date End Date Taking? Authorizing Provider  bisacodyl (DULCOLAX) 5 MG EC tablet Take 1 tablet (5 mg total) by mouth daily as needed for moderate constipation. 10/13/14  Yes Robbie Lis, MD  cholecalciferol (VITAMIN D) 1000 UNITS tablet Take 1,000 Units by mouth daily.    Yes Historical Provider, MD  Cyanocobalamin (VITAMIN B-12 PO) Take 1 tablet by mouth  daily.   Yes Historical Provider, MD  dolutegravir (TIVICAY) 50 MG tablet Take 1 tablet (50 mg total) by mouth daily. 05/22/15  Yes Thayer Headings, MD  emtricitabine-tenofovir AF (DESCOVY) 200-25 MG tablet Take 1 tablet by mouth at bedtime. 05/22/15  Yes Thayer Headings, MD  enalapril (VASOTEC) 20 MG tablet Take 1 tablet (20 mg total) by mouth daily. 11/06/15  Yes Biagio Borg, MD  feeding supplement, ENSURE COMPLETE, (ENSURE COMPLETE) LIQD Take 237 mLs by mouth 2 (two) times daily between meals. 04/12/15  Yes Biagio Borg, MD  fentaNYL (DURAGESIC - DOSED MCG/HR) 25 MCG/HR patch Place 1 patch (25 mcg total) onto the skin every 3 (three) days. 11/15/15  Yes Kelvin Cellar, MD  furosemide (LASIX) 20 MG tablet Take 20 mg by mouth daily. 11/08/15  Yes Historical Provider, MD  HYDROcodone-acetaminophen (NORCO) 10-325 MG tablet TAKE 1/2 TO 1 TABLET BY MOUTH EVERY 8 HOURS IF NEEDED FOR PAIN 11/01/15  Yes Historical Provider, MD  potassium chloride (K-DUR) 10 MEQ tablet Take 1 tablet (10 mEq total) by mouth daily. 11/23/14  Yes Biagio Borg, MD  tamsulosin (FLOMAX) 0.4 MG CAPS capsule Take 1 capsule (0.4 mg total) by mouth 2 (two) times daily. 08/22/15  Yes Biagio Borg, MD  traMADol (ULTRAM) 50 MG tablet Take 50 mg by mouth every 6 (six) hours as needed for moderate pain or severe pain.  11/06/15  Yes Historical Provider, MD  vitamin A 10000 UNIT capsule Take 10,000 Units by mouth every evening.   Yes Historical Provider, MD  vitamin C (ASCORBIC ACID) 500 MG tablet Take 500 mg by mouth every evening.   Yes Historical Provider, MD  acetaminophen (TYLENOL) 325 MG tablet Take 2 tablets (650 mg total) by mouth every 6 (six) hours as needed for mild pain (or Fever >/= 101). 10/13/14   Robbie Lis, MD  HYDROmorphone (DILAUDID) 2 MG tablet Take 1 tablet (2 mg total) by mouth every 6 (six) hours as needed for severe pain. 11/28/15   Charlesetta Shanks, MD  oxyCODONE (OXY IR/ROXICODONE) 5 MG immediate release tablet Take 1 tablet  (5 mg total) by mouth every 6 (six) hours as needed for moderate pain. 11/15/15   Kelvin Cellar, MD   BP 108/82 mmHg  Pulse 77  Temp(Src) 98.6 F (37 C) (Oral)  Resp 14  SpO2 93% Physical Exam  Constitutional: He is oriented to person, place, and time. He appears well-developed and well-nourished.  HENT:  Head: Normocephalic and atraumatic.  Mouth/Throat: Oropharynx is clear and moist.  Eyes: EOM are normal. Pupils are equal, round, and reactive to light.  Neck: Neck supple.  Cardiovascular: Normal rate, regular rhythm, normal heart sounds and intact distal pulses.   Pulmonary/Chest: Effort normal and  breath sounds normal. He exhibits tenderness.  Abdominal: Soft. Bowel sounds are normal. He exhibits no distension. There is tenderness.  Epigastric tenderness to palpation. No guarding or rebound.  Musculoskeletal: Normal range of motion. He exhibits no edema or tenderness.  Neurological: He is alert and oriented to person, place, and time. He has normal strength. He exhibits normal muscle tone. Coordination normal. GCS eye subscore is 4. GCS verbal subscore is 5. GCS motor subscore is 6.  Skin: Skin is warm, dry and intact.  Psychiatric: He has a normal mood and affect.    ED Course  Procedures (including critical care time) Labs Review Labs Reviewed  BASIC METABOLIC PANEL - Abnormal; Notable for the following:    Sodium 134 (*)    CO2 20 (*)    Glucose, Bld 108 (*)    BUN 25 (*)    Creatinine, Ser 1.38 (*)    GFR calc non Af Amer 49 (*)    GFR calc Af Amer 57 (*)    All other components within normal limits  CBC  TROPONIN I  Randolm Idol, ED    Imaging Review Dg Chest 2 View  11/28/2015  CLINICAL DATA:  Chest pain EXAM: CHEST  2 VIEW COMPARISON:  11/14/2015 FINDINGS: Normal heart size. No pleural effusion or edema identified. The lung volumes are decreased and there is asymmetric elevation of the right hemidiaphragm. Right midlung and right base platelike atelectasis  identified. Multifocal sclerotic bone lesions are again identified compatible with metastatic disease. IMPRESSION: 1. Right midlung and right base atelectasis. Electronically Signed   By: Kerby Moors M.D.   On: 11/28/2015 18:04   I have personally reviewed and evaluated these images and lab results as part of my medical decision-making.   EKG Interpretation   Date/Time:  Tuesday November 28 2015 17:52:09 EDT Ventricular Rate:  75 PR Interval:    QRS Duration: 90 QT Interval:  399 QTC Calculation: 446 R Axis:   -22 Text Interpretation:  Sinus rhythm LVH with secondary repolarization  abnormality agree. Confirmed by Johnney Killian, MD, Jeannie Done [id number]) on 11/28/2015  7:26:56 PM      MDM   Final diagnoses:  Cancer associated pain  Cancer, metastatic to bone [facility])   Patient presents with diffuse thoracic pain that has a very musculoskeletal component to it, is worse with certain movements and variable in its location including his upper extremities. Patient does not have typical associated ischemic symptoms. 2 sets of cardiac enzymes are negative for MI. Patient does not have fever or signs of an acute pneumonia. He also does not have signs suggestive PE. Calves are soft and nontender. Patient's pain is in variable locations in the thoracic and extremity area. At this time I believe the pain is secondary to metastatic bone lesions. She reported relief with treatment of Dilaudid. He did treat him with Dilaudid in the emergency department and patient got good pain control. At this time I will order a short course of oral Dilaudid through pain for the patient. He is instructed he needs to consult with his oncologist and primary provider regarding pain management ongoing. At discharge patient is alert and in no acute distress. Vital Signs are stable.    Charlesetta Shanks, MD 11/28/15 2140

## 2015-11-28 NOTE — Assessment & Plan Note (Signed)
Pain likely from bone mets.  Has been referred to pain management.

## 2015-11-28 NOTE — Patient Instructions (Signed)
Your EKG has changed from recent EKG  Please go to the ER now for the recurring chest pain  Please continue all other medications as before, and refills have been done if requested.  Please have the pharmacy call with any other refills you may need.  Please keep your appointments with your specialists as you may have planned

## 2015-11-28 NOTE — Progress Notes (Signed)
Pre visit review using our clinic review tool, if applicable. No additional management support is needed unless otherwise documented below in the visit note. 

## 2015-11-28 NOTE — Assessment & Plan Note (Signed)
By hx seems most c/w msk or pleuritic, but ECG shows change with lateral ST and T wave depression not present on most recent ECG June 11.  Will ask pt to go to ER now, to r/o NSTEMI

## 2015-11-28 NOTE — Assessment & Plan Note (Signed)
Doing well.  Will check his labs today and rtc 6 months unless concerns.

## 2015-11-28 NOTE — Discharge Instructions (Signed)
Bone Metastasis Bone metastasis is cancer that spreads to the bones from another part of the body. A person may have bone metastasis in one bone or in more than one bone. Cancer that spreads to the bones is different from cancer that starts in the bones (primary bone cancer). Bone metastasis is more common than primary bone cancer. The spine is the most common area for bone metastasis. Other common areas include:  Hip bone (pelvis).  Ribs.  Skull.  Long bones of the arm or leg. Bone metastasis is painful, and it damages the bones. Bone metastasis damages and weakens bones in two ways. A person may have bone destruction (osteolytic damage) or abnormal bone growth (osteoblastic destruction). Both of these conditions can make bones so weak that they break (pathologic fracture) even from a minor injury. CAUSES This condition is caused by cancer cells that spread to bone. These cells can get into your bloodstream and spread through your body. They can also get into the vessels that are part of your lymphatic system (lymph vessels) and spread that way. RISK FACTORS This condition is more likely to develop in people who have an advanced type of cancer that is known to spread to bone. Cancers that often spread to bone include:  Breast cancer.  Prostate cancer.  Lung cancer.  Thyroid cancer.  Kidney cancer. SYMPTOMS The most common symptom of this condition is bone pain, especially while you are resting. Other symptoms include:  A broken bone (fracture) that happens with little or no trauma.  Low number of red blood cells (anemia). Bone destruction may damage the spongy tissue (bone marrow) in the center of some bones where red blood cells are produced. Anemia can cause:  Weakness.  Shortness of breath.  Headache.  Dizziness.  Back or neck pain with numbness or weakness, especially if you have bone metastasis in your spine.  High levels of calcium in your blood (hypercalcemia). When  bone is destroyed, calcium is released into your blood. Symptoms of hypercalcemia include:  Constipation.  Thirst.  Nausea.  Sleepiness. DIAGNOSIS This condition may be diagnosed based on:  Your symptoms and medical history. Your health care provider may suspect this condition if you are being treated for cancer or have had cancer treatment in the past.  A physical exam.  Imaging studies, such as:  Bone X-rays, especially in the area where you have pain.  CT scan.  Bone scan.  MRI.  Blood tests to check for anemia or hypercalcemia.  A procedure to remove a piece of bone so it can be examined under a microscope (biopsy). TREATMENT Treatment for this condition depends on your overall health, the type of cancer you have, and how much the cancer has spread. You will work with a team of health care providers to determine which treatment is best for you. Treatment will focus on managing pain, preventing bone weakness, and slowing the spread of the cancer. Treatment may include:  Radiation therapy. This treatment uses X-rays to kill cancer cells. It is most effective for reducing pain, stopping tumor growth, and lowering the risk of fractures.  Radioisotope therapy. This treatment uses a radioactive medicine that is injected into your blood. The medicine travels to areas where cancer cells are active and kills them.  Chemotherapy. For this treatment, you are given cancer-killing drugs. You may have chemotherapy in cycles, with rest periods in between.  Medicines to block cells that destroy bone (bisphosphonates and denosumab). These medicines are used to control bone  pain. They may help to reduce hypercalcemia.  Medicines to reduce pain (opiates).  Endocrine therapies. These therapies slow cancer growth by blocking specific chemical messengers (hormones). Some types of cancer, including breast and prostate cancers, depend on hormones.  Targeted therapies. These therapies involve  the use of drugs that block the growth and spread of cancer. This treatment is different from standard chemotherapy.  Immunotherapies. These therapies use the body's defense system (immune system) to fight cancer cells.  Surgery. You may have surgery to remove bone cancer or to prevent or repair a fracture. HOME CARE INSTRUCTIONS  Take medicines only as directed by your health care provider.  Do not drive or operate heavy machinery while taking pain medicine.  Take the following steps to help prevent constipation, which can result from some pain medicines.  Include plenty of fruits and whole grains in your diet.  Drink enough fluid to keep your urine clear or pale yellow.  Ask your health care provider about taking a laxative if needed.  Keep all follow-up visits as directed by your health care provider. This is important. SEEK MEDICAL CARE IF:  Your pain medicine is not helping.  You are too weak to care for yourself at home. SEEK IMMEDIATE MEDICAL CARE IF:  You fall and have pain or an injury.  You have trouble walking.  You have numbness or tingling in your legs.  Your pain suddenly gets worse.  You lose control of your bowels or your bladder.  You are very sleepy or confused.   This information is not intended to replace advice given to you by your health care provider. Make sure you discuss any questions you have with your health care provider.

## 2015-11-28 NOTE — Assessment & Plan Note (Signed)
stable overall by history and exam, recent data reviewed with pt, and pt to continue medical treatment as before,  to f/u any worsening symptoms or concerns BP Readings from Last 3 Encounters:  11/28/15 140/80  11/28/15 120/81  11/15/15 131/80

## 2015-11-28 NOTE — ED Notes (Signed)
Bible provided per pt request

## 2015-11-28 NOTE — Progress Notes (Signed)
Subjective:    Patient ID: Billy Lopez., male    DOB: January 08, 1941, 75 y.o.   MRN: [id number]  HPI  Here after recent hospn earlier this month with pna, has persistent back/right chest and right arm pain,  Dull and sharp, some worse with twisting at the waist in his scooter and pleuritic, is positional as is worse with getting up from lying down, o/w nonexertional , Pain is not assoc with sob, but has been sweaty and nauseas earlier today.  No dizziness but has had electrical shock type pain to the left head earlier today now resolved.  Also mentions small volume BRBPR 2 days ago but seemed thick.  Nervous today, states "I feel worse than when i had the pna."   Pt denies increased sob or doe, wheezing, orthopnea, PND, increased LE swelling, palpitations, or syncope.  Denies worsening reflux, abd pain, dysphagia  Past Medical History  Diagnosis Date  . Hypertension   . HIV infection ([place])   . Substance abuse   . 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 Allergic rhinitis, cause unspecified 07/21/2011  . Cervical spondylosis 07/21/2011  . Lumbar spondylosis 07/21/2011  . Osteoarthritis 07/21/2011  . COPD (chronic obstructive pulmonary disease) ([place]) 07/21/2011  . 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 Hx of radiation therapy 12/17/11 - 02/13/12    prostate  . Prostate cancer ([place]) 09/19/11    gleason 7  . H/O blood clots   . Pneumonia   . GERD (gastroesophageal reflux disease)   . Hepatitis     Hep C  . Rectal bleeding   . Wears dentures     top  . Wears glasses   . Septic joint of left shoulder region ([place]) 06/15/2015  . CKD (chronic kidney disease) stage 3, GFR 30-59 ml/min 06/28/2015  . S/P radiation therapy 12/17/2011 through 02/13/2012    78 gray was delivered to the prostate in 40 fractions of 1.95 gray  . Prostate cancer metastatic to bone [facility]) 08/30/2015   Past Surgical History  Procedure Laterality Date  . Hip surgery      total bilateral replacement  . Hernia repair    . Hand  surgery      right thumb  . Foot surgery      bilat. ingrown toenails removed  . Colonoscopy N/A 11/17/2012    Procedure: COLONOSCOPY;  Surgeon: Ladene Artist, MD;  Location: [facility];  Service: Endoscopy;  Laterality: N/A;  possible APC  . Amputation Right 03/31/2014    Procedure: RIGHT SECOND TOE AMPUTATION ;  Surgeon: Wylene Simmer, MD;  Location: [facility];  Service: Orthopedics;  Laterality: Right;  . Total hip revision Left 10/08/2014    Procedure: POSTERIOR LEFT TOTAL HIP REVISION;  Surgeon: Paralee Cancel, MD;  Location: [facility];  Service: Orthopedics;  Laterality: Left;  . Shoulder arthroscopy Left 06/15/2015    Procedure: ARTHROSCOPIC SHOULDER IRRIGATION AND DEBRIDEMENT;  Surgeon: Marchia Bond, MD;  Location: [facility];  Service: Orthopedics;  Laterality: Left;    reports that he quit smoking about 55 years ago. His smoking use included Cigarettes. He has a 10 pack-year smoking history. He has never used smokeless tobacco. He reports that he does not drink alcohol or use illicit drugs. family history includes Alcohol abuse in his other; Arthritis in his other; Cancer in his mother; Hypertension in his other. There is no history of Colon cancer or Diabetes. No Known Allergies Current Outpatient Prescriptions on File Prior to Visit  Medication  Sig Dispense Refill  . acetaminophen (TYLENOL) 325 MG tablet Take 2 tablets (650 mg total) by mouth every 6 (six) hours as needed for mild pain (or Fever >/= 101). 30 tablet 0  . bisacodyl (DULCOLAX) 5 MG EC tablet Take 1 tablet (5 mg total) by mouth daily as needed for moderate constipation. 30 tablet 0  . cholecalciferol (VITAMIN D) 1000 UNITS tablet Take 1,000 Units by mouth every evening.    . dolutegravir (TIVICAY) 50 MG tablet Take 1 tablet (50 mg total) by mouth daily. 30 tablet 6  . emtricitabine-tenofovir AF (DESCOVY) 200-25 MG tablet Take 1 tablet by mouth at bedtime. 30 tablet 6  . enalapril (VASOTEC) 20 MG tablet Take 1  tablet (20 mg total) by mouth daily. 30 tablet 0  . feeding supplement, ENSURE COMPLETE, (ENSURE COMPLETE) LIQD Take 237 mLs by mouth 2 (two) times daily between meals. 474 mL 11  . fentaNYL (DURAGESIC - DOSED MCG/HR) 25 MCG/HR patch Place 1 patch (25 mcg total) onto the skin every 3 (three) days. 10 patch 0  . oxyCODONE (OXY IR/ROXICODONE) 5 MG immediate release tablet Take 1 tablet (5 mg total) by mouth every 6 (six) hours as needed for moderate pain. 15 tablet 0  . potassium chloride (K-DUR) 10 MEQ tablet Take 1 tablet (10 mEq total) by mouth daily. 90 tablet 3  . tamsulosin (FLOMAX) 0.4 MG CAPS capsule Take 1 capsule (0.4 mg total) by mouth 2 (two) times daily. 60 capsule 2  . vitamin A 10000 UNIT capsule Take 10,000 Units by mouth every evening.    . vitamin C (ASCORBIC ACID) 500 MG tablet Take 500 mg by mouth every evening.     No current facility-administered medications on file prior to visit.    Review of Systems  Constitutional: Negative for unusual diaphoresis or night sweats HENT: Negative for ear swelling or discharge Eyes: Negative for worsening visual haziness  Respiratory: Negative for choking and stridor.   Gastrointestinal: Negative for distension or worsening eructation Genitourinary: Negative for retention or change in urine volume.  Musculoskeletal: Negative for other MSK pain or swelling Skin: Negative for color change and worsening wound Neurological: Negative for tremors and numbness other than noted  Psychiatric/Behavioral: Negative for decreased concentration or agitation other than above       Objective:   Physical Exam BP 140/80 mmHg  Pulse 86  Temp(Src) 98 F (36.7 C) (Oral)  Resp 20  Wt 164 lb (74.39 kg)  SpO2 95% VS noted,  Constitutional: Pt appears in no apparent distress HENT: Head: NCAT.  Right Ear: External ear normal.  Left Ear: External ear normal.  Eyes: . Pupils are equal, round, and reactive to light. Conjunctivae and EOM are  normal Neck: Normal range of motion. Neck supple.  Cardiovascular: Normal rate and regular rhythm.   Pulmonary/Chest: Effort normal and breath sounds without rales or wheezing.  Abd:  Soft, NT, ND, + BS Neurological: Pt is alert. Not confused , motor grossly intact Skin: Skin is warm. No rash, no LE edema Psychiatric: Pt behavior is normal. No agitation. 1-2+ nervous  Today ecg Sinus  Rhythm  Voltage criteria for LVH  (R(I)+S(III) exceeds 2.50 mV)  -Voltage criteria w/o ST/T abnormality may be normal.   -  T-abnormality  -Possible  Anterior/lateral  ischemia.   ABNORMAL      Assessment & Plan:

## 2015-11-29 LAB — HIV-1 RNA QUANT-NO REFLEX-BLD: HIV-1 RNA Quant, Log: <1.3 Log copies/mL (ref <1.30)

## 2015-11-29 LAB — T-HELPER CELL (CD4) - (RCID CLINIC ONLY)
CD4 T CELL ABS: 390 /uL — AB (ref 400–2700)
CD4 T CELL HELPER: 39 % (ref 33–55)

## 2015-12-07 ENCOUNTER — Other Ambulatory Visit: Payer: Self-pay | Admitting: *Deleted

## 2015-12-07 ENCOUNTER — Telehealth: Payer: Self-pay | Admitting: Internal Medicine

## 2015-12-07 ENCOUNTER — Telehealth: Payer: Self-pay | Admitting: Emergency Medicine

## 2015-12-07 MED ORDER — POTASSIUM CHLORIDE ER 10 MEQ PO TBCR: 10.0000 meq | EXTENDED_RELEASE_TABLET | Freq: Every day | ORAL | Status: AC

## 2015-12-07 MED ORDER — TAMSULOSIN HCL 0.4 MG PO CAPS: 0.4000 mg | ORAL_CAPSULE | Freq: Two times a day (BID) | ORAL | Status: AC

## 2015-12-07 NOTE — Telephone Encounter (Signed)
Pt request refill for oxyCODONE (OXY IR/ROXICODONE) 5 MG immediate release tablet. Please call him once its done.

## 2015-12-07 NOTE — Telephone Encounter (Signed)
Patients called and stated he needed a refill on his HYDROcodone-acetaminophen (NORCO) 10-325 MG tablet. Thanks.

## 2015-12-07 NOTE — Telephone Encounter (Signed)
Patient advised Dr. Jenny Reichmann stated this was not a medication to be continued

## 2015-12-08 ENCOUNTER — Telehealth: Payer: Self-pay | Admitting: Emergency Medicine

## 2015-12-08 NOTE — Telephone Encounter (Signed)
Patient called and stated he needed his HYDROcodone-acetaminophen (NORCO) 10-325 MG tablet refilled. Pharmacy is [facility]. Thanks.

## 2015-12-08 NOTE — Telephone Encounter (Signed)
Patient advised Dr. Jenny Reichmann has done referral to pain management and can not fill any further refills

## 2015-12-13 ENCOUNTER — Other Ambulatory Visit: Payer: Self-pay | Admitting: Internal Medicine

## 2015-12-13 DIAGNOSIS — B20 Human immunodeficiency virus [HIV] disease: Secondary | ICD-10-CM

## 2015-12-13 MED FILL — DESCOVY 200-25 MG TABS: 200-25 | 30 days supply | Qty: 30 | Fill #0

## 2015-12-13 MED FILL — TIVICAY 50 MG TABLET: 50 | 30 days supply | Qty: 30 | Fill #0

## 2015-12-18 DIAGNOSIS — B20 Human immunodeficiency virus [HIV] disease: Secondary | ICD-10-CM | POA: Diagnosis not present

## 2015-12-19 ENCOUNTER — Telehealth: Payer: Self-pay

## 2015-12-19 NOTE — Telephone Encounter (Signed)
Pt asking about his appt date he said he was changed from 7/21 to 7/27. No note is seen with this change. Confirmed to pt his appt is 7/21 at 0900.

## 2015-12-20 ENCOUNTER — Telehealth: Payer: Self-pay | Admitting: Oncology

## 2015-12-20 NOTE — Telephone Encounter (Signed)
Received a call from [facility] from [place]. To confirm appointment for patient.

## 2015-12-22 ENCOUNTER — Telehealth: Payer: Self-pay | Admitting: Emergency Medicine

## 2015-12-22 ENCOUNTER — Ambulatory Visit (HOSPITAL_BASED_OUTPATIENT_CLINIC_OR_DEPARTMENT_OTHER): Payer: Commercial Managed Care - HMO | Admitting: Oncology

## 2015-12-22 VITALS — BP 106/76 | HR 77 | Temp 98.6°F | Resp 18 | Ht 71.0 in | Wt 152.6 lb

## 2015-12-22 DIAGNOSIS — C7951 Secondary malignant neoplasm of bone: Secondary | ICD-10-CM

## 2015-12-22 DIAGNOSIS — C61 Malignant neoplasm of prostate: Secondary | ICD-10-CM | POA: Diagnosis not present

## 2015-12-22 NOTE — Telephone Encounter (Signed)
Very sorry, but I have not prescribed these medicaitons for him in the past  Pt should contact provider who provided the medications if he feels he needs refills

## 2015-12-22 NOTE — Telephone Encounter (Signed)
Please advise 

## 2015-12-22 NOTE — Progress Notes (Signed)
Hematology and Oncology Follow Up Visit  Billy Lopez [id number] [birth year] 75 y.o. 12/22/2015 9:50 AM [name], MDJohn, Hunt Oris, MD   Principle Diagnosis: 75 year old gentleman with prostate cancer. He was initially diagnosed in 2003 with a Gleason score 4+3 = 7 and a PSA 5.6.   Prior Therapy:  He received definitive radiation therapy with IMRT concluded in September 2013. He developed metastatic bony disease in November 2016 with a PSA of 256.  Current therapy: Lupron androgen deprivation given at [facility].  Interim History: [name] presents today for a follow-up visit. Since the last visit, he was hospitalized in June 2017 for community-acquired pneumonia and intractable pain. During his hospitalization, he was evaluated by palliative care and was started on fentanyl patch for pain management. This the last visit, he reports no respiratory symptoms at this time but continues to have pain issues. His pain is rather diffuse and vague in description. He reports this predominantly on the right side in his back, ribs and pelvic area. The type that remains reasonable and no other complaints. He continues to receive Lupron regular basis.   He does not report any headaches, blurry vision, syncope or seizures. He does not report any fevers, chills, sweats or weight loss. He does not report any chest pain, palpitation or orthopnea. Does not report any cough, wheezing or hemoptysis. Does not report any nausea, vomiting, abdominal pain. Does not report any hematochezia or melena. Does not report any frequency urgency or hesitancy. Remaining review of systems unremarkable.   Medications: I have reviewed the patient's current medications.  Current Outpatient Prescriptions  Medication Sig Dispense Refill  . acetaminophen (TYLENOL) 325 MG tablet Take 2 tablets (650 mg total) by mouth every 6 (six) hours as needed for mild pain (or Fever >/= 101). 30 tablet 0  . bisacodyl (DULCOLAX) 5 MG  EC tablet Take 1 tablet (5 mg total) by mouth daily as needed for moderate constipation. 30 tablet 0  . cholecalciferol (VITAMIN D) 1000 UNITS tablet Take 1,000 Units by mouth daily.     . Cyanocobalamin (VITAMIN B-12 PO) Take 1 tablet by mouth daily.    . DESCOVY 200-25 MG tablet TAKE 1 TABLET BY MOUTH AT BEDTIME. 30 tablet 6  . enalapril (VASOTEC) 20 MG tablet Take 1 tablet (20 mg total) by mouth daily. 30 tablet 0  . feeding supplement, ENSURE COMPLETE, (ENSURE COMPLETE) LIQD Take 237 mLs by mouth 2 (two) times daily between meals. 474 mL 11  . fentaNYL (DURAGESIC - DOSED MCG/HR) 25 MCG/HR patch Place 1 patch (25 mcg total) onto the skin every 3 (three) days. 10 patch 0  . furosemide (LASIX) 20 MG tablet Take 20 mg by mouth daily.  0  . potassium chloride (K-DUR) 10 MEQ tablet Take 1 tablet (10 mEq total) by mouth daily. 90 tablet 2  . tamsulosin (FLOMAX) 0.4 MG CAPS capsule Take 1 capsule (0.4 mg total) by mouth 2 (two) times daily. 60 capsule 5  . TIVICAY 50 MG tablet TAKE 1 TABLET (50 MG TOTAL) BY MOUTH DAILY. 30 tablet 6  . vitamin A 10000 UNIT capsule Take 10,000 Units by mouth every evening.    . vitamin C (ASCORBIC ACID) 500 MG tablet Take 500 mg by mouth every evening.    Marland Kitchen HYDROcodone-acetaminophen (NORCO) 10-325 MG tablet Reported on 12/22/2015  0  . HYDROmorphone (DILAUDID) 2 MG tablet Take 1 tablet (2 mg total) by mouth every 6 (six) hours as needed for severe pain. (Patient  not taking: Reported on 12/22/2015) 15 tablet 0  . oxyCODONE (OXY IR/ROXICODONE) 5 MG immediate release tablet Take 1 tablet (5 mg total) by mouth every 6 (six) hours as needed for moderate pain. (Patient not taking: Reported on 12/22/2015) 15 tablet 0  . traMADol (ULTRAM) 50 MG tablet Take 50 mg by mouth every 6 (six) hours as needed for moderate pain or severe pain. Reported on 12/22/2015  0   No current facility-administered medications for this visit.     Allergies: No Known Allergies  Past Medical History,  Surgical history, Social history, and Family History were reviewed and updated.  Physical Exam: Blood pressure 106/76, pulse 77, temperature 98.6 F (37 C), temperature source Oral, resp. rate 18, height 5\' 11"  (1.803 m), weight 152 lb 9.6 oz (69.219 kg), SpO2 95 %. ECOG: 2 General appearance: alert and cooperative Head: Normocephalic, without obvious abnormality Neck: no adenopathy Lymph nodes: Cervical, supraclavicular, and axillary nodes normal. Heart:regular rate and rhythm, S1, S2 normal, no murmur, click, rub or gallop Lung:chest clear, no wheezing, rales, normal symmetric air entry Abdomin: soft, non-tender, without masses or organomegaly EXT:no erythema, induration, or nodules   Lab Results: Lab Results  Component Value Date   WBC 4.7 11/28/2015   HGB 13.0 11/28/2015   HCT 39.5 11/28/2015   MCV 81.8 11/28/2015   PLT 235 11/28/2015     Chemistry      Component Value Date/Time   NA 134* 11/28/2015 1735   K 3.8 11/28/2015 1735   CL 107 11/28/2015 1735   CO2 20* 11/28/2015 1735   BUN 25* 11/28/2015 1735   CREATININE 1.38* 11/28/2015 1735   CREATININE 1.24* 06/14/2015 1019      Component Value Date/Time   CALCIUM 9.1 11/28/2015 1735   ALKPHOS 918* 11/14/2015 1633   AST 27 11/14/2015 1633   ALT 16* 11/14/2015 1633   BILITOT 0.2* 11/14/2015 1633       Impression and Plan:  75 year old gentleman with the following issues:  1. Advanced prostate cancer that is currently hormone sensitive. He was initially diagnosed in 2013 with a Gleason score 4+3 = 7 and a PSA. 5.6. He was treated with definitive therapy utilizing IMRT radiation therapy concluded in September 2013. He developed a advanced metastatic disease with bony metastasis confirmed by bone scan in November 2016 and his most recent PSA in January 2017 was 167 with testosterone of 22. He received Lupron 30 mg at that time.   The options of Adding Taxotere chemotherapy or Zytiga were discussed today. Risks and  benefits were reviewed and I feel the complications associated with these medication my not justify the benefit that he might gets. He is not a optimal candidate for chemotherapy. Adding another oral agent especially Zytiga might cause more complications related to drug interaction with the rest of his HIV medication. At this time, I feel androgen deprivation only is his best option that offers the least potential complications.  I recommended continuous follow-up for medical oncology standpoint to use second line hormonal therapy if he develops castration resistant disease.  2. Pain control: He is currently on fentanyl patch managed by his primary care physician. This can be titrated a higher doses if needed to. Radiation therapy can also be utilized in the form of external beam or Xofigo.  3. Follow-up: Will be in 4 months sooner if needed to.   [facility], MD 7/21/20179:50 AM

## 2015-12-22 NOTE — Telephone Encounter (Signed)
Pt called and needs a prescription refill for traMADol (ULTRAM) 50 MG tablet and fentaNYL (DURAGESIC - DOSED MCG/HR) 25 MCG/HR patch. He also stated he needed stronger patches this time. Please follow up thanks.

## 2015-12-23 NOTE — Telephone Encounter (Signed)
Patient aware that he will have to have the provider who prescribed these medications to refill them for him.

## 2016-01-01 ENCOUNTER — Other Ambulatory Visit: Payer: Self-pay | Admitting: Internal Medicine

## 2016-01-01 NOTE — Telephone Encounter (Signed)
Done hardcopy to Corinne  

## 2016-01-02 NOTE — Telephone Encounter (Signed)
Refill sent to pharmacy.   

## 2016-01-04 ENCOUNTER — Other Ambulatory Visit: Payer: Self-pay | Admitting: Internal Medicine

## 2016-01-04 NOTE — Telephone Encounter (Signed)
Done hardcopy to Corinne  

## 2016-01-05 NOTE — Telephone Encounter (Signed)
Rx faxed to pharmacy  

## 2016-01-08 ENCOUNTER — Emergency Department (HOSPITAL_COMMUNITY)
Admission: EM | Admit: 2016-01-08 | Discharge: 2016-01-09 | Disposition: A | Discharge status: Home / Self Care | Payer: Commercial Managed Care - HMO | Attending: Emergency Medicine | Admitting: Emergency Medicine

## 2016-01-08 ENCOUNTER — Emergency Department (HOSPITAL_COMMUNITY): Payer: Commercial Managed Care - HMO

## 2016-01-08 ENCOUNTER — Encounter (HOSPITAL_COMMUNITY): Payer: Self-pay | Admitting: Emergency Medicine

## 2016-01-08 DIAGNOSIS — I129 Hypertensive chronic kidney disease with stage 1 through stage 4 chronic kidney disease, or unspecified chronic kidney disease: Secondary | ICD-10-CM | POA: Insufficient documentation

## 2016-01-08 DIAGNOSIS — C7951 Secondary malignant neoplasm of bone: Secondary | ICD-10-CM | POA: Diagnosis not present

## 2016-01-08 DIAGNOSIS — Z87891 Personal history of nicotine dependence: Secondary | ICD-10-CM | POA: Diagnosis not present

## 2016-01-08 DIAGNOSIS — Z8546 Personal history of malignant neoplasm of prostate: Secondary | ICD-10-CM | POA: Insufficient documentation

## 2016-01-08 DIAGNOSIS — N183 Chronic kidney disease, stage 3 (moderate): Secondary | ICD-10-CM | POA: Diagnosis not present

## 2016-01-08 DIAGNOSIS — G8929 Other chronic pain: Secondary | ICD-10-CM | POA: Diagnosis present

## 2016-01-08 DIAGNOSIS — C61 Malignant neoplasm of prostate: Secondary | ICD-10-CM | POA: Diagnosis not present

## 2016-01-08 DIAGNOSIS — J189 Pneumonia, unspecified organism: Secondary | ICD-10-CM

## 2016-01-08 DIAGNOSIS — J449 Chronic obstructive pulmonary disease, unspecified: Secondary | ICD-10-CM | POA: Diagnosis not present

## 2016-01-08 DIAGNOSIS — R079 Chest pain, unspecified: Secondary | ICD-10-CM | POA: Diagnosis not present

## 2016-01-08 LAB — BASIC METABOLIC PANEL
Anion gap: 8 (ref 5–15)
BUN: 14 mg/dL (ref 6–20)
CHLORIDE: 110 mmol/L (ref 101–111)
CO2: 17 mmol/L — AB (ref 22–32)
CREATININE: 1.09 mg/dL (ref 0.61–1.24)
Calcium: 9.4 mg/dL (ref 8.9–10.3)
GFR calc non Af Amer: >60 mL/min (ref >60)
Glucose, Bld: 107 mg/dL — ABNORMAL HIGH (ref 65–99)
POTASSIUM: 3.5 mmol/L (ref 3.5–5.1)
Sodium: 135 mmol/L (ref 135–145)

## 2016-01-08 LAB — I-STAT TROPONIN, ED: Troponin i, poc: 0.01 ng/mL (ref 0.00–0.08)

## 2016-01-08 LAB — CBC
HEMATOCRIT: 37.4 % — AB (ref 39.0–52.0)
Hemoglobin: 12.1 g/dL — ABNORMAL LOW (ref 13.0–17.0)
MCH: 26.7 pg (ref 26.0–34.0)
MCHC: 32.4 g/dL (ref 30.0–36.0)
MCV: 82.6 fL (ref 78.0–100.0)
PLATELETS: 227 10*3/uL (ref 150–400)
RBC: 4.53 MIL/uL (ref 4.22–5.81)
RDW: 15.3 % (ref 11.5–15.5)
WBC: 6 10*3/uL (ref 4.0–10.5)

## 2016-01-08 NOTE — ED Triage Notes (Signed)
Pt. reports chronic central chest pain for 2 months with SOB , emesis and diaphoresis , pain radiating to upper and lower back .

## 2016-01-08 NOTE — ED Provider Notes (Addendum)
[facility]PT Provider Note   CSN: [id number] Arrival date & time: 01/08/16  2004  First Provider Contact:   First MD Initiated Contact with Patient 01/08/16 2356     By signing my name below, I, Evelene Croon, attest that this documentation has been prepared under the direction and in the presence of Everlene Balls, MD . Electronically Signed: Evelene Croon, Scribe. 01/08/2016. 12:12 AM.  History   Chief Complaint Chief Complaint  Patient presents with  . Chest Pain   The history is provided by the patient. No language interpreter was used.    HPI Comments:  Rida Steeno. is a 75 y.o. male with a history of prostate CA, COPD, GERD and HIV, who presents to the Emergency Department complaining of 10/10 central CP that radiates into his back. Pt states he has been experiencing the same pain for over 6 months. He notes he was diagnosed with PNA 1 month ago. He reports associated mild SOB, subjective fever, and productive cough. His pain is exacerbated by coughing. Pt lives by himself but has home care. No alleviating factors noted.   Past Medical History:  Diagnosis Date  . Allergic rhinitis, cause unspecified 07/21/2011  . Cervical spondylosis 07/21/2011  . CKD (chronic kidney disease) stage 3, GFR 30-59 ml/min 06/28/2015  . COPD (chronic obstructive pulmonary disease) ([place]) 07/21/2011  . GERD (gastroesophageal reflux disease)   . H/O blood clots   . 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 Hepatitis    Hep C  . Heroin withdrawal ([place]) 07/21/2011  . HIV infection ([place])   . Hx of radiation therapy 12/17/11 - 02/13/12   prostate  . Hypertension   . Lumbar spondylosis 07/21/2011  . Osteoarthritis 07/21/2011  . Pneumonia   . Prostate cancer ([place]) 09/19/11   gleason 7  . Prostate cancer metastatic to bone ([place]) 08/30/2015  . Rectal bleeding   . S/P radiation therapy 12/17/2011 through 02/13/2012   78 gray was delivered to the prostate in 40 fractions of 1.95 gray  . Septic joint of left  shoulder region ([place]) 06/15/2015  . Substance abuse   . Wears dentures    top  . Wears glasses     Patient Active Problem List   Diagnosis Date Noted  . Atypical chest pain 11/28/2015  . Generalized pain   . Palliative care encounter   . CAP (community acquired pneumonia) 11/12/2015  . BRBPR (bright red blood per rectum) 11/12/2015  . Prostate cancer metastatic to bone ([place]) 08/30/2015  . CKD (chronic kidney disease) stage 3, GFR 30-59 ml/min 06/28/2015  . Pseudogout   . Septic joint of left shoulder region ([place]) 06/15/2015  . Arthritis, shoulder region 06/14/2015  . Left shoulder pain 06/14/2015  . Peripheral edema 11/09/2014  . Constipation 11/09/2014  . Periprosthetic fracture around internal prosthetic left hip joint ([place]) 10/04/2014  . Cough 04/19/2014  . Crossover toe 03/31/2014  . Encounter for long-term (current) use of medications 03/30/2014  . Screening examination for venereal disease 03/30/2014  . Cocked-up toe of right foot 10/14/2013  . Arthritis 06/17/2013  . 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 Hematochezia 10/14/2012  . Hx of radiation therapy   . Stage T2a adenocarcinoma of the prostate with a Gleason's score of 3+4 and a PSA of 5.61 10/14/2011  . 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 Chronic pain 07/26/2011  . Preventative health care 07/21/2011  . 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 Allergic rhinitis, cause unspecified 07/21/2011  . Cervical spondylosis 07/21/2011  .  Lumbar spondylosis 07/21/2011  . Osteoarthritis 07/21/2011  . COPD (chronic obstructive pulmonary disease) ([place]) 07/21/2011  . Substance abuse 03/22/2011  . ROTATOR CUFF SYNDROME, RIGHT 07/09/2010  . 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 DECREASED HEARING, BILATERAL 09/08/2008  . Human immunodeficiency virus (HIV) disease ([place]) 10/30/2006  . Chronic hepatitis C without hepatic coma ([place]) 10/30/2006  . Essential hypertension 10/30/2006  . LOW BACK PAIN SYNDROME 10/30/2006  . Hepatitis  B 10/30/2006    Past Surgical History:  Procedure Laterality Date  . AMPUTATION Right 03/31/2014   Procedure: RIGHT SECOND TOE AMPUTATION ;  Surgeon: Wylene Simmer, MD;  Location: [facility];  Service: Orthopedics;  Laterality: Right;  . COLONOSCOPY N/A 11/17/2012   Procedure: COLONOSCOPY;  Surgeon: Ladene Artist, MD;  [facility];  Service: Endoscopy;  Laterality: N/A;  possible APC  . FOOT SURGERY     bilat. ingrown toenails removed  . HAND SURGERY     right thumb  . HERNIA REPAIR    . HIP SURGERY     total bilateral replacement  . SHOULDER ARTHROSCOPY Left 06/15/2015   Procedure: ARTHROSCOPIC SHOULDER IRRIGATION AND DEBRIDEMENT;  Surgeon: Marchia Bond, MD;  Location: [facility];  Service: Orthopedics;  Laterality: Left;  . TOTAL HIP REVISION Left 10/08/2014   Procedure: POSTERIOR LEFT TOTAL HIP REVISION;  Surgeon: Paralee Cancel, MD;  Location[facility];  Service: Orthopedics;  Laterality: Left;      Home Medications    Prior to Admission medications   Medication Sig Start Date End Date Taking? Authorizing Provider  acetaminophen (TYLENOL) 325 MG tablet Take 2 tablets (650 mg total) by mouth every 6 (six) hours as needed for mild pain (or Fever >/= 101). 10/13/14   Robbie Lis, MD  bisacodyl (DULCOLAX) 5 MG EC tablet Take 1 tablet (5 mg total) by mouth daily as needed for moderate constipation. 10/13/14   Robbie Lis, MD  cholecalciferol (VITAMIN D) 1000 UNITS tablet Take 1,000 Units by mouth daily.     Historical Provider, MD  Cyanocobalamin (VITAMIN B-12 PO) Take 1 tablet by mouth daily.    Historical Provider, MD  DESCOVY 200-25 MG tablet TAKE 1 TABLET BY MOUTH AT BEDTIME. 12/13/15   Thayer Headings, MD  enalapril (VASOTEC) 20 MG tablet Take 1 tablet (20 mg total) by mouth daily. 11/06/15   Biagio Borg, MD  feeding supplement, ENSURE COMPLETE, (ENSURE COMPLETE) LIQD Take 237 mLs by mouth 2 (two) times daily between meals. 04/12/15   Biagio Borg, MD  fentaNYL  (DURAGESIC - DOSED MCG/HR) 25 MCG/HR patch Place 1 patch (25 mcg total) onto the skin every 3 (three) days. 11/15/15   Kelvin Cellar, MD  furosemide (LASIX) 20 MG tablet Take 20 mg by mouth daily. 11/08/15   Historical Provider, MD  HYDROcodone-acetaminophen [facility]) 10-325 MG tablet Reported on 12/22/2015 11/01/15   Historical Provider, MD  HYDROmorphone (DILAUDID) 2 MG tablet Take 1 tablet (2 mg total) by mouth every 6 (six) hours as needed for severe pain. Patient not taking: Reported on 12/22/2015 11/28/15   Charlesetta Shanks, MD  oxyCODONE (OXY IR/ROXICODONE) 5 MG immediate release tablet Take 1 tablet (5 mg total) by mouth every 6 (six) hours as needed for moderate pain. Patient not taking: Reported on 12/22/2015 11/15/15   Kelvin Cellar, MD  potassium chloride (K-DUR) 10 MEQ tablet Take 1 tablet (10 mEq total) by mouth daily. 12/07/15   Biagio Borg, MD  tamsulosin (FLOMAX) 0.4 MG CAPS capsule Take  1 capsule (0.4 mg total) by mouth 2 (two) times daily. 12/07/15   Biagio Borg, MD  TIVICAY 50 MG tablet TAKE 1 TABLET (50 MG TOTAL) BY MOUTH DAILY. 12/13/15   Thayer Headings, MD  traMADol Veat[facility]bon) 50 MG tablet take 1 tablet by mouth every 6 hours if needed 01/04/16   Biagio Borg, MD  vitamin A 10000 UNIT capsule Take 10,000 Units by mouth every evening.    Historical Provider, MD  vitamin C (ASCORBIC ACID) 500 MG tablet Take 500 mg by mouth every evening.    Historical Provider, MD    Family History Family History  Problem Relation Age of Onset  . Cancer Mother     unsure of type  . Alcohol abuse Other   . Arthritis Other   . Hypertension Other   . Colon cancer Neg Hx   . Diabetes Neg Hx     Social History Social History  Substance Use Topics  . Smoking status: Former Smoker    Packs/day: 1.00    Years: 10.00    Types: Cigarettes    Quit date: 10/13/1960  . Smokeless tobacco: Never Used  . Alcohol use No     Allergies   Review of patient's allergies indicates no known  allergies.   Review of Systems Review of Systems 10 systems reviewed and all are negative for acute change except as noted in the HPI.  Physical Exam Updated Vital Signs BP 126/76 (BP Location: Left Arm)   Pulse 84   Temp 98.4 F (36.9 C) (Oral)   Resp 16   SpO2 96%   Physical Exam  Constitutional: He is oriented to person, place, and time. Vital signs are normal. He appears well-developed and well-nourished.  Non-toxic appearance. He does not appear ill. No distress.  HENT:  Head: Normocephalic and atraumatic.  Nose: Nose normal.  Mouth/Throat: Oropharynx is clear and moist. No oropharyngeal exudate.  Eyes: Conjunctivae and EOM are normal. Pupils are equal, round, and reactive to light. No scleral icterus.  Neck: Normal range of motion. Neck supple. No tracheal deviation, no edema, no erythema and normal range of motion present. No thyroid mass and no thyromegaly present.  Cardiovascular: Normal rate, regular rhythm, S1 normal, S2 normal, normal heart sounds, intact distal pulses and normal pulses.  Exam reveals no gallop and no friction rub.   No murmur heard. Pulmonary/Chest: Effort normal and breath sounds normal. No respiratory distress. He has no wheezes. He has no rhonchi. He has no rales.  Abdominal: Soft. Normal appearance and bowel sounds are normal. He exhibits no distension, no ascites and no mass. There is no hepatosplenomegaly. There is no tenderness. There is no rebound, no guarding and no CVA tenderness.  Musculoskeletal: Normal range of motion. He exhibits no edema or tenderness.  Lymphadenopathy:    He has no cervical adenopathy.  Neurological: He is alert and oriented to person, place, and time. He has normal strength. No cranial nerve deficit or sensory deficit.  Skin: Skin is warm, dry and intact. No petechiae and no rash noted. He is not diaphoretic. No erythema. No pallor.  Nursing note and vitals reviewed.    ED Treatments / Results  DIAGNOSTIC  STUDIES:  Oxygen Saturation is 96% on RA, normal by my interpretation.    COORDINATION OF CARE:  11:49 PM Discussed treatment plan with pt at bedside and pt agreed to plan.  Labs (all labs ordered are listed, but only abnormal results are displayed) Labs Reviewed  BASIC METABOLIC PANEL - Abnormal; Notable for the following:       Result Value   CO2 17 (*)    Glucose, Bld 107 (*)    All other components within normal limits  CBC - Abnormal; Notable for the following:    Hemoglobin 12.1 (*)    HCT 37.4 (*)    All other components within normal limits  I-STAT TROPOININ, ED    EKG  EKG Interpretation  Date/Time:  Monday January 08 2016 20:51:16 EDT Ventricular Rate:  81 PR Interval:  142 QRS Duration: 80 QT Interval:  350 QTC Calculation: 406 R Axis:   71 Text Interpretation:  Normal sinus rhythm Septal infarct , age undetermined ST & T wave abnormality, consider inferior ischemia Abnormal ECG No significant change since last tracing Confirmed by Glynn Octave [phone]) on 01/08/2016 11:48:54 PM       Radiology Dg Chest 2 View  Result Date: 01/08/2016 CLINICAL DATA:  Stabbing bilateral chest pain today. Shortness of breath 2 months. EXAM: CHEST  2 VIEW COMPARISON:  11/28/2015 FINDINGS: The cardiac silhouette, mediastinal and hilar contours are within normal limits and stable. There is tortuosity and calcification of the thoracic aorta. Underlying emphysematous changes and pulmonary scarring. Right lower lobe process appears to be a combination of atelectasis, infiltrate and small effusion. The bony thorax is intact. IMPRESSION: Right lower lobe process likely a combination of infiltrate, atelectasis and effusion. Electronically Signed   By: Marijo Sanes M.D.   On: 01/08/2016 21:02    Procedures Procedures   Medications Ordered in ED Medications - No data to display   Initial Impression / Assessment and Plan / ED Course  I have reviewed the triage vital signs and the  nursing notes.  Pertinent labs & imaging results that were available during my care of the patient were reviewed by me and considered in my medical decision making (see chart for details).  Clinical Course    Patient presents to the ED for chest pain x 44months.  He denies any new or acute changes to his symptoms.  His CXR does reveal a right side pneumonia.  Will treat with azithromycin and ceftriaxone.  He was given oxycodone for pain as he states this normally works for him.  He continues to be in severe pain and can not walk.  Patient will require admission for further care.  1:48 AM I spoke with Dr. Alcario Drought who does not believe the patient requires admission.  I asked him to evaluate the patient in consultation.  He states the patient will be seen by the day team after 7am.   Dr. Alcario Drought has evaluated the patient now at 3am.  He feels the primary issue is chronic pain and he can follow up with his PCP.  lan to DC with 10tabs of oxycocodone to take as needed.  Final Clinical Impressions(s) / ED Diagnoses   Final diagnoses:  None    New Prescriptions New Prescriptions   No medications on file       I personally performed the services described in this documentation, which was scribed in my presence. The recorded information has been reviewed and is accurate.       Everlene Balls, MD 01/09/16 [id number]    Everlene Balls, MD 01/09/16 1650

## 2016-01-09 DIAGNOSIS — C61 Malignant neoplasm of prostate: Secondary | ICD-10-CM | POA: Diagnosis not present

## 2016-01-09 DIAGNOSIS — J189 Pneumonia, unspecified organism: Secondary | ICD-10-CM

## 2016-01-09 DIAGNOSIS — G8929 Other chronic pain: Secondary | ICD-10-CM | POA: Diagnosis not present

## 2016-01-09 DIAGNOSIS — C7951 Secondary malignant neoplasm of bone: Secondary | ICD-10-CM | POA: Diagnosis not present

## 2016-01-09 MED ORDER — AZITHROMYCIN 250 MG PO TABS
500.0000 mg | ORAL_TABLET | Freq: Once | ORAL | Status: AC
  Administered 2016-01-09: 500 mg via ORAL
  Filled 2016-01-09: qty 2

## 2016-01-09 MED ORDER — OXYCODONE HCL 5 MG PO TABS
5.0000 mg | ORAL_TABLET | Freq: Once | ORAL | Status: AC
  Administered 2016-01-09: 5 mg via ORAL
  Filled 2016-01-09: qty 1

## 2016-01-09 MED ORDER — ONDANSETRON 4 MG PO TBDP
4.0000 mg | ORAL_TABLET | Freq: Once | ORAL | Status: AC
  Administered 2016-01-09: 4 mg via ORAL
  Filled 2016-01-09: qty 1

## 2016-01-09 MED ORDER — OXYCODONE HCL 5 MG PO TABS: 5.0000 mg | ORAL_TABLET | Freq: Two times a day (BID) | ORAL | 0 refills | Status: DC | PRN

## 2016-01-09 MED ORDER — AZITHROMYCIN 250 MG PO TABS: 250.0000 mg | ORAL_TABLET | Freq: Every day | ORAL | 0 refills | Status: DC

## 2016-01-09 MED ORDER — HYDROMORPHONE HCL 1 MG/ML IJ SOLN
1.0000 mg | Freq: Once | INTRAMUSCULAR | Status: AC
  Administered 2016-01-09: 1 mg via INTRAVENOUS
  Filled 2016-01-09: qty 1

## 2016-01-09 NOTE — Consult Note (Addendum)
Medical Consultation   Billy Lopez.  [id number]  DOB: 12-13-1940  DOA: 01/08/2016  PCP: [name], MD   Outpatient Specialists: Dr. Alen Blew   Requesting physician: Dr. Claudine Mouton  Reason for consultation: Chronic pain   History of Present Illness: Billy Claude. is an 75 y.o. male with h/o substance abuse in past, chronic pain, HIV, and now prostate cancer which has metastasized to bone.  He presents to the ED with c/o 10/10 pain, primarily in his back.  This has been ongoing and severe over the past 6 months.  He felt much better when he was admitted for narcotics for a few days back in June, but other than that pain has been severe.  Pain has been especially worse the past couple of days since he ran out of his fentanyl patch and norco last month.  He now only has ultram that he has been trying to take for the pain, although this hasnt really been working.  Work up in the ED shows incidental PNA on CXR, patient satting 98% on room air.  EDP has consulted [place] due to patients uncontrolled / chronic pain.   Review of Systems:  ROS As per HPI otherwise 10 point review of systems negative.     Past Medical History: Past Medical History:  Diagnosis Date  . Allergic rhinitis, cause unspecified 07/21/2011  . Cervical spondylosis 07/21/2011  . CKD (chronic kidney disease) stage 3, GFR 30-59 ml/min 06/28/2015  . COPD (chronic obstructive pulmonary disease) ([place]) 07/21/2011  . GERD (gastroesophageal reflux disease)   . H/O blood clots   . 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 Hepatitis    Hep C  . Heroin withdrawal ([place]) 07/21/2011  . HIV infection ([place])   . Hx of radiation therapy 12/17/11 - 02/13/12   prostate  . Hypertension   . Lumbar spondylosis 07/21/2011  . Osteoarthritis 07/21/2011  . Pneumonia   . Prostate cancer ([place]) 09/19/11   gleason 7  . Prostate cancer metastatic to bone ([place]) 08/30/2015  . Rectal bleeding   . S/P radiation therapy 12/17/2011 through  02/13/2012   78 gray was delivered to the prostate in 40 fractions of 1.95 gray  . Septic joint of left shoulder region ([place]) 06/15/2015  . Substance abuse   . Wears dentures    top  . Wears glasses     Past Surgical History: Past Surgical History:  Procedure Laterality Date  . AMPUTATION Right 03/31/2014   Procedure: RIGHT SECOND TOE AMPUTATION ;  Surgeon: Wylene Simmer, MD;  Location: [place] [facility];  Service: Orthopedics;  Laterality: Right;  . COLONOSCOPY N/A 11/17/2012   Procedure: COLONOSCOPY;  Surgeon: Ladene Artist, MD;  [facility];  Service: Endoscopy;  Laterality: N/A;  possible APC  . FOOT SURGERY     bilat. ingrown toenails removed  . HAND SURGERY     right thumb  . HERNIA REPAIR    . HIP SURGERY     total bilateral replacement  . SHOULDER ARTHROSCOPY Left 06/15/2015   Procedure: ARTHROSCOPIC SHOULDER IRRIGATION AND DEBRIDEMENT;  Surgeon: Marchia Bond, MD;  Location[place];  Service: Orthopedics;  Laterality: Left;  . TOTAL HIP REVISION Left 10/08/2014   Procedure: POSTERIOR LEFT TOTAL HIP REVISION;  Surgeon: Paralee Cancel, MD;  [facility];  Service: Orthopedics;  Laterality: Left;     Allergies:  No Known Allergies  Social History:  reports that he quit smoking about 55 years ago. His smoking use included Cigarettes. He has a 10.00 pack-year smoking history. He has never used smokeless tobacco. He reports that he does not drink alcohol or use drugs.   Family History: Family History  Problem Relation Age of Onset  . Cancer Mother     unsure of type  . Alcohol abuse Other   . Arthritis Other   . Hypertension Other   . Colon cancer Neg Hx   . Diabetes Neg Hx        Physical Exam: Vitals:   01/09/16 0000 01/09/16 0015 01/09/16 0045 01/09/16 0115  BP: 114/80 101/77 118/75 111/85  Pulse: 77 72 74 77  Resp: 18 13 15 16   Temp:      TempSrc:      SpO2: 99% 93% 99% 94%    Constitutional: ,  Alert and awake, oriented x3, not  in any acute distress. Eyes: PERLA, EOMI, irises appear normal, anicteric sclera,  ENMT: external ears and nose appear normal,            Lips appears normal, oropharynx mucosa, tongue, posterior pharynx appear normal  Neck: neck appears normal, no masses, normal ROM, no thyromegaly, no JVD  CVS: S1-S2 clear, no murmur rubs or gallops, no LE edema, normal pedal pulses  Respiratory:  clear to auscultation bilaterally, no wheezing, rales or rhonchi. Respiratory effort normal. No accessory muscle use.  Abdomen: soft nontender, nondistended, normal bowel sounds, no hepatosplenomegaly, no hernias  Musculoskeletal: : no cyanosis, clubbing or edema noted bilaterally Neuro: Cranial nerves II-XII intact, strength, sensation, reflexes Psych: judgement and insight appear normal, stable mood and affect, mental status Skin: no rashes or lesions or ulcers, no induration or nodules   Data reviewed:  I have personally reviewed following labs and imaging studies Labs:  CBC:  Recent Labs Lab 01/08/16 2023  WBC 6.0  HGB 12.1*  HCT 37.4*  MCV 82.6  PLT [id number]    Basic Metabolic Panel:  Recent Labs Lab 01/08/16 2023  NA 135  K 3.5  CL 110  CO2 17*  GLUCOSE 107*  BUN 14  CREATININE 1.09  CALCIUM 9.4   GFR CrCl cannot be calculated (Unknown ideal weight.). Liver Function Tests: No results for input(s): AST, ALT, ALKPHOS, BILITOT, PROT, ALBUMIN in the last 168 hours. No results for input(s): LIPASE, AMYLASE in the last 168 hours. No results for input(s): AMMONIA in the last 168 hours. Coagulation profile No results for input(s): INR, PROTIME in the last 168 hours.  Cardiac Enzymes: No results for input(s): CKTOTAL, CKMB, CKMBINDEX, TROPONINI in the last 168 hours. BNP: Invalid input(s): POCBNP CBG: No results for input(s): GLUCAP in the last 168 hours. D-Dimer No results for input(s): DDIMER in the last 72 hours. Hgb A1c No results for input(s): HGBA1C in the last 72 hours. Lipid  Profile No results for input(s): CHOL, HDL, LDLCALC, TRIG, CHOLHDL, LDLDIRECT in the last 72 hours. Thyroid function studies No results for input(s): TSH, T4TOTAL, T3FREE, THYROIDAB in the last 72 hours.  Invalid input(s): FREET3 Anemia work up No results for input(s): VITAMINB12, FOLATE, FERRITIN, TIBC, IRON, RETICCTPCT in the last 72 hours. Urinalysis    Component Value Date/Time   COLORURINE YELLOW 06/14/2015 1113   APPEARANCEUR CLEAR 06/14/2015 1113   LABSPEC 1.007 06/14/2015 1113   PHURINE 5.5 06/14/2015 1113   GLUCOSEU NEGATIVE 06/14/2015 1113   GLUCOSEU NEGATIVE 02/22/2015 1118   HGBUR NEGATIVE 06/14/2015 1113   BILIRUBINUR  NEGATIVE 06/14/2015 1113   [place] 06/14/2015 1113   PROTEINUR NEGATIVE 06/14/2015 1113   UROBILINOGEN 0.2 02/22/2015 1118   NITRITE NEGATIVE 06/14/2015 [place] 06/14/2015 1113     Microbiology No results found for this or any previous visit (from the past 240 hour(s)).     Inpatient Medications:   Scheduled Meds: Continuous Infusions:   Radiological Exams on Admission: Dg Chest 2 View  Result Date: 01/08/2016 CLINICAL DATA:  Stabbing bilateral chest pain today. Shortness of breath 2 months. EXAM: CHEST  2 VIEW COMPARISON:  11/28/2015 FINDINGS: The cardiac silhouette, mediastinal and hilar contours are within normal limits and stable. There is tortuosity and calcification of the thoracic aorta. Underlying emphysematous changes and pulmonary scarring. Right lower lobe process appears to be a combination of atelectasis, infiltrate and small effusion. The bony thorax is intact. IMPRESSION: Right lower lobe process likely a combination of infiltrate, atelectasis and effusion. Electronically Signed   By: Marijo Sanes M.D.   On: 01/08/2016 21:02    Impression/Recommendations Principal Problem:   Chronic pain Active Problems:   Prostate cancer metastatic to bone (HCC)   CAP (community acquired pneumonia)   Chronic  pain: now due to metastatic cancer to bone -  This patient now has incurable cancer with mets to bone that is causing him pain and likely will continue to cause him pain for the remainder of his life.  Patient needs refills of fentanyl patch and breakthrough med (either norco or percocet).  He probably (as he says) may well need to be on a higher dose of these meds long term (given that he is a former heroin addict).  Unfortunately the inpatient internal medicine service isnt able to provide management of chronic pain meds.  Our narcotic prescription policy also strictly prohibits [place] from writing any refills on these medications (even if we agree that patient needs them).  I explained to the patient that this is because he needs routine and regular follow-up when on these high risk medications which are essentially "heroin in pill and patch form".  This is due to the known risk of OD and death with these medications.  Unfortunately dont have much to offer patient from our end at this point.  He really needs to see someone (pain management or pal care perhaps?) who has the setup to safely prescribe these medications to him long term.  CAP - agree with outpatient treatment at this point.  Thank you for this consultation.   Time Spent: 60 min  Nan Maya M. D.O. [facility] 01/09/2016, 3:01 AM

## 2016-01-09 NOTE — ED Notes (Signed)
Pt became very upset when told he was being discharged.  Stated "Im not moving".

## 2016-01-09 NOTE — ED Notes (Signed)
MD at bedside. 

## 2016-01-11 MED FILL — TIVICAY 50 MG TABLET: 50 | 30 days supply | Qty: 30 | Fill #1

## 2016-01-11 MED FILL — DESCOVY 200-25 MG TABS: 200-25 | 30 days supply | Qty: 30 | Fill #1

## 2016-01-16 ENCOUNTER — Emergency Department (HOSPITAL_COMMUNITY): Payer: Commercial Managed Care - HMO

## 2016-01-16 ENCOUNTER — Ambulatory Visit (INDEPENDENT_AMBULATORY_CARE_PROVIDER_SITE_OTHER): Payer: Commercial Managed Care - HMO | Admitting: Internal Medicine

## 2016-01-16 ENCOUNTER — Encounter (HOSPITAL_COMMUNITY): Payer: Self-pay

## 2016-01-16 ENCOUNTER — Encounter: Payer: Self-pay | Admitting: Internal Medicine

## 2016-01-16 ENCOUNTER — Inpatient Hospital Stay (HOSPITAL_COMMUNITY)
Admission: EM | Admit: 2016-01-16 | Discharge: 2016-01-22 | DRG: 190 | Disposition: A | Discharge status: Skilled Nursing Facility | Payer: Commercial Managed Care - HMO | Attending: Family Medicine | Admitting: Family Medicine

## 2016-01-16 VITALS — BP 140/80 | HR 82 | Temp 98.7°F | Resp 20 | Wt 168.0 lb

## 2016-01-16 DIAGNOSIS — R109 Unspecified abdominal pain: Secondary | ICD-10-CM | POA: Diagnosis present

## 2016-01-16 DIAGNOSIS — J440 Chronic obstructive pulmonary disease with acute lower respiratory infection: Secondary | ICD-10-CM | POA: Diagnosis not present

## 2016-01-16 DIAGNOSIS — R0602 Shortness of breath: Secondary | ICD-10-CM

## 2016-01-16 DIAGNOSIS — Z8701 Personal history of pneumonia (recurrent): Secondary | ICD-10-CM

## 2016-01-16 DIAGNOSIS — Z885 Allergy status to narcotic agent status: Secondary | ICD-10-CM

## 2016-01-16 DIAGNOSIS — Y95 Nosocomial condition: Secondary | ICD-10-CM | POA: Diagnosis present

## 2016-01-16 DIAGNOSIS — N183 Chronic kidney disease, stage 3 (moderate): Secondary | ICD-10-CM | POA: Diagnosis present

## 2016-01-16 DIAGNOSIS — Z79891 Long term (current) use of opiate analgesic: Secondary | ICD-10-CM | POA: Diagnosis not present

## 2016-01-16 DIAGNOSIS — C7951 Secondary malignant neoplasm of bone: Secondary | ICD-10-CM | POA: Diagnosis not present

## 2016-01-16 DIAGNOSIS — Z923 Personal history of irradiation: Secondary | ICD-10-CM | POA: Diagnosis not present

## 2016-01-16 DIAGNOSIS — J449 Chronic obstructive pulmonary disease, unspecified: Secondary | ICD-10-CM | POA: Diagnosis not present

## 2016-01-16 DIAGNOSIS — J189 Pneumonia, unspecified organism: Secondary | ICD-10-CM | POA: Diagnosis not present

## 2016-01-16 DIAGNOSIS — Z89421 Acquired absence of other right toe(s): Secondary | ICD-10-CM | POA: Diagnosis not present

## 2016-01-16 DIAGNOSIS — F1910 Other psychoactive substance abuse, uncomplicated: Secondary | ICD-10-CM | POA: Diagnosis present

## 2016-01-16 DIAGNOSIS — Z8249 Family history of ischemic heart disease and other diseases of the circulatory system: Secondary | ICD-10-CM | POA: Diagnosis not present

## 2016-01-16 DIAGNOSIS — Z87891 Personal history of nicotine dependence: Secondary | ICD-10-CM

## 2016-01-16 DIAGNOSIS — M47816 Spondylosis without myelopathy or radiculopathy, lumbar region: Secondary | ICD-10-CM | POA: Diagnosis not present

## 2016-01-16 DIAGNOSIS — R079 Chest pain, unspecified: Secondary | ICD-10-CM | POA: Diagnosis not present

## 2016-01-16 DIAGNOSIS — F19129 Other psychoactive substance abuse with intoxication, unspecified: Secondary | ICD-10-CM | POA: Diagnosis not present

## 2016-01-16 DIAGNOSIS — H9192 Unspecified hearing loss, left ear: Secondary | ICD-10-CM | POA: Diagnosis present

## 2016-01-16 DIAGNOSIS — M6281 Muscle weakness (generalized): Secondary | ICD-10-CM | POA: Diagnosis not present

## 2016-01-16 DIAGNOSIS — G8929 Other chronic pain: Secondary | ICD-10-CM | POA: Diagnosis not present

## 2016-01-16 DIAGNOSIS — Z96643 Presence of artificial hip joint, bilateral: Secondary | ICD-10-CM | POA: Diagnosis present

## 2016-01-16 DIAGNOSIS — Z809 Family history of malignant neoplasm, unspecified: Secondary | ICD-10-CM | POA: Diagnosis not present

## 2016-01-16 DIAGNOSIS — W19XXXA Unspecified fall, initial encounter: Secondary | ICD-10-CM | POA: Diagnosis present

## 2016-01-16 DIAGNOSIS — C61 Malignant neoplasm of prostate: Secondary | ICD-10-CM | POA: Diagnosis present

## 2016-01-16 DIAGNOSIS — B20 Human immunodeficiency virus [HIV] disease: Secondary | ICD-10-CM | POA: Diagnosis present

## 2016-01-16 DIAGNOSIS — I10 Essential (primary) hypertension: Secondary | ICD-10-CM | POA: Diagnosis not present

## 2016-01-16 DIAGNOSIS — Z79899 Other long term (current) drug therapy: Secondary | ICD-10-CM

## 2016-01-16 DIAGNOSIS — B1920 Unspecified viral hepatitis C without hepatic coma: Secondary | ICD-10-CM | POA: Diagnosis present

## 2016-01-16 DIAGNOSIS — I129 Hypertensive chronic kidney disease with stage 1 through stage 4 chronic kidney disease, or unspecified chronic kidney disease: Secondary | ICD-10-CM | POA: Diagnosis not present

## 2016-01-16 DIAGNOSIS — R52 Pain, unspecified: Secondary | ICD-10-CM

## 2016-01-16 DIAGNOSIS — K219 Gastro-esophageal reflux disease without esophagitis: Secondary | ICD-10-CM | POA: Diagnosis not present

## 2016-01-16 LAB — CBC WITH DIFFERENTIAL/PLATELET
Basophils Absolute: 0.1 10*3/uL (ref 0.0–0.1)
Basophils Relative: 1 %
Eosinophils Absolute: 0 10*3/uL (ref 0.0–0.7)
Eosinophils Relative: 1 %
HEMATOCRIT: 42.9 % (ref 39.0–52.0)
HEMOGLOBIN: 14.1 g/dL (ref 13.0–17.0)
LYMPHS ABS: 1.2 10*3/uL (ref 0.7–4.0)
LYMPHS PCT: 20 %
MCH: 26.8 pg (ref 26.0–34.0)
MCHC: 32.9 g/dL (ref 30.0–36.0)
MCV: 81.4 fL (ref 78.0–100.0)
Monocytes Absolute: 0.6 10*3/uL (ref 0.1–1.0)
Monocytes Relative: 11 %
NEUTROS ABS: 4 10*3/uL (ref 1.7–7.7)
NEUTROS PCT: 67 %
Platelets: 196 10*3/uL (ref 150–400)
RBC: 5.27 MIL/uL (ref 4.22–5.81)
RDW: 15.6 % — ABNORMAL HIGH (ref 11.5–15.5)
WBC: 5.9 10*3/uL (ref 4.0–10.5)

## 2016-01-16 LAB — COMPREHENSIVE METABOLIC PANEL
ALK PHOS: 2448 U/L — AB (ref 38–126)
ALT: 15 U/L — AB (ref 17–63)
AST: 42 U/L — AB (ref 15–41)
Albumin: 4.6 g/dL (ref 3.5–5.0)
Anion gap: 8 (ref 5–15)
BUN: 15 mg/dL (ref 6–20)
CALCIUM: 9.9 mg/dL (ref 8.9–10.3)
CO2: 21 mmol/L — ABNORMAL LOW (ref 22–32)
CREATININE: 1.06 mg/dL (ref 0.61–1.24)
Chloride: 106 mmol/L (ref 101–111)
Glucose, Bld: 87 mg/dL (ref 65–99)
Potassium: 3.8 mmol/L (ref 3.5–5.1)
Sodium: 135 mmol/L (ref 135–145)
Total Bilirubin: 0.9 mg/dL (ref 0.3–1.2)
Total Protein: 8.4 g/dL — ABNORMAL HIGH (ref 6.5–8.1)

## 2016-01-16 LAB — URINALYSIS, ROUTINE W REFLEX MICROSCOPIC
GLUCOSE, UA: NEGATIVE mg/dL
HGB URINE DIPSTICK: NEGATIVE
Ketones, ur: 15 mg/dL — AB
Leukocytes, UA: NEGATIVE
Nitrite: NEGATIVE
PROTEIN: NEGATIVE mg/dL
SPECIFIC GRAVITY, URINE: 1.023 (ref 1.005–1.030)
pH: 6 (ref 5.0–8.0)

## 2016-01-16 LAB — LIPASE, BLOOD: LIPASE: 26 U/L (ref 11–51)

## 2016-01-16 LAB — I-STAT TROPONIN, ED: Troponin i, poc: 0.01 ng/mL (ref 0.00–0.08)

## 2016-01-16 MED ORDER — PIPERACILLIN-TAZOBACTAM 3.375 G IVPB
3.3750 g | Freq: Once | INTRAVENOUS | Status: AC
  Administered 2016-01-16: 3.375 g via INTRAVENOUS
  Filled 2016-01-16: qty 50

## 2016-01-16 MED ORDER — HYDROMORPHONE HCL 1 MG/ML IJ SOLN
1.0000 mg | Freq: Once | INTRAMUSCULAR | Status: AC
Start: 2016-01-16 — End: 2016-01-16
  Administered 2016-01-16: 1 mg via INTRAVENOUS
  Filled 2016-01-16: qty 1

## 2016-01-16 NOTE — ED Notes (Signed)
Bed: WTR9 Expected date:  Expected time:  Means of arrival:  Comments: 

## 2016-01-16 NOTE — ED Triage Notes (Signed)
Pt presents with c/o chest pain that he reports has been going on for the past couple of weeks. Pt reports this pain radiates to his back and is burning in nature. Pt reports hx of pneumonia. 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

## 2016-01-16 NOTE — Progress Notes (Signed)
Pre visit review using our clinic review tool, if applicable. No additional management support is needed unless otherwise documented below in the visit note. 

## 2016-01-16 NOTE — ED Notes (Signed)
Patient transported to X-ray 

## 2016-01-16 NOTE — Patient Instructions (Signed)
Ok to go to ER as you have planned  Please continue all other medications as before, and refills have been done if requested.  Please have the pharmacy call with any other refills you may need.  Please keep your appointments with your specialists as you may have planned

## 2016-01-16 NOTE — Progress Notes (Signed)
Subjective:    Patient ID: Billy Lopez., male    DOB: [birth year], 75 y.o.   MRN: [id number]  HPI  Here to f/u in motorized wheelchair, normally uses walker at home, lives alone, normally is able to do ADL, but here today after seen with chronic pain at ED aug 7, and now with back and leg pain, tx with oxycodone but does not use due to loopiness with taking. Since seen in ED states has worsening back pain and cant raise arms, cant take showers b/c cannot transfer from wheelchair to shower chair; cant cook so has had help with neighbor and home care.  Have [facility] (cant recall  Name fo rnow) but is aide only per pt, no nurse, PT or OT, or social services.  Today cannot stand well due to pain; states several times "I just cant take it anymore."  No recent falls or trauma. Denies urinary symptoms such as dysuria, frequency, urgency, flank pain, hematuria or n/v, fever, chills.  Denies worsening reflux, abd pain, dysphagia, n/v, bowel change or blood.  Pt denies fever, wt loss, night sweats, loss of appetite, or other constitutional symptoms Wt Readings from Last 3 Encounters:  01/16/16 168 lb (76.2 kg)  12/22/15 152 lb 9.6 oz (69.2 kg)  11/28/15 164 lb (74.4 kg)   Past Medical History:  Diagnosis Date  . Allergic rhinitis, cause unspecified 07/21/2011  . Cervical spondylosis 07/21/2011  . CKD (chronic kidney disease) stage 3, GFR 30-59 ml/min 06/28/2015  . COPD (chronic obstructive pulmonary disease) ([place]) 07/21/2011  . GERD (gastroesophageal reflux disease)   . H/O blood clots   . 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 Hepatitis    Hep C  . 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 HIV infection ([place])   . Hx of radiation therapy 12/17/11 - 02/13/12   prostate  . Hypertension   . Lumbar spondylosis 07/21/2011  . Osteoarthritis 07/21/2011  . Pneumonia   . Prostate cancer ([place]) 09/19/11   gleason 7  . Prostate cancer metastatic to bone ([place]) 08/30/2015  . Rectal bleeding   . S/P radiation therapy  12/17/2011 through 02/13/2012   78 gray was delivered to the prostate in 40 fractions of 1.95 gray  . Septic joint of left shoulder region ([place]) 06/15/2015  . Substance abuse   . Wears dentures    top  . Wears glasses    Past Surgical History:  Procedure Laterality Date  . AMPUTATION Right 03/31/2014   Procedure: RIGHT SECOND TOE AMPUTATION ;  Surgeon: Wylene Simmer, MD;  Location: [facility];  Service: Orthopedics;  Laterality: Right;  . COLONOSCOPY N/A 11/17/2012   Procedure: COLONOSCOPY;  Surgeon: Ladene Artist, MD;  Location[facility];  Service: Endoscopy;  Laterality: N/A;  possible APC  . FOOT SURGERY     bilat. ingrown toenails removed  . HAND SURGERY     right thumb  . HERNIA REPAIR    . HIP SURGERY     total bilateral replacement  . SHOULDER ARTHROSCOPY Left 06/15/2015   Procedure: ARTHROSCOPIC SHOULDER IRRIGATION AND DEBRIDEMENT;  Surgeon: Marchia Bond, MD;  Location: [facility];  Service: Orthopedics;  Laterality: Left;  . TOTAL HIP REVISION Left 10/08/2014   Procedure: POSTERIOR LEFT TOTAL HIP REVISION;  Surgeon: Paralee Cancel, MD;  Location: [facility];  Service: Orthopedics;  Laterality: Left;    reports that he quit smoking about 55 years ago. His smoking use included Cigarettes. He has a 10.00 pack-year smoking  history. He has never used smokeless tobacco. He reports that he does not drink alcohol or use drugs. family history includes Alcohol abuse in his other; Arthritis in his other; Cancer in his mother; Hypertension in his other. Allergies  Allergen Reactions  . Oxycodone Other (See Comments)    loopiness   Current Outpatient Prescriptions on File Prior to Visit  Medication Sig Dispense Refill  . acetaminophen (TYLENOL) 325 MG tablet Take 2 tablets (650 mg total) by mouth every 6 (six) hours as needed for mild pain (or Fever >/= 101). 30 tablet 0  . bisacodyl (DULCOLAX) 5 MG EC tablet Take 1 tablet (5 mg total) by mouth daily as needed for moderate  constipation. 30 tablet 0  . cholecalciferol (VITAMIN D) 1000 UNITS tablet Take 1,000 Units by mouth daily.     . Cyanocobalamin (VITAMIN B-12 PO) Take 1 tablet by mouth daily.    . DESCOVY 200-25 MG tablet TAKE 1 TABLET BY MOUTH AT BEDTIME. 30 tablet 6  . feeding supplement, ENSURE COMPLETE, (ENSURE COMPLETE) LIQD Take 237 mLs by mouth 2 (two) times daily between meals. 474 mL 11  . furosemide (LASIX) 20 MG tablet Take 20 mg by mouth daily.  0  . potassium chloride (K-DUR) 10 MEQ tablet Take 1 tablet (10 mEq total) by mouth daily. 90 tablet 2  . tamsulosin (FLOMAX) 0.4 MG CAPS capsule Take 1 capsule (0.4 mg total) by mouth 2 (two) times daily. 60 capsule 5  . TIVICAY 50 MG tablet TAKE 1 TABLET (50 MG TOTAL) BY MOUTH DAILY. 30 tablet 6  . traMADol (ULTRAM) 50 MG tablet take 1 tablet by mouth every 6 hours if needed (Patient taking differently: take 1 tablet by mouth every 6 hours if needed for pain.) 120 tablet 1  . vitamin A 10000 UNIT capsule Take 10,000 Units by mouth every evening.    . vitamin C (ASCORBIC ACID) 500 MG tablet Take 500 mg by mouth every evening.     No current facility-administered medications on file prior to visit.    Review of Systems  Constitutional: Negative for unusual diaphoresis or night sweats HENT: Negative for ear swelling or discharge Eyes: Negative for worsening visual haziness  Respiratory: Negative for choking and stridor.   Gastrointestinal: Negative for distension or worsening eructation Genitourinary: Negative for retention or change in urine volume.  Musculoskeletal: Negative for other MSK pain or swelling Skin: Negative for color change and worsening wound Neurological: Negative for tremors and numbness other than noted  Psychiatric/Behavioral: Negative for decreased concentration or agitation other than above       Objective:   Physical Exam BP 140/80   Pulse 82   Temp 98.7 F (37.1 C) (Oral)   Resp 20   Wt 168 lb (76.2 kg)   SpO2 95%    BMI 23.43 kg/m  VS noted,  Constitutional: Pt appears in no apparent distress HENT: Head: NCAT.  Right Ear: External ear normal.  Left Ear: External ear normal.  Eyes: . Pupils are equal, round, and reactive to light. Conjunctivae and EOM are normal Neck: Normal range of motion. Neck supple.  Cardiovascular: Normal rate and regular rhythm.   Pulmonary/Chest: Effort normal and breath sounds without rales or wheezing.  Abd:  Soft, NT, ND, + BS Neurological: Pt is alert. Not confused , motor  - moves all 4s, too nervous in pain to o/w cooperate well Skin: Skin is warm. No rash, no LE edema Psychiatric: Pt behavior is normal. No agitation. but  2+ nervous Spine with diffuse spinal and bilat paraspinal tender, and seems highly tender to even LT diffusely    Assessment & Plan:

## 2016-01-16 NOTE — Assessment & Plan Note (Signed)
With hx of chronic pain, substance abuse and anxiety, now more localized to the mid and lower back, now intractable per pt, did not take the oxycodone recently prescribed due to loopiness (and seems sincere in this), now states cannot stand well and high risk for fall, becomes highly anxious during exam, states " I dont know what you can do" and decides to go to ER abruptly, though I had offered toradol IM, and tramadol/flexeril for home and [facility] PT/OT

## 2016-01-16 NOTE — ED Provider Notes (Signed)
[facility] DEPT Provider Note   CSN: [id number] Arrival date & time: 01/16/16  1700     History   Chief Complaint Chief Complaint  Patient presents with  . Chest Pain  . Back Pain    HPI Billy Virola. is a 75 y.o. male.  HPI Billy Sacco. is a 75 y.o. male with PMH significant for HIV (last CD4 count 130, VL undetectable, on Tivicay and Descovy), CKD, GERD, HTN, prostate cancer, PNA who presents with 2 month hx of right sided, burning, tight chest pain that radiates to his back.  Associated symptoms include cough, fever "up to 105", mild intermittent SOB, generalized weakness, and one episode of vomiting yesterday.  Denies DOE, unilateral numbness or weakness, b/b incontinence, abdominal pain, or urinary symptoms.  Has taken Tylenol and ASA without relief.  Pain seems to be worse with palpation, movement, and coughing.  He states he fell today when transferring from chair to wheel chair with [place].  No head injury or LOC.  Per chart review, patient dxed with PNA 01/08/16, treated with Azithromycin which he states he completed.  He was discharged with oxycodone, but stopped taking it because it made him "loopy".  He was also seen by PCP today in the office and offered tramadol, flexeril for home and [facility] PT/OT, but patient left to come to ED.  Hx of chronic pain, substance abuse, and now intractable pain per patient.  Normally ambulates with walker, but has been using motorized wheel chair due to pain.  Past Medical History:  Diagnosis Date  . Allergic rhinitis, cause unspecified 07/21/2011  . Cervical spondylosis 07/21/2011  . CKD (chronic kidney disease) stage 3, GFR 30-59 ml/min 06/28/2015  . COPD (chronic obstructive pulmonary disease) ([place]) 07/21/2011  . GERD (gastroesophageal reflux disease)   . H/O blood clots   . 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 Hepatitis    Hep C  . 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 HIV infection ([place])   . Hx of radiation therapy 12/17/11 -  02/13/12   prostate  . Hypertension   . Lumbar spondylosis 07/21/2011  . Osteoarthritis 07/21/2011  . Pneumonia   . Prostate cancer ([place]) 09/19/11   gleason 7  . Prostate cancer metastatic to bone ([place]) 08/30/2015  . Rectal bleeding   . S/P radiation therapy 12/17/2011 through 02/13/2012   78 gray was delivered to the prostate in 40 fractions of 1.95 gray  . Septic joint of left shoulder region ([place]) 06/15/2015  . Substance abuse   . Wears dentures    top  . Wears glasses     Patient Active Problem List   Diagnosis Date Noted  . Atypical chest pain 11/28/2015  . Generalized pain   . Palliative care encounter   . CAP (community acquired pneumonia) 11/12/2015  . BRBPR (bright red blood per rectum) 11/12/2015  . Prostate cancer metastatic to bone ([place]) 08/30/2015  . CKD (chronic kidney disease) stage 3, GFR 30-59 ml/min 06/28/2015  . Pseudogout   . Septic joint of left shoulder region ([place]) 06/15/2015  . Arthritis, shoulder region 06/14/2015  . Left shoulder pain 06/14/2015  . Peripheral edema 11/09/2014  . Constipation 11/09/2014  . Periprosthetic fracture around internal prosthetic left hip joint ([place]) 10/04/2014  . Cough 04/19/2014  . Crossover toe 03/31/2014  . Encounter for long-term (current) use of medications 03/30/2014  . Screening examination for venereal disease 03/30/2014  . Cocked-up toe of right foot 10/14/2013  . Arthritis 06/17/2013  .  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 Hematochezia 10/14/2012  . Hx of radiation therapy   . Stage T2a adenocarcinoma of the prostate with a Gleason's score of 3+4 and a PSA of 5.61 10/14/2011  . 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 Chronic pain 07/26/2011  . Preventative health care 07/21/2011  . 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 Allergic rhinitis, cause unspecified 07/21/2011  . Cervical spondylosis 07/21/2011  . Lumbar spondylosis 07/21/2011  . Osteoarthritis 07/21/2011  . COPD (chronic obstructive pulmonary disease) ([place])  07/21/2011  . Substance abuse 03/22/2011  . ROTATOR CUFF SYNDROME, RIGHT 07/09/2010  . 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 DECREASED HEARING, BILATERAL 09/08/2008  . Human immunodeficiency virus (HIV) disease ([place]) 10/30/2006  . Chronic hepatitis C without hepatic coma ([place]) 10/30/2006  . Essential hypertension 10/30/2006  . LOW BACK PAIN SYNDROME 10/30/2006  . Hepatitis B 10/30/2006    Past Surgical History:  Procedure Laterality Date  . AMPUTATION Right 03/31/2014   Procedure: RIGHT SECOND TOE AMPUTATION ;  Surgeon: Wylene Simmer, MD;  Location: [facility];  Service: Orthopedics;  Laterality: Right;  . COLONOSCOPY N/A 11/17/2012   Procedure: COLONOSCOPY;  Surgeon: Ladene Artist, MD;  [facility];  Service: Endoscopy;  Laterality: N/A;  possible APC  . FOOT SURGERY     bilat. ingrown toenails removed  . HAND SURGERY     right thumb  . HERNIA REPAIR    . HIP SURGERY     total bilateral replacement  . SHOULDER ARTHROSCOPY Left 06/15/2015   Procedure: ARTHROSCOPIC SHOULDER IRRIGATION AND DEBRIDEMENT;  Surgeon: Marchia Bond, MD;  Location: [facility];  Service: Orthopedics;  Laterality: Left;  . TOTAL HIP REVISION Left 10/08/2014   Procedure: POSTERIOR LEFT TOTAL HIP REVISION;  Surgeon: Paralee Cancel, MD;  [facility];  Service: Orthopedics;  Laterality: Left;       Home Medications    Prior to Admission medications   Medication Sig Start Date End Date Taking? Authorizing Provider  acetaminophen (TYLENOL) 325 MG tablet Take 2 tablets (650 mg total) by mouth every 6 (six) hours as needed for mild pain (or Fever >/= 101). 10/13/14  Yes Robbie Lis, MD  bisacodyl (DULCOLAX) 5 MG EC tablet Take 1 tablet (5 mg total) by mouth daily as needed for moderate constipation. 10/13/14  Yes Robbie Lis, MD  cholecalciferol (VITAMIN D) 1000 UNITS tablet Take 1,000 Units by mouth daily.    Yes Historical Provider, MD  Cyanocobalamin (VITAMIN B-12 PO) Take 1  tablet by mouth daily.   Yes Historical Provider, MD  DESCOVY 200-25 MG tablet TAKE 1 TABLET BY MOUTH AT BEDTIME. 12/13/15  Yes Thayer Headings, MD  enalapril (VASOTEC) 20 MG tablet Take 20 mg by mouth daily. 11/06/15  Yes Historical Provider, MD  feeding supplement, ENSURE COMPLETE, (ENSURE COMPLETE) LIQD Take 237 mLs by mouth 2 (two) times daily between meals. 04/12/15  Yes Biagio Borg, MD  furosemide (LASIX) 20 MG tablet Take 20 mg by mouth daily. 11/08/15  Yes Historical Provider, MD  HYDROmorphone (DILAUDID) 2 MG tablet take 1 tablet by mouth every 6 hours if needed for severe pain 11/29/15  Yes Historical Provider, MD  potassium chloride (K-DUR) 10 MEQ tablet Take 1 tablet (10 mEq total) by mouth daily. 12/07/15  Yes Biagio Borg, MD  tamsulosin (FLOMAX) 0.4 MG CAPS capsule Take 1 capsule (0.4 mg total) by mouth 2 (two) times daily. 12/07/15  Yes Biagio Borg, MD  TIVICAY 50 MG  tablet TAKE 1 TABLET (50 MG TOTAL) BY MOUTH DAILY. 12/13/15  Yes Thayer Headings, MD  traMADol (ULTRAM) 50 MG tablet take 1 tablet by mouth every 6 hours if needed Patient taking differently: take 1 tablet by mouth every 6 hours if needed for pain. 01/04/16  Yes Biagio Borg, MD  vitamin A 10000 UNIT capsule Take 10,000 Units by mouth every evening.   Yes Historical Provider, MD  vitamin C (ASCORBIC ACID) 500 MG tablet Take 500 mg by mouth every evening.   Yes Historical Provider, MD    Family History Family History  Problem Relation Age of Onset  . Cancer Mother     unsure of type  . Alcohol abuse Other   . Arthritis Other   . Hypertension Other   . Colon cancer Neg Hx   . Diabetes Neg Hx     Social History Social History  Substance Use Topics  . Smoking status: Former Smoker    Packs/day: 1.00    Years: 10.00    Types: Cigarettes    Quit date: 10/13/1960  . Smokeless tobacco: Never Used  . Alcohol use No     Allergies   Oxycodone   Review of Systems Review of Systems All other systems negative unless  otherwise stated in HPI   Physical Exam Updated Vital Signs BP 134/90   Pulse 80   Temp 98.3 F (36.8 C) (Oral)   Resp 17   Ht 5\' 8"  (1.727 m)   Wt 76.2 kg   SpO2 94%   BMI 25.54 kg/m   Physical Exam  Constitutional: He is oriented to person, place, and time. He appears well-developed and well-nourished.  Non-toxic appearance. He does not have a sickly appearance. He does not appear ill.  HENT:  Head: Normocephalic and atraumatic.  Mouth/Throat: Oropharynx is clear and moist.  Eyes: Conjunctivae are normal. Pupils are equal, round, and reactive to light.  Neck: Normal range of motion. Neck supple.  No cervical midline tenderness.   Cardiovascular: Normal rate and regular rhythm.   Pulmonary/Chest: Effort normal and breath sounds normal. No accessory muscle usage or stridor. No respiratory distress. He has no wheezes. He has no rhonchi. He has no rales. He exhibits tenderness (right anterior).  Abdominal: Soft. Bowel sounds are normal. He exhibits no distension. There is no tenderness. There is no rebound and no guarding.  Musculoskeletal: Normal range of motion.  Diffuse thoracic and lumbar midline tenderness.    Lymphadenopathy:    He has no cervical adenopathy.  Neurological: He is alert and oriented to person, place, and time.  Strength and sensation intact b/l throughout upper and lower extremities. No saddle anesthesia.   Skin: Skin is warm and dry.  Psychiatric: He has a normal mood and affect. His behavior is normal.     ED Treatments / Results  Labs (all labs ordered are listed, but only abnormal results are displayed) Labs Reviewed  CBC WITH DIFFERENTIAL/PLATELET - Abnormal; Notable for the following:       Result Value   RDW 15.6 (*)    All other components within normal limits  COMPREHENSIVE METABOLIC PANEL - Abnormal; Notable for the following:    CO2 21 (*)    Total Protein 8.4 (*)    AST 42 (*)    ALT 15 (*)    Alkaline Phosphatase 2,448 (*)    All  other components within normal limits  URINALYSIS, ROUTINE W REFLEX MICROSCOPIC (NOT AT [facility]) - Abnormal; Notable for  the following:    Bilirubin Urine SMALL (*)    Ketones, ur 15 (*)    All other components within normal limits  LIPASE, BLOOD  LACTATE DEHYDROGENASE  BRAIN NATRIURETIC PEPTIDE  TROPONIN I  I-STAT TROPOININ, ED    EKG  EKG Interpretation None       Radiology Dg Chest 2 View  Result Date: 01/16/2016 CLINICAL DATA:  Subacute onset of generalized chest pain, radiating to the back. Initial encounter. EXAM: CHEST  2 VIEW COMPARISON:  Chest radiograph performed 01/08/2016 FINDINGS: Mild bilateral linear airspace opacities may reflect pneumonia or possibly atelectasis. No pleural effusion or pneumothorax is seen. The heart is normal in size. No acute osseous abnormalities are identified. IMPRESSION: Mild bilateral linear opacities may reflect pneumonia or possibly atelectasis. Electronically Signed   By: Garald Balding M.D.   On: 01/16/2016 21:06   Dg Lumbar Spine Complete  Result Date: 01/16/2016 CLINICAL DATA:  Subacute onset of lower back pain. Initial encounter. EXAM: LUMBAR SPINE - COMPLETE 4+ VIEW COMPARISON:  CT of the abdomen and pelvis from 04/13/2015 FINDINGS: There is no evidence of fracture or subluxation. Vertebral bodies demonstrate normal height. Degenerative disc space narrowing is noted along the lower lumbar spine, with scattered lateral osteophytes and endplate sclerotic change. Associated facet disease is noted. The visualized bowel gas pattern is unremarkable in appearance; air and stool are noted within the colon. The sacroiliac joints are within normal limits. Bilateral hip arthroplasties are incompletely imaged but appear grossly unremarkable. IMPRESSION: No evidence of fracture or subluxation along the lumbar spine. Mild degenerative change along the lower lumbar spine. Electronically Signed   By: Garald Balding M.D.   On: 01/16/2016 21:22     Procedures Procedures (including critical care time)  Medications Ordered in ED Medications  piperacillin-tazobactam (ZOSYN) IVPB 3.375 g (3.375 g Intravenous New Bag/Given 01/16/16 2333)  HYDROmorphone (DILAUDID) injection 1 mg (1 mg Intravenous Given 01/16/16 2115)     Initial Impression / Assessment and Plan / ED Course  I have reviewed the triage vital signs and the nursing notes.  Pertinent labs & imaging results that were available during my care of the patient were reviewed by me and considered in my medical decision making (see chart for details).  Clinical Course   Patient presents with 2 months of right sided chest pain and thoracic back pain worse with movement and palpation.  Recently treated for CAP with one IV dose Rocephin in ED and d/c home with Azithromycin last week.  He reports continued cough and shortness of breath along with same right sided chest pain x 2 months.   EKG shows sinus rhythm, without acute changes. Troponin 1 normal. Do not suspect ACS. No leukocytosis. No metabolic derangements. His chest x-ray does show mild bilateral linear opacities, this is likely pneumonia. This is worsened from his chest x-ray last week. At this time, I feel the patient warrants admission for failed outpatient treatment antibiotics for CAP. Discussed case with pharmacy. Given patient's recent pneumonia and hospitalizations will initiate broad-spectrum coverage with Zosyn. He is hemodynamically stable. Afebrile. He appears well. He does not meet sepsis criteria. Appreciate Dr. Hal Hope.  Case has been discussed with and seen by Dr. Leonette Monarch who agrees with the above plan for admission.   Final Clinical Impressions(s) / ED Diagnoses   Final diagnoses:  CAP (community acquired pneumonia)    New Prescriptions New Prescriptions   No medications on file     Gloriann Loan, PA-C 01/17/16 0013  Fatima Blank, MD 01/17/16 [phone]

## 2016-01-17 ENCOUNTER — Observation Stay (HOSPITAL_COMMUNITY): Payer: Commercial Managed Care - HMO

## 2016-01-17 ENCOUNTER — Encounter (HOSPITAL_COMMUNITY): Payer: Self-pay | Admitting: Internal Medicine

## 2016-01-17 DIAGNOSIS — R109 Unspecified abdominal pain: Secondary | ICD-10-CM | POA: Diagnosis present

## 2016-01-17 DIAGNOSIS — H9192 Unspecified hearing loss, left ear: Secondary | ICD-10-CM | POA: Diagnosis present

## 2016-01-17 DIAGNOSIS — I129 Hypertensive chronic kidney disease with stage 1 through stage 4 chronic kidney disease, or unspecified chronic kidney disease: Secondary | ICD-10-CM | POA: Diagnosis present

## 2016-01-17 DIAGNOSIS — R079 Chest pain, unspecified: Secondary | ICD-10-CM | POA: Diagnosis not present

## 2016-01-17 DIAGNOSIS — Z87891 Personal history of nicotine dependence: Secondary | ICD-10-CM | POA: Diagnosis not present

## 2016-01-17 DIAGNOSIS — N183 Chronic kidney disease, stage 3 (moderate): Secondary | ICD-10-CM | POA: Diagnosis present

## 2016-01-17 DIAGNOSIS — J189 Pneumonia, unspecified organism: Secondary | ICD-10-CM | POA: Diagnosis present

## 2016-01-17 DIAGNOSIS — Z923 Personal history of irradiation: Secondary | ICD-10-CM | POA: Diagnosis not present

## 2016-01-17 DIAGNOSIS — Z8701 Personal history of pneumonia (recurrent): Secondary | ICD-10-CM | POA: Diagnosis not present

## 2016-01-17 DIAGNOSIS — B20 Human immunodeficiency virus [HIV] disease: Secondary | ICD-10-CM | POA: Diagnosis present

## 2016-01-17 DIAGNOSIS — C7951 Secondary malignant neoplasm of bone: Secondary | ICD-10-CM | POA: Diagnosis present

## 2016-01-17 DIAGNOSIS — W19XXXA Unspecified fall, initial encounter: Secondary | ICD-10-CM | POA: Diagnosis present

## 2016-01-17 DIAGNOSIS — R0602 Shortness of breath: Secondary | ICD-10-CM | POA: Diagnosis present

## 2016-01-17 DIAGNOSIS — Z809 Family history of malignant neoplasm, unspecified: Secondary | ICD-10-CM | POA: Diagnosis not present

## 2016-01-17 DIAGNOSIS — J440 Chronic obstructive pulmonary disease with acute lower respiratory infection: Secondary | ICD-10-CM | POA: Diagnosis present

## 2016-01-17 DIAGNOSIS — Y95 Nosocomial condition: Secondary | ICD-10-CM | POA: Diagnosis present

## 2016-01-17 DIAGNOSIS — Z96643 Presence of artificial hip joint, bilateral: Secondary | ICD-10-CM | POA: Diagnosis present

## 2016-01-17 DIAGNOSIS — C61 Malignant neoplasm of prostate: Secondary | ICD-10-CM | POA: Diagnosis present

## 2016-01-17 DIAGNOSIS — Z89421 Acquired absence of other right toe(s): Secondary | ICD-10-CM | POA: Diagnosis not present

## 2016-01-17 DIAGNOSIS — Z79891 Long term (current) use of opiate analgesic: Secondary | ICD-10-CM | POA: Diagnosis not present

## 2016-01-17 DIAGNOSIS — K219 Gastro-esophageal reflux disease without esophagitis: Secondary | ICD-10-CM | POA: Diagnosis present

## 2016-01-17 DIAGNOSIS — Z8249 Family history of ischemic heart disease and other diseases of the circulatory system: Secondary | ICD-10-CM | POA: Diagnosis not present

## 2016-01-17 DIAGNOSIS — G8929 Other chronic pain: Secondary | ICD-10-CM | POA: Diagnosis present

## 2016-01-17 DIAGNOSIS — Z885 Allergy status to narcotic agent status: Secondary | ICD-10-CM | POA: Diagnosis not present

## 2016-01-17 DIAGNOSIS — Z79899 Other long term (current) drug therapy: Secondary | ICD-10-CM | POA: Diagnosis not present

## 2016-01-17 DIAGNOSIS — B1920 Unspecified viral hepatitis C without hepatic coma: Secondary | ICD-10-CM | POA: Diagnosis present

## 2016-01-17 LAB — BASIC METABOLIC PANEL
Anion gap: 8 (ref 5–15)
BUN: 17 mg/dL (ref 6–20)
CHLORIDE: 106 mmol/L (ref 101–111)
CO2: 21 mmol/L — AB (ref 22–32)
CREATININE: 1.07 mg/dL (ref 0.61–1.24)
Calcium: 9.1 mg/dL (ref 8.9–10.3)
GFR calc Af Amer: >60 mL/min (ref >60)
GFR calc non Af Amer: >60 mL/min (ref >60)
Glucose, Bld: 110 mg/dL — ABNORMAL HIGH (ref 65–99)
Potassium: 3.6 mmol/L (ref 3.5–5.1)
SODIUM: 135 mmol/L (ref 135–145)

## 2016-01-17 LAB — CBC
HCT: 36.9 % — ABNORMAL LOW (ref 39.0–52.0)
Hemoglobin: 12.1 g/dL — ABNORMAL LOW (ref 13.0–17.0)
MCH: 26.8 pg (ref 26.0–34.0)
MCHC: 32.8 g/dL (ref 30.0–36.0)
MCV: 81.6 fL (ref 78.0–100.0)
PLATELETS: 168 10*3/uL (ref 150–400)
RBC: 4.52 MIL/uL (ref 4.22–5.81)
RDW: 15.8 % — AB (ref 11.5–15.5)
WBC: 5.2 10*3/uL (ref 4.0–10.5)

## 2016-01-17 LAB — TROPONIN I
Troponin I: 0.03 ng/mL (ref <0.03)
Troponin I: <0.03 ng/mL (ref <0.03)
Troponin I: 0.03 ng/mL (ref <0.03)

## 2016-01-17 LAB — BRAIN NATRIURETIC PEPTIDE: B NATRIURETIC PEPTIDE 5: 139.7 pg/mL — AB (ref 0.0–100.0)

## 2016-01-17 LAB — LACTATE DEHYDROGENASE: LDH: 639 U/L — AB (ref 98–192)

## 2016-01-17 MED ORDER — ENSURE ENLIVE PO LIQD
237.0000 mL | Freq: Two times a day (BID) | ORAL | Status: DC
  Administered 2016-01-17 – 2016-01-22 (×11): 237 mL via ORAL

## 2016-01-17 MED ORDER — FUROSEMIDE 20 MG PO TABS
20.0000 mg | ORAL_TABLET | Freq: Every day | ORAL | Status: DC
  Administered 2016-01-17 – 2016-01-22 (×6): 20 mg via ORAL
  Filled 2016-01-17 (×6): qty 1

## 2016-01-17 MED ORDER — VITAMIN C 500 MG PO TABS
500.0000 mg | ORAL_TABLET | Freq: Every evening | ORAL | Status: DC
  Administered 2016-01-17 – 2016-01-22 (×7): 500 mg via ORAL
  Filled 2016-01-17 (×7): qty 1

## 2016-01-17 MED ORDER — HYDROMORPHONE HCL 2 MG PO TABS
2.0000 mg | ORAL_TABLET | Freq: Four times a day (QID) | ORAL | Status: DC | PRN
  Administered 2016-01-17 – 2016-01-19 (×8): 2 mg via ORAL
  Filled 2016-01-17 (×9): qty 1

## 2016-01-17 MED ORDER — ONDANSETRON HCL 4 MG PO TABS: 4.0000 mg | ORAL_TABLET | Freq: Four times a day (QID) | ORAL | Status: DC | PRN

## 2016-01-17 MED ORDER — ONDANSETRON HCL 4 MG/2ML IJ SOLN
4.0000 mg | Freq: Four times a day (QID) | INTRAMUSCULAR | Status: DC | PRN
Start: 2016-01-17 — End: 2016-01-23

## 2016-01-17 MED ORDER — ACETAMINOPHEN 325 MG PO TABS: 650.0000 mg | ORAL_TABLET | Freq: Four times a day (QID) | ORAL | Status: DC | PRN

## 2016-01-17 MED ORDER — TRAMADOL HCL 50 MG PO TABS
50.0000 mg | ORAL_TABLET | Freq: Four times a day (QID) | ORAL | Status: DC | PRN
  Administered 2016-01-17 – 2016-01-22 (×17): 50 mg via ORAL
  Filled 2016-01-17 (×17): qty 1

## 2016-01-17 MED ORDER — ENALAPRIL MALEATE 20 MG PO TABS
20.0000 mg | ORAL_TABLET | Freq: Every day | ORAL | Status: DC
  Administered 2016-01-17 – 2016-01-22 (×6): 20 mg via ORAL
  Filled 2016-01-17 (×6): qty 1

## 2016-01-17 MED ORDER — ENOXAPARIN SODIUM 40 MG/0.4ML ~~LOC~~ SOLN
40.0000 mg | SUBCUTANEOUS | Status: DC
  Administered 2016-01-17 – 2016-01-22 (×6): 40 mg via SUBCUTANEOUS
  Filled 2016-01-17 (×7): qty 0.4

## 2016-01-17 MED ORDER — DOLUTEGRAVIR SODIUM 50 MG PO TABS
50.0000 mg | ORAL_TABLET | Freq: Every day | ORAL | Status: DC
  Administered 2016-01-17 – 2016-01-22 (×6): 50 mg via ORAL
  Filled 2016-01-17 (×6): qty 1

## 2016-01-17 MED ORDER — BISACODYL 5 MG PO TBEC
5.0000 mg | DELAYED_RELEASE_TABLET | Freq: Every day | ORAL | Status: DC | PRN
  Administered 2016-01-17 – 2016-01-20 (×2): 5 mg via ORAL
  Filled 2016-01-17 (×2): qty 1

## 2016-01-17 MED ORDER — VANCOMYCIN HCL IN DEXTROSE 1-5 GM/200ML-% IV SOLN
1000.0000 mg | Freq: Two times a day (BID) | INTRAVENOUS | Status: DC
  Administered 2016-01-17 – 2016-01-19 (×5): 1000 mg via INTRAVENOUS
  Filled 2016-01-17 (×6): qty 200

## 2016-01-17 MED ORDER — IOPAMIDOL (ISOVUE-370) INJECTION 76%
100.0000 mL | Freq: Once | INTRAVENOUS | Status: AC | PRN
  Administered 2016-01-17: 100 mL via INTRAVENOUS

## 2016-01-17 MED ORDER — ACETAMINOPHEN 650 MG RE SUPP: 650.0000 mg | Freq: Four times a day (QID) | RECTAL | Status: DC | PRN

## 2016-01-17 MED ORDER — POTASSIUM CHLORIDE ER 10 MEQ PO TBCR
10.0000 meq | EXTENDED_RELEASE_TABLET | Freq: Every day | ORAL | Status: DC
  Administered 2016-01-17 – 2016-01-22 (×6): 10 meq via ORAL
  Filled 2016-01-17 (×12): qty 1

## 2016-01-17 MED ORDER — DEXTROSE 5 % IV SOLN
1.0000 g | Freq: Three times a day (TID) | INTRAVENOUS | Status: DC
  Administered 2016-01-17 – 2016-01-19 (×7): 1 g via INTRAVENOUS
  Filled 2016-01-17 (×8): qty 1

## 2016-01-17 MED ORDER — TAMSULOSIN HCL 0.4 MG PO CAPS
0.4000 mg | ORAL_CAPSULE | Freq: Two times a day (BID) | ORAL | Status: DC
  Administered 2016-01-17 – 2016-01-22 (×12): 0.4 mg via ORAL
  Filled 2016-01-17 (×12): qty 1

## 2016-01-17 MED ORDER — EMTRICITABINE-TENOFOVIR AF 200-25 MG PO TABS
1.0000 | ORAL_TABLET | Freq: Every day | ORAL | Status: DC
  Administered 2016-01-17 – 2016-01-22 (×7): 1 via ORAL
  Filled 2016-01-17 (×7): qty 1

## 2016-01-17 MED ORDER — VITAMIN A 10000 UNITS PO CAPS
10000.0000 [IU] | ORAL_CAPSULE | Freq: Every evening | ORAL | Status: DC
  Administered 2016-01-17 – 2016-01-22 (×7): 10000 [IU] via ORAL
  Filled 2016-01-17 (×7): qty 1

## 2016-01-17 MED ORDER — HYDROMORPHONE HCL 1 MG/ML IJ SOLN
0.5000 mg | Freq: Once | INTRAMUSCULAR | Status: DC
  Administered 2016-01-17: 0.5 mg via INTRAVENOUS
  Filled 2016-01-17: qty 1

## 2016-01-17 NOTE — H&P (Addendum)
History and Physical    Billy Lopez. [id number] DOB: February 18, 1941 DOA: 01/16/2016  PCP: [name], MD  Patient coming from: Home.  Chief Complaint: Shortness of breath and chest pain.  HPI: [place] Billy Lopez. is a 75 y.o. male with metastatic prostate cancer, hypertension, HIV, COPD and drug abuse who usually ambulates with help of walker or wheelchair presents to the ER because of pleuritic type of right-sided chest pain and shortness of breath. Shortness of breath is on exertion with some cough. Has had subjective feeling of fever and chills. Had come to the ER last week and was treated for pneumonia as outpatient. Chest x-ray today shows some infiltrates in this patient is having ongoing symptoms and has been admitted for further observation. Has had one episode of vomiting. Last CD4 count was in June 2017 was 390. She has markedly elevated alkaline phosphatase and LDH probably from metastatic prostate cancer. On exam patient is not hypoxic and not in distress.   ED Course: Patient was started on empiric antibiotics for healthcare associated pneumonia.  Review of Systems: As per HPI, rest all negative.   Past Medical History:  Diagnosis Date  . Allergic rhinitis, cause unspecified 07/21/2011  . Cervical spondylosis 07/21/2011  . CKD (chronic kidney disease) stage 3, GFR 30-59 ml/min 06/28/2015  . COPD (chronic obstructive pulmonary disease) ([place]) 07/21/2011  . GERD (gastroesophageal reflux disease)   . H/O blood clots   . 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 Hepatitis    Hep C  . 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 HIV infection ([place])   . Hx of radiation therapy 12/17/11 - 02/13/12   prostate  . Hypertension   . Lumbar spondylosis 07/21/2011  . Osteoarthritis 07/21/2011  . Pneumonia   . Prostate cancer ([place]) 09/19/11   gleason 7  . Prostate cancer metastatic to bone ([place]) 08/30/2015  . Rectal bleeding   . S/P radiation therapy 12/17/2011 through 02/13/2012   78 gray was  delivered to the prostate in 40 fractions of 1.95 gray  . Septic joint of left shoulder region ([place]) 06/15/2015  . Substance abuse   . Wears dentures    top  . Wears glasses     Past Surgical History:  Procedure Laterality Date  . AMPUTATION Right 03/31/2014   Procedure: RIGHT SECOND TOE AMPUTATION ;  Surgeon: Wylene Simmer, MD;  Location: [facility];  Service: Orthopedics;  Laterality: Right;  . COLONOSCOPY N/A 11/17/2012   Procedure: COLONOSCOPY;  Surgeon: Ladene Artist, MD;  Location: [facility];  Service: Endoscopy;  Laterality: N/A;  possible APC  . FOOT SURGERY     bilat. ingrown toenails removed  . HAND SURGERY     right thumb  . HERNIA REPAIR    . HIP SURGERY     total bilateral replacement  . SHOULDER ARTHROSCOPY Left 06/15/2015   Procedure: ARTHROSCOPIC SHOULDER IRRIGATION AND DEBRIDEMENT;  Surgeon: Marchia Bond, MD;  Location: [facility];  Service: Orthopedics;  Laterality: Left;  . TOTAL HIP REVISION Left 10/08/2014   Procedure: POSTERIOR LEFT TOTAL HIP REVISION;  Surgeon: Paralee Cancel, MD;  Location: [facility];  Service: Orthopedics;  Laterality: Left;     reports that he quit smoking about 55 years ago. His smoking use included Cigarettes. He has a 10.00 pack-year smoking history. He has never used smokeless tobacco. He reports that he does not drink alcohol or use drugs.  Allergies  Allergen Reactions  . Oxycodone Other (See Comments)  loopiness    Family History  Problem Relation Age of Onset  . Cancer Mother     unsure of type  . Alcohol abuse Other   . Arthritis Other   . Hypertension Other   . Colon cancer Neg Hx   . Diabetes Neg Hx     Prior to Admission medications   Medication Sig Start Date End Date Taking? Authorizing Provider  acetaminophen (TYLENOL) 325 MG tablet Take 2 tablets (650 mg total) by mouth every 6 (six) hours as needed for mild pain (or Fever >/= 101). 10/13/14  Yes Robbie Lis, MD  bisacodyl (DULCOLAX) 5 MG EC tablet Take  1 tablet (5 mg total) by mouth daily as needed for moderate constipation. 10/13/14  Yes Robbie Lis, MD  cholecalciferol (VITAMIN D) 1000 UNITS tablet Take 1,000 Units by mouth daily.    Yes Historical Provider, MD  Cyanocobalamin (VITAMIN B-12 PO) Take 1 tablet by mouth daily.   Yes Historical Provider, MD  DESCOVY 200-25 MG tablet TAKE 1 TABLET BY MOUTH AT BEDTIME. 12/13/15  Yes Thayer Headings, MD  enalapril (VASOTEC) 20 MG tablet Take 20 mg by mouth daily. 11/06/15  Yes Historical Provider, MD  feeding supplement, ENSURE COMPLETE, (ENSURE COMPLETE) LIQD Take 237 mLs by mouth 2 (two) times daily between meals. 04/12/15  Yes Biagio Borg, MD  furosemide (LASIX) 20 MG tablet Take 20 mg by mouth daily. 11/08/15  Yes Historical Provider, MD  HYDROmorphone (DILAUDID) 2 MG tablet take 1 tablet by mouth every 6 hours if needed for severe pain 11/29/15  Yes Historical Provider, MD  potassium chloride (K-DUR) 10 MEQ tablet Take 1 tablet (10 mEq total) by mouth daily. 12/07/15  Yes Biagio Borg, MD  tamsulosin (FLOMAX) 0.4 MG CAPS capsule Take 1 capsule (0.4 mg total) by mouth 2 (two) times daily. 12/07/15  Yes Biagio Borg, MD  TIVICAY 50 MG tablet TAKE 1 TABLET (50 MG TOTAL) BY MOUTH DAILY. 12/13/15  Yes Thayer Headings, MD  traMADol (ULTRAM) 50 MG tablet take 1 tablet by mouth every 6 hours if needed Patient taking differently: take 1 tablet by mouth every 6 hours if needed for pain. 01/04/16  Yes Biagio Borg, MD  vitamin A 10000 UNIT capsule Take 10,000 Units by mouth every evening.   Yes Historical Provider, MD  vitamin C (ASCORBIC ACID) 500 MG tablet Take 500 mg by mouth every evening.   Yes Historical Provider, MD    Physical Exam: Vitals:   01/16/16 2112 01/16/16 2304 01/16/16 2330 01/17/16 0025  BP: (!) 152/107 155/95 134/90 (!) 164/96  Pulse: 72 85 80 74  Resp: 22 (!) 27 17   Temp:    97.7 F (36.5 C)  TempSrc:    Oral  SpO2: 100% 93% 94% 99%  Weight:      Height:          Constitutional: Not  in distress. Vitals:   01/16/16 2112 01/16/16 2304 01/16/16 2330 01/17/16 0025  BP: (!) 152/107 155/95 134/90 (!) 164/96  Pulse: 72 85 80 74  Resp: 22 (!) 27 17   Temp:    97.7 F (36.5 C)  TempSrc:    Oral  SpO2: 100% 93% 94% 99%  Weight:      Height:       Eyes: Anicteric no pallor. ENMT: No discharge from the ears eyes nose and mouth. Neck: No mass felt. No JVD appreciated. Respiratory: No rhonchi or crepitations. Cardiovascular: S1 and  S2 heard. Abdomen: Soft nontender bowel sounds present. No guarding or rigidity. Musculoskeletal: No edema. Skin: No rash. Neurologic: Alert awake oriented to time place and moves all extremities. Psychiatric: Appears normal.   Labs on Admission: I have personally reviewed following labs and imaging studies  CBC:  Recent Labs Lab 01/16/16 2050  WBC 5.9  NEUTROABS 4.0  HGB 14.1  HCT 42.9  MCV 81.4  PLT 123456   Basic Metabolic Panel:  Recent Labs Lab 01/16/16 2050  NA 135  K 3.8  CL 106  CO2 21*  GLUCOSE 87  BUN 15  CREATININE 1.06  CALCIUM 9.9   GFR: Estimated Creatinine Clearance: 60 mL/min (by C-G formula based on SCr of 1.06 mg/dL). Liver Function Tests:  Recent Labs Lab 01/16/16 2050  AST 42*  ALT 15*  ALKPHOS 2,448*  BILITOT 0.9  PROT 8.4*  ALBUMIN 4.6    Recent Labs Lab 01/16/16 2050  LIPASE 26   No results for input(s): AMMONIA in the last 168 hours. Coagulation Profile: No results for input(s): INR, PROTIME in the last 168 hours. Cardiac Enzymes:  Recent Labs Lab 01/16/16 2314  TROPONINI <0.03   BNP (last 3 results) No results for input(s): PROBNP in the last 8760 hours. HbA1C: No results for input(s): HGBA1C in the last 72 hours. CBG: No results for input(s): GLUCAP in the last 168 hours. Lipid Profile: No results for input(s): CHOL, HDL, LDLCALC, TRIG, CHOLHDL, LDLDIRECT in the last 72 hours. Thyroid Function Tests: No results for input(s): TSH, T4TOTAL, FREET4, T3FREE, THYROIDAB in  the last 72 hours. Anemia Panel: No results for input(s): VITAMINB12, FOLATE, FERRITIN, TIBC, IRON, RETICCTPCT in the last 72 hours. Urine analysis:    Component Value Date/Time   COLORURINE YELLOW 01/16/2016 2011   APPEARANCEUR CLEAR 01/16/2016 2011   LABSPEC 1.023 01/16/2016 2011   PHURINE 6.0 01/16/2016 2011   GLUCOSEU NEGATIVE 01/16/2016 2011   GLUCOSEU NEGATIVE 02/22/2015 1118   HGBUR NEGATIVE 01/16/2016 2011   BILIRUBINUR SMALL (A) 01/16/2016 2011   KETONESUR 15 (A) 01/16/2016 2011   PROTEINUR NEGATIVE 01/16/2016 2011   UROBILINOGEN 0.2 02/22/2015 1118   NITRITE NEGATIVE 01/16/2016 2011   LEUKOCYTESUR NEGATIVE 01/16/2016 2011   Sepsis Labs: @LABRCNTIP (procalcitonin:4,lacticidven:4) )No results found for this or any previous visit (from the past 240 hour(s)).   Radiological Exams on Admission: Dg Chest 2 View  Result Date: 01/16/2016 CLINICAL DATA:  Subacute onset of generalized chest pain, radiating to the back. Initial encounter. EXAM: CHEST  2 VIEW COMPARISON:  Chest radiograph performed 01/08/2016 FINDINGS: Mild bilateral linear airspace opacities may reflect pneumonia or possibly atelectasis. No pleural effusion or pneumothorax is seen. The heart is normal in size. No acute osseous abnormalities are identified. IMPRESSION: Mild bilateral linear opacities may reflect pneumonia or possibly atelectasis. Electronically Signed   By: Garald Balding M.D.   On: 01/16/2016 21:06   Dg Lumbar Spine Complete  Result Date: 01/16/2016 CLINICAL DATA:  Subacute onset of lower back pain. Initial encounter. EXAM: LUMBAR SPINE - COMPLETE 4+ VIEW COMPARISON:  CT of the abdomen and pelvis from 04/13/2015 FINDINGS: There is no evidence of fracture or subluxation. Vertebral bodies demonstrate normal height. Degenerative disc space narrowing is noted along the lower lumbar spine, with scattered lateral osteophytes and endplate sclerotic change. Associated facet disease is noted. The visualized bowel  gas pattern is unremarkable in appearance; air and stool are noted within the colon. The sacroiliac joints are within normal limits. Bilateral hip arthroplasties are incompletely imaged but appear  grossly unremarkable. IMPRESSION: No evidence of fracture or subluxation along the lumbar spine. Mild degenerative change along the lower lumbar spine. Electronically Signed   By: Garald Balding M.D.   On: 01/16/2016 21:22    EKG: Independently reviewed. Normal sinus rhythm with secondary repolarization changes.  Assessment/Plan Active Problems:   Human immunodeficiency virus (HIV) disease (HCC)   Essential hypertension   Substance abuse   COPD (chronic obstructive pulmonary disease) (HCC)   Stage T2a adenocarcinoma of the prostate with a Gleason's score of 3+4 and a PSA of 5.61   Prostate cancer metastatic to bone ([place])   CAP (community acquired pneumonia)   Pneumonia    1. Healthcare associated pneumonia - with chest x-ray showing infiltrates and patient having pleuritic-type of chest pain patient is being empirically treated for healthcare associated pneumonia. LDH is elevated probably from metastatic prostate cancer. Since last CD4 count is 390 in June 2017 and viral load undetectable patient unlikely to be having PCP pneumonia. Patient is not hypoxic. I have ordered CT chest angiogram to rule out any PE since patient has significant pleuritic left chest pain and shortness of breath on exertion. Will cycle cardiac markers. 2. HIV - being followed by Dr. Linus Salmons. Last CD4 count in June 2017 was 390. Continue antiretrovirals. 3. Hypertension - continue enalapril and Lasix. 4. COPD - presently not wheezing continue inhalers. 5. Metastatic prostate cancer - being followed by oncologist.  DVT prophylaxis:  Lovenox. Code Status:  Full code.  Family Communication:  Discussed with patient.  Disposition Plan:  Home.  Consults called:  None.  Admission status:  Observation. Telemetry.     Rise Patience MD [facility] Pager [phone].  If 7PM-7AM, please contact night-coverage [email or web address] Password [facility]  01/17/2016, 12:58 AM

## 2016-01-17 NOTE — Progress Notes (Addendum)
RN called with critical troponin of 0.03. Pt admitted with PNA and chest pain thought to be consistent with pleuritic pain. No EKG changes. Review of troponins is that they are flat. Spoke to [place], Therapist, sports, who states pt is CP free at this time. He has had CP today, but is not magnified and is consistent with pleuritic pain, non radiating, no n/v or diaphoresis. No further action warranted at this time.  KJKG, NP [facility]

## 2016-01-17 NOTE — Care Management Obs Status (Signed)
[place] NOTIFICATION   Patient Details  Name: Billy Lopez. MRN: [id number] Date of Birth: 1941-05-20   Medicare Observation Status Notification Given:  Yes    Leeroy Cha, RN 01/17/2016, 12:39 PM

## 2016-01-17 NOTE — Progress Notes (Signed)
Patient seen and evaluated earlier this a.m. by my associate. Please refer to H&P for details regarding assessment and plan.  Patient's shortness of breath is not getting worse and his chest pain is improved. His last troponin was within normal limits.  General: Patient in no acute distress, alert and awake Cardiovascular: Regular rate and rhythm, no rubs Pulmonary: Speaking in full sentences, no increased work of breathing, equal chest rise  Will see patient next am or sooner should any new concerns arise.   December Hedtke, [employer]

## 2016-01-17 NOTE — Progress Notes (Signed)
CRITICAL VALUE ALERT  Critical value received:  Troponin 0.03  Date of notification:  01/17/16  Time of notification:  0655  Critical value read back:Yes.    Nurse who received alert:  Nancy Fetter, RN  MD notified (1st page):  Wendee Beavers  Time of first page:  0700  MD notified (2nd page):  Time of second page:  Responding MD:    Time MD responded:

## 2016-01-17 NOTE — Progress Notes (Signed)
CRITICAL VALUE ALERT  Critical value received:  Troponin 0.03 Date of notification:  08/16  Time of notification:  1912  Critical value read back:Yes.    Nurse who received alert:  Kenna Gilbert  MD notified (1st page):  Baltazar Najjar   Time of first page:  1912  MD notified (2nd page):  Time of second page:  Responding MD:    Time MD responded:

## 2016-01-17 NOTE — Progress Notes (Signed)
Some blood observed w/ pt BM. MD notified. Will monitor w/ next BM and measure accurately. Kizzie Ide, RN

## 2016-01-17 NOTE — Progress Notes (Signed)
Pharmacy Antibiotic Note  Billy Lopez. is a 75 y.o. male admitted on 01/16/2016 with pneumonia.  In ED patient received Zosyn 3.375gm IV x 1.  Pharmacy has been consulted for Vancomycin dosing and to adjust other antibiotic dosages as needed based on renal function.  Plan:  Vancomycin 1gm IV q12h  Vancomycin trough goal: 15-20 mcg/ml  Continue Cefepime 1gm IV q8h as ordered by MD  F/U cultures, renal function  Height: 5\' 8"  (172.7 cm) Weight: 168 lb (76.2 kg) IBW/kg (Calculated) : 68.4  Temp (24hrs), Avg:98.2 F (36.8 C), Min:97.7 F (36.5 C), Max:98.7 F (37.1 C)   Recent Labs Lab 01/16/16 2050  WBC 5.9  CREATININE 1.06    Estimated Creatinine Clearance: 60 mL/min (by C-G formula based on SCr of 1.06 mg/dL).    Allergies  Allergen Reactions  . Oxycodone Other (See Comments)    loopiness    Antimicrobials this admission: 8/15 Zosyn x 1 8/16 Vanc >>   8/16 Cefepime >>  Dose adjustments this admission:    Microbiology results: 8/16 Sputum: sent   Thank you for allowing pharmacy to be a part of this patient's care.  Everette Rank, PharmD 01/17/2016 1:16 AM

## 2016-01-18 DIAGNOSIS — J189 Pneumonia, unspecified organism: Secondary | ICD-10-CM

## 2016-01-18 NOTE — Progress Notes (Signed)
PROGRESS NOTE    Billy Lopez.  [id number] DOB: 15-Aug-1940 DOA: 01/16/2016 PCP: [name], MD    Brief Narrative:   75 y.o. male with metastatic prostate cancer, hypertension, HIV, COPD and drug abuse who usually ambulates with help of walker or wheelchair presents to the ER because of pleuritic type of right-sided chest pain and shortness of breath. Shortness of breath is on exertion with some cough. Has had subjective feeling of fever and chills. Had come to the ER last week and was treated for pneumonia as outpatient  Assessment & Plan:   1. Healthcare associated pneumonia - with chest x-ray showing infiltrates and patient having pleuritic-type of chest pain patient is being empirically treated for healthcare associated pneumonia. LDH is elevated probably from metastatic prostate cancer. Since last CD4 count is 390 in June 2017 and viral load undetectable patient unlikely to be having PCP pneumonia. Patient is not hypoxic. CT angiogram negative for PE. 2. HIV - being followed by Dr. Linus Salmons. Last CD4 count in June 2017 was 390. Continue antiretrovirals. 3. Hypertension - continue enalapril and Lasix, stable currently 4. COPD - presently not wheezing continue inhalers. 5. Metastatic prostate cancer - being followed by oncologist.  DVT prophylaxis: Lovenox Code Status: Full Family Communication: d/c patient. Disposition Plan: pending improvement in breathing condition   Consultants:   None   Procedures: None   Antimicrobials: Cefepime   Subjective: Pt has no new complaints. States that he feels better.   Objective: Vitals:   01/17/16 1521 01/17/16 2112 01/18/16 0557 01/18/16 1530  BP: (!) 150/103 (!) 155/100 (!) 153/98 117/79  Pulse: 77 73 77 79  Resp: 18 18 18 16   Temp: 99.4 F (37.4 C) 98.4 F (36.9 C) 98 F (36.7 C) 98.9 F (37.2 C)  TempSrc: Oral Oral Oral Oral  SpO2: 97% 98% 99% 97%  Weight:      Height:        Intake/Output Summary (Last 24 hours)  at 01/18/16 1550 Last data filed at 01/18/16 1432  Gross per 24 hour  Intake              340 ml  Output              600 ml  Net             -260 ml   Filed Weights   01/16/16 1758  Weight: 76.2 kg (168 lb)    Examination:  General exam: Appears calm and comfortable, in nad. Respiratory system: no increased wob, no wheezes, equal chest rise. Cardiovascular system: S1 & S2 heard, RRR. No JVD, murmurs, rubs, gallops or clicks. No pedal edema. Gastrointestinal system: Abdomen is nondistended, soft and nontender. No organomegaly or masses felt. Normal bowel sounds heard. Central nervous system: Alert and oriented. No focal neurological deficits. Extremities: Symmetric 5 x 5 power. Skin: No rashes, lesions or ulcers, on limited exam. Psychiatry: Judgement and insight appear normal. Mood & affect appropriate.     Data Reviewed: I have personally reviewed following labs and imaging studies  CBC:  Recent Labs Lab 01/16/16 2050 01/17/16 0600  WBC 5.9 5.2  NEUTROABS 4.0  --   HGB 14.1 12.1*  HCT 42.9 36.9*  MCV 81.4 81.6  PLT 196 [id number]   Basic Metabolic Panel:  Recent Labs Lab 01/16/16 2050 01/17/16 0600  NA 135 135  K 3.8 3.6  CL 106 106  CO2 21* 21*  GLUCOSE 87 110*  BUN 15 17  CREATININE 1.06  1.07  CALCIUM 9.9 9.1   GFR: Estimated Creatinine Clearance: 59.5 mL/min (by C-G formula based on SCr of 1.07 mg/dL). Liver Function Tests:  Recent Labs Lab 01/16/16 2050  AST 42*  ALT 15*  ALKPHOS 2,448*  BILITOT 0.9  PROT 8.4*  ALBUMIN 4.6    Recent Labs Lab 01/16/16 2050  LIPASE 26   No results for input(s): AMMONIA in the last 168 hours. Coagulation Profile: No results for input(s): INR, PROTIME in the last 168 hours. Cardiac Enzymes:  Recent Labs Lab 01/16/16 2314 01/17/16 0600 01/17/16 1243 01/17/16 1819  TROPONINI <0.03 0.03* <0.03 0.03*   BNP (last 3 results) No results for input(s): PROBNP in the last 8760 hours. HbA1C: No results for  input(s): HGBA1C in the last 72 hours. CBG: No results for input(s): GLUCAP in the last 168 hours. Lipid Profile: No results for input(s): CHOL, HDL, LDLCALC, TRIG, CHOLHDL, LDLDIRECT in the last 72 hours. Thyroid Function Tests: No results for input(s): TSH, T4TOTAL, FREET4, T3FREE, THYROIDAB in the last 72 hours. Anemia Panel: No results for input(s): VITAMINB12, FOLATE, FERRITIN, TIBC, IRON, RETICCTPCT in the last 72 hours. Sepsis Labs: No results for input(s): PROCALCITON, LATICACIDVEN in the last 168 hours.  No results found for this or any previous visit (from the past 240 hour(s)).       Radiology Studies: Dg Chest 2 View  Result Date: 01/16/2016 CLINICAL DATA:  Subacute onset of generalized chest pain, radiating to the back. Initial encounter. EXAM: CHEST  2 VIEW COMPARISON:  Chest radiograph performed 01/08/2016 FINDINGS: Mild bilateral linear airspace opacities may reflect pneumonia or possibly atelectasis. No pleural effusion or pneumothorax is seen. The heart is normal in size. No acute osseous abnormalities are identified. IMPRESSION: Mild bilateral linear opacities may reflect pneumonia or possibly atelectasis. Electronically Signed   By: Garald Balding M.D.   On: 01/16/2016 21:06   Dg Lumbar Spine Complete  Result Date: 01/16/2016 CLINICAL DATA:  Subacute onset of lower back pain. Initial encounter. EXAM: LUMBAR SPINE - COMPLETE 4+ VIEW COMPARISON:  CT of the abdomen and pelvis from 04/13/2015 FINDINGS: There is no evidence of fracture or subluxation. Vertebral bodies demonstrate normal height. Degenerative disc space narrowing is noted along the lower lumbar spine, with scattered lateral osteophytes and endplate sclerotic change. Associated facet disease is noted. The visualized bowel gas pattern is unremarkable in appearance; air and stool are noted within the colon. The sacroiliac joints are within normal limits. Bilateral hip arthroplasties are incompletely imaged but  appear grossly unremarkable. IMPRESSION: No evidence of fracture or subluxation along the lumbar spine. Mild degenerative change along the lower lumbar spine. Electronically Signed   By: Garald Balding M.D.   On: 01/16/2016 21:22   Ct Angio Chest Pe W Or Wo Contrast  Result Date: 01/17/2016 CLINICAL DATA:  Chest pain and shortness of Breath EXAM: CT ANGIOGRAPHY CHEST WITH CONTRAST TECHNIQUE: Multidetector CT imaging of the chest was performed using the standard protocol during bolus administration of intravenous contrast. Multiplanar CT image reconstructions and MIPs were obtained to evaluate the vascular anatomy. CONTRAST:  100 mL Isovue 370 COMPARISON:  11/12/2015 FINDINGS: Mediastinum/Lymph Nodes: The thoracic inlet is within normal limits. No hilar or mediastinal adenopathy is identified. Cardiovascular: The thoracic aorta shows no evidence of aneurysmal dilatation or dissection. The pulmonary artery as visualized is somewhat limited by patient motion artifact although no gross central pulmonary embolus is noted. Lungs/Pleura: Again limited by patient respiratory artifact. Mild emphysematous changes are seen. Right lower lobe consolidation  with associated small effusion is noted. No definitive parenchymal nodule are pneumothorax is seen. Upper abdomen: No acute findings. Musculoskeletal: Multiple lytic and sclerotic lesions are noted throughout the bony structures consistent with the given clinical history of metastatic disease. These appear slightly improved when compared with the prior exam consistent with therapy Review of the MIP images confirms the above findings. IMPRESSION: No evidence of pulmonary emboli although the examination is somewhat limited by patient motion artifact. Right lower lobe infiltrate with associated small effusion. Changes of metastatic disease to the bones with some improvement when compared with the prior exam consistent with therapy. Electronically Signed   By: Inez Catalina  M.D.   On: 01/17/2016 11:46        Scheduled Meds: . ceFEPime (MAXIPIME) IV  1 g Intravenous Q8H  . dolutegravir  50 mg Oral Daily  . emtricitabine-tenofovir AF  1 tablet Oral QHS  . enalapril  20 mg Oral Daily  . enoxaparin (LOVENOX) injection  40 mg Subcutaneous Q24H  . feeding supplement (ENSURE ENLIVE)  237 mL Oral BID BM  . furosemide  20 mg Oral Daily  . potassium chloride  10 mEq Oral Daily  . tamsulosin  0.4 mg Oral BID  . vancomycin  1,000 mg Intravenous Q12H  . vitamin A  10,000 Units Oral QPM  . vitamin C  500 mg Oral QPM   Continuous Infusions:    LOS: 1 day    Time spent: > 35 minutes    Velvet Bathe, MD [facility] Pager [phone]  If 7PM-7AM, please contact night-coverage [email or web address] Password [email or web address] 01/18/2016, 3:50 PM

## 2016-01-18 NOTE — Consult Note (Signed)
   [facility] CM Inpatient Consult   01/18/2016  Crowley Krasner 12/16/1940 [id number]    Mr. Stavely screened for potential [facility] Care Management services. Went to bedside to offer [place] Management services if needed. Chart reviewed and noted [facility] contacted patient in the past for [place] Management services and Mr. Franssen declined at that time. Mr. Nabors was sleeping soundly when writer attempted bedside visit.   Marthenia Rolling, MSN-Ed, RN,BSN [facility] Liaison [phone]

## 2016-01-19 LAB — BASIC METABOLIC PANEL
Anion gap: 6 (ref 5–15)
BUN: 13 mg/dL (ref 6–20)
CALCIUM: 9.1 mg/dL (ref 8.9–10.3)
CHLORIDE: 109 mmol/L (ref 101–111)
CO2: 20 mmol/L — AB (ref 22–32)
CREATININE: 0.95 mg/dL (ref 0.61–1.24)
GFR calc non Af Amer: >60 mL/min (ref >60)
GLUCOSE: 103 mg/dL — AB (ref 65–99)
POTASSIUM: 3.9 mmol/L (ref 3.5–5.1)
SODIUM: 135 mmol/L (ref 135–145)

## 2016-01-19 LAB — CBC
HCT: 34.8 % — ABNORMAL LOW (ref 39.0–52.0)
Hemoglobin: 11.7 g/dL — ABNORMAL LOW (ref 13.0–17.0)
MCH: 26.6 pg (ref 26.0–34.0)
MCHC: 33.6 g/dL (ref 30.0–36.0)
MCV: 79.1 fL (ref 78.0–100.0)
PLATELETS: 147 10*3/uL — AB (ref 150–400)
RBC: 4.4 MIL/uL (ref 4.22–5.81)
RDW: 15.6 % — ABNORMAL HIGH (ref 11.5–15.5)
WBC: 5.6 10*3/uL (ref 4.0–10.5)

## 2016-01-19 MED ORDER — LEVOFLOXACIN 750 MG PO TABS
750.0000 mg | ORAL_TABLET | Freq: Every day | ORAL | Status: DC
  Administered 2016-01-19 – 2016-01-22 (×4): 750 mg via ORAL
  Filled 2016-01-19 (×4): qty 1

## 2016-01-19 MED ORDER — HYDROMORPHONE HCL 2 MG PO TABS
4.0000 mg | ORAL_TABLET | Freq: Four times a day (QID) | ORAL | Status: DC | PRN
  Administered 2016-01-19 – 2016-01-22 (×10): 4 mg via ORAL
  Filled 2016-01-19 (×10): qty 2

## 2016-01-19 NOTE — Progress Notes (Signed)
PROGRESS NOTE    Billy Lopez.  [id number] DOB: 13-May-1941 DOA: 01/16/2016 PCP: [name], MD    Brief Narrative:   75 y.o. male with metastatic prostate cancer, hypertension, HIV, COPD and drug abuse who usually ambulates with help of walker or wheelchair presents to the ER because of pleuritic type of right-sided chest pain and shortness of breath. Shortness of breath is on exertion with some cough. Has had subjective feeling of fever and chills. Had come to the ER last week and was treated for pneumonia as outpatient  Assessment & Plan:   1. Healthcare associated pneumonia - with chest x-ray showing infiltrates and patient having pleuritic-type of chest pain patient is being empirically treated for healthcare associated pneumonia. LDH is elevated probably from metastatic prostate cancer. Since last CD4 count is 390 in June 2017 and viral load undetectable patient unlikely to be having PCP pneumonia. Patient is not hypoxic. CT angiogram negative for PE. Will trasition to Levaquin.  2. HIV - being followed by Dr. Linus Salmons. Last CD4 count in June 2017 was 390. Continue antiretrovirals. 3. Hypertension - continue enalapril and Lasix, stable currently 4. COPD - presently not wheezing continue inhalers. 5. Metastatic prostate cancer - being followed by oncologist. 6. Chest pain- described at left side of the chest, burning and aching. Coughing makes it worse. As such most likely pleuritic chest pain sequelae from principle problem. Will increase patient's oral dilaudid dose temporarily. Obtain physical therapy.  DVT prophylaxis: Lovenox Code Status: Full Family Communication: d/c patient. Disposition Plan: awaiting physical therapy evaluation   Consultants:   None   Procedures: None   Antimicrobials: Cefepime   Subjective: Pt complaining of pleuritic chest pain. States it is affecting his mobility. He feels like he is not ready for discharge as a result. I have explained that  we need to transition him soon to decrease his chances of obtaining a nosocomial infection. Patient aware and verbalizes understanding and agreement.  Objective: Vitals:   01/18/16 1530 01/18/16 2127 01/18/16 2150 01/19/16 0606  BP: 117/79 (!) 148/102 122/70 109/73  Pulse: 79 70  71  Resp: 16 17  17   Temp: 98.9 F (37.2 C) 99.2 F (37.3 C)  98.7 F (37.1 C)  TempSrc: Oral Oral  Oral  SpO2: 97% 100%  95%  Weight:      Height:        Intake/Output Summary (Last 24 hours) at 01/19/16 1052 Last data filed at 01/19/16 0925  Gross per 24 hour  Intake              340 ml  Output              600 ml  Net             -260 ml   Filed Weights   01/16/16 1758  Weight: 76.2 kg (168 lb)    Examination:  General exam: Appears calm and comfortable, in nad. Respiratory system: no increased wob, no wheezes, equal chest rise. Speaking in full sentences Cardiovascular system: S1 & S2 heard, RRR. No JVD, murmurs, rubs, gallops or clicks. No pedal edema. Gastrointestinal system: Abdomen is nondistended, soft and nontender. No organomegaly or masses felt. Normal bowel sounds heard. Central nervous system: Alert and oriented. No focal neurological deficits. Extremities: Symmetric 5 x 5 power. Skin: No rashes, lesions or ulcers, on limited exam. Psychiatry: Judgement and insight appear normal. Mood & affect appropriate.     Data Reviewed: I have personally reviewed following  labs and imaging studies  CBC:  Recent Labs Lab 01/16/16 2050 01/17/16 0600 01/19/16 0508  WBC 5.9 5.2 5.6  NEUTROABS 4.0  --   --   HGB 14.1 12.1* 11.7*  HCT 42.9 36.9* 34.8*  MCV 81.4 81.6 79.1  PLT 196 168 Q000111Q*   Basic Metabolic Panel:  Recent Labs Lab 01/16/16 2050 01/17/16 0600 01/19/16 0508  NA 135 135 135  K 3.8 3.6 3.9  CL 106 106 109  CO2 21* 21* 20*  GLUCOSE 87 110* 103*  BUN 15 17 13   CREATININE 1.06 1.07 0.95  CALCIUM 9.9 9.1 9.1   GFR: Estimated Creatinine Clearance: 67 mL/min (by  C-G formula based on SCr of 0.95 mg/dL). Liver Function Tests:  Recent Labs Lab 01/16/16 2050  AST 42*  ALT 15*  ALKPHOS 2,448*  BILITOT 0.9  PROT 8.4*  ALBUMIN 4.6    Recent Labs Lab 01/16/16 2050  LIPASE 26   No results for input(s): AMMONIA in the last 168 hours. Coagulation Profile: No results for input(s): INR, PROTIME in the last 168 hours. Cardiac Enzymes:  Recent Labs Lab 01/16/16 2314 01/17/16 0600 01/17/16 1243 01/17/16 1819  TROPONINI <0.03 0.03* <0.03 0.03*   BNP (last 3 results) No results for input(s): PROBNP in the last 8760 hours. HbA1C: No results for input(s): HGBA1C in the last 72 hours. CBG: No results for input(s): GLUCAP in the last 168 hours. Lipid Profile: No results for input(s): CHOL, HDL, LDLCALC, TRIG, CHOLHDL, LDLDIRECT in the last 72 hours. Thyroid Function Tests: No results for input(s): TSH, T4TOTAL, FREET4, T3FREE, THYROIDAB in the last 72 hours. Anemia Panel: No results for input(s): VITAMINB12, FOLATE, FERRITIN, TIBC, IRON, RETICCTPCT in the last 72 hours. Sepsis Labs: No results for input(s): PROCALCITON, LATICACIDVEN in the last 168 hours.  No results found for this or any previous visit (from the past 240 hour(s)).       Radiology Studies: Ct Angio Chest Pe W Or Wo Contrast  Result Date: 01/17/2016 CLINICAL DATA:  Chest pain and shortness of Breath EXAM: CT ANGIOGRAPHY CHEST WITH CONTRAST TECHNIQUE: Multidetector CT imaging of the chest was performed using the standard protocol during bolus administration of intravenous contrast. Multiplanar CT image reconstructions and MIPs were obtained to evaluate the vascular anatomy. CONTRAST:  100 mL Isovue 370 COMPARISON:  11/12/2015 FINDINGS: Mediastinum/Lymph Nodes: The thoracic inlet is within normal limits. No hilar or mediastinal adenopathy is identified. Cardiovascular: The thoracic aorta shows no evidence of aneurysmal dilatation or dissection. The pulmonary artery as  visualized is somewhat limited by patient motion artifact although no gross central pulmonary embolus is noted. Lungs/Pleura: Again limited by patient respiratory artifact. Mild emphysematous changes are seen. Right lower lobe consolidation with associated small effusion is noted. No definitive parenchymal nodule are pneumothorax is seen. Upper abdomen: No acute findings. Musculoskeletal: Multiple lytic and sclerotic lesions are noted throughout the bony structures consistent with the given clinical history of metastatic disease. These appear slightly improved when compared with the prior exam consistent with therapy Review of the MIP images confirms the above findings. IMPRESSION: No evidence of pulmonary emboli although the examination is somewhat limited by patient motion artifact. Right lower lobe infiltrate with associated small effusion. Changes of metastatic disease to the bones with some improvement when compared with the prior exam consistent with therapy. Electronically Signed   By: Inez Catalina M.D.   On: 01/17/2016 11:46        Scheduled Meds: . dolutegravir  50 mg Oral Daily  .  emtricitabine-tenofovir AF  1 tablet Oral QHS  . enalapril  20 mg Oral Daily  . enoxaparin (LOVENOX) injection  40 mg Subcutaneous Q24H  . feeding supplement (ENSURE ENLIVE)  237 mL Oral BID BM  . furosemide  20 mg Oral Daily  . levofloxacin  750 mg Oral Daily  . potassium chloride  10 mEq Oral Daily  . tamsulosin  0.4 mg Oral BID  . vitamin A  10,000 Units Oral QPM  . vitamin C  500 mg Oral QPM   Continuous Infusions:    LOS: 2 days    Time spent: > 35 minutes    Velvet Bathe, MD [facility] Pager [phone]  If 7PM-7AM, please contact night-coverage [email or web address] Password [facility] 01/19/2016, 10:52 AM

## 2016-01-19 NOTE — Progress Notes (Addendum)
Pharmacy Antibiotic Note  Billy Lopez. is a 75 y.o. male with HIV, metastatic prostate cancer, and COPD admitted on 01/16/2016 with HCAP after failing outpt azithromycin for CAP.  Pt also with pleuritic chest pain.  CTA negative for PE.  Last CD4 390 in June 2017 and VL undetectable therefore PCP PNA unlikely.   Pharmacy dosing Vancomycin and MD dosing cefepime.   8/18: D3 Abx -renal function stable -no cultures  Plan:  Continue Vancomycin 1g IV q12h  Vancomycin trough goal: 15-20 mcg/ml  Consider VT this weekend if abx not narrowed  Continue Cefepime 1gm IV q8h as ordered by MD  F/U cultures, renal function  Height: 5\' 8"  (172.7 cm) Weight: 168 lb (76.2 kg) IBW/kg (Calculated) : 68.4  Temp (24hrs), Avg:98.9 F (37.2 C), Min:98.7 F (37.1 C), Max:99.2 F (37.3 C)   Recent Labs Lab 01/16/16 2050 01/17/16 0600 01/19/16 0508  WBC 5.9 5.2 5.6  CREATININE 1.06 1.07 0.95    Estimated Creatinine Clearance: 67 mL/min (by C-G formula based on SCr of 0.95 mg/dL).    Allergies  Allergen Reactions  . Oxycodone Other (See Comments)    loopiness    Antimicrobials this admission: 8/15 Zosyn x 1 8/16 Vanc >>   8/16 Cefepime >>  Dose adjustments this admission:  None   Microbiology results: 8/16 Sputum: sent   Thank you for allowing pharmacy to be a part of this patient's care.  Ralene Bathe, PharmD, BCPS 01/19/2016, 7:08 AM  Pager: [phone]   Addendum: Abx narrowed to Levaquin, oral abx ok with MD -Levaquin 750mg  PO q24h for CrCl > 50 ml/min  Dosage remains stable and need for further dosage adjustment appears unlikely at present.    Will sign off at this time.  Please reconsult if a change in clinical status warrants re-evaluation of dosage.  Ralene Bathe, PharmD, BCPS 01/19/2016, 9:59 AM  Pager: [phone]

## 2016-01-20 NOTE — Evaluation (Signed)
Physical Therapy Evaluation Patient Details Name: Billy Lopez. MRN: [id number] DOB: 1941/02/23 Today's Date: 01/20/2016   History of Present Illness  75 y.o. male with metastatic prostate cancer, hypertension, HIV, COPD and drug abuse who usually ambulates with help of walker or wheelchair presents to the ER because of pleuritic type of right-sided chest pain and shortness of breath. Shortness of breath is on exertion with some cough  Clinical Impression  The patient reports the back pain is not controlled. He is asking about a" patch" that he had in the past that was beneficial. . He expresses concern that he now hs bone involvement of the spine. The patient reports ambulating a short distance 1 week ago and now having difficulty with control of the legs and back pain. He reports that he has been to "rehab" in the past and is amenable to going.  Pt admitted with above diagnosis. Pt currently with functional limitations due to the deficits listed below (see PT Problem List).  Pt will benefit from skilled PT to increase their independence and safety with mobility to allow discharge to the venue listed below.        Follow Up Recommendations SNF;Supervision/Assistance - 24 hour    Equipment Recommendations  None recommended by PT    Recommendations for Other Services OT consult     Precautions / Restrictions Precautions Precautions: Fall Restrictions Weight Bearing Restrictions: No      Mobility  Bed Mobility Overal bed mobility: Needs Assistance Bed Mobility: Supine to Sit     Supine to sit: Min assist     General bed mobility comments: extra time to work through the spasms of the back.  Transfers Overall transfer level: Needs assistance Equipment used: Rolling walker (2 wheeled) Transfers: Sit to/from [facility] Sit to Stand: Mod assist Stand pivot transfers: Mod assist       General transfer comment: assist to rise and steady after standing.  small shuffle steps  to transfer to the recliner. Slightly ataxic like  movements of the legs, somewhat difficult to assess with such few steps.  Ambulation/Gait                Stairs            Wheelchair Mobility    Modified Rankin (Stroke Patients Only)       Balance Overall balance assessment: Needs assistance;History of Falls Sitting-balance support: Feet supported;Bilateral upper extremity supported Sitting balance-Leahy Scale: Fair Sitting balance - Comments: guarding with arms  for back pain.   Standing balance support: Bilateral upper extremity supported;During functional activity Standing balance-Leahy Scale: Poor                               Pertinent Vitals/Pain Pain Assessment: 0-10 Pain Score: 10-Worst pain ever Pain Location: cback Pain Descriptors / Indicators: Cramping;Discomfort;Grimacing;Guarding Pain Intervention(s): Limited activity within patient's tolerance;Premedicated before session    Home Living Family/patient expects to be discharged to:: Private residence Living Arrangements: Alone Available Help at Discharge: Personal care attendant Type of Home: Apartment Home Access: [place]: One level Home Equipment: Environmental consultant - 2 wheels;Wheelchair - Passenger transport manager - single point;Bedside commode;Shower seat      Prior Function Level of Independence: Independent with assistive device(s)               Hand Dominance        Extremity/Trunk Assessment   Upper  Extremity Assessment: RUE deficits/detail RUE Deficits / Details: decreased grip         Lower Extremity Assessment: RLE deficits/detail;LLE deficits/detail RLE Deficits / Details: grossly 3+ throughout LLE Deficits / Details: grossly 4 throughout     Communication   Communication: No difficulties  Cognition Arousal/Alertness: Awake/alert Behavior During Therapy: WFL for tasks assessed/performed;Anxious (about his  pain) Overall Cognitive Status: Within Functional Limits for tasks assessed                      General Comments      Exercises        Assessment/Plan    PT Assessment Patient needs continued PT services  PT Diagnosis Difficulty walking;Generalized weakness;Acute pain   PT Problem List Decreased strength;Decreased activity tolerance;Decreased balance;Decreased mobility;Decreased knowledge of precautions;Pain  PT Treatment Interventions DME instruction;Gait training;Functional mobility training;Therapeutic activities;Therapeutic exercise;Patient/family education   PT Goals (Current goals can be found in the Care Plan section) Acute Rehab PT Goals Patient Stated Goal: to walk again PT Goal Formulation: With patient Time For Goal Achievement: 02/03/16 Potential to Achieve Goals: Fair    Frequency Min 3X/week   Barriers to discharge Decreased caregiver support      Co-evaluation               End of Session Equipment Utilized During Treatment: Gait belt Activity Tolerance: Patient limited by lethargy Patient left: in chair;with call bell/phone within reach;with chair alarm set Nurse Communication: Mobility status         Time: [id number] PT Time Calculation (min) (ACUTE ONLY): 29 min   Charges:   PT Evaluation $PT Eval Moderate Complexity: 1 Procedure PT Treatments $Therapeutic Activity: 8-22 mins   PT G Codes:        Claretha Cooper 01/20/2016, 11:54 AM Tresa Endo PT [phone]

## 2016-01-20 NOTE — Progress Notes (Signed)
PROGRESS NOTE    Billy Lopez.  [id number] DOB: [birth year] DOA: 01/16/2016 PCP: [name], MD    Brief Narrative:   75 y.o. male with metastatic prostate cancer, hypertension, HIV, COPD and drug abuse who usually ambulates with help of walker or wheelchair presents to the ER because of pleuritic type of right-sided chest pain and shortness of breath. Shortness of breath is on exertion with some cough. Has had subjective feeling of fever and chills. Had come to the ER last week and was treated for pneumonia as outpatient  Assessment & Plan:   1. Healthcare associated pneumonia - with chest x-ray showing infiltrates and patient having pleuritic-type of chest pain patient is being empirically treated for healthcare associated pneumonia. LDH is elevated probably from metastatic prostate cancer. Since last CD4 count is 390 in June 2017 and viral load undetectable patient unlikely to be having PCP pneumonia. Patient is not hypoxic. CT angiogram negative for PE. Will trasition to Levaquin.  2. HIV - being followed by Dr. Linus Salmons. Last CD4 count in June 2017 was 390. Continue antiretrovirals. 3. Hypertension - continue enalapril and Lasix, stable currently 4. COPD - presently not wheezing continue inhalers. 5. Metastatic prostate cancer - being followed by oncologist. 6. Chest pain- described at left side of the chest, burning and aching. Coughing makes it worse. He reports improvement with the oral dilaudid. But he states that the pain is limiting his mobility  DVT prophylaxis: Lovenox Code Status: Full Family Communication: d/c patient. Disposition Plan: awaiting physical therapy evaluation   Consultants:   None   Procedures: None   Antimicrobials: Cefepime   Subjective: Pt complaining of pleuritic chest pain. States the pain is so uncomfortable that is limiting his mobility.  Objective: Vitals:   01/19/16 0606 01/19/16 1513 01/19/16 2028 01/20/16 0556  BP: 109/73 96/69  119/78 105/82  Pulse: 71 84 80 77  Resp: 17 20 18 19   Temp: 98.7 F (37.1 C) 98.5 F (36.9 C) 99.3 F (37.4 C) 98.9 F (37.2 C)  TempSrc: Oral Oral Oral Oral  SpO2: 95% 97% 97% 95%  Weight:      Height:        Intake/Output Summary (Last 24 hours) at 01/20/16 1046 Last data filed at 01/19/16 2252  Gross per 24 hour  Intake              640 ml  Output              350 ml  Net              290 ml   Filed Weights   01/16/16 1758  Weight: 76.2 kg (168 lb)    Examination:  General exam: Appears calm and comfortable, in nad. Respiratory system: no increased wob, no wheezes, equal chest rise. Speaking in full sentences Cardiovascular system: S1 & S2 heard, RRR. No JVD, murmurs, rubs, gallops or clicks. No pedal edema. Gastrointestinal system: Abdomen is nondistended, soft and nontender. No organomegaly or masses felt. Normal bowel sounds heard. Central nervous system: Alert and oriented. No focal neurological deficits. Extremities: Symmetric 5 x 5 power. Skin: No rashes, lesions or ulcers, on limited exam. Psychiatry: Judgement and insight appear normal. Mood & affect appropriate.     Data Reviewed: I have personally reviewed following labs and imaging studies  CBC:  Recent Labs Lab 01/16/16 2050 01/17/16 0600 01/19/16 0508  WBC 5.9 5.2 5.6  NEUTROABS 4.0  --   --   HGB 14.1 12.1*  11.7*  HCT 42.9 36.9* 34.8*  MCV 81.4 81.6 79.1  PLT 196 168 Q000111Q*   Basic Metabolic Panel:  Recent Labs Lab 01/16/16 2050 01/17/16 0600 01/19/16 0508  NA 135 135 135  K 3.8 3.6 3.9  CL 106 106 109  CO2 21* 21* 20*  GLUCOSE 87 110* 103*  BUN 15 17 13   CREATININE 1.06 1.07 0.95  CALCIUM 9.9 9.1 9.1   GFR: Estimated Creatinine Clearance: 67 mL/min (by C-G formula based on SCr of 0.95 mg/dL). Liver Function Tests:  Recent Labs Lab 01/16/16 2050  AST 42*  ALT 15*  ALKPHOS 2,448*  BILITOT 0.9  PROT 8.4*  ALBUMIN 4.6    Recent Labs Lab 01/16/16 2050  LIPASE 26    No results for input(s): AMMONIA in the last 168 hours. Coagulation Profile: No results for input(s): INR, PROTIME in the last 168 hours. Cardiac Enzymes:  Recent Labs Lab 01/16/16 2314 01/17/16 0600 01/17/16 1243 01/17/16 1819  TROPONINI <0.03 0.03* <0.03 0.03*   BNP (last 3 results) No results for input(s): PROBNP in the last 8760 hours. HbA1C: No results for input(s): HGBA1C in the last 72 hours. CBG: No results for input(s): GLUCAP in the last 168 hours. Lipid Profile: No results for input(s): CHOL, HDL, LDLCALC, TRIG, CHOLHDL, LDLDIRECT in the last 72 hours. Thyroid Function Tests: No results for input(s): TSH, T4TOTAL, FREET4, T3FREE, THYROIDAB in the last 72 hours. Anemia Panel: No results for input(s): VITAMINB12, FOLATE, FERRITIN, TIBC, IRON, RETICCTPCT in the last 72 hours. Sepsis Labs: No results for input(s): PROCALCITON, LATICACIDVEN in the last 168 hours.  No results found for this or any previous visit (from the past 240 hour(s)).       Radiology Studies: No results found.      Scheduled Meds: . dolutegravir  50 mg Oral Daily  . emtricitabine-tenofovir AF  1 tablet Oral QHS  . enalapril  20 mg Oral Daily  . enoxaparin (LOVENOX) injection  40 mg Subcutaneous Q24H  . feeding supplement (ENSURE ENLIVE)  237 mL Oral BID BM  . furosemide  20 mg Oral Daily  . levofloxacin  750 mg Oral Daily  . potassium chloride  10 mEq Oral Daily  . tamsulosin  0.4 mg Oral BID  . vitamin A  10,000 Units Oral QPM  . vitamin C  500 mg Oral QPM   Continuous Infusions:    LOS: 3 days   Time spent: > 35 minutes  Velvet Bathe, MD [facility] Pager [phone]  If 7PM-7AM, please contact night-coverage [email or web address] Password [facility] 01/20/2016, 10:46 AM

## 2016-01-20 NOTE — Progress Notes (Signed)
PT Cancellation Note  Patient Details Name: Billy Lopez. MRN: [id number] DOB: 1940-10-31   Cancelled Treatment:    Reason Eval/Treat Not Completed: Pain limiting ability to participate (RN giving pain meds. will check back later.)   Claretha Cooper 01/20/2016, 8:51 AM Tresa Endo PT [phone]

## 2016-01-21 NOTE — Progress Notes (Signed)
PROGRESS NOTE    Billy Lopez.  [id number] DOB: September 01, 1940 DOA: 01/16/2016 PCP: [name], MD    Brief Narrative:   75 y.o. male with metastatic prostate cancer, hypertension, HIV, COPD and drug abuse who usually ambulates with help of walker or wheelchair presents to the ER because of pleuritic type of right-sided chest pain and shortness of breath. Shortness of breath is on exertion with some cough. Has had subjective feeling of fever and chills. Had come to the ER last week and was treated for pneumonia as outpatient  Assessment & Plan:   1. Healthcare associated pneumonia - with chest x-ray showing infiltrates and patient having pleuritic-type of chest pain patient is being empirically treated for healthcare associated pneumonia. LDH is elevated probably from metastatic prostate cancer. Since last CD4 count is 390 in June 2017 and viral load undetectable patient unlikely to be having PCP pneumonia. Patient is not hypoxic. CT angiogram negative for PE. He reports doing better on oral levaquin.  2. HIV - being followed by Dr. Linus Salmons. Last CD4 count in June 2017 was 390. Continue antiretrovirals. 3. Hypertension - continue enalapril and Lasix, stable currently 4. COPD - presently not wheezing continue inhalers. 5. Metastatic prostate cancer - being followed by oncologist. 6. Chest pain- described at left side of the chest, burning and aching. Coughing makes it worse. He reports improvement with the oral dilaudid. But he states that the pain is limiting his mobility  DVT prophylaxis: Lovenox Code Status: Full Family Communication: d/c patient. Disposition Plan: awaiting placement into SNF   Consultants:   None   Procedures: None   Antimicrobials: Cefepime   Subjective: Pt reports feeling better. Looking forward to physical therapy at SNF  Objective: Vitals:   01/20/16 1422 01/20/16 2000 01/21/16 0445 01/21/16 0447  BP: (!) 124/104 108/74  101/75  Pulse: 93 93   77  Resp: 18 16  17   Temp: 98.4 F (36.9 C) 99.3 F (37.4 C)  98.9 F (37.2 C)  TempSrc: Oral Oral  Oral  SpO2: 97% 95%  96%  Weight:   65.6 kg (144 lb 10 oz)   Height:        Intake/Output Summary (Last 24 hours) at 01/21/16 1544 Last data filed at 01/21/16 1050  Gross per 24 hour  Intake                0 ml  Output              650 ml  Net             -650 ml   Filed Weights   01/16/16 1758 01/21/16 0445  Weight: 76.2 kg (168 lb) 65.6 kg (144 lb 10 oz)    Examination:  General exam: Appears calm and comfortable, in nad. Respiratory system: no increased wob, no wheezes, equal chest rise. Speaking in full sentences Cardiovascular system: S1 & S2 heard, RRR. No JVD, murmurs, rubs, gallops or clicks. No pedal edema. Gastrointestinal system: Abdomen is nondistended, soft and nontender. No organomegaly or masses felt. Normal bowel sounds heard. Central nervous system: Alert and oriented. No focal neurological deficits. Extremities: Symmetric 5 x 5 power. Skin: No rashes, lesions or ulcers, on limited exam. Psychiatry: Judgement and insight appear normal. Mood & affect appropriate.     Data Reviewed: I have personally reviewed following labs and imaging studies  CBC:  Recent Labs Lab 01/16/16 2050 01/17/16 0600 01/19/16 0508  WBC 5.9 5.2 5.6  NEUTROABS 4.0  --   --  HGB 14.1 12.1* 11.7*  HCT 42.9 36.9* 34.8*  MCV 81.4 81.6 79.1  PLT 196 168 [id number]*   Basic Metabolic Panel:  Recent Labs Lab 01/16/16 2050 01/17/16 0600 01/19/16 0508  NA 135 135 135  K 3.8 3.6 3.9  CL 106 106 109  CO2 21* 21* 20*  GLUCOSE 87 110* 103*  BUN 15 17 13   CREATININE 1.06 1.07 0.95  CALCIUM 9.9 9.1 9.1   GFR: Estimated Creatinine Clearance: 64.3 mL/min (by C-G formula based on SCr of 0.95 mg/dL). Liver Function Tests:  Recent Labs Lab 01/16/16 2050  AST 42*  ALT 15*  ALKPHOS 2,448*  BILITOT 0.9  PROT 8.4*  ALBUMIN 4.6    Recent Labs Lab 01/16/16 2050  LIPASE 26    No results for input(s): AMMONIA in the last 168 hours. Coagulation Profile: No results for input(s): INR, PROTIME in the last 168 hours. Cardiac Enzymes:  Recent Labs Lab 01/16/16 2314 01/17/16 0600 01/17/16 1243 01/17/16 1819  TROPONINI <0.03 0.03* <0.03 0.03*   BNP (last 3 results) No results for input(s): PROBNP in the last 8760 hours. HbA1C: No results for input(s): HGBA1C in the last 72 hours. CBG: No results for input(s): GLUCAP in the last 168 hours. Lipid Profile: No results for input(s): CHOL, HDL, LDLCALC, TRIG, CHOLHDL, LDLDIRECT in the last 72 hours. Thyroid Function Tests: No results for input(s): TSH, T4TOTAL, FREET4, T3FREE, THYROIDAB in the last 72 hours. Anemia Panel: No results for input(s): VITAMINB12, FOLATE, FERRITIN, TIBC, IRON, RETICCTPCT in the last 72 hours. Sepsis Labs: No results for input(s): PROCALCITON, LATICACIDVEN in the last 168 hours.  No results found for this or any previous visit (from the past 240 hour(s)).       Radiology Studies: No results found.      Scheduled Meds: . dolutegravir  50 mg Oral Daily  . emtricitabine-tenofovir AF  1 tablet Oral QHS  . enalapril  20 mg Oral Daily  . enoxaparin (LOVENOX) injection  40 mg Subcutaneous Q24H  . feeding supplement (ENSURE ENLIVE)  237 mL Oral BID BM  . furosemide  20 mg Oral Daily  . levofloxacin  750 mg Oral Daily  . potassium chloride  10 mEq Oral Daily  . tamsulosin  0.4 mg Oral BID  . vitamin A  10,000 Units Oral QPM  . vitamin C  500 mg Oral QPM   Continuous Infusions:    LOS: 4 days   Time spent: > 35 minutes  Velvet Bathe, MD [facility] Pager [phone]  If 7PM-7AM, please contact night-coverage [email or web address] Password [facility] 01/21/2016, 3:44 PM

## 2016-01-21 NOTE — Care Management Important Message (Signed)
Important Message  Patient Details  Name: Billy Lopez. MRN: [id number] Date of Birth: 1941/02/27   Medicare Important Message Given:  Yes    Apolonio Schneiders, RN 01/21/2016, 3:00 PM

## 2016-01-22 DIAGNOSIS — R531 Weakness: Secondary | ICD-10-CM | POA: Diagnosis not present

## 2016-01-22 DIAGNOSIS — D649 Anemia, unspecified: Secondary | ICD-10-CM | POA: Diagnosis not present

## 2016-01-22 DIAGNOSIS — Z923 Personal history of irradiation: Secondary | ICD-10-CM | POA: Diagnosis not present

## 2016-01-22 DIAGNOSIS — G9529 Other cord compression: Secondary | ICD-10-CM | POA: Diagnosis not present

## 2016-01-22 DIAGNOSIS — K921 Melena: Secondary | ICD-10-CM | POA: Diagnosis not present

## 2016-01-22 DIAGNOSIS — J189 Pneumonia, unspecified organism: Secondary | ICD-10-CM | POA: Diagnosis not present

## 2016-01-22 DIAGNOSIS — Z8701 Personal history of pneumonia (recurrent): Secondary | ICD-10-CM | POA: Diagnosis not present

## 2016-01-22 DIAGNOSIS — M8448XA Pathological fracture, other site, initial encounter for fracture: Secondary | ICD-10-CM | POA: Diagnosis not present

## 2016-01-22 DIAGNOSIS — R29898 Other symptoms and signs involving the musculoskeletal system: Secondary | ICD-10-CM | POA: Diagnosis not present

## 2016-01-22 DIAGNOSIS — D631 Anemia in chronic kidney disease: Secondary | ICD-10-CM | POA: Diagnosis not present

## 2016-01-22 DIAGNOSIS — C61 Malignant neoplasm of prostate: Secondary | ICD-10-CM | POA: Diagnosis not present

## 2016-01-22 DIAGNOSIS — G893 Neoplasm related pain (acute) (chronic): Secondary | ICD-10-CM | POA: Diagnosis not present

## 2016-01-22 DIAGNOSIS — I10 Essential (primary) hypertension: Secondary | ICD-10-CM | POA: Diagnosis not present

## 2016-01-22 DIAGNOSIS — K649 Unspecified hemorrhoids: Secondary | ICD-10-CM | POA: Diagnosis present

## 2016-01-22 DIAGNOSIS — G8220 Paraplegia, unspecified: Secondary | ICD-10-CM | POA: Diagnosis not present

## 2016-01-22 DIAGNOSIS — M5415 Radiculopathy, thoracolumbar region: Secondary | ICD-10-CM | POA: Diagnosis not present

## 2016-01-22 DIAGNOSIS — R0602 Shortness of breath: Secondary | ICD-10-CM | POA: Diagnosis not present

## 2016-01-22 DIAGNOSIS — F19129 Other psychoactive substance abuse with intoxication, unspecified: Secondary | ICD-10-CM | POA: Diagnosis not present

## 2016-01-22 DIAGNOSIS — B20 Human immunodeficiency virus [HIV] disease: Secondary | ICD-10-CM | POA: Diagnosis not present

## 2016-01-22 DIAGNOSIS — R5381 Other malaise: Secondary | ICD-10-CM | POA: Diagnosis not present

## 2016-01-22 DIAGNOSIS — R2689 Other abnormalities of gait and mobility: Secondary | ICD-10-CM | POA: Diagnosis not present

## 2016-01-22 DIAGNOSIS — H9192 Unspecified hearing loss, left ear: Secondary | ICD-10-CM | POA: Diagnosis present

## 2016-01-22 DIAGNOSIS — G8929 Other chronic pain: Secondary | ICD-10-CM | POA: Diagnosis not present

## 2016-01-22 DIAGNOSIS — K219 Gastro-esophageal reflux disease without esophagitis: Secondary | ICD-10-CM | POA: Diagnosis present

## 2016-01-22 DIAGNOSIS — J449 Chronic obstructive pulmonary disease, unspecified: Secondary | ICD-10-CM | POA: Diagnosis not present

## 2016-01-22 DIAGNOSIS — B182 Chronic viral hepatitis C: Secondary | ICD-10-CM | POA: Diagnosis present

## 2016-01-22 DIAGNOSIS — N183 Chronic kidney disease, stage 3 (moderate): Secondary | ICD-10-CM | POA: Diagnosis not present

## 2016-01-22 DIAGNOSIS — Z96643 Presence of artificial hip joint, bilateral: Secondary | ICD-10-CM | POA: Diagnosis present

## 2016-01-22 DIAGNOSIS — I6789 Other cerebrovascular disease: Secondary | ICD-10-CM | POA: Diagnosis not present

## 2016-01-22 DIAGNOSIS — K5900 Constipation, unspecified: Secondary | ICD-10-CM | POA: Diagnosis present

## 2016-01-22 DIAGNOSIS — R109 Unspecified abdominal pain: Secondary | ICD-10-CM | POA: Diagnosis not present

## 2016-01-22 DIAGNOSIS — M549 Dorsalgia, unspecified: Secondary | ICD-10-CM | POA: Diagnosis present

## 2016-01-22 DIAGNOSIS — Z6823 Body mass index (BMI) 23.0-23.9, adult: Secondary | ICD-10-CM | POA: Diagnosis not present

## 2016-01-22 DIAGNOSIS — R200 Anesthesia of skin: Secondary | ICD-10-CM | POA: Diagnosis not present

## 2016-01-22 DIAGNOSIS — E440 Moderate protein-calorie malnutrition: Secondary | ICD-10-CM | POA: Diagnosis not present

## 2016-01-22 DIAGNOSIS — C7951 Secondary malignant neoplasm of bone: Secondary | ICD-10-CM | POA: Diagnosis not present

## 2016-01-22 DIAGNOSIS — Z809 Family history of malignant neoplasm, unspecified: Secondary | ICD-10-CM | POA: Diagnosis not present

## 2016-01-22 DIAGNOSIS — M6281 Muscle weakness (generalized): Secondary | ICD-10-CM | POA: Diagnosis not present

## 2016-01-22 DIAGNOSIS — R52 Pain, unspecified: Secondary | ICD-10-CM | POA: Diagnosis not present

## 2016-01-22 DIAGNOSIS — I129 Hypertensive chronic kidney disease with stage 1 through stage 4 chronic kidney disease, or unspecified chronic kidney disease: Secondary | ICD-10-CM | POA: Diagnosis not present

## 2016-01-22 DIAGNOSIS — Z87891 Personal history of nicotine dependence: Secondary | ICD-10-CM | POA: Diagnosis not present

## 2016-01-22 MED ORDER — HYDROMORPHONE HCL 2 MG PO TABS: 2.0000 mg | ORAL_TABLET | Freq: Four times a day (QID) | ORAL | 0 refills | Status: AC | PRN

## 2016-01-22 NOTE — Progress Notes (Signed)
PT Cancellation Note  Patient Details Name: Billy Lopez. MRN: [id number] DOB: 11/24/1940   Cancelled Treatment:    Reason Eval/Treat Not Completed: Pain limiting ability to participate (pt reports he wants to try to move but is in too much pain at present. He's had pain medication. Will follow. )   Philomena Doheny 01/22/2016, 12:35 PM [phone]

## 2016-01-22 NOTE — Progress Notes (Addendum)
Patient has chosen [facility], obtained authorization.  Discussed disposition with SNF AC.  RN given Report. Transport for complete. PTAR called for transport.   Kathrin Greathouse, Latanya Presser, MSW Clinical Social Worker 5E and Psychiatric Service Line [phone] 01/22/2016  4:57 PM

## 2016-01-22 NOTE — Progress Notes (Signed)
LCSWA to follow up in am with [employer] corporate in regards to patient motorize wheelchair and transporting it to [facility] SNF. Patient reports he will also call DSS as well. Inform RN.

## 2016-01-22 NOTE — NC FL2 (Signed)
[place] LEVEL OF CARE SCREENING TOOL     IDENTIFICATION  Patient Name: Billy Lopez. Birthdate: 01/17/1941 Sex: male Admission Date (Current Location): 01/16/2016  [facility] and [place] Number:  Herbalist and Address:  [facility],  [place] [address] [place]      Provider Number: [id number]  Attending Physician Name and Address:  Velvet Bathe, MD  Relative Name and Phone Number:       Current Level of Care: Hospital Recommended Level of Care: [place] Prior Approval Number:    Date Approved/Denied:   PASRR Number:    Discharge Plan: SNF    Current Diagnoses: Patient Active Problem List   Diagnosis Date Noted  . Pneumonia 01/17/2016  . Abdominal pain 01/17/2016  . Atypical chest pain 11/28/2015  . Generalized pain   . Palliative care encounter   . CAP (community acquired pneumonia) 11/12/2015  . BRBPR (bright red blood per rectum) 11/12/2015  . Prostate cancer metastatic to bone ([place]) 08/30/2015  . CKD (chronic kidney disease) stage 3, GFR 30-59 ml/min 06/28/2015  . Pseudogout   . Septic joint of left shoulder region ([place]) 06/15/2015  . Arthritis, shoulder region 06/14/2015  . Left shoulder pain 06/14/2015  . Peripheral edema 11/09/2014  . Constipation 11/09/2014  . Periprosthetic fracture around internal prosthetic left hip joint ([place]) 10/04/2014  . Cough 04/19/2014  . Crossover toe 03/31/2014  . Encounter for long-term (current) use of medications 03/30/2014  . Screening examination for venereal disease 03/30/2014  . Cocked-up toe of right foot 10/14/2013  . Arthritis 06/17/2013  . 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 Hematochezia 10/14/2012  . Hx of radiation therapy   . Stage T2a adenocarcinoma of the prostate with a Gleason's score of 3+4 and a PSA of 5.61 10/14/2011  . 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 Chronic pain 07/26/2011  . Preventative health care 07/21/2011  . Heroin withdrawal  ([place]) 07/21/2011  . Allergic rhinitis, cause unspecified 07/21/2011  . Cervical spondylosis 07/21/2011  . Lumbar spondylosis 07/21/2011  . Osteoarthritis 07/21/2011  . COPD (chronic obstructive pulmonary disease) ([place]) 07/21/2011  . Substance abuse 03/22/2011  . ROTATOR CUFF SYNDROME, RIGHT 07/09/2010  . 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 DECREASED HEARING, BILATERAL 09/08/2008  . Human immunodeficiency virus (HIV) disease ([place]) 10/30/2006  . Chronic hepatitis C without hepatic coma ([place]) 10/30/2006  . Essential hypertension 10/30/2006  . LOW BACK PAIN SYNDROME 10/30/2006  . Hepatitis B 10/30/2006    Orientation RESPIRATION BLADDER Height & Weight     Self, Time, Situation, Place  Normal Continent Weight: 144 lb 10 oz (65.6 kg) Height:  5\' 8"  (172.7 cm)  BEHAVIORAL SYMPTOMS/MOOD NEUROLOGICAL BOWEL NUTRITION STATUS      Continent Diet (Fluid consistency Thin)  AMBULATORY STATUS COMMUNICATION OF NEEDS Skin   Limited Assist Verbally Normal                       Personal Care Assistance Level of Assistance  Bathing, Feeding, Dressing Bathing Assistance: Limited assistance Feeding assistance: Independent Dressing Assistance: Limited assistance     Functional Limitations Info  Sight, Hearing, Speech Sight Info: Impaired Hearing Info: Adequate Speech Info: Adequate    SPECIAL CARE FACTORS FREQUENCY  PT (By licensed PT)     PT Frequency: 5              Contractures Contractures Info: Not present    Additional Factors Info  Code Status, Allergies  Code Status Info: Full Allergies Info: Oxycodone           Current Medications (01/22/2016):  This is the current hospital active medication list Current Facility-Administered Medications  Medication Dose Route Frequency Provider Last Rate Last Dose  . acetaminophen (TYLENOL) tablet 650 mg  650 mg Oral Q6H PRN Rise Patience, MD       Or  . acetaminophen (TYLENOL) suppository 650 mg  650 mg Rectal  Q6H PRN Rise Patience, MD      . bisacodyl (DULCOLAX) EC tablet 5 mg  5 mg Oral Daily PRN Rise Patience, MD   5 mg at 01/20/16 1728  . dolutegravir (TIVICAY) tablet 50 mg  50 mg Oral Daily Rise Patience, MD   50 mg at 01/21/16 2150  . emtricitabine-tenofovir AF (DESCOVY) 200-25 MG per tablet 1 tablet  1 tablet Oral QHS Rise Patience, MD   1 tablet at 01/21/16 2150  . enalapril (VASOTEC) tablet 20 mg  20 mg Oral Daily Rise Patience, MD   20 mg at 01/21/16 0910  . enoxaparin (LOVENOX) injection 40 mg  40 mg Subcutaneous Q24H Rise Patience, MD   40 mg at 01/21/16 0910  . feeding supplement (ENSURE ENLIVE) (ENSURE ENLIVE) liquid 237 mL  237 mL Oral BID BM Rise Patience, MD   237 mL at 01/21/16 1400  . furosemide (LASIX) tablet 20 mg  20 mg Oral Daily Rise Patience, MD   20 mg at 01/21/16 0910  . HYDROmorphone (DILAUDID) tablet 4 mg  4 mg Oral Q6H PRN Velvet Bathe, MD   4 mg at 01/22/16 0755  . levofloxacin (LEVAQUIN) tablet 750 mg  750 mg Oral Daily Velvet Bathe, MD   750 mg at 01/21/16 0910  . ondansetron (ZOFRAN) tablet 4 mg  4 mg Oral Q6H PRN Rise Patience, MD       Or  . ondansetron [facility]) injection 4 mg  4 mg Intravenous Q6H PRN Rise Patience, MD      . potassium chloride (K-DUR) CR tablet 10 mEq  10 mEq Oral Daily Rise Patience, MD   10 mEq at 01/21/16 0910  . tamsulosin (FLOMAX) capsule 0.4 mg  0.4 mg Oral BID Rise Patience, MD   0.4 mg at 01/21/16 2150  . traMADol (ULTRAM) tablet 50 mg  50 mg Oral Q6H PRN Rise Patience, MD   50 mg at 01/22/16 0530  . vitamin A capsule 10,000 Units  10,000 Units Oral QPM Rise Patience, MD   10,000 Units at 01/21/16 1744  . vitamin C (ASCORBIC ACID) tablet 500 mg  500 mg Oral QPM Rise Patience, MD   500 mg at 01/21/16 1744     Discharge Medications: Please see discharge summary for a list of discharge medications.  Relevant Imaging Results:  Relevant Lab  Results:   Additional Information ss#[id number]  Lia Hopping, LCSW

## 2016-01-22 NOTE — Clinical Social Work Placement (Signed)
   CLINICAL SOCIAL WORK PLACEMENT  NOTE  Date:  01/22/2016  Patient Details  Name: Billy Lopez. MRN: [id number] Date of Birth: Nov 13, 1940  Clinical Social Work is seeking post-discharge placement for this patient at the [place] level of care (*CSW will initial, date and re-position this form in  chart as items are completed):  Yes   Patient/family provided with [place] Work Department's list of facilities offering this level of care within the geographic area requested by the patient (or if unable, by the patient's family).  Yes   Patient/family informed of their freedom to choose among providers that offer the needed level of care, that participate in Medicare, Medicaid or managed care program needed by the patient, have an available bed and are willing to accept the patient.  Yes   Patient/family informed of [place]'s ownership interest in [facility] and [facility], as well as of the fact that they are under no obligation to receive care at these facilities.  PASRR submitted to EDS on       PASRR number received on       Existing PASRR number confirmed on 01/22/16     FL2 transmitted to all facilities in geographic area requested by pt/family on       FL2 transmitted to all facilities within larger geographic area on       Patient informed that his/her managed care company has contracts with or will negotiate with certain facilities, including the following:  [facility]     Yes   Patient/family informed of bed offers received.  Patient chooses bed at [facility]     Physician recommends and patient chooses bed at      Patient to be transferred to [facility] on 01/22/16.  Patient to be transferred to facility by PTAR     Patient family notified on 01/22/16 of transfer.  Name of family member notified:  Andrena Mews: [phone]     PHYSICIAN       Additional Comment:     _______________________________________________ Lia Hopping, LCSW 01/22/2016, 4:55 PM

## 2016-01-22 NOTE — Progress Notes (Signed)
Pt has wheelchair in room that is unable to be transported by PTAR to rehab facility. CSW is following up w/ pt family/friends and DSS transportation about getting the pt equipment back to pt at rehab facility. CSW plans to follow up tomorrow morning 01/23/2016 when [place] office is open. Kizzie Ide, RN

## 2016-01-22 NOTE — Clinical Social Work Note (Addendum)
Clinical Social Work Assessment  Patient Details  Name: Jatavius Ellenwood. MRN: [id number] Date of Birth: 09/28/1940  Date of referral:  01/22/16               Reason for consult:  Facility Placement                Permission sought to share information with:  Family Supports, Chartered certified accountant granted to share information::     Name::        Agency::     Relationship::     Contact Information:     Housing/Transportation Living arrangements for the past 2 months:  Apartment Source of Information:  Patient, Other (Comment Required) Patient Interpreter Needed:  None Criminal Activity/Legal Involvement Pertinent to Current Situation/Hospitalization:  No - Comment as needed Significant Relationships:  None Lives with:  Self Do you feel safe going back to the place where you live?  Yes Need for family participation in patient care:  Yes (Comment)  Care giving concerns:  Patient recommended 24 hour SNF supervision. Patient reports he lives alone in his apartment, he has family in the area but says they cannot provide 24 hour care. Patient reports he uses his walker to ambulate most of the time, but he also uses motorize wheelchair. . Patient reports he currently feels dysfunctional due to his health, "I feel dysfunctional due to cancer in my bones. I am not prideful I will be glad to go back to SNF." Patient reports he was at a facility two years ago, [facility] and really felt the rehab helped him get stronger.    Social Worker assessment / plan: LCSWA met with patient and community alternative program (CAP) social worker [phone]. She explained she assist patient with home health services in the community. LCSWA discussed patient need for rehab and high level of care. Patient was agreeable. LCSWA provided patient with list of faculties. Patient not accepted to [facility] therefore agreeable to [facility]. LCSWA also explained  patient need for insurance authorization.  Plan: Assist patient with disposition to SNF.   Employment status:  Disabled (Comment on whether or not currently receiving Disability) Insurance information:  Managed Medicare PT Recommendations:  24 Hour Supervision Information / Referral to community resources:  [place]  Patient/Family's Response to care:  Agreeable  Patient/Family's Understanding of and Emotional Response to Diagnosis, Current Treatment, and Prognosis:  "I only shut down for a while it can't keep me down." Patient is well versed with his treatments and medications. Patient is hopeful he well do well at [facility].   Emotional Assessment Appearance:  Appears older than stated age Attitude/Demeanor/Rapport:    Affect (typically observed):  Accepting, Calm, Pleasant, Appropriate, Hopeful Orientation:  Oriented to Self, Oriented to Place, Oriented to  Time, Oriented to Situation Alcohol / Substance use:  Not Applicable Psych involvement (Current and /or in the community):  No (Comment)  Discharge Needs  Concerns to be addressed:  Care Coordination Readmission within the last 30 days:  Yes Current discharge risk:  None Barriers to Discharge:  [place], [place] 01/22/2016, 2:02 PM

## 2016-01-22 NOTE — Discharge Summary (Signed)
Physician Discharge Summary  Billy Lopez. [id number] DOB: 01/22/1941 DOA: 01/16/2016  PCP: Billy Cower, MD  Admit date: 01/16/2016 Discharge date: 01/22/2016  Time spent: > 35 minutes  Recommendations for Outpatient Follow-up:  1. Please continue to ensure patient comfort. Has had chest discomfort due to pna. Pna treated for 7 days and as such will not d/c on more abx at this moment. Should patient develop fevers consider prolonging antibiotic regimen.    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 Shortness of breath is on exertion with some cough. Has had subjective feeling of fever and chills. Had come to the ER last week and was treated for pneumonia as outpatient  Hospital Course:  1. Healthcare associated pneumonia- with chest x-ray showing infiltrates andpatient having pleuritic-type of chest pain patient is being empirically treated for healthcare associated pneumonia. LDH is elevated probably from metastatic prostate cancer. Since last CD4 count is 390 in June 2017 and viral load undetectable patient unlikely to be having PCP pneumonia. Patient is not hypoxic. CT angiogram negative for PE. He completed a 7  day treatment regimen.  2. HIV - being followed by Dr. Linus Lopez. Last CD4 count in June 2017 was 390. Continue antiretrovirals. 3. Hypertension- continue enalapril and Lasix, stable currently 4. COPD- continue home medication regimen. 5. Metastatic prostate cancer- being followed by oncologist. 6. Chest pain- most likely pleuritic secondary to infections etiology  Procedures:  None  Consultations:  None  Discharge Exam: Vitals:   01/22/16 0507 01/22/16 1456  BP: 112/77 103/66  Pulse: 77 87  Resp: 20 18  Temp: 99.3 F (37.4 C) 98.5 F (36.9 C)    General: Pt in nad, alert and awake Cardiovascular: rrr, no rubs Respiratory: no increased wob, no wheezes  Discharge Instructions   Discharge Instructions    Call MD for:  difficulty breathing, headache or visual disturbances    Complete by:  As directed   Call MD for:  temperature >100.4    Complete by:  As directed   Diet - low sodium heart healthy    Complete by:  As directed   Discharge instructions    Complete by:  As directed   Please make sure patient f/u with primary care physician in 1-2 weeks or sooner should any new concern arise.   Increase activity slowly    Complete by:  As directed     Current Discharge Medication List    CONTINUE these medications which have CHANGED   Details  HYDROmorphone (DILAUDID) 2 MG tablet Take 1 tablet (2 mg total) by mouth every 6 (six) hours as needed for severe pain. Qty: 30 tablet, Refills: 0      CONTINUE these medications  which have NOT CHANGED   Details  acetaminophen (TYLENOL) 325 MG tablet Take 2 tablets (650 mg total) by mouth every 6 (six) hours as needed for mild pain (or Fever >/= 101). Qty: 30 tablet, Refills: 0    bisacodyl (DULCOLAX) 5 MG EC tablet Take 1 tablet (5 mg total) by mouth daily as needed for moderate constipation. Qty: 30 tablet, Refills: 0    cholecalciferol (VITAMIN D) 1000 UNITS tablet Take 1,000 Units by mouth daily.     Cyanocobalamin  (VITAMIN B-12 PO) Take 1 tablet by mouth daily.    DESCOVY 200-25 MG tablet TAKE 1 TABLET BY MOUTH AT BEDTIME. Qty: 30 tablet, Refills: 6   Associated Diagnoses: Human immunodeficiency virus (HIV) disease (HCC)    enalapril (VASOTEC) 20 MG tablet Take 20 mg by mouth daily. Refills: 0    feeding supplement, ENSURE COMPLETE, (ENSURE COMPLETE) LIQD Take 237 mLs by mouth 2 (two) times daily between meals. Qty: 474 mL, Refills: 11    furosemide (LASIX) 20 MG tablet Take 20 mg by mouth daily. Refills: 0    potassium chloride (K-DUR) 10 MEQ tablet Take 1 tablet (10 mEq total) by mouth daily. Qty: 90 tablet, Refills: 2    tamsulosin (FLOMAX) 0.4 MG CAPS capsule Take 1 capsule (0.4 mg total) by mouth 2 (two) times daily. Qty: 60 capsule, Refills: 5    TIVICAY 50 MG tablet TAKE 1 TABLET (50 MG TOTAL) BY MOUTH DAILY. Qty: 30 tablet, Refills: 6   Associated Diagnoses: Human immunodeficiency virus (HIV) disease (HCC)    vitamin A 10000 UNIT capsule Take 10,000 Units by mouth every evening.    vitamin C (ASCORBIC ACID) 500 MG tablet Take 500 mg by mouth every evening.      STOP taking these medications     traMADol (ULTRAM) 50 MG tablet        Allergies  Allergen Reactions  . Oxycodone Other (See Comments)    loopiness      The results of significant diagnostics from this hospitalization (including imaging, microbiology, ancillary and laboratory) are listed below for reference.    Significant Diagnostic Studies: Dg Chest 2 View  Result Date: 01/16/2016 CLINICAL DATA:  Subacute onset of generalized chest pain, radiating to the back. Initial encounter. EXAM: CHEST  2 VIEW COMPARISON:  Chest radiograph performed 01/08/2016 FINDINGS: Mild bilateral linear airspace opacities may reflect pneumonia or possibly atelectasis. No pleural effusion or pneumothorax is seen. The heart is normal in size. No acute osseous abnormalities are identified. IMPRESSION: Mild bilateral linear opacities may  reflect pneumonia or possibly atelectasis. Electronically Signed   By: Garald Balding M.D.   On: 01/16/2016 21:06   Dg Chest 2 View  Result Date: 01/08/2016 CLINICAL DATA:  Stabbing bilateral chest pain today. Shortness of breath 2 months. EXAM: CHEST  2 VIEW COMPARISON:  11/28/2015 FINDINGS: The cardiac silhouette, mediastinal and hilar contours are within normal limits and stable. There is tortuosity and calcification of the thoracic aorta. Underlying emphysematous changes and pulmonary scarring. Right lower lobe process appears to be a combination of atelectasis, infiltrate and small effusion. The bony thorax is intact. IMPRESSION: Right lower lobe process likely a combination of infiltrate, atelectasis and effusion. Electronically Signed   By: Marijo Sanes M.D.   On: 01/08/2016 21:02   Dg Lumbar Spine Complete  Result Date: 01/16/2016 CLINICAL DATA:  Subacute onset of lower back pain. Initial encounter. EXAM: LUMBAR SPINE - COMPLETE 4+ VIEW COMPARISON:  CT of the abdomen and pelvis from  04/13/2015 FINDINGS: There is no evidence of fracture or subluxation. Vertebral bodies demonstrate normal height. Degenerative disc space narrowing is noted along the lower lumbar spine, with scattered lateral osteophytes and endplate sclerotic change. Associated facet disease is noted. The visualized bowel gas pattern is unremarkable in appearance; air and stool are noted within the colon. The sacroiliac joints are within normal limits. Bilateral hip arthroplasties are incompletely imaged but appear grossly unremarkable. IMPRESSION: No evidence of fracture or subluxation along the lumbar spine. Mild degenerative change along the lower lumbar spine. Electronically Signed   By: Garald Balding M.D.   On: 01/16/2016 21:22   Ct Angio Chest Pe W Or Wo Contrast  Result Date: 01/17/2016 CLINICAL DATA:  Chest pain and shortness of Breath EXAM: CT ANGIOGRAPHY CHEST WITH CONTRAST TECHNIQUE: Multidetector CT imaging of the chest  was performed using the standard protocol during bolus administration of intravenous contrast. Multiplanar CT image reconstructions and MIPs were obtained to evaluate the vascular anatomy. CONTRAST:  100 mL Isovue 370 COMPARISON:  11/12/2015 FINDINGS: Mediastinum/Lymph Nodes: The thoracic inlet is within normal limits. No hilar or mediastinal adenopathy is identified. Cardiovascular: The thoracic aorta shows no evidence of aneurysmal dilatation or dissection. The pulmonary artery as visualized is somewhat limited by patient motion artifact although no gross central pulmonary embolus is noted. Lungs/Pleura: Again limited by patient respiratory artifact. Mild emphysematous changes are seen. Right lower lobe consolidation with associated small effusion is noted. No definitive parenchymal nodule are pneumothorax is seen. Upper abdomen: No acute findings. Musculoskeletal: Multiple lytic and sclerotic lesions are noted throughout the bony structures consistent with the given clinical history of metastatic disease. These appear slightly improved when compared with the prior exam consistent with therapy Review of the MIP images confirms the above findings. IMPRESSION: No evidence of pulmonary emboli although the examination is somewhat limited by patient motion artifact. Right lower lobe infiltrate with associated small effusion. Changes of metastatic disease to the bones with some improvement when compared with the prior exam consistent with therapy. Electronically Signed   By: Inez Catalina M.D.   On: 01/17/2016 11:46    Microbiology: No results found for this or any previous visit (from the past 240 hour(s)).   Labs: Basic Metabolic Panel:  Recent Labs Lab 01/16/16 2050 01/17/16 0600 01/19/16 0508  NA 135 135 135  K 3.8 3.6 3.9  CL 106 106 109  CO2 21* 21* 20*  GLUCOSE 87 110* 103*  BUN 15 17 13   CREATININE 1.06 1.07 0.95  CALCIUM 9.9 9.1 9.1   Liver Function Tests:  Recent Labs Lab  01/16/16 2050  AST 42*  ALT 15*  ALKPHOS 2,448*  BILITOT 0.9  PROT 8.4*  ALBUMIN 4.6    Recent Labs Lab 01/16/16 2050  LIPASE 26   No results for input(s): AMMONIA in the last 168 hours. CBC:  Recent Labs Lab 01/16/16 2050 01/17/16 0600 01/19/16 0508  WBC 5.9 5.2 5.6  NEUTROABS 4.0  --   --   HGB 14.1 12.1* 11.7*  HCT 42.9 36.9* 34.8*  MCV 81.4 81.6 79.1  PLT 196 168 147*   Cardiac Enzymes:  Recent Labs Lab 01/16/16 2314 01/17/16 0600 01/17/16 1243 01/17/16 1819  TROPONINI <0.03 0.03* <0.03 0.03*   BNP: BNP (last 3 results)  Recent Labs  01/16/16 2314  BNP 139.7*    ProBNP (last 3 results) No results for input(s): PROBNP in the last 8760 hours.  CBG: No results for input(s): GLUCAP in the last 168 hours.  Signed:  Velvet Bathe MD.  [facility] 01/22/2016, 4:33 PM

## 2016-01-23 ENCOUNTER — Telehealth: Payer: Self-pay | Admitting: *Deleted

## 2016-01-23 NOTE — Telephone Encounter (Signed)
Called pt to set him up for hosp f/u appt. Pt states they sent him to [facility] because he does not have any mobility . He is not sure how long he is going to be there...Johny Chess

## 2016-01-24 DIAGNOSIS — M6281 Muscle weakness (generalized): Secondary | ICD-10-CM | POA: Diagnosis not present

## 2016-01-24 DIAGNOSIS — R5381 Other malaise: Secondary | ICD-10-CM | POA: Diagnosis not present

## 2016-01-24 DIAGNOSIS — G8929 Other chronic pain: Secondary | ICD-10-CM | POA: Diagnosis not present

## 2016-01-24 DIAGNOSIS — R2689 Other abnormalities of gait and mobility: Secondary | ICD-10-CM | POA: Diagnosis not present

## 2016-01-24 NOTE — Progress Notes (Addendum)
LCSWA spoke with Eliseo Gum at [facility]. A facility representative will help pick up patient chair today. LCSWA to help coordinate.    Kathrin Greathouse, Latanya Presser, MSW Clinical Social Worker 5E and Psychiatric Service Line [phone] 01/24/2016  10:31 AM

## 2016-01-29 DIAGNOSIS — R5381 Other malaise: Secondary | ICD-10-CM | POA: Diagnosis not present

## 2016-01-29 DIAGNOSIS — R2689 Other abnormalities of gait and mobility: Secondary | ICD-10-CM | POA: Diagnosis not present

## 2016-01-29 DIAGNOSIS — G8929 Other chronic pain: Secondary | ICD-10-CM | POA: Diagnosis not present

## 2016-01-29 DIAGNOSIS — M6281 Muscle weakness (generalized): Secondary | ICD-10-CM | POA: Diagnosis not present

## 2016-01-30 DIAGNOSIS — C7951 Secondary malignant neoplasm of bone: Secondary | ICD-10-CM | POA: Diagnosis not present

## 2016-01-30 DIAGNOSIS — C61 Malignant neoplasm of prostate: Secondary | ICD-10-CM | POA: Diagnosis not present

## 2016-01-30 DIAGNOSIS — R52 Pain, unspecified: Secondary | ICD-10-CM | POA: Diagnosis not present

## 2016-01-30 DIAGNOSIS — R109 Unspecified abdominal pain: Secondary | ICD-10-CM | POA: Diagnosis not present

## 2016-01-31 DIAGNOSIS — M5415 Radiculopathy, thoracolumbar region: Secondary | ICD-10-CM | POA: Diagnosis not present

## 2016-02-01 ENCOUNTER — Encounter (HOSPITAL_COMMUNITY): Payer: Self-pay | Admitting: Internal Medicine

## 2016-02-01 ENCOUNTER — Emergency Department (HOSPITAL_COMMUNITY): Payer: Commercial Managed Care - HMO

## 2016-02-01 ENCOUNTER — Inpatient Hospital Stay (HOSPITAL_COMMUNITY)
Admission: EM | Admit: 2016-02-01 | Discharge: 2016-02-06 | DRG: 947 | Disposition: A | Discharge status: Skilled Nursing Facility | Payer: Commercial Managed Care - HMO | Attending: Internal Medicine | Admitting: Internal Medicine

## 2016-02-01 DIAGNOSIS — H9192 Unspecified hearing loss, left ear: Secondary | ICD-10-CM | POA: Diagnosis present

## 2016-02-01 DIAGNOSIS — Z923 Personal history of irradiation: Secondary | ICD-10-CM | POA: Diagnosis not present

## 2016-02-01 DIAGNOSIS — Z8249 Family history of ischemic heart disease and other diseases of the circulatory system: Secondary | ICD-10-CM

## 2016-02-01 DIAGNOSIS — C7951 Secondary malignant neoplasm of bone: Secondary | ICD-10-CM | POA: Diagnosis not present

## 2016-02-01 DIAGNOSIS — C61 Malignant neoplasm of prostate: Secondary | ICD-10-CM | POA: Diagnosis present

## 2016-02-01 DIAGNOSIS — G8220 Paraplegia, unspecified: Secondary | ICD-10-CM | POA: Diagnosis present

## 2016-02-01 DIAGNOSIS — R200 Anesthesia of skin: Secondary | ICD-10-CM | POA: Diagnosis not present

## 2016-02-01 DIAGNOSIS — Z6823 Body mass index (BMI) 23.0-23.9, adult: Secondary | ICD-10-CM

## 2016-02-01 DIAGNOSIS — M8448XA Pathological fracture, other site, initial encounter for fracture: Secondary | ICD-10-CM | POA: Diagnosis present

## 2016-02-01 DIAGNOSIS — Z8701 Personal history of pneumonia (recurrent): Secondary | ICD-10-CM | POA: Diagnosis not present

## 2016-02-01 DIAGNOSIS — B182 Chronic viral hepatitis C: Secondary | ICD-10-CM | POA: Diagnosis present

## 2016-02-01 DIAGNOSIS — K5900 Constipation, unspecified: Secondary | ICD-10-CM | POA: Diagnosis present

## 2016-02-01 DIAGNOSIS — G9529 Other cord compression: Secondary | ICD-10-CM | POA: Diagnosis not present

## 2016-02-01 DIAGNOSIS — K922 Gastrointestinal hemorrhage, unspecified: Secondary | ICD-10-CM

## 2016-02-01 DIAGNOSIS — D649 Anemia, unspecified: Secondary | ICD-10-CM | POA: Diagnosis not present

## 2016-02-01 DIAGNOSIS — N183 Chronic kidney disease, stage 3 (moderate): Secondary | ICD-10-CM | POA: Diagnosis not present

## 2016-02-01 DIAGNOSIS — J449 Chronic obstructive pulmonary disease, unspecified: Secondary | ICD-10-CM | POA: Diagnosis not present

## 2016-02-01 DIAGNOSIS — I129 Hypertensive chronic kidney disease with stage 1 through stage 4 chronic kidney disease, or unspecified chronic kidney disease: Secondary | ICD-10-CM | POA: Diagnosis present

## 2016-02-01 DIAGNOSIS — G893 Neoplasm related pain (acute) (chronic): Principal | ICD-10-CM | POA: Diagnosis present

## 2016-02-01 DIAGNOSIS — R41841 Cognitive communication deficit: Secondary | ICD-10-CM | POA: Diagnosis not present

## 2016-02-01 DIAGNOSIS — B20 Human immunodeficiency virus [HIV] disease: Secondary | ICD-10-CM | POA: Diagnosis present

## 2016-02-01 DIAGNOSIS — F19129 Other psychoactive substance abuse with intoxication, unspecified: Secondary | ICD-10-CM | POA: Diagnosis not present

## 2016-02-01 DIAGNOSIS — Z885 Allergy status to narcotic agent status: Secondary | ICD-10-CM

## 2016-02-01 DIAGNOSIS — Z809 Family history of malignant neoplasm, unspecified: Secondary | ICD-10-CM | POA: Diagnosis not present

## 2016-02-01 DIAGNOSIS — E46 Unspecified protein-calorie malnutrition: Secondary | ICD-10-CM | POA: Diagnosis not present

## 2016-02-01 DIAGNOSIS — Z79899 Other long term (current) drug therapy: Secondary | ICD-10-CM

## 2016-02-01 DIAGNOSIS — K649 Unspecified hemorrhoids: Secondary | ICD-10-CM | POA: Diagnosis present

## 2016-02-01 DIAGNOSIS — K921 Melena: Secondary | ICD-10-CM | POA: Diagnosis present

## 2016-02-01 DIAGNOSIS — R52 Pain, unspecified: Secondary | ICD-10-CM | POA: Diagnosis not present

## 2016-02-01 DIAGNOSIS — R29898 Other symptoms and signs involving the musculoskeletal system: Secondary | ICD-10-CM | POA: Diagnosis not present

## 2016-02-01 DIAGNOSIS — E440 Moderate protein-calorie malnutrition: Secondary | ICD-10-CM | POA: Diagnosis not present

## 2016-02-01 DIAGNOSIS — R2689 Other abnormalities of gait and mobility: Secondary | ICD-10-CM | POA: Diagnosis not present

## 2016-02-01 DIAGNOSIS — Z96643 Presence of artificial hip joint, bilateral: Secondary | ICD-10-CM | POA: Diagnosis present

## 2016-02-01 DIAGNOSIS — Z87891 Personal history of nicotine dependence: Secondary | ICD-10-CM | POA: Diagnosis not present

## 2016-02-01 DIAGNOSIS — M549 Dorsalgia, unspecified: Secondary | ICD-10-CM | POA: Diagnosis not present

## 2016-02-01 DIAGNOSIS — R1312 Dysphagia, oropharyngeal phase: Secondary | ICD-10-CM | POA: Diagnosis not present

## 2016-02-01 DIAGNOSIS — G8929 Other chronic pain: Secondary | ICD-10-CM | POA: Diagnosis not present

## 2016-02-01 DIAGNOSIS — R531 Weakness: Secondary | ICD-10-CM

## 2016-02-01 DIAGNOSIS — R5381 Other malaise: Secondary | ICD-10-CM | POA: Diagnosis not present

## 2016-02-01 DIAGNOSIS — M6281 Muscle weakness (generalized): Secondary | ICD-10-CM | POA: Diagnosis not present

## 2016-02-01 DIAGNOSIS — I6789 Other cerebrovascular disease: Secondary | ICD-10-CM | POA: Diagnosis not present

## 2016-02-01 DIAGNOSIS — K219 Gastro-esophageal reflux disease without esophagitis: Secondary | ICD-10-CM | POA: Diagnosis present

## 2016-02-01 DIAGNOSIS — G9520 Unspecified cord compression: Secondary | ICD-10-CM | POA: Diagnosis present

## 2016-02-01 DIAGNOSIS — I10 Essential (primary) hypertension: Secondary | ICD-10-CM | POA: Diagnosis present

## 2016-02-01 DIAGNOSIS — D631 Anemia in chronic kidney disease: Secondary | ICD-10-CM | POA: Diagnosis not present

## 2016-02-01 LAB — COMPREHENSIVE METABOLIC PANEL
ALK PHOS: 1719 U/L — AB (ref 38–126)
ALT: 16 U/L — AB (ref 17–63)
ANION GAP: 5 (ref 5–15)
AST: 60 U/L — ABNORMAL HIGH (ref 15–41)
Albumin: 3.2 g/dL — ABNORMAL LOW (ref 3.5–5.0)
BILIRUBIN TOTAL: 0.3 mg/dL (ref 0.3–1.2)
BUN: 35 mg/dL — ABNORMAL HIGH (ref 6–20)
CALCIUM: 8.6 mg/dL — AB (ref 8.9–10.3)
CO2: 21 mmol/L — AB (ref 22–32)
CREATININE: 1.03 mg/dL (ref 0.61–1.24)
Chloride: 108 mmol/L (ref 101–111)
Glucose, Bld: 107 mg/dL — ABNORMAL HIGH (ref 65–99)
Potassium: 3.8 mmol/L (ref 3.5–5.1)
Sodium: 134 mmol/L — ABNORMAL LOW (ref 135–145)
TOTAL PROTEIN: 6.2 g/dL — AB (ref 6.5–8.1)

## 2016-02-01 LAB — CBC WITH DIFFERENTIAL/PLATELET
Basophils Absolute: 0 10*3/uL (ref 0.0–0.1)
Basophils Relative: 1 %
Eosinophils Absolute: 0.1 10*3/uL (ref 0.0–0.7)
Eosinophils Relative: 2 %
HEMATOCRIT: 32.7 % — AB (ref 39.0–52.0)
HEMOGLOBIN: 10.5 g/dL — AB (ref 13.0–17.0)
LYMPHS ABS: 1 10*3/uL (ref 0.7–4.0)
LYMPHS PCT: 18 %
MCH: 27 pg (ref 26.0–34.0)
MCHC: 32.1 g/dL (ref 30.0–36.0)
MCV: 84.1 fL (ref 78.0–100.0)
MONOS PCT: 14 %
Monocytes Absolute: 0.8 10*3/uL (ref 0.1–1.0)
NEUTROS PCT: 65 %
Neutro Abs: 3.8 10*3/uL (ref 1.7–7.7)
Platelets: 218 10*3/uL (ref 150–400)
RBC: 3.89 MIL/uL — AB (ref 4.22–5.81)
RDW: 16.2 % — ABNORMAL HIGH (ref 11.5–15.5)
WBC: 5.7 10*3/uL (ref 4.0–10.5)

## 2016-02-01 LAB — CBC
HEMATOCRIT: 33.2 % — AB (ref 39.0–52.0)
HEMOGLOBIN: 10.7 g/dL — AB (ref 13.0–17.0)
MCH: 26.4 pg (ref 26.0–34.0)
MCHC: 32.2 g/dL (ref 30.0–36.0)
MCV: 81.8 fL (ref 78.0–100.0)
Platelets: 212 10*3/uL (ref 150–400)
RBC: 4.06 MIL/uL — ABNORMAL LOW (ref 4.22–5.81)
RDW: 16 % — ABNORMAL HIGH (ref 11.5–15.5)
WBC: 6.5 10*3/uL (ref 4.0–10.5)

## 2016-02-01 LAB — TROPONIN I

## 2016-02-01 LAB — POC OCCULT BLOOD, ED: FECAL OCCULT BLD: POSITIVE — AB

## 2016-02-01 MED ORDER — GADOBENATE DIMEGLUMINE 529 MG/ML IV SOLN: 15.0000 mL | Freq: Once | INTRAVENOUS | Status: DC | PRN

## 2016-02-01 MED ORDER — ONDANSETRON HCL 4 MG PO TABS: 4.0000 mg | ORAL_TABLET | Freq: Four times a day (QID) | ORAL | Status: DC | PRN

## 2016-02-01 MED ORDER — MORPHINE SULFATE (PF) 4 MG/ML IV SOLN
4.0000 mg | Freq: Once | INTRAVENOUS | Status: AC
  Administered 2016-02-01: 4 mg via INTRAVENOUS
  Filled 2016-02-01: qty 1

## 2016-02-01 MED ORDER — VITAMIN C 500 MG PO TABS
500.0000 mg | ORAL_TABLET | Freq: Every evening | ORAL | Status: DC
  Administered 2016-02-02 – 2016-02-03 (×2): 500 mg via ORAL
  Filled 2016-02-01 (×2): qty 1

## 2016-02-01 MED ORDER — VITAMIN D3 25 MCG (1000 UNIT) PO TABS
1000.0000 [IU] | ORAL_TABLET | Freq: Every day | ORAL | Status: DC
  Administered 2016-02-02 – 2016-02-06 (×5): 1000 [IU] via ORAL
  Filled 2016-02-01 (×5): qty 1

## 2016-02-01 MED ORDER — DOLUTEGRAVIR SODIUM 50 MG PO TABS
50.0000 mg | ORAL_TABLET | Freq: Every day | ORAL | Status: DC
Start: 2016-02-02 — End: 2016-02-06
  Administered 2016-02-01 – 2016-02-05 (×5): 50 mg via ORAL
  Filled 2016-02-01 (×6): qty 1

## 2016-02-01 MED ORDER — SODIUM CHLORIDE 0.9 % IV BOLUS (SEPSIS)
1000.0000 mL | Freq: Once | INTRAVENOUS | Status: AC
  Administered 2016-02-01: 1000 mL via INTRAVENOUS

## 2016-02-01 MED ORDER — SENNOSIDES-DOCUSATE SODIUM 8.6-50 MG PO TABS
1.0000 | ORAL_TABLET | Freq: Every day | ORAL | Status: DC
  Administered 2016-02-02: 1 via ORAL
  Filled 2016-02-01: qty 1

## 2016-02-01 MED ORDER — MILK AND MOLASSES ENEMA
1.0000 | Freq: Once | RECTAL | Status: DC
  Filled 2016-02-01: qty 250

## 2016-02-01 MED ORDER — HYDROMORPHONE HCL 1 MG/ML IJ SOLN
1.0000 mg | INTRAMUSCULAR | Status: DC | PRN
  Administered 2016-02-02 (×4): 1 mg via INTRAVENOUS
  Filled 2016-02-01 (×4): qty 1

## 2016-02-01 MED ORDER — SODIUM CHLORIDE 0.9 % IV SOLN
INTRAVENOUS | Status: AC
  Administered 2016-02-01 – 2016-02-02 (×2): via INTRAVENOUS

## 2016-02-01 MED ORDER — ENSURE ENLIVE PO LIQD
237.0000 mL | Freq: Two times a day (BID) | ORAL | Status: DC
  Administered 2016-02-03 – 2016-02-06 (×7): 237 mL via ORAL

## 2016-02-01 MED ORDER — HYDROMORPHONE HCL 1 MG/ML IJ SOLN
1.0000 mg | Freq: Once | INTRAMUSCULAR | Status: AC
  Administered 2016-02-01: 1 mg via INTRAVENOUS
  Filled 2016-02-01: qty 1

## 2016-02-01 MED ORDER — HYDROCODONE-ACETAMINOPHEN 5-325 MG PO TABS
1.0000 | ORAL_TABLET | Freq: Four times a day (QID) | ORAL | Status: DC | PRN
  Administered 2016-02-02: 1 via ORAL
  Filled 2016-02-01: qty 1

## 2016-02-01 MED ORDER — GADOBENATE DIMEGLUMINE 529 MG/ML IV SOLN
13.0000 mL | Freq: Once | INTRAVENOUS | Status: AC | PRN
  Administered 2016-02-01: 13 mL via INTRAVENOUS

## 2016-02-01 MED ORDER — EMTRICITABINE-TENOFOVIR AF 200-25 MG PO TABS
1.0000 | ORAL_TABLET | Freq: Every day | ORAL | Status: DC
  Administered 2016-02-01 – 2016-02-05 (×5): 1 via ORAL
  Filled 2016-02-01 (×7): qty 1

## 2016-02-01 MED ORDER — ENALAPRIL MALEATE 20 MG PO TABS
20.0000 mg | ORAL_TABLET | Freq: Every day | ORAL | Status: DC
  Administered 2016-02-02 – 2016-02-06 (×5): 20 mg via ORAL
  Filled 2016-02-01 (×5): qty 1

## 2016-02-01 MED ORDER — ONDANSETRON HCL 4 MG/2ML IJ SOLN: 4.0000 mg | Freq: Four times a day (QID) | INTRAMUSCULAR | Status: DC | PRN

## 2016-02-01 MED ORDER — VITAMIN A 10000 UNITS PO CAPS
10000.0000 [IU] | ORAL_CAPSULE | Freq: Every evening | ORAL | Status: DC
  Administered 2016-02-01 – 2016-02-03 (×3): 10000 [IU] via ORAL
  Filled 2016-02-01 (×3): qty 1

## 2016-02-01 MED ORDER — BISACODYL 5 MG PO TBEC: 5.0000 mg | DELAYED_RELEASE_TABLET | Freq: Every day | ORAL | Status: DC | PRN

## 2016-02-01 MED ORDER — MAGNESIUM HYDROXIDE 400 MG/5ML PO SUSP: 30.0000 mL | Freq: Every day | ORAL | Status: DC | PRN

## 2016-02-01 MED ORDER — ACETAMINOPHEN 650 MG RE SUPP: 650.0000 mg | Freq: Four times a day (QID) | RECTAL | Status: DC | PRN

## 2016-02-01 MED ORDER — FENTANYL 50 MCG/HR TD PT72
50.0000 ug | MEDICATED_PATCH | TRANSDERMAL | Status: DC
  Administered 2016-02-04: 50 ug via TRANSDERMAL
  Filled 2016-02-01 (×2): qty 1

## 2016-02-01 MED ORDER — POTASSIUM CHLORIDE CRYS ER 10 MEQ PO TBCR
10.0000 meq | EXTENDED_RELEASE_TABLET | Freq: Every day | ORAL | Status: DC
  Administered 2016-02-02 – 2016-02-06 (×5): 10 meq via ORAL
  Filled 2016-02-01 (×8): qty 1

## 2016-02-01 MED ORDER — ONDANSETRON HCL 4 MG/2ML IJ SOLN
4.0000 mg | Freq: Once | INTRAMUSCULAR | Status: AC
  Administered 2016-02-01: 4 mg via INTRAVENOUS
  Filled 2016-02-01: qty 2

## 2016-02-01 MED ORDER — FUROSEMIDE 20 MG PO TABS
20.0000 mg | ORAL_TABLET | Freq: Every day | ORAL | Status: DC
  Administered 2016-02-02 – 2016-02-06 (×5): 20 mg via ORAL
  Filled 2016-02-01 (×5): qty 1

## 2016-02-01 MED ORDER — BISACODYL 10 MG RE SUPP: 10.0000 mg | Freq: Every day | RECTAL | Status: DC | PRN

## 2016-02-01 MED ORDER — ACETAMINOPHEN 325 MG PO TABS: 650.0000 mg | ORAL_TABLET | Freq: Four times a day (QID) | ORAL | Status: DC | PRN

## 2016-02-01 NOTE — ED Notes (Signed)
Pt returned from MRI °

## 2016-02-01 NOTE — ED Notes (Signed)
Dr. Gilford Raid informed pt requesting something to eat.

## 2016-02-01 NOTE — ED Notes (Signed)
Pt

## 2016-02-01 NOTE — ED Triage Notes (Signed)
Per EMS- Patient is a resident of [place]. Patient has a history of Bone Cancer and c/o pain all over. Initial BP 78/46. Patient received 500 ml NS prior to arrival to the ED. BP increased to 109/76. Patient also c/o of decreased sensation to lower extremities and decreased movement.

## 2016-02-01 NOTE — ED Provider Notes (Signed)
[facility] DEPT Provider Note   CSN: [id number] Arrival date & time: 02/01/16  1446     History   Chief Complaint Chief Complaint  Patient presents with  . pain control    HPI Billy Lopez. is a 75 y.o. male.  Pt has a hx of metastatic prostate cancer (followed by Dr. Alen Blew) who presents today with weakness and numbness in his legs.  He also c/o constipation.  The pt said that until about 1 week ago, the was able to walk with a walker and transfer to a wheelchair.  He is no longer able to do that.  He said that he had PT today and it hurt in his back when the therapist tried to move his legs.  The pt was admittedfrom 8/15 to 8/21 with pna and he said that he was able to use the bedside commode then.  The pt said that if he tries to use the bedside commode, then he can't stay up and can't feel the floor.  Currently, pt lives in an apartment.      Past Medical History:  Diagnosis Date  . Allergic rhinitis, cause unspecified 07/21/2011  . Cervical spondylosis 07/21/2011  . CKD (chronic kidney disease) stage 3, GFR 30-59 ml/min 06/28/2015  . COPD (chronic obstructive pulmonary disease) ([place]) 07/21/2011  . GERD (gastroesophageal reflux disease)   . H/O blood clots   . 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 Hepatitis    Hep C  . Heroin withdrawal ([place]) 07/21/2011  . HIV infection ([place])   . Hx of radiation therapy 12/17/11 - 02/13/12   prostate  . Hypertension   . Lumbar spondylosis 07/21/2011  . Osteoarthritis 07/21/2011  . Pneumonia   . Prostate cancer ([place]) 09/19/11   gleason 7  . Prostate cancer metastatic to bone ([place]) 08/30/2015  . Rectal bleeding   . S/P radiation therapy 12/17/2011 through 02/13/2012   78 gray was delivered to the prostate in 40 fractions of 1.95 gray  . Septic joint of left shoulder region ([place]) 06/15/2015  . Substance abuse   . Wears dentures    top  . Wears glasses     Patient Active Problem List   Diagnosis Date Noted  . Intractable pain  02/01/2016  . Pneumonia 01/17/2016  . Abdominal pain 01/17/2016  . Atypical chest pain 11/28/2015  . Generalized pain   . Palliative care encounter   . CAP (community acquired pneumonia) 11/12/2015  . BRBPR (bright red blood per rectum) 11/12/2015  . Prostate cancer metastatic to bone ([place]) 08/30/2015  . CKD (chronic kidney disease) stage 3, GFR 30-59 ml/min 06/28/2015  . Pseudogout   . Septic joint of left shoulder region ([place]) 06/15/2015  . Arthritis, shoulder region 06/14/2015  . Left shoulder pain 06/14/2015  . Peripheral edema 11/09/2014  . Constipation 11/09/2014  . Periprosthetic fracture around internal prosthetic left hip joint ([place]) 10/04/2014  . Cough 04/19/2014  . Crossover toe 03/31/2014  . Encounter for long-term (current) use of medications 03/30/2014  . Screening examination for venereal disease 03/30/2014  . Cocked-up toe of right foot 10/14/2013  . Arthritis 06/17/2013  . 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 Hematochezia 10/14/2012  . Hx of radiation therapy   . Stage T2a adenocarcinoma of the prostate with a Gleason's score of 3+4 and a PSA of 5.61 10/14/2011  . 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 Chronic pain 07/26/2011  . Preventative health care 07/21/2011  . 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 Allergic  rhinitis, cause unspecified 07/21/2011  . Cervical spondylosis 07/21/2011  . Lumbar spondylosis 07/21/2011  . Osteoarthritis 07/21/2011  . COPD (chronic obstructive pulmonary disease) ([place]) 07/21/2011  . Substance abuse 03/22/2011  . ROTATOR CUFF SYNDROME, RIGHT 07/09/2010  . 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 DECREASED HEARING, BILATERAL 09/08/2008  . Human immunodeficiency virus (HIV) disease ([place]) 10/30/2006  . Chronic hepatitis C without hepatic coma ([place]) 10/30/2006  . Essential hypertension 10/30/2006  . LOW BACK PAIN SYNDROME 10/30/2006  . Hepatitis B 10/30/2006    Past Surgical History:  Procedure Laterality Date  . AMPUTATION Right  03/31/2014   Procedure: RIGHT SECOND TOE AMPUTATION ;  Surgeon: Wylene Simmer, MD;  Location: [facility];  Service: Orthopedics;  Laterality: Right;  . COLONOSCOPY N/A 11/17/2012   Procedure: COLONOSCOPY;  Surgeon: Ladene Artist, MD;  Location: [facility];  Service: Endoscopy;  Laterality: N/A;  possible APC  . FOOT SURGERY     bilat. ingrown toenails removed  . HAND SURGERY     right thumb  . HERNIA REPAIR    . HIP SURGERY     total bilateral replacement  . SHOULDER ARTHROSCOPY Left 06/15/2015   Procedure: ARTHROSCOPIC SHOULDER IRRIGATION AND DEBRIDEMENT;  Surgeon: Marchia Bond, MD;  Location: [facility];  Service: Orthopedics;  Laterality: Left;  . TOTAL HIP REVISION Left 10/08/2014   Procedure: POSTERIOR LEFT TOTAL HIP REVISION;  Surgeon: Paralee Cancel, MD;  Location: [facility];  Service: Orthopedics;  Laterality: Left;       Home Medications    Prior to Admission medications   Medication Sig Start Date End Date Taking? Authorizing Provider  acetaminophen (TYLENOL) 325 MG tablet Take 2 tablets (650 mg total) by mouth every 6 (six) hours as needed for mild pain (or Fever >/= 101). 10/13/14   Robbie Lis, MD  bisacodyl (DULCOLAX) 5 MG EC tablet Take 1 tablet (5 mg total) by mouth daily as needed for moderate constipation. 10/13/14   Robbie Lis, MD  cholecalciferol (VITAMIN D) 1000 UNITS tablet Take 1,000 Units by mouth daily.     Historical Provider, MD  Cyanocobalamin (VITAMIN B-12 PO) Take 1 tablet by mouth daily.    Historical Provider, MD  DESCOVY 200-25 MG tablet TAKE 1 TABLET BY MOUTH AT BEDTIME. 12/13/15   Thayer Headings, MD  enalapril (VASOTEC) 20 MG tablet Take 20 mg by mouth daily. 11/06/15   Historical Provider, MD  feeding supplement, ENSURE COMPLETE, (ENSURE COMPLETE) LIQD Take 237 mLs by mouth 2 (two) times daily between meals. 04/12/15   Biagio Borg, MD  furosemide (LASIX) 20 MG tablet Take 20 mg by mouth daily. 11/08/15   Historical Provider, MD  HYDROmorphone  (DILAUDID) 2 MG tablet Take 1 tablet (2 mg total) by mouth every 6 (six) hours as needed for severe pain. 01/22/16   Velvet Bathe, MD  potassium chloride (K-DUR) 10 MEQ tablet Take 1 tablet (10 mEq total) by mouth daily. 12/07/15   Biagio Borg, MD  tamsulosin (FLOMAX) 0.4 MG CAPS capsule Take 1 capsule (0.4 mg total) by mouth 2 (two) times daily. 12/07/15   Biagio Borg, MD  TIVICAY 50 MG tablet TAKE 1 TABLET (50 MG TOTAL) BY MOUTH DAILY. 12/13/15   Thayer Headings, MD  vitamin A 10000 UNIT capsule Take 10,000 Units by mouth every evening.    Historical Provider, MD  vitamin C (ASCORBIC ACID) 500 MG tablet Take 500 mg by mouth every evening.    Historical Provider, MD  Family History Family History  Problem Relation Age of Onset  . Cancer Mother     unsure of type  . Alcohol abuse Other   . Arthritis Other   . Hypertension Other   . Colon cancer Neg Hx   . Diabetes Neg Hx     Social History Social History  Substance Use Topics  . Smoking status: Former Smoker    Packs/day: 1.00    Years: 10.00    Types: Cigarettes    Quit date: 10/13/1960  . Smokeless tobacco: Never Used  . Alcohol use No     Allergies   Oxycodone   Review of Systems Review of Systems  Gastrointestinal: Positive for constipation.  Musculoskeletal: Positive for back pain and gait problem.  All other systems reviewed and are negative.    Physical Exam Updated Vital Signs BP 122/86   Pulse 78   Temp 98.4 F (36.9 C) (Oral)   Resp 16   SpO2 91%   Physical Exam  Constitutional: He is oriented to person, place, and time. He appears well-developed and well-nourished.  HENT:  Head: Normocephalic and atraumatic.  Right Ear: External ear normal.  Left Ear: External ear normal.  Nose: Nose normal.  Mouth/Throat: Oropharynx is clear and moist.  Eyes: Conjunctivae and EOM are normal. Pupils are equal, round, and reactive to light.  Neck: Normal range of motion. Neck supple.  Cardiovascular: Normal rate,  regular rhythm, normal heart sounds and intact distal pulses.   Pulmonary/Chest: Effort normal and breath sounds normal.  Abdominal: He exhibits distension. Bowel sounds are decreased. There is generalized tenderness.  Neurological: He is alert and oriented to person, place, and time.  Pt unable to move his legs.  He also reports that he can't feel his legs.  Skin: Skin is warm and dry.  Psychiatric: He has a normal mood and affect. His behavior is normal. Judgment and thought content normal.  Nursing note and vitals reviewed.    ED Treatments / Results  Labs (all labs ordered are listed, but only abnormal results are displayed) Labs Reviewed  CBC WITH DIFFERENTIAL/PLATELET - Abnormal; Notable for the following:       Result Value   RBC 3.89 (*)    Hemoglobin 10.5 (*)    HCT 32.7 (*)    RDW 16.2 (*)    All other components within normal limits  COMPREHENSIVE METABOLIC PANEL - Abnormal; Notable for the following:    Sodium 134 (*)    CO2 21 (*)    Glucose, Bld 107 (*)    BUN 35 (*)    Calcium 8.6 (*)    Total Protein 6.2 (*)    Albumin 3.2 (*)    AST 60 (*)    ALT 16 (*)    Alkaline Phosphatase 1,719 (*)    All other components within normal limits  POC OCCULT BLOOD, ED - Abnormal; Notable for the following:    Fecal Occult Bld POSITIVE (*)    All other components within normal limits  TROPONIN I  URINALYSIS, ROUTINE W REFLEX MICROSCOPIC (NOT AT [facility])    EKG  EKG Interpretation  Date/Time:  Thursday February 01 2016 16:58:33 EDT Ventricular Rate:  76 PR Interval:    QRS Duration: 84 QT Interval:  457 QTC Calculation: 514 R Axis:   36 Text Interpretation:  Sinus rhythm Abnrm T, consider ischemia, anterolateral lds Prolonged QT interval No significant change since last tracing Confirmed by [facility] MD, Wetzel Meester ([id number]) on 02/01/2016 5:08:03 PM  Radiology Mr Lumbar Spine W Wo Contrast  Result Date: 02/01/2016 CLINICAL DATA:  Prostate cancer with widespread bony  metastatic disease. The patient is currently unable to move his legs. Weakness. EXAM: MRI LUMBAR SPINE WITHOUT AND WITH CONTRAST TECHNIQUE: Multiplanar and multiecho pulse sequences of the lumbar spine were obtained without and with intravenous contrast. CONTRAST:  22mL MULTIHANCE GADOBENATE DIMEGLUMINE 529 MG/ML IV SOLN COMPARISON:  Multiple exams, including Pete/ 15/17 and 04/13/2015 FINDINGS: Segmentation: The lowest lumbar type non-rib-bearing vertebra is labeled as L5. Alignment: Dextroconvex lumbar scoliosis with significant rotary component. Vertebrae: Extensive marrow heterogeneity and patchy enhancement throughout the visualized lumbar spine and sacrum compatible with widespread osseous metastatic disease. This is probably superimposed on underlying chronic marrow heterogeneity. Most notable tumor involvement is in the spinous process of L2 where there is extraosseous extension of tumor suspected along the paraspinal tissues along with cortical demineralization of the L2 spinous process. Involvement results in a very small amount of epidural tumor along the posterior epidural space for example image 14/10. Disc desiccation and loss of disc height at all levels in the lumbar spine. 3 mm grade 1 anterolisthesis at L3-4. Conus medullaris: Extends to the L1 level and appears normal. Paraspinal and other soft tissues: Low-grade presacral edema, image 14/6. Low-level edema in the paraspinal musculature. Left periaortic node 1.2 cm in short axis on image 15/10. Disc levels: T12-L1:  No impingement.  Diffuse disc bulge. L1-2: Mild displacement of the left L1 nerve in the lateral extraforaminal space and borderline left subarticular lateral recess stenosis due to left eccentric disc bulge and left lateral extraforaminal disc protrusion. Mild left facet arthropathy. L2-3: Mild left foraminal stenosis and mild displacement of the right L2 nerve in the lateral extraforaminal space due to disc bulge and facet  arthropathy. L3-4: Moderate left foraminal stenosis and mild left subarticular lateral recess stenosis due to disc uncovering, intervertebral spurring, facet arthropathy, and disc bulge. L4-5: Moderate to prominent right and mild left foraminal stenosis due to disc bulge, facet arthropathy, and intervertebral spurring. L5-S1: Moderate bilateral foraminal stenosis and mild central narrowing of the thecal sac due to facet arthropathy, intervertebral spurring, and disc bulge. IMPRESSION: 1. Widespread osseous metastatic disease and resulting marrow heterogeneity and heterogeneous enhancement. The only area of apparent destructive metastatic disease is in the L2 spinous process were there some extraosseous extension of tumor and a very small amount of posterior epidural tumor. 2. Lumbar scoliosis, spondylosis, and degenerative disc disease, causing moderate to prominent impingement at L4-5 ; moderate impingement at L3-4 and L5-S1; and mild impingement at L1- 2 and L2- 3, as detailed above. Electronically Signed   By: Van Clines M.D.   On: 02/01/2016 19:22    Procedures Procedures (including critical care time)  Medications Ordered in ED Medications  milk and molasses enema (250 mLs Rectal Not Given 02/01/16 1643)  gadobenate dimeglumine (MULTIHANCE) injection 15 mL (not administered)  HYDROmorphone (DILAUDID) injection 1 mg (not administered)  sodium chloride 0.9 % bolus 1,000 mL (0 mLs Intravenous Stopped 02/01/16 1733)  morphine 4 MG/ML injection 4 mg (4 mg Intravenous Given 02/01/16 1642)  ondansetron (ZOFRAN) injection 4 mg (4 mg Intravenous Given 02/01/16 1642)  gadobenate dimeglumine (MULTIHANCE) injection 13 mL (13 mLs Intravenous Contrast Given 02/01/16 1822)     Initial Impression / Assessment and Plan / ED Course  I have reviewed the triage vital signs and the nursing notes.  Pertinent labs & imaging results that were available during my care of the patient were reviewed  by me and  considered in my medical decision making (see chart for details).  Clinical Course   Pt did have a bloody stool while here (before enema).  It is likely from straining with constipation, but his hgb has dropped since admission.  Pt's weakness in legs is not from cord compression.  Pt has severe pain with movement despite his fentanyl patch and morphine, so I ordered dilaudid.  The pt lives alone in an apartment, and I don't think it is going to be possible for pt to live there anymore.  I spoke with Dr. Hal Hope who will admit for obs for pain control.  Final Clinical Impressions(s) / ED Diagnoses   Final diagnoses:  Weakness  Prostate cancer metastatic to bone (HCC)  Intractable pain  Gastrointestinal hemorrhage, unspecified gastritis, unspecified gastrointestinal hemorrhage type  Anemia, unspecified anemia type    New Prescriptions New Prescriptions   No medications on file     Isla Pence, MD 02/01/16 1958

## 2016-02-01 NOTE — ED Notes (Signed)
Pt requesting something to eat.  Informed will speak to Dr. Gilford Raid.

## 2016-02-01 NOTE — ED Notes (Signed)
Patient transported to MRI 

## 2016-02-01 NOTE — H&P (Addendum)
History and Physical    Billy Lopez. FQ:[id number] DOB: 08/19/1940 DOA: 02/01/2016  PCP: [name], MD  Patient coming from: Home. Patient states he is in rehabilitation.  Chief Complaint: Mid back pain and lower extremity numbness and weakness.  HPI: Billy Lopez. is a 75 y.o. male with metastatic prostate cancer, HIV, COPD, hypertension, hepatitis C admitted 2 weeks ago for pneumonia presents to the ER because of persistent pain and weakness of the lower extremity with numbness over the last 1 week. Patient states he is usually ambulatory with help of a walker and transfers himself to the wheelchair. But over the last 1 week he is unable to do this. Denies any incontinence of urine or bowels and has been having some constipation. In the ER patient was noticed to have some blood in the stools. MRI of the lumbar spine does not show any cord compression but does show some nerve impingements. On exam patient is unable to move both his lower extremities with no sensation below his mid abdomen. Patient is being admitted for further workup.  ED Course: MRI of the L spine does not show any cord compression.  Review of Systems: As per HPI, rest all negative.   Past Medical History:  Diagnosis Date  . Allergic rhinitis, cause unspecified 07/21/2011  . Cervical spondylosis 07/21/2011  . CKD (chronic kidney disease) stage 3, GFR 30-59 ml/min 06/28/2015  . COPD (chronic obstructive pulmonary disease) ([place]) 07/21/2011  . GERD (gastroesophageal reflux disease)   . H/O blood clots   . 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 Hepatitis    Hep C  . Heroin withdrawal ([place]) 07/21/2011  . HIV infection ([place])   . Hx of radiation therapy 12/17/11 - 02/13/12   prostate  . Hypertension   . Lumbar spondylosis 07/21/2011  . Osteoarthritis 07/21/2011  . Pneumonia   . Prostate cancer ([place]) 09/19/11   gleason 7  . Prostate cancer metastatic to bone ([place]) 08/30/2015  . Rectal bleeding   . S/P radiation therapy  12/17/2011 through 02/13/2012   78 gray was delivered to the prostate in 40 fractions of 1.95 gray  . Septic joint of left shoulder region ([place]) 06/15/2015  . Substance abuse   . Wears dentures    top  . Wears glasses     Past Surgical History:  Procedure Laterality Date  . AMPUTATION Right 03/31/2014   Procedure: RIGHT SECOND TOE AMPUTATION ;  Surgeon: Wylene Simmer, MD;  Location: [facility];  Service: Orthopedics;  Laterality: Right;  . COLONOSCOPY N/A 11/17/2012   Procedure: COLONOSCOPY;  Surgeon: Ladene Artist, MD;  Location: [facility];  Service: Endoscopy;  Laterality: N/A;  possible APC  . FOOT SURGERY     bilat. ingrown toenails removed  . HAND SURGERY     right thumb  . HERNIA REPAIR    . HIP SURGERY     total bilateral replacement  . SHOULDER ARTHROSCOPY Left 06/15/2015   Procedure: ARTHROSCOPIC SHOULDER IRRIGATION AND DEBRIDEMENT;  Surgeon: Marchia Bond, MD;  Location: [facility];  Service: Orthopedics;  Laterality: Left;  . TOTAL HIP REVISION Left 10/08/2014   Procedure: POSTERIOR LEFT TOTAL HIP REVISION;  Surgeon: Paralee Cancel, MD;  Location: [facility];  Service: Orthopedics;  Laterality: Left;     reports that he quit smoking about 55 years ago. His smoking use included Cigarettes. He has a 10.00 pack-year smoking history. He has never used smokeless tobacco. He reports that he does  not drink alcohol or use drugs.  Allergies  Allergen Reactions  . Oxycodone Other (See Comments)    loopiness    Family History  Problem Relation Age of Onset  . Cancer Mother     unsure of type  . Alcohol abuse Other   . Arthritis Other   . Hypertension Other   . Colon cancer Neg Hx   . Diabetes Neg Hx     Prior to Admission medications   Medication Sig Start Date End Date Taking? Authorizing Provider  acetaminophen (TYLENOL) 325 MG tablet Take 2 tablets (650 mg total) by mouth every 6 (six) hours as needed for mild pain (or Fever >/= 101). 10/13/14  Yes Robbie Lis,  MD  bisacodyl (DULCOLAX) 10 MG suppository Place 10 mg rectally daily as needed for mild constipation or moderate constipation.   Yes Historical Provider, MD  bisacodyl (DULCOLAX) 5 MG EC tablet Take 1 tablet (5 mg total) by mouth daily as needed for moderate constipation. 10/13/14  Yes Robbie Lis, MD  cholecalciferol (VITAMIN D) 1000 UNITS tablet Take 1,000 Units by mouth daily.    Yes Historical Provider, MD  DESCOVY 200-25 MG tablet TAKE 1 TABLET BY MOUTH AT BEDTIME. 12/13/15  Yes Thayer Headings, MD  enalapril (VASOTEC) 20 MG tablet Take 20 mg by mouth daily. 11/06/15  Yes Historical Provider, MD  feeding supplement, ENSURE COMPLETE, (ENSURE COMPLETE) LIQD Take 237 mLs by mouth 2 (two) times daily between meals. 04/12/15  Yes Biagio Borg, MD  fentaNYL (DURAGESIC - DOSED MCG/HR) 50 MCG/HR Place 50 mcg onto the skin every 3 (three) days.   Yes Historical Provider, MD  furosemide (LASIX) 20 MG tablet Take 20 mg by mouth daily. 11/08/15  Yes Historical Provider, MD  HYDROcodone-acetaminophen (NORCO/VICODIN) 5-325 MG tablet Take 1 tablet by mouth every 6 (six) hours as needed for moderate pain.   Yes Historical Provider, MD  HYDROmorphone (DILAUDID) 2 MG tablet Take 1 tablet (2 mg total) by mouth every 6 (six) hours as needed for severe pain. Patient taking differently: Take 2 mg by mouth every 4 (four) hours as needed for severe pain.  01/22/16  Yes Velvet Bathe, MD  magnesium hydroxide (MILK OF MAGNESIA) 400 MG/5ML suspension Take 30 mLs by mouth daily as needed for mild constipation.   Yes Historical Provider, MD  potassium chloride (K-DUR) 10 MEQ tablet Take 1 tablet (10 mEq total) by mouth daily. 12/07/15  Yes Biagio Borg, MD  sennosides-docusate sodium (SENOKOT-S) 8.6-50 MG tablet Take 1 tablet by mouth daily.   Yes Historical Provider, MD  tamsulosin (FLOMAX) 0.4 MG CAPS capsule Take 1 capsule (0.4 mg total) by mouth 2 (two) times daily. 12/07/15  Yes Biagio Borg, MD  TIVICAY 50 MG tablet TAKE 1  TABLET (50 MG TOTAL) BY MOUTH DAILY. 12/13/15  Yes Thayer Headings, MD  tuberculin 5 UNIT/0.1ML injection Inject 0.1 mLs into the skin every 7 (seven) days.   Yes Historical Provider, MD  vitamin A 10000 UNIT capsule Take 10,000 Units by mouth every evening.   Yes Historical Provider, MD  vitamin C (ASCORBIC ACID) 500 MG tablet Take 500 mg by mouth every evening.   Yes Historical Provider, MD    Physical Exam: Vitals:   02/01/16 1456 02/01/16 1458 02/01/16 1955 02/01/16 2027  BP:  100/69 122/86 121/79  Pulse:  73 78 76  Resp:  18 16 16   Temp:  98.4 F (36.9 C)    TempSrc:  Oral  SpO2: 95% 95% 91% 92%      Constitutional: Not in distress. Vitals:   02/01/16 1456 02/01/16 1458 02/01/16 1955 02/01/16 2027  BP:  100/69 122/86 121/79  Pulse:  73 78 76  Resp:  18 16 16   Temp:  98.4 F (36.9 C)    TempSrc:  Oral    SpO2: 95% 95% 91% 92%   Eyes: Anicteric no pallor. ENMT: No discharge from the ears eyes nose and mouth. Neck: No mass felt. No neck rigidity. Respiratory: No rhonchi or crepitations. Cardiovascular: S1 and S2 heard. Abdomen: Soft nontender bowel sounds present. No guarding or rigidity. Musculoskeletal: No edema. Skin: No rash. Neurologic: Alert awake oriented to time place and person. Moves upper activities without difficulty. Unable to move both lower extremities. No hyperreflexia. Psychiatric: Appears normal.   Labs on Admission: I have personally reviewed following labs and imaging studies  CBC:  Recent Labs Lab 02/01/16 1607  WBC 5.7  NEUTROABS 3.8  HGB 10.5*  HCT 32.7*  MCV 84.1  PLT 99991111   Basic Metabolic Panel:  Recent Labs Lab 02/01/16 1607  NA 134*  K 3.8  CL 108  CO2 21*  GLUCOSE 107*  BUN 35*  CREATININE 1.03  CALCIUM 8.6*   GFR: Estimated Creatinine Clearance: 59.3 mL/min (by C-G formula based on SCr of 1.03 mg/dL). Liver Function Tests:  Recent Labs Lab 02/01/16 1607  AST 60*  ALT 16*  ALKPHOS 1,719*  BILITOT 0.3  PROT  6.2*  ALBUMIN 3.2*   No results for input(s): LIPASE, AMYLASE in the last 168 hours. No results for input(s): AMMONIA in the last 168 hours. Coagulation Profile: No results for input(s): INR, PROTIME in the last 168 hours. Cardiac Enzymes:  Recent Labs Lab 02/01/16 1607  TROPONINI <0.03   BNP (last 3 results) No results for input(s): PROBNP in the last 8760 hours. HbA1C: No results for input(s): HGBA1C in the last 72 hours. CBG: No results for input(s): GLUCAP in the last 168 hours. Lipid Profile: No results for input(s): CHOL, HDL, LDLCALC, TRIG, CHOLHDL, LDLDIRECT in the last 72 hours. Thyroid Function Tests: No results for input(s): TSH, T4TOTAL, FREET4, T3FREE, THYROIDAB in the last 72 hours. Anemia Panel: No results for input(s): VITAMINB12, FOLATE, FERRITIN, TIBC, IRON, RETICCTPCT in the last 72 hours. Urine analysis:    Component Value Date/Time   COLORURINE YELLOW 01/16/2016 2011   APPEARANCEUR CLEAR 01/16/2016 2011   LABSPEC 1.023 01/16/2016 2011   PHURINE 6.0 01/16/2016 2011   GLUCOSEU NEGATIVE 01/16/2016 2011   GLUCOSEU NEGATIVE 02/22/2015 1118   HGBUR NEGATIVE 01/16/2016 2011   BILIRUBINUR SMALL (A) 01/16/2016 2011   KETONESUR 15 (A) 01/16/2016 2011   PROTEINUR NEGATIVE 01/16/2016 2011   UROBILINOGEN 0.2 02/22/2015 1118   NITRITE NEGATIVE 01/16/2016 2011   LEUKOCYTESUR NEGATIVE 01/16/2016 2011   Sepsis Labs: @LABRCNTIP (procalcitonin:4,lacticidven:4) )No results found for this or any previous visit (from the past 240 hour(s)).   Radiological Exams on Admission: Mr Lumbar Spine W Wo Contrast  Result Date: 02/01/2016 CLINICAL DATA:  Prostate cancer with widespread bony metastatic disease. The patient is currently unable to move his legs. Weakness. EXAM: MRI LUMBAR SPINE WITHOUT AND WITH CONTRAST TECHNIQUE: Multiplanar and multiecho pulse sequences of the lumbar spine were obtained without and with intravenous contrast. CONTRAST:  36mL MULTIHANCE GADOBENATE  DIMEGLUMINE 529 MG/ML IV SOLN COMPARISON:  Multiple exams, including Pete/ 15/17 and 04/13/2015 FINDINGS: Segmentation: The lowest lumbar type non-rib-bearing vertebra is labeled as L5. Alignment: Dextroconvex lumbar scoliosis with significant  rotary component. Vertebrae: Extensive marrow heterogeneity and patchy enhancement throughout the visualized lumbar spine and sacrum compatible with widespread osseous metastatic disease. This is probably superimposed on underlying chronic marrow heterogeneity. Most notable tumor involvement is in the spinous process of L2 where there is extraosseous extension of tumor suspected along the paraspinal tissues along with cortical demineralization of the L2 spinous process. Involvement results in a very small amount of epidural tumor along the posterior epidural space for example image 14/10. Disc desiccation and loss of disc height at all levels in the lumbar spine. 3 mm grade 1 anterolisthesis at L3-4. Conus medullaris: Extends to the L1 level and appears normal. Paraspinal and other soft tissues: Low-grade presacral edema, image 14/6. Low-level edema in the paraspinal musculature. Left periaortic node 1.2 cm in short axis on image 15/10. Disc levels: T12-L1:  No impingement.  Diffuse disc bulge. L1-2: Mild displacement of the left L1 nerve in the lateral extraforaminal space and borderline left subarticular lateral recess stenosis due to left eccentric disc bulge and left lateral extraforaminal disc protrusion. Mild left facet arthropathy. L2-3: Mild left foraminal stenosis and mild displacement of the right L2 nerve in the lateral extraforaminal space due to disc bulge and facet arthropathy. L3-4: Moderate left foraminal stenosis and mild left subarticular lateral recess stenosis due to disc uncovering, intervertebral spurring, facet arthropathy, and disc bulge. L4-5: Moderate to prominent right and mild left foraminal stenosis due to disc bulge, facet arthropathy, and  intervertebral spurring. L5-S1: Moderate bilateral foraminal stenosis and mild central narrowing of the thecal sac due to facet arthropathy, intervertebral spurring, and disc bulge. IMPRESSION: 1. Widespread osseous metastatic disease and resulting marrow heterogeneity and heterogeneous enhancement. The only area of apparent destructive metastatic disease is in the L2 spinous process were there some extraosseous extension of tumor and a very small amount of posterior epidural tumor. 2. Lumbar scoliosis, spondylosis, and degenerative disc disease, causing moderate to prominent impingement at L4-5 ; moderate impingement at L3-4 and L5-S1; and mild impingement at L1- 2 and L2- 3, as detailed above. Electronically Signed   By: Van Clines M.D.   On: 02/01/2016 19:22   Dg Chest Portable 1 View  Result Date: 02/01/2016 CLINICAL DATA:  Pt has a hx of metastatic prostate cancer who presents today with weakness and numbness in his legs. Hx of COPD, hypertension, pneumonia. EXAM: PORTABLE CHEST 1 VIEW COMPARISON:  01/16/2016 FINDINGS: Cardiac silhouette is normal in size. No mediastinal or hilar masses. There are irregularly thickened interstitial markings bilaterally. Airspace opacity is noted in the left mid lung and both lung bases. The findings may be stable from the prior study when allowing for differences in technique and lower lung volumes on the current exam. No convincing pleural effusion.  No pneumothorax. Multiple sclerotic bone lesions consistent with widespread metastatic prostate carcinoma. IMPRESSION: 1. Bilateral interstitial thickening with some hazy left mid and bilateral lower lung zone airspace opacities. Findings may be due to chronic interstitial thickening with areas of atelectasis. Pneumonia should be considered if there are consistent clinical symptoms. Findings accentuated by low lung volumes. No convincing pulmonary edema. Electronically Signed   By: Lajean Manes M.D.   On: 02/01/2016  20:09     Assessment/Plan Principal Problem:   Intractable pain Active Problems:   Chronic hepatitis C without hepatic coma (HCC)   Essential hypertension   Back pain   Lower extremity weakness   Absolute anemia    1. Intractable mid back pain with bilateral lower extremity weakness -  on exam patient has paraplegia with numbness. Patient also has mid back pain with no sensation below his mid back. I have discussed with on-call neurologist Dr. Leonel Ramsay was advised to get a stat MRI of the T and C-spine. Since patient's symptoms ongoing for last 1 week holding off any steroids until we make sure there is no infectious process. Continue with pain relief medications. 2. Constipation and blood in the stools - follow CBC and ENEMA was ordered in the ER. 3. Metastatic prostate cancer - being followed up by oncologist. 4. HIV - last CD4 count was 390 in June 2017. Continue antiretrovirals. 5. COPD - presently not wheezing. 6. Hypertension on enalapril and Lasix. 7. Recently treated for pneumonia. Chest x-ray shows persistent infiltrates. Patient does not have any symptoms suggestive of pneumonia at this time. 8. Chronic anemia - follow CBC. 9. History of hepatitis C.   DVT prophylaxis: SCDs. Code Status: Full code.  Family Communication: Discussed with patient.  Disposition Plan: To be determined.  Consults called: Discussed with neurologist.  Admission status: Observation.    Rise Patience MD [facility] Pager [phone].  If 7PM-7AM, please contact night-coverage [email or web address] Password [facility]  02/01/2016, 10:17 PM

## 2016-02-01 NOTE — ED Notes (Signed)
Bed: WA17 Expected date:  Expected time:  Means of arrival:  Comments: EMS-pain control/cancer patient

## 2016-02-02 ENCOUNTER — Other Ambulatory Visit: Payer: Self-pay | Admitting: Radiation Oncology

## 2016-02-02 ENCOUNTER — Ambulatory Visit
Admit: 2016-02-02 | Discharge: 2016-02-02 | Disposition: A | Discharge status: Home / Self Care | Payer: Commercial Managed Care - HMO | Attending: Radiation Oncology | Admitting: Radiation Oncology

## 2016-02-02 ENCOUNTER — Observation Stay (HOSPITAL_COMMUNITY): Payer: Commercial Managed Care - HMO

## 2016-02-02 VITALS — BP 93/77 | HR 69 | Temp 97.9°F

## 2016-02-02 DIAGNOSIS — C7951 Secondary malignant neoplasm of bone: Secondary | ICD-10-CM | POA: Diagnosis present

## 2016-02-02 DIAGNOSIS — D649 Anemia, unspecified: Secondary | ICD-10-CM | POA: Diagnosis present

## 2016-02-02 DIAGNOSIS — G9529 Other cord compression: Secondary | ICD-10-CM | POA: Diagnosis present

## 2016-02-02 DIAGNOSIS — M549 Dorsalgia, unspecified: Secondary | ICD-10-CM | POA: Insufficient documentation

## 2016-02-02 DIAGNOSIS — Z6823 Body mass index (BMI) 23.0-23.9, adult: Secondary | ICD-10-CM | POA: Diagnosis not present

## 2016-02-02 DIAGNOSIS — B20 Human immunodeficiency virus [HIV] disease: Secondary | ICD-10-CM | POA: Diagnosis present

## 2016-02-02 DIAGNOSIS — H9192 Unspecified hearing loss, left ear: Secondary | ICD-10-CM | POA: Diagnosis present

## 2016-02-02 DIAGNOSIS — K649 Unspecified hemorrhoids: Secondary | ICD-10-CM | POA: Diagnosis present

## 2016-02-02 DIAGNOSIS — Z96643 Presence of artificial hip joint, bilateral: Secondary | ICD-10-CM | POA: Diagnosis present

## 2016-02-02 DIAGNOSIS — G893 Neoplasm related pain (acute) (chronic): Secondary | ICD-10-CM | POA: Diagnosis present

## 2016-02-02 DIAGNOSIS — G8220 Paraplegia, unspecified: Secondary | ICD-10-CM | POA: Diagnosis present

## 2016-02-02 DIAGNOSIS — M8448XA Pathological fracture, other site, initial encounter for fracture: Secondary | ICD-10-CM | POA: Diagnosis present

## 2016-02-02 DIAGNOSIS — C61 Malignant neoplasm of prostate: Secondary | ICD-10-CM

## 2016-02-02 DIAGNOSIS — Z87891 Personal history of nicotine dependence: Secondary | ICD-10-CM | POA: Diagnosis not present

## 2016-02-02 DIAGNOSIS — E440 Moderate protein-calorie malnutrition: Secondary | ICD-10-CM | POA: Diagnosis present

## 2016-02-02 DIAGNOSIS — Z809 Family history of malignant neoplasm, unspecified: Secondary | ICD-10-CM | POA: Diagnosis not present

## 2016-02-02 DIAGNOSIS — K219 Gastro-esophageal reflux disease without esophagitis: Secondary | ICD-10-CM | POA: Diagnosis present

## 2016-02-02 DIAGNOSIS — K921 Melena: Secondary | ICD-10-CM | POA: Diagnosis present

## 2016-02-02 DIAGNOSIS — Z923 Personal history of irradiation: Secondary | ICD-10-CM | POA: Insufficient documentation

## 2016-02-02 DIAGNOSIS — G9520 Unspecified cord compression: Secondary | ICD-10-CM | POA: Diagnosis present

## 2016-02-02 DIAGNOSIS — I129 Hypertensive chronic kidney disease with stage 1 through stage 4 chronic kidney disease, or unspecified chronic kidney disease: Secondary | ICD-10-CM | POA: Diagnosis present

## 2016-02-02 DIAGNOSIS — B182 Chronic viral hepatitis C: Secondary | ICD-10-CM | POA: Diagnosis present

## 2016-02-02 DIAGNOSIS — Z8701 Personal history of pneumonia (recurrent): Secondary | ICD-10-CM | POA: Diagnosis not present

## 2016-02-02 DIAGNOSIS — N183 Chronic kidney disease, stage 3 (moderate): Secondary | ICD-10-CM | POA: Diagnosis present

## 2016-02-02 DIAGNOSIS — J449 Chronic obstructive pulmonary disease, unspecified: Secondary | ICD-10-CM | POA: Diagnosis present

## 2016-02-02 DIAGNOSIS — K5900 Constipation, unspecified: Secondary | ICD-10-CM | POA: Diagnosis present

## 2016-02-02 LAB — URINALYSIS, ROUTINE W REFLEX MICROSCOPIC
BILIRUBIN URINE: NEGATIVE
Glucose, UA: NEGATIVE mg/dL
HGB URINE DIPSTICK: NEGATIVE
KETONES UR: NEGATIVE mg/dL
Leukocytes, UA: NEGATIVE
Nitrite: NEGATIVE
Protein, ur: NEGATIVE mg/dL
SPECIFIC GRAVITY, URINE: 1.021 (ref 1.005–1.030)
pH: 6 (ref 5.0–8.0)

## 2016-02-02 LAB — COMPREHENSIVE METABOLIC PANEL
ALK PHOS: 1745 U/L — AB (ref 38–126)
ALT: 16 U/L — AB (ref 17–63)
AST: 46 U/L — ABNORMAL HIGH (ref 15–41)
Albumin: 2.9 g/dL — ABNORMAL LOW (ref 3.5–5.0)
Anion gap: 4 — ABNORMAL LOW (ref 5–15)
BILIRUBIN TOTAL: 0.3 mg/dL (ref 0.3–1.2)
BUN: 28 mg/dL — ABNORMAL HIGH (ref 6–20)
CALCIUM: 8.6 mg/dL — AB (ref 8.9–10.3)
CO2: 20 mmol/L — ABNORMAL LOW (ref 22–32)
CREATININE: 0.93 mg/dL (ref 0.61–1.24)
Chloride: 111 mmol/L (ref 101–111)
Glucose, Bld: 100 mg/dL — ABNORMAL HIGH (ref 65–99)
Potassium: 4.4 mmol/L (ref 3.5–5.1)
Sodium: 135 mmol/L (ref 135–145)
TOTAL PROTEIN: 5.8 g/dL — AB (ref 6.5–8.1)

## 2016-02-02 LAB — CBC
HCT: 32.1 % — ABNORMAL LOW (ref 39.0–52.0)
HEMOGLOBIN: 10.3 g/dL — AB (ref 13.0–17.0)
MCH: 27 pg (ref 26.0–34.0)
MCHC: 32.1 g/dL (ref 30.0–36.0)
MCV: 84.3 fL (ref 78.0–100.0)
Platelets: 213 10*3/uL (ref 150–400)
RBC: 3.81 MIL/uL — AB (ref 4.22–5.81)
RDW: 16.4 % — ABNORMAL HIGH (ref 11.5–15.5)
WBC: 5.8 10*3/uL (ref 4.0–10.5)

## 2016-02-02 LAB — CBC WITH DIFFERENTIAL/PLATELET
BASOS ABS: 0 10*3/uL (ref 0.0–0.1)
BASOS PCT: 1 %
EOS ABS: 0.1 10*3/uL (ref 0.0–0.7)
Eosinophils Relative: 2 %
HCT: 30.6 % — ABNORMAL LOW (ref 39.0–52.0)
HEMOGLOBIN: 9.9 g/dL — AB (ref 13.0–17.0)
Lymphocytes Relative: 20 %
Lymphs Abs: 1.3 10*3/uL (ref 0.7–4.0)
MCH: 27.1 pg (ref 26.0–34.0)
MCHC: 32.4 g/dL (ref 30.0–36.0)
MCV: 83.8 fL (ref 78.0–100.0)
MONOS PCT: 15 %
Monocytes Absolute: 0.9 10*3/uL (ref 0.1–1.0)
NEUTROS PCT: 62 %
Neutro Abs: 4 10*3/uL (ref 1.7–7.7)
Platelets: 200 10*3/uL (ref 150–400)
RBC: 3.65 MIL/uL — AB (ref 4.22–5.81)
RDW: 16.2 % — ABNORMAL HIGH (ref 11.5–15.5)
WBC: 6.3 10*3/uL (ref 4.0–10.5)

## 2016-02-02 MED ORDER — SENNOSIDES-DOCUSATE SODIUM 8.6-50 MG PO TABS
2.0000 | ORAL_TABLET | Freq: Two times a day (BID) | ORAL | Status: DC
  Administered 2016-02-02 – 2016-02-06 (×7): 2 via ORAL
  Filled 2016-02-02 (×7): qty 2

## 2016-02-02 MED ORDER — POLYETHYLENE GLYCOL 3350 17 G PO PACK
17.0000 g | PACK | Freq: Every day | ORAL | Status: DC
  Administered 2016-02-03 – 2016-02-06 (×2): 17 g via ORAL
  Filled 2016-02-02 (×3): qty 1

## 2016-02-02 MED ORDER — HYDROMORPHONE HCL 1 MG/ML IJ SOLN: 0.5000 mg | INTRAMUSCULAR | Status: DC | PRN

## 2016-02-02 MED ORDER — HYDROMORPHONE HCL 1 MG/ML IJ SOLN
0.5000 mg | Freq: Two times a day (BID) | INTRAMUSCULAR | Status: DC | PRN
Start: 2016-02-02 — End: 2016-02-02
  Administered 2016-02-02: 1 mg via INTRAVENOUS
  Filled 2016-02-02: qty 1

## 2016-02-02 MED ORDER — DEXAMETHASONE SODIUM PHOSPHATE 4 MG/ML IJ SOLN
8.0000 mg | Freq: Four times a day (QID) | INTRAMUSCULAR | Status: DC
  Administered 2016-02-02 – 2016-02-06 (×16): 8 mg via INTRAVENOUS
  Filled 2016-02-02 (×18): qty 2

## 2016-02-02 MED ORDER — HYDROCODONE-ACETAMINOPHEN 5-325 MG PO TABS
1.0000 | ORAL_TABLET | ORAL | Status: DC | PRN
  Administered 2016-02-02 – 2016-02-06 (×9): 2 via ORAL
  Filled 2016-02-02 (×9): qty 2

## 2016-02-02 MED ORDER — HYDROMORPHONE HCL 1 MG/ML IJ SOLN
0.5000 mg | INTRAMUSCULAR | Status: DC | PRN
  Administered 2016-02-02 – 2016-02-06 (×13): 0.5 mg via INTRAVENOUS
  Filled 2016-02-02 (×13): qty 1

## 2016-02-02 MED ORDER — DEXAMETHASONE SODIUM PHOSPHATE 10 MG/ML IJ SOLN
10.0000 mg | Freq: Once | INTRAMUSCULAR | Status: AC
  Administered 2016-02-02: 10 mg via INTRAVENOUS
  Filled 2016-02-02: qty 1

## 2016-02-02 NOTE — Progress Notes (Signed)
[facility] Progress Note  Patient: Billy Lopez. FQ:[id number]   PCP: [name], MD DOB: [birth year]   DOA: 02/01/2016   DOS: 02/02/2016   Date of Service: the patient was seen and examined on 02/02/2016  Subjective: Patient is mentioning that his pain is well-controlled. Complains about constipation. He mentions he has been having lower extremity weakness as well as numbness since at least last 1 month. He mentions about occasional episodes of incontinence. Nutrition: Tolerating oral diet  Brief hospital course: Pt. with PMH of stage IV prostate cancer, HIV, chronic hep C, substance abuse, essential hypertension; admitted on 02/01/2016, with complaint of progressive weakness, was found to have cord compression. Currently further plan is continue steroids and radiation therapy.  Assessment and Plan: 1. Prostate cancer metastatic to bone [facility]) Cord compression off T1 and T2. Patient presents with progressively worsening weakness and numbness of bilateral lower extremity with bricks reflexes. Discussed with neurosurgery who recommends patient will be a poor candidate for surgical intervention due to prolonged duration of symptoms which entails limited postoperative recovery. Discussed with radiation oncology who is performing urgent radiation for the patient. Also consulted medical oncology. We'll continue to closely monitor the patient.  2. 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 Patient is on fentanyl patch. Currently we will use when necessary Dilaudid for breakthrough. Also increasing Norco and would prefer oral pain medication or IV.  3. Constipation. CBC relatively stable. We'll continue laxatives.  4. HIV. Continue anti-retroviral.  Pain management: Home fentanyl patch, when necessary Dilaudid, when necessary Norco Activity: Currently on bedrest.  Bowel regimen: last BM 02/01/16 Diet: Cardiac diet DVT Prophylaxis: mechanical compression device.  Advance goals of  care discussion: Full code. Patient mentions his daughter will be his power of attorney.  Family Communication: no family was present at bedside, at the time of interview.   Disposition:  Discharge to likely home versus SNF. Expected discharge date: 02/06/16,  Consultants: Phone consultation with neurology, neurosurgery, medical oncology, radiation oncology Procedures: Radiation  Antibiotics: Anti-infectives    Start     Dose/Rate Route Frequency Ordered Stop   02/02/16 0000  dolutegravir (TIVICAY) tablet 50 mg     50 mg Oral Daily 02/01/16 2210     02/01/16 2215  emtricitabine-tenofovir AF (DESCOVY) 200-25 MG per tablet 1 tablet     1 tablet Oral Daily at bedtime 02/01/16 2210          Intake/Output Summary (Last 24 hours) at 02/02/16 1723 Last data filed at 02/02/16 1722  Gross per 24 hour  Intake             1152 ml  Output             1900 ml  Net             -748 ml   Filed Weights   02/02/16 0700  Weight: 75.5 kg (166 lb 7.2 oz)    Objective: Physical Exam: Vitals:   02/01/16 2027 02/01/16 2228 02/02/16 0551 02/02/16 0700  BP: 121/79 97/66 92/67    Pulse: 76 83 79   Resp: 16 16 14    Temp:  100 F (37.8 C) 97.7 F (36.5 C)   TempSrc:  Oral Oral   SpO2: 92% 95% 96%   Weight:    75.5 kg (166 lb 7.2 oz)  Height:  5\' 11"  (1.803 m)      General: Alert, Awake and Oriented to Time, Place and Person. Appear in mild distress Eyes: PERRL, Conjunctiva normal  ENT: Oral Mucosa clear moist. Neck: no JVD, no Abnormal Mass Or lumps Cardiovascular: S1 and S2 Present, no Murmur, Respiratory: Bilateral Air entry equal and Decreased, Clear to Auscultation, no Crackles, no wheezes Abdomen: Bowel Sound present, Soft and no tenderness Skin: no redness, no Rash  Extremities: no Pedal edema, no calf tenderness Neurologic: Mental status AAOx3, speech normal, attention normal,  Cranial Nerves PERRL, EOM normal and present, Motor strength bilateral upper extremity equal strength  4/5, LE 1/5 Sensation absent to light touch in LE Reflexes present knee and biceps, babinski equivocal,  Cerebellar test normal finger nose finger.  Data Reviewed: CBC:  Recent Labs Lab 02/01/16 1607 02/01/16 2248 02/02/16 0356 02/02/16 0956  WBC 5.7 6.5 6.3 5.8  NEUTROABS 3.8  --  4.0  --   HGB 10.5* 10.7* 9.9* 10.3*  HCT 32.7* 33.2* 30.6* 32.1*  MCV 84.1 81.8 83.8 84.3  PLT 218 212 200 [id number]   Basic Metabolic Panel:  Recent Labs Lab 02/01/16 1607 02/02/16 0356  NA 134* 135  K 3.8 4.4  CL 108 111  CO2 21* 20*  GLUCOSE 107* 100*  BUN 35* 28*  CREATININE 1.03 0.93  CALCIUM 8.6* 8.6*    Liver Function Tests:  Recent Labs Lab 02/01/16 1607 02/02/16 0356  AST 60* 46*  ALT 16* 16*  ALKPHOS 1,719* 1,745*  BILITOT 0.3 0.3  PROT 6.2* 5.8*  ALBUMIN 3.2* 2.9*   No results for input(s): LIPASE, AMYLASE in the last 168 hours. No results for input(s): AMMONIA in the last 168 hours. Coagulation Profile: No results for input(s): INR, PROTIME in the last 168 hours. Cardiac Enzymes:  Recent Labs Lab 02/01/16 1607  TROPONINI <0.03   BNP (last 3 results) No results for input(s): PROBNP in the last 8760 hours.  CBG: No results for input(s): GLUCAP in the last 168 hours.  Studies: Mr Cervical Spine Wo Contrast  Result Date: 02/02/2016 CLINICAL DATA:  Paraplegia with loss of sensation from the mid back downward. Symptoms began 1 week ago. EXAM: MRI THORACIC SPINE WITHOUT CONTRAST; MRI CERVICAL SPINE WITHOUT CONTRAST TECHNIQUE: Multiplanar and multiecho pulse sequences of the thoracic spine were obtained without intravenous contrast.; Multiplanar and multiecho pulse sequences of the cervical spine, to include the craniocervical junction and cervicothoracic junction, were obtained according to standard protocol without intravenous contrast. CONTRAST:  None. Contrast could not be given because patient received gadolinium 12 hours earlier for the lumbar MRI. COMPARISON:  Bone  scan 04/13/2015. FINDINGS: CERVICAL: Alignment: Straightening of the normal cervical lordosis. Vertebrae: Widespread osseous metastatic disease. Endplate softening, due to a combination of degenerative disc disease and tumor, has resulted in mild diffuse loss of vertebral body height particularly from C3 through C6. No dominant compression fracture or retropulsed bony fragment. Metastatic disease is seen to the clivus. Cord: Mild cord flattening due to spondylosis at multiple levels described below. No definite epidural tumor in the cervical region. Posterior Fossa: Unremarkable. Vertebral Arteries: Patent. Paraspinal tissues: Unremarkable Disc levels: C2-3:  Annular bulge. C3-4: Central protrusion with stenosis. BILATERAL foraminal narrowing. Mild cord flattening. C4-5: Central protrusion with stenosis. BILATERAL foraminal narrowing. Mild cord flattening. C5-6: Annular bulge with osseous ridging. Central stenosis with BILATERAL foraminal narrowing. Moderate cord flattening. C6-7: Central protrusion with stenosis. BILATERAL foraminal narrowing. Mild cord flattening. C7-T1:  Unremarkable. THORACIC: Numbering of the thoracic spine was accomplished by counting down the odontoid. Normal segmentation is present. Normal alignment. Widespread metastatic disease is seen in all thoracic vertebrae. Early loss of vertebral body height at  T4 with slight retropulsion. Posterior element involvement most notable at T2. BILATERAL rib involvement most notable at T2, RIGHT greater than LEFT, T5, RIGHT greater than LEFT, along with BILATERAL pleural effusions representing metastatic disease outside the spinal axis. There is abnormal cord signal extending from C7-T1 through T2-T3 related to severe cord compression at the T2 level due to epidural tumor. The individual disc spaces are examined as follows: T1-2: Minor disc pathology. Epidural tumor surrounds the cord resulting in severe compression an abnormal central cord signal. A large  deposit is present dorsally related to posterior element involvement. See image 9 series 12. T2-3: Minor disc pathology. Abnormal cord signal extends to the T2-3 interspace. Dorsal and ventral epidural tumor, greater on the RIGHT. T3-4:  RIGHT-sided epidural tumor without cord compression. T4-5: Pathologic T4 fracture. Epidural tumor extends ventrally and to the RIGHT resulting and stenosis and mild cord displacement. No abnormal cord signal. T5-6:  Unremarkable. T6-7:  Central and leftward protrusion.  No cord compression. T7-8:  Unremarkable disc space.  Early epidural tumor on the LEFT. T8-9:  Annular bulge. T9-10:  Unremarkable. T10-11:  Central protrusion. T11-12:  Annular bulge. IMPRESSION: Widespread metastatic disease throughout the cervical and thoracic spine. Early pathologic compression at T4. Mild diffuse loss of cervical vertebrae height C3-C6. Significant cord compression due to epidural tumor at the T2 level, with abnormal cord signal. See discussion above. Extensive extra-spinal tumor, involving the RIGHT ribs, greatest at T5. Critical Value/emergent results were called by telephone at the time of interpretation on 02/02/2016 at 7:45 AM to Dr. Posey Pronto, who verbally acknowledged these results. Electronically Signed   By: Staci Righter M.D.   On: 02/02/2016 08:29   Mr Thoracic Spine Wo Contrast  Result Date: 02/02/2016 CLINICAL DATA:  Paraplegia with loss of sensation from the mid back downward. Symptoms began 1 week ago. EXAM: MRI THORACIC SPINE WITHOUT CONTRAST; MRI CERVICAL SPINE WITHOUT CONTRAST TECHNIQUE: Multiplanar and multiecho pulse sequences of the thoracic spine were obtained without intravenous contrast.; Multiplanar and multiecho pulse sequences of the cervical spine, to include the craniocervical junction and cervicothoracic junction, were obtained according to standard protocol without intravenous contrast. CONTRAST:  None. Contrast could not be given because patient received gadolinium  12 hours earlier for the lumbar MRI. COMPARISON:  Bone scan 04/13/2015. FINDINGS: CERVICAL: Alignment: Straightening of the normal cervical lordosis. Vertebrae: Widespread osseous metastatic disease. Endplate softening, due to a combination of degenerative disc disease and tumor, has resulted in mild diffuse loss of vertebral body height particularly from C3 through C6. No dominant compression fracture or retropulsed bony fragment. Metastatic disease is seen to the clivus. Cord: Mild cord flattening due to spondylosis at multiple levels described below. No definite epidural tumor in the cervical region. Posterior Fossa: Unremarkable. Vertebral Arteries: Patent. Paraspinal tissues: Unremarkable Disc levels: C2-3:  Annular bulge. C3-4: Central protrusion with stenosis. BILATERAL foraminal narrowing. Mild cord flattening. C4-5: Central protrusion with stenosis. BILATERAL foraminal narrowing. Mild cord flattening. C5-6: Annular bulge with osseous ridging. Central stenosis with BILATERAL foraminal narrowing. Moderate cord flattening. C6-7: Central protrusion with stenosis. BILATERAL foraminal narrowing. Mild cord flattening. C7-T1:  Unremarkable. THORACIC: Numbering of the thoracic spine was accomplished by counting down the odontoid. Normal segmentation is present. Normal alignment. Widespread metastatic disease is seen in all thoracic vertebrae. Early loss of vertebral body height at T4 with slight retropulsion. Posterior element involvement most notable at T2. BILATERAL rib involvement most notable at T2, RIGHT greater than LEFT, T5, RIGHT greater than LEFT, along with BILATERAL  pleural effusions representing metastatic disease outside the spinal axis. There is abnormal cord signal extending from C7-T1 through T2-T3 related to severe cord compression at the T2 level due to epidural tumor. The individual disc spaces are examined as follows: T1-2: Minor disc pathology. Epidural tumor surrounds the cord resulting in  severe compression an abnormal central cord signal. A large deposit is present dorsally related to posterior element involvement. See image 9 series 12. T2-3: Minor disc pathology. Abnormal cord signal extends to the T2-3 interspace. Dorsal and ventral epidural tumor, greater on the RIGHT. T3-4:  RIGHT-sided epidural tumor without cord compression. T4-5: Pathologic T4 fracture. Epidural tumor extends ventrally and to the RIGHT resulting and stenosis and mild cord displacement. No abnormal cord signal. T5-6:  Unremarkable. T6-7:  Central and leftward protrusion.  No cord compression. T7-8:  Unremarkable disc space.  Early epidural tumor on the LEFT. T8-9:  Annular bulge. T9-10:  Unremarkable. T10-11:  Central protrusion. T11-12:  Annular bulge. IMPRESSION: Widespread metastatic disease throughout the cervical and thoracic spine. Early pathologic compression at T4. Mild diffuse loss of cervical vertebrae height C3-C6. Significant cord compression due to epidural tumor at the T2 level, with abnormal cord signal. See discussion above. Extensive extra-spinal tumor, involving the RIGHT ribs, greatest at T5. Critical Value/emergent results were called by telephone at the time of interpretation on 02/02/2016 at 7:45 AM to Dr. Posey Pronto, who verbally acknowledged these results. Electronically Signed   By: Staci Righter M.D.   On: 02/02/2016 08:29   Mr Lumbar Spine W Wo Contrast  Result Date: 02/01/2016 CLINICAL DATA:  Prostate cancer with widespread bony metastatic disease. The patient is currently unable to move his legs. Weakness. EXAM: MRI LUMBAR SPINE WITHOUT AND WITH CONTRAST TECHNIQUE: Multiplanar and multiecho pulse sequences of the lumbar spine were obtained without and with intravenous contrast. CONTRAST:  66mL MULTIHANCE GADOBENATE DIMEGLUMINE 529 MG/ML IV SOLN COMPARISON:  Multiple exams, including Pete/ 15/17 and 04/13/2015 FINDINGS: Segmentation: The lowest lumbar type non-rib-bearing vertebra is labeled as L5.  Alignment: Dextroconvex lumbar scoliosis with significant rotary component. Vertebrae: Extensive marrow heterogeneity and patchy enhancement throughout the visualized lumbar spine and sacrum compatible with widespread osseous metastatic disease. This is probably superimposed on underlying chronic marrow heterogeneity. Most notable tumor involvement is in the spinous process of L2 where there is extraosseous extension of tumor suspected along the paraspinal tissues along with cortical demineralization of the L2 spinous process. Involvement results in a very small amount of epidural tumor along the posterior epidural space for example image 14/10. Disc desiccation and loss of disc height at all levels in the lumbar spine. 3 mm grade 1 anterolisthesis at L3-4. Conus medullaris: Extends to the L1 level and appears normal. Paraspinal and other soft tissues: Low-grade presacral edema, image 14/6. Low-level edema in the paraspinal musculature. Left periaortic node 1.2 cm in short axis on image 15/10. Disc levels: T12-L1:  No impingement.  Diffuse disc bulge. L1-2: Mild displacement of the left L1 nerve in the lateral extraforaminal space and borderline left subarticular lateral recess stenosis due to left eccentric disc bulge and left lateral extraforaminal disc protrusion. Mild left facet arthropathy. L2-3: Mild left foraminal stenosis and mild displacement of the right L2 nerve in the lateral extraforaminal space due to disc bulge and facet arthropathy. L3-4: Moderate left foraminal stenosis and mild left subarticular lateral recess stenosis due to disc uncovering, intervertebral spurring, facet arthropathy, and disc bulge. L4-5: Moderate to prominent right and mild left foraminal stenosis due to disc bulge,  facet arthropathy, and intervertebral spurring. L5-S1: Moderate bilateral foraminal stenosis and mild central narrowing of the thecal sac due to facet arthropathy, intervertebral spurring, and disc bulge. IMPRESSION:  1. Widespread osseous metastatic disease and resulting marrow heterogeneity and heterogeneous enhancement. The only area of apparent destructive metastatic disease is in the L2 spinous process were there some extraosseous extension of tumor and a very small amount of posterior epidural tumor. 2. Lumbar scoliosis, spondylosis, and degenerative disc disease, causing moderate to prominent impingement at L4-5 ; moderate impingement at L3-4 and L5-S1; and mild impingement at L1- 2 and L2- 3, as detailed above. Electronically Signed   By: Van Clines M.D.   On: 02/01/2016 19:22   Dg Chest Portable 1 View  Result Date: 02/01/2016 CLINICAL DATA:  Pt has a hx of metastatic prostate cancer who presents today with weakness and numbness in his legs. Hx of COPD, hypertension, pneumonia. EXAM: PORTABLE CHEST 1 VIEW COMPARISON:  01/16/2016 FINDINGS: Cardiac silhouette is normal in size. No mediastinal or hilar masses. There are irregularly thickened interstitial markings bilaterally. Airspace opacity is noted in the left mid lung and both lung bases. The findings may be stable from the prior study when allowing for differences in technique and lower lung volumes on the current exam. No convincing pleural effusion.  No pneumothorax. Multiple sclerotic bone lesions consistent with widespread metastatic prostate carcinoma. IMPRESSION: 1. Bilateral interstitial thickening with some hazy left mid and bilateral lower lung zone airspace opacities. Findings may be due to chronic interstitial thickening with areas of atelectasis. Pneumonia should be considered if there are consistent clinical symptoms. Findings accentuated by low lung volumes. No convincing pulmonary edema. Electronically Signed   By: Lajean Manes M.D.   On: 02/01/2016 20:09     Scheduled Meds: . cholecalciferol  1,000 Units Oral Daily  . dexamethasone  8 mg Intravenous Q6H  . dolutegravir  50 mg Oral Daily  . emtricitabine-tenofovir AF  1 tablet Oral  QHS  . enalapril  20 mg Oral Daily  . feeding supplement (ENSURE ENLIVE)  237 mL Oral BID BM  . fentaNYL  50 mcg Transdermal Q72H  . furosemide  20 mg Oral Daily  . polyethylene glycol  17 g Oral Daily  . potassium chloride  10 mEq Oral Daily  . senna-docusate  2 tablet Oral BID  . vitamin A  10,000 Units Oral QPM  . vitamin C  500 mg Oral QPM   Continuous Infusions: . sodium chloride 75 mL/hr at 02/02/16 1035   PRN Meds: acetaminophen **OR** acetaminophen, bisacodyl, HYDROcodone-acetaminophen, HYDROmorphone (DILAUDID) injection, magnesium hydroxide, ondansetron **OR** ondansetron (ZOFRAN) IV  Time spent: 30 minutes  Author: Berle Mull, MD [facility] Pager: [phone] 02/02/2016 5:23 PM  If 7PM-7AM, please contact night-coverage at [email or web address], password [facility]

## 2016-02-02 NOTE — Progress Notes (Addendum)
Mr. Gotay here for simulation today.  Primary prostate cancer with bone metastases.  He reports level 8/10 pain in his neck, shoulders, and back- right scapula region with intermittent shooting pains. Given Dilaudid IV push prior to transport  to Rad/Onc for simulation.  Diagnosed with pneumonia last week.  Travel by bed from inpatient unit - [place], room 1323.   BP 93/77 (BP Location: Right Arm, Patient Position: Supine, Cuff Size: Normal)   Pulse 69   Temp 97.9 F (36.6 C)          13:16pm - sleeping lightly with head of bed elevated.

## 2016-02-02 NOTE — Progress Notes (Signed)
Initial Nutrition Assessment  DOCUMENTATION CODES:   Non-severe (moderate) malnutrition in context of acute illness/injury  INTERVENTION:  - Will order Ensure Enlive po BID, each supplement provides 350 kcal and 20 grams of protein - RD will continue to monitor for additional nutrition-related needs.  NUTRITION DIAGNOSIS:   Increased nutrient needs related to catabolic illness, cancer and cancer related treatments, chronic illness as evidenced by estimated needs.  GOAL:   Patient will meet greater than or equal to 90% of their needs  MONITOR:   PO intake, Supplement acceptance, Weight trends, Labs, I & O's  REASON FOR ASSESSMENT:   Low Braden  ASSESSMENT:   75 y.o. male with metastatic prostate cancer, HIV, COPD, hypertension, hepatitis C admitted 2 weeks ago for pneumonia presents to the ER because of persistent pain and weakness of the lower extremity with numbness over the last 1 week. Patient states he is usually ambulatory with help of a walker and transfers himself to the wheelchair. But over the last 1 week he is unable to do this. In the ER patient was noticed to have some blood in the stools. MRI of the lumbar spine does not show any cord compression but does show some nerve impingements.   Pt seen for low Braden. BMI indicates normal weight. Pt has been NPO and unable to meet nutrition needs. Pt with hx of metastatic prostate cancer with bone mets and to be reviewed for palliative radiation; pt is currently out of the room and left ~30 minutes ago to go to the radiation simulation room. No family/visitors present at this time.   Pt was seen by an RD on admission 2 weeks ago and physical assessment at that time showed moderate muscle and moderate fat wasting. Weight fluctuations over the past 5 months and CBW is -2 lbs (1.2% body weight) since 01/16/16 which is not significant for time frame; will continue to monitor weight trends.   Pt was ordered Ensure Enlive BID on  previous admission so will order this again and monitor for need for adjustment of supplement order at follow-up.  Medications reviewed; PRN Dulcolax, 1000 units vitamin D/day, 20 mg oral Lasix/day, PRN MOM, PRN Zofran, 10 mEq oral KCl/day, 1 tablet Senokot/day, 10000 units vitamin A/day, 500 mg ascorbic acid/day. Labs reviewed; BUN: 28 mg/dL, Ca: 8.6 mg/dL, Alk Phos elevated and trending up, AST/ALT elevated. IVF: NS @ 75 mL/hr.    Diet Order:  Diet NPO time specified  Skin:  Reviewed, no issues  Last BM:  8/31  Height:   Ht Readings from Last 1 Encounters:  02/01/16 _0  (1.803 m)    Weight:   Wt Readings from Last 1 Encounters:  02/02/16 166 lb 7.2 oz (75.5 kg)    Ideal Body Weight:  78.18 kg  BMI:  Body mass index is 23.21 kg/m.  Estimated Nutritional Needs:   Kcal:  2200-2400  Protein:  105-115 grams  Fluid:  >/= 2 L/day  EDUCATION NEEDS:   No education needs identified at this time    Billy Matin, MS, RD, LDN Inpatient Clinical Dietitian Pager # [phone] After hours/weekend pager # [phone]

## 2016-02-02 NOTE — Progress Notes (Signed)
Initial Nutrition Assessment  DOCUMENTATION CODES:   Non-severe (moderate) malnutrition in context of chronic illness  INTERVENTION:  -Continue Ensure Enlive po BID, each supplement provides 350 kcal and 20 grams of protein w/ diet advancement  NUTRITION DIAGNOSIS:   Malnutrition related to chronic illness as evidenced by moderate depletion of body fat, moderate depletions of muscle mass.  GOAL:   Patient will meet greater than or equal to 90% of their needs  MONITOR:   PO intake, Supplement acceptance, I & O's, Labs, Weight trends  REASON FOR ASSESSMENT:   Low Braden    ASSESSMENT:   Billy Lopez. is a 75 y.o. male with metastatic prostate cancer, HIV, COPD, hypertension, hepatitis C admitted 2 weeks ago for pneumonia presents to the ER because of persistent pain and weakness of the lower extremity with numbness over the last 1 week  Spoke with Billy Lopez at bedside. He endorses good appetite but wt fluctuations. Per chart review he was 144# 01/21/2016, now up to 166# upon admission. Has not had any meals yet.  Nutrition-Focused physical exam completed. Findings are moderate fat depletion, moderate muscle depletion, and no edema.   Labs and medications reviewed: Vitamin D, Vitamin A, Vitamin C Dexamethasone, KCL 23mEq. Senokot-S  Diet Order:  Diet NPO time specified  Skin:  Reviewed, no issues  Last BM:  02/01/2016  Height:   Ht Readings from Last 1 Encounters:  02/01/16 5\' 11"  (1.803 m)    Weight:   Wt Readings from Last 1 Encounters:  02/02/16 166 lb 7.2 oz (75.5 kg)    Ideal Body Weight:  78.18 kg  BMI:  Body mass index is 23.21 kg/m.  Estimated Nutritional Needs:   Kcal:  1500-1900 calories  Protein:  75-90 grams  Fluid:  >/= 1.5L  EDUCATION NEEDS:   No education needs identified at this time  Satira Anis. Camri Molloy, MS, RD LDN Inpatient Clinical Dietitian Pager [phone]

## 2016-02-02 NOTE — Progress Notes (Addendum)
CSW assisting with d/c planning. Pt was recently hospitalized at [facility] from  01/16/16 - 01/22/16 and d/c to [facility] for ST Rehab. Please see psychosocial assessment dated 01/22/16.Pt readmitted to [facility] on 8/31 under observation status with intractable pain. Pt status changed to inpatient today. Pt has multiple medical issues including prostate cancer. Pt has radiation treatments scheduled at [facility] cancer center through September 18th. CSW met with pt at bedside to assist with d/c planning. Pt feels continued rehab is needed at d/c. Pt is hopeful that he will be able to return to his apt following rehab. [facility] has been contacted and clinicals sent for review. SNF is able to readmit pending [employer] silverback authorization. MD reported during am rounds that it is too soon to order PT eval. Eval will be ordered once medically appropriate. Pt has motorized w/c in his room. [facility] is unable to transport w/c to their facility. Pt reports that w/c needs to be charged and he will have charger brought to hospital. Medicaid transport [phone] ) may be able to transport pt / w/c at time of d/c. Other options may be available ( [facility] transport opened M-F $ 65 one way. Unclear if pt can afford this cost ). Abby Potash CSW from [place] cancer [facility] also investing options for transporting w/c. CSW will continue to follow to offer support and assist with d/c planning needs.  Werner Lean LCSW [phone]

## 2016-02-02 NOTE — Progress Notes (Signed)
Pt off floor to MRI

## 2016-02-02 NOTE — Progress Notes (Signed)
I called and spoke to Mr. Grenda nurse Rollene Fare on [facility] at 11:00 this morning and confirmed that Mr. Hasek would be coming to radiation today for CT simulation at 1:30. A transporter would be there on the floor for him to arrive in Radiation at 12:30. Rollene Fare will premedicate Mr. Glasier with pain medicine before he is transported.

## 2016-02-02 NOTE — Progress Notes (Signed)
  Radiation Oncology         [id number]) [phone] ________________________________  Name: Billy Lopez. MRN: [id number]  Date: 02/01/2016  DOB: 02/28/1941  SIMULATION AND TREATMENT PLANNING NOTE  Inpatient  DIAGNOSIS:  C79.51 bone metastases   NARRATIVE:  The patient was brought to the [facility].  Identity was confirmed.  All relevant records and images related to the planned course of therapy were reviewed.  The patient freely provided informed written consent to proceed with treatment after reviewing the details related to the planned course of therapy. The consent form was witnessed and verified by the simulation staff.    Then, the patient was set-up in a stable reproducible  supine position for radiation therapy. Aquaplast head and shoulder mask was custom made.  CT images were obtained.  Surface markings were placed.  The CT images were loaded into the planning software.    TREATMENT PLANNING NOTE: Treatment planning then occurred.  The radiation prescription was entered and confirmed.    A total of 4 medically necessary complex treatment devices were fabricated and supervised by me, in the form of 4 fields with MLCs to block parotids, esophagus, heart, lungs AND Aquaplast head and shoulder mask was custom made. Marland Kitchen MORE FIELDS WITH MLCs MAY BE ADDED IN DOSIMETRY for dose homogeneity.  I have requested : 3D Simulation  I have requested a DVH of the following structures: parotids, esophagus, heart, lungs, cord.    The patient will receive 20 Gy in 5 fractions to C6-T5 and 30 Gy in 10 fractions to C1-C5.  Patient will start treatment on 02-06-16; based on chronicity of symptoms, treatment is NOT emergent.   -----------------------------------  Eppie Gibson, MD

## 2016-02-02 NOTE — Progress Notes (Signed)
IP PROGRESS NOTE  Subjective:   Billy Lopez is a 75 year old gentleman I have seen in consultation this year for advanced prostate cancer. He had hormone sensitive disease and we have discussed adding additional therapy to his androgen deprivation. He has been receiving androgen deprivation therapy and his disease has been under reasonable control.  He presented on 02/01/2016 with lower extremity weakness and MRI of the thoracic spine showed significant cord compression at T2 level with abnormal cord signal. He was started on dexamethasone and was evaluated by Dr. Vertell Limber from neurosurgery. He reported that these symptoms have been going on for a few weeks and neurosurgical intervention with most likely be helpful. Patient is under evaluation for palliative radiation therapy.  Clinically, he feels relatively fair and appears not in any significant pain. His pain is reasonably controlled with Dilaudid. He reports no sensation in his lower extremities and unable to move.  Objective:  Vital signs in last 24 hours: Temp:  [97.7 F (36.5 C)-100 F (37.8 C)] 97.7 F (36.5 C) (09/01 0551) Pulse Rate:  [73-83] 79 (09/01 0551) Resp:  [14-18] 14 (09/01 0551) BP: (92-122)/(66-86) 92/67 (09/01 0551) SpO2:  [91 %-96 %] 96 % (09/01 0551) Weight:  [166 lb 7.2 oz (75.5 kg)] 166 lb 7.2 oz (75.5 kg) (09/01 0700) Weight change:  Last BM Date: 02/01/16  Intake/Output from previous day: 08/31 0701 - 09/01 0700 In: 912 [P.O.:355; I.V.:557] Out: 500 [Urine:500] Alert, awake gentleman appeared and no distress. Mouth: mucous membranes moist, pharynx normal without lesions no oral thrush noted. Resp: clear to auscultation bilaterally Cardio: regular rate and rhythm, S1, S2 normal, no murmur, click, rub or gallop GI: soft, non-tender; bowel sounds normal; no masses,  no organomegaly Extremities: extremities normal, atraumatic, no cyanosis or edema  Neurological: He has completed weakness in his lower  extremities elected sensation.  Results for EFRAM, ROATH (MRN [id number]) as of 02/02/2016 12:15  Ref. Range 01/16/2016 20:50 02/01/2016 16:07 02/02/2016 03:56  Alkaline Phosphatase Latest Ref Range: 38 - 126 U/L 2,448 (H) 1,719 (H) 1,745 (H)    Lab Results:  Recent Labs  02/02/16 0356 02/02/16 0956  WBC 6.3 5.8  HGB 9.9* 10.3*  HCT 30.6* 32.1*  PLT 200 213    BMET  Recent Labs  02/01/16 1607 02/02/16 0356  NA 134* 135  K 3.8 4.4  CL 108 111  CO2 21* 20*  GLUCOSE 107* 100*  BUN 35* 28*  CREATININE 1.03 0.93  CALCIUM 8.6* 8.6*   No current facility-administered medications on file prior to encounter.    Current Outpatient Prescriptions on File Prior to Encounter  Medication Sig Dispense Refill  . acetaminophen (TYLENOL) 325 MG tablet Take 2 tablets (650 mg total) by mouth every 6 (six) hours as needed for mild pain (or Fever >/= 101). 30 tablet 0  . bisacodyl (DULCOLAX) 5 MG EC tablet Take 1 tablet (5 mg total) by mouth daily as needed for moderate constipation. 30 tablet 0  . cholecalciferol (VITAMIN D) 1000 UNITS tablet Take 1,000 Units by mouth daily.     . DESCOVY 200-25 MG tablet TAKE 1 TABLET BY MOUTH AT BEDTIME. 30 tablet 6  . enalapril (VASOTEC) 20 MG tablet Take 20 mg by mouth daily.  0  . feeding supplement, ENSURE COMPLETE, (ENSURE COMPLETE) LIQD Take 237 mLs by mouth 2 (two) times daily between meals. 474 mL 11  . furosemide (LASIX) 20 MG tablet Take 20 mg by mouth daily.  0  . HYDROmorphone (DILAUDID)  2 MG tablet Take 1 tablet (2 mg total) by mouth every 6 (six) hours as needed for severe pain. (Patient taking differently: Take 2 mg by mouth every 4 (four) hours as needed for severe pain. ) 30 tablet 0  . potassium chloride (K-DUR) 10 MEQ tablet Take 1 tablet (10 mEq total) by mouth daily. 90 tablet 2  . tamsulosin (FLOMAX) 0.4 MG CAPS capsule Take 1 capsule (0.4 mg total) by mouth 2 (two) times daily. 60 capsule 5  . TIVICAY 50 MG tablet TAKE 1 TABLET (50  MG TOTAL) BY MOUTH DAILY. 30 tablet 6  . vitamin A 10000 UNIT capsule Take 10,000 Units by mouth every evening.    . vitamin C (ASCORBIC ACID) 500 MG tablet Take 500 mg by mouth every evening.      Studies/Results: Billy Lopez  Result Date: 02/02/2016 CLINICAL DATA:  Paraplegia with loss of sensation from the mid back downward. Symptoms began 1 week ago. EXAM: MRI THORACIC SPINE WITHOUT Lopez; MRI CERVICAL SPINE WITHOUT Lopez TECHNIQUE: Multiplanar and multiecho pulse sequences of the thoracic spine were obtained without intravenous Lopez.; Multiplanar and multiecho pulse sequences of the cervical spine, to include the craniocervical junction and cervicothoracic junction, were obtained according to standard protocol without intravenous Lopez. Lopez:  None. Lopez could not be given because patient received gadolinium 12 hours earlier for the lumbar MRI. COMPARISON:  Bone scan 04/13/2015. FINDINGS: CERVICAL: Alignment: Straightening of the normal cervical lordosis. Vertebrae: Widespread osseous metastatic disease. Endplate softening, due to a combination of degenerative disc disease and tumor, has resulted in mild diffuse loss of vertebral body height particularly from C3 through C6. No dominant compression fracture or retropulsed bony fragment. Metastatic disease is seen to the clivus. Cord: Mild cord flattening due to spondylosis at multiple levels described below. No definite epidural tumor in the cervical region. Posterior Fossa: Unremarkable. Vertebral Arteries: Patent. Paraspinal tissues: Unremarkable Disc levels: C2-3:  Annular bulge. C3-4: Central protrusion with stenosis. BILATERAL foraminal narrowing. Mild cord flattening. C4-5: Central protrusion with stenosis. BILATERAL foraminal narrowing. Mild cord flattening. C5-6: Annular bulge with osseous ridging. Central stenosis with BILATERAL foraminal narrowing. Moderate cord flattening. C6-7: Central protrusion with  stenosis. BILATERAL foraminal narrowing. Mild cord flattening. C7-T1:  Unremarkable. THORACIC: Numbering of the thoracic spine was accomplished by counting down the odontoid. Normal segmentation is present. Normal alignment. Widespread metastatic disease is seen in all thoracic vertebrae. Early loss of vertebral body height at T4 with slight retropulsion. Posterior element involvement most notable at T2. BILATERAL rib involvement most notable at T2, RIGHT greater than LEFT, T5, RIGHT greater than LEFT, along with BILATERAL pleural effusions representing metastatic disease outside the spinal axis. There is abnormal cord signal extending from C7-T1 through T2-T3 related to severe cord compression at the T2 level due to epidural tumor. The individual disc spaces are examined as follows: T1-2: Minor disc pathology. Epidural tumor surrounds the cord resulting in severe compression an abnormal central cord signal. A large deposit is present dorsally related to posterior element involvement. See image 9 series 12. T2-3: Minor disc pathology. Abnormal cord signal extends to the T2-3 interspace. Dorsal and ventral epidural tumor, greater on the RIGHT. T3-4:  RIGHT-sided epidural tumor without cord compression. T4-5: Pathologic T4 fracture. Epidural tumor extends ventrally and to the RIGHT resulting and stenosis and mild cord displacement. No abnormal cord signal. T5-6:  Unremarkable. T6-7:  Central and leftward protrusion.  No cord compression. T7-8:  Unremarkable disc space.  Early epidural tumor  on the LEFT. T8-9:  Annular bulge. T9-10:  Unremarkable. T10-11:  Central protrusion. T11-12:  Annular bulge. IMPRESSION: Widespread metastatic disease throughout the cervical and thoracic spine. Early pathologic compression at T4. Mild diffuse loss of cervical vertebrae height C3-C6. Significant cord compression due to epidural tumor at the T2 level, with abnormal cord signal. See discussion above. Extensive extra-spinal tumor,  involving the RIGHT ribs, greatest at T5. Critical Value/emergent results were called by telephone at the time of interpretation on 02/02/2016 at 7:45 AM to Dr. Posey Pronto, who verbally acknowledged these results. Electronically Signed   By: Staci Righter M.D.   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 Symptoms began 1 week ago. EXAM: MRI THORACIC SPINE WITHOUT Lopez; MRI CERVICAL SPINE WITHOUT Lopez TECHNIQUE: Multiplanar and multiecho pulse sequences of the thoracic spine were obtained without intravenous Lopez.; Multiplanar and multiecho pulse sequences of the cervical spine, to include the craniocervical junction and cervicothoracic junction, were obtained according to standard protocol without intravenous Lopez. Lopez:  None. Lopez could not be given because patient received gadolinium 12 hours earlier for the lumbar MRI. COMPARISON:  Bone scan 04/13/2015. FINDINGS: CERVICAL: Alignment: Straightening of the normal cervical lordosis. Vertebrae: Widespread osseous metastatic disease. Endplate softening, due to a combination of degenerative disc disease and tumor, has resulted in mild diffuse loss of vertebral body height particularly from C3 through C6. No dominant compression fracture or retropulsed bony fragment. Metastatic disease is seen to the clivus. Cord: Mild cord flattening due to spondylosis at multiple levels described below. No definite epidural tumor in the cervical region. Posterior Fossa: Unremarkable. Vertebral Arteries: Patent. Paraspinal tissues: Unremarkable Disc levels: C2-3:  Annular bulge. C3-4: Central protrusion with stenosis. BILATERAL foraminal narrowing. Mild cord flattening. C4-5: Central protrusion with stenosis. BILATERAL foraminal narrowing. Mild cord flattening. C5-6: Annular bulge with osseous ridging. Central stenosis with BILATERAL foraminal narrowing.  Moderate cord flattening. C6-7: Central protrusion with stenosis. BILATERAL foraminal narrowing. Mild cord flattening. C7-T1:  Unremarkable. THORACIC: Numbering of the thoracic spine was accomplished by counting down the odontoid. Normal segmentation is present. Normal alignment. Widespread metastatic disease is seen in all thoracic vertebrae. Early loss of vertebral body height at T4 with slight retropulsion. Posterior element involvement most notable at T2. BILATERAL rib involvement most notable at T2, RIGHT greater than LEFT, T5, RIGHT greater than LEFT, along with BILATERAL pleural effusions representing metastatic disease outside the spinal axis. There is abnormal cord signal extending from C7-T1 through T2-T3 related to severe cord compression at the T2 level due to epidural tumor. The individual disc spaces are examined as follows: T1-2: Minor disc pathology. Epidural tumor surrounds the cord resulting in severe compression an abnormal central cord signal. A large deposit is present dorsally related to posterior element involvement. See image 9 series 12. T2-3: Minor disc pathology. Abnormal cord signal extends to the T2-3 interspace. Dorsal and ventral epidural tumor, greater on the RIGHT. T3-4:  RIGHT-sided epidural tumor without cord compression. T4-5: Pathologic T4 fracture. Epidural tumor extends ventrally and to the RIGHT resulting and stenosis and mild cord displacement. No abnormal cord signal. T5-6:  Unremarkable. T6-7:  Central and leftward protrusion.  No cord compression. T7-8:  Unremarkable disc space.  Early epidural tumor on the LEFT. T8-9:  Annular bulge. T9-10:  Unremarkable. T10-11:  Central protrusion. T11-12:  Annular bulge. IMPRESSION: Widespread metastatic disease throughout the cervical and thoracic spine. Early pathologic  compression at T4. Mild diffuse loss of cervical vertebrae height C3-C6. Significant cord compression due to epidural tumor at the T2 level, with abnormal cord  signal. See discussion above. Extensive extra-spinal tumor, involving the RIGHT ribs, greatest at T5. Critical Value/emergent results were called by telephone at the time of interpretation on 02/02/2016 at 7:45 AM to Dr. Posey Pronto, who verbally acknowledged these results. Electronically Signed   By: Staci Righter M.D.   On: 02/02/2016 08:29   Billy Lopez  Result Date: 02/01/2016 CLINICAL DATA:  Prostate cancer with widespread bony metastatic disease. The patient is currently unable to move his legs. Weakness. EXAM: MRI LUMBAR SPINE WITHOUT AND WITH Lopez TECHNIQUE: Multiplanar and multiecho pulse sequences of the lumbar spine were obtained without and with intravenous Lopez. Lopez:  23mL MULTIHANCE GADOBENATE DIMEGLUMINE 529 MG/ML IV SOLN COMPARISON:  Multiple exams, including Pete/ 15/17 and 04/13/2015 FINDINGS: Segmentation: The lowest lumbar type non-rib-bearing vertebra is labeled as L5. Alignment: Dextroconvex lumbar scoliosis with significant rotary component. Vertebrae: Extensive marrow heterogeneity and patchy enhancement throughout the visualized lumbar spine and sacrum compatible with widespread osseous metastatic disease. This is probably superimposed on underlying chronic marrow heterogeneity. Most notable tumor involvement is in the spinous process of L2 where there is extraosseous extension of tumor suspected along the paraspinal tissues along with cortical demineralization of the L2 spinous process. Involvement results in a very small amount of epidural tumor along the posterior epidural space for example image 14/10. Disc desiccation and loss of disc height at all levels in the lumbar spine. 3 mm grade 1 anterolisthesis at L3-4. Conus medullaris: Extends to the L1 level and appears normal. Paraspinal and other soft tissues: Low-grade presacral edema, image 14/6. Low-level edema in the paraspinal musculature. Left periaortic node 1.2 cm in short axis on image 15/10. Disc  levels: T12-L1:  No impingement.  Diffuse disc bulge. L1-2: Mild displacement of the left L1 nerve in the lateral extraforaminal space and borderline left subarticular lateral recess stenosis due to left eccentric disc bulge and left lateral extraforaminal disc protrusion. Mild left facet arthropathy. L2-3: Mild left foraminal stenosis and mild displacement of the right L2 nerve in the lateral extraforaminal space due to disc bulge and facet arthropathy. L3-4: Moderate left foraminal stenosis and mild left subarticular lateral recess stenosis due to disc uncovering, intervertebral spurring, facet arthropathy, and disc bulge. L4-5: Moderate to prominent right and mild left foraminal stenosis due to disc bulge, facet arthropathy, and intervertebral spurring. L5-S1: Moderate bilateral foraminal stenosis and mild central narrowing of the thecal sac due to facet arthropathy, intervertebral spurring, and disc bulge. IMPRESSION: 1. Widespread osseous metastatic disease and resulting marrow heterogeneity and heterogeneous enhancement. The only area of apparent destructive metastatic disease is in the L2 spinous process were there some extraosseous extension of tumor and a very small amount of posterior epidural tumor. 2. Lumbar scoliosis, spondylosis, and degenerative disc disease, causing moderate to prominent impingement at L4-5 ; moderate impingement at L3-4 and L5-S1; and mild impingement at L1- 2 and L2- 3, as detailed above. Electronically Signed   By: Van Clines M.D.   On: 02/01/2016 19:22   Dg Chest Portable 1 View  Result Date: 02/01/2016 CLINICAL DATA:  Pt has a hx of metastatic prostate cancer who presents today with weakness and numbness in his legs. Hx of COPD, hypertension, pneumonia. EXAM: PORTABLE CHEST 1 VIEW COMPARISON:  01/16/2016 FINDINGS: Cardiac silhouette is normal in size. No mediastinal or hilar masses. There are irregularly  thickened interstitial markings bilaterally. Airspace opacity  is noted in the left mid lung and both lung bases. The findings may be stable from the prior study when allowing for differences in technique and lower lung volumes on the current exam. No convincing pleural effusion.  No pneumothorax. Multiple sclerotic bone lesions consistent with widespread metastatic prostate carcinoma. IMPRESSION: 1. Bilateral interstitial thickening with some hazy left mid and bilateral lower lung zone airspace opacities. Findings may be due to chronic interstitial thickening with areas of atelectasis. Pneumonia should be considered if there are consistent clinical symptoms. Findings accentuated by low lung volumes. No convincing pulmonary edema. Electronically Signed   By: Lajean Manes M.D.   On: 02/01/2016 20:09    Medications: I have reviewed the patient's current medications.  Assessment/Plan:   75 year old gentleman with the following issues:  1. Prostate cancer: He has a dating back to 2013 at that time he was treated with a definitive therapy with IMRT for a Gleason score 4+3 = 7. He developed advanced disease in 2016 with a PSA of 256. He has been on androgen deprivation under the care of Dr. Diona Fanti.  He developed worsening lower extremity weakness and loss of sensation an MRI of the cervical and thoracic spine confirmed the presence of progression of disease. His alkaline phosphatase noted to be elevated currently at 1700.  The natural course of this disease was reviewed again and he will require different salvage therapy in addition to his androgen deprivation. These therapies have been reviewed in the past with him including Zytiga versus systemic chemotherapy. He would be a reasonable candidate to start Zytiga salvage therapy which will be initiated as an outpatient setting. He understands that the goal of therapy at this time is palliative and it is reasonable to consider palliative systemic therapy at this time.  We will arrange follow-up at the [facility]  upon his discharge to initiate salvage therapy.  2. Thoracic cord compression: He'll receive palliative radiation therapy in addition to dexamethasone.  3. Disposition: He might need rehabilitation stay before discharge home given his lower extremity weakness and disability.   LOS: 0 days   [id number] 02/02/2016, 12:13 PM

## 2016-02-02 NOTE — Consult Note (Signed)
Radiation Oncology         [id number]) [phone] ________________________________  Inpatient Re-Consultation  Name: Billy Lopez. MRN: [id number]  Date: 02/01/2016  DOB: [birth year]  [id number] Keni, MD  No ref. provider found   REFERRING PHYSICIAN: Berle Mull, MD  DIAGNOSIS: C79.51, Metastatic prostate cancer diffusely through bones, prior T2a, adenocarcinoma of the prostate Gleason's 3+4 with pre-treatment PSA of 5.61, S/P radiotherapy to the prostate in 2013, with multiple bone mets and persistently elevated PSA, on Lupron  12/17/2011 through 02/13/2012: 78 Pearline Cables was delivered to the prostate in 40 fractions of 1.95 [place] ILLNESS::Billy Lopez. is a 75 y.o. male who returns today to discuss the role of palliative radiation in the management of his disease. The patient presented to the ED yesterday for weakness and numbness in his legs and severe pain with movement. He was subsequently admitted to the hospital. MRI of the lumbar spine yesterday showed destructive metastatic disease in the L2 spinous process were there some extraosseous extension of tumor and a very small amount of posterior epidural tumor. MRI of the cervical and thoracic spine today showed significant cord compression at T2 level with abnormal cord signal, a pathologic T4 fracture with epidural tumor extending ventrally and to the RIGHT resulting and stenosis and mild cord displacement. There is disease throughout the C-T spine.  Most notable areas of epidural tumor from T2-T4.  However, he reports diffuse back and neck pain.  He presents to discuss palliative radiation to his spine.  The patient states it has been at least  2 weeks since he was able to walk. He has lost sensation from his feet to his mid-upper chest. He has pain from his neck down. He is unaccompanied today. Dr Vertell Limber does not feel he is a surgical candidate.  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    PAST SURGICAL HISTORY: Past Surgical History:  Procedure Laterality Date  . AMPUTATION Right 03/31/2014   Procedure: RIGHT SECOND TOE AMPUTATION ;  Surgeon: Wylene Simmer, MD;  Location: [place];  Service: Orthopedics;  Laterality: Right;  . COLONOSCOPY N/A 11/17/2012   Procedure: COLONOSCOPY;  Surgeon: Ladene Artist, MD;  Location: [facility];  Service: Endoscopy;  Laterality: N/A;  possible APC  . FOOT SURGERY     bilat. ingrown toenails removed  . HAND SURGERY     right thumb  . HERNIA REPAIR    . HIP SURGERY     total bilateral replacement  . SHOULDER ARTHROSCOPY Left 06/15/2015   Procedure: ARTHROSCOPIC SHOULDER IRRIGATION AND DEBRIDEMENT;  Surgeon: Marchia Bond, MD;  Location: [place];  Service: Orthopedics;  Laterality: Left;  . TOTAL HIP REVISION Left 10/08/2014   Procedure: POSTERIOR LEFT TOTAL HIP REVISION;  Surgeon: Paralee Cancel, MD;  Location: [facility];  Service: Orthopedics;  Laterality: Left;    FAMILY HISTORY: family history includes  Alcohol abuse in his other; Arthritis in his other; Cancer in his mother; Hypertension in his other.  SOCIAL HISTORY:  reports that he quit smoking about  55 years ago. His smoking use included Cigarettes. He has a 10.00 pack-year smoking history. He has never used smokeless tobacco. He reports that he does not drink alcohol or use drugs.  ALLERGIES: Oxycodone  MEDICATIONS:  Current Facility-Administered Medications  Medication Dose Route Frequency Provider Last Rate Last Dose  . 0.9 %  sodium chloride infusion   Intravenous Continuous Rise Patience, MD 75 mL/hr at 02/02/16 1035    . acetaminophen (TYLENOL) tablet 650 mg  650 mg Oral Q6H PRN Rise Patience, MD       Or  . acetaminophen (TYLENOL) suppository 650 mg  650 mg Rectal Q6H PRN Rise Patience, MD      . bisacodyl (DULCOLAX) EC tablet 5 mg  5 mg Oral Daily PRN Rise Patience, MD      . bisacodyl (DULCOLAX) suppository 10 mg  10 mg Rectal Daily PRN Rise Patience, MD      . cholecalciferol (VITAMIN D) tablet 1,000 Units  1,000 Units Oral Daily Rise Patience, MD   1,000 Units at 02/02/16 1103  . dexamethasone (DECADRON) injection 8 mg  8 mg Intravenous Q6H Lavina Hamman, MD      . dolutegravir (TIVICAY) tablet 50 mg  50 mg Oral Daily Rise Patience, MD   50 mg at 02/01/16 2350  . emtricitabine-tenofovir AF (DESCOVY) 200-25 MG per tablet 1 tablet  1 tablet Oral QHS Rise Patience, MD   1 tablet at 02/01/16 2248  . enalapril (VASOTEC) tablet 20 mg  20 mg Oral Daily Rise Patience, MD   20 mg at 02/02/16 1103  . feeding supplement (ENSURE ENLIVE) (ENSURE ENLIVE) liquid 237 mL  237 mL Oral BID BM Rise Patience, MD      . fentaNYL (DURAGESIC - dosed mcg/hr) 50 mcg  50 mcg Transdermal Q72H Rise Patience, MD      . furosemide (LASIX) tablet 20 mg  20 mg Oral Daily Rise Patience, MD   20 mg at 02/02/16 1103  . HYDROcodone-acetaminophen (NORCO/VICODIN) 5-325 MG per tablet 1 tablet  1 tablet Oral Q6H PRN Rise Patience, MD   1 tablet at 02/02/16 0333  . HYDROmorphone (DILAUDID) injection 0.5-1 mg  0.5-1 mg Intravenous BID PRN  Lavina Hamman, MD   1 mg at 02/02/16 1235  . magnesium hydroxide (MILK OF MAGNESIA) suspension 30 mL  30 mL Oral Daily PRN Rise Patience, MD      . ondansetron [facility]) tablet 4 mg  4 mg Oral Q6H PRN Rise Patience, MD       Or  . ondansetron [facility]) injection 4 mg  4 mg Intravenous Q6H PRN Rise Patience, MD      . potassium chloride (K-DUR) CR tablet 10 mEq  10 mEq Oral Daily Rise Patience, MD   10 mEq at 02/02/16 1104  . senna-docusate (Senokot-S) tablet 1 tablet  1 tablet Oral Daily Rise Patience, MD   1 tablet at 02/02/16 1103  . vitamin A capsule 10,000 Units  10,000 Units Oral QPM Rise Patience, MD   10,000 Units at 02/01/16 2249  . vitamin C (ASCORBIC ACID) tablet 500 mg  500 mg Oral QPM Rise Patience, MD        REVIEW OF SYSTEMS:  Notable for  that above.   PHYSICAL EXAM:  height is 5\' 11"  (1.803 m) and weight is 166 lb 7.2 oz (75.5 kg). His oral temperature is 97.7 F (36.5 C). His blood pressure is 92/67 and his pulse is 79. His respiration is 14 and oxygen saturation is 96%.  Patient is alert and oriented. Lying on gurney   temperature is 97.9 F (36.6 C). His blood pressure is 93/77 and his pulse is 69.   General: Alert and oriented, in no acute distress HEENT: Head is normocephalic. Extraocular movements are intact. Oropharynx is clear, no oral thrush. Neck: The patient cannot lean forward, but does endorse pain to palpation in the neck and upper back. Extremities: No cyanosis. Mildly pitting edema in the bilateral feet Skin: No concerning lesions. Musculoskeletal: The patient is lying in a hospital bed. He has some sensation in his feet bilaterally. Mild sensation in his calves and knees bilaterally, but very diminished. Complete loss of motor strength in his lower extremities. 4/5 motor strength in his upper extremities. Sensation is grossly intact in his upper extremities. Underneath the level of his nipples, he has decreased sensation  throughout his body. Neurologic: Cranial nerves II through XII are grossly intact. Speech is fluent. See musculoskeletal exam. Psychiatric: Judgment and insight are intact. Affect is appropriate.  ECOG = 4  0 - Asymptomatic (Fully active, able to carry on all predisease activities without restriction)  1 - Symptomatic but completely ambulatory (Restricted in physically strenuous activity but ambulatory and able to carry out work of a light or sedentary nature. For example, light housework, office work)  2 - Symptomatic, <50% in bed during the day (Ambulatory and capable of all self care but unable to carry out any work activities. Up and about more than 50% of waking hours)  3 - Symptomatic, >50% in bed, but not bedbound (Capable of only limited self-care, confined to bed or chair 50% or more of waking hours)  4 - Bedbound (Completely disabled. Cannot carry on any self-care. Totally confined to bed or chair)  5 - Death   Eustace Pen MM, Creech RH, Tormey DC, et al. [phone]). "Toxicity and response criteria of the [facility]". [place] Oncol. 5 (6): 649-55   LABORATORY DATA:  Lab Results  Component Value Date   WBC 5.8 02/02/2016   HGB 10.3 (L) 02/02/2016   HCT 32.1 (L) 02/02/2016   MCV 84.3 02/02/2016   PLT 213 02/02/2016   CMP     Component Value Date/Time   NA 135 02/02/2016 0356   K 4.4 02/02/2016 0356   CL 111 02/02/2016 0356   CO2 20 (L) 02/02/2016 0356   GLUCOSE 100 (H) 02/02/2016 0356   BUN 28 (H) 02/02/2016 0356   CREATININE 0.93 02/02/2016 0356   CREATININE 1.24 (H) 06/14/2015 1019   CALCIUM 8.6 (L) 02/02/2016 0356   PROT 5.8 (L) 02/02/2016 0356   ALBUMIN 2.9 (L) 02/02/2016 0356   AST 46 (H) 02/02/2016 0356   ALT 16 (L) 02/02/2016 0356   ALKPHOS 1,745 (H) 02/02/2016 0356   BILITOT 0.3 02/02/2016 0356   GFRNONAA >60 02/02/2016 0356   GFRNONAA 58 (L) 06/14/2015 1019   GFRAA >60 02/02/2016 0356   GFRAA 67 06/14/2015 1019           RADIOGRAPHY: Dg Chest 2 View  Result Date: 01/16/2016 CLINICAL DATA:  Subacute onset of generalized chest pain, radiating to the back. Initial encounter. EXAM: CHEST  2 VIEW COMPARISON:  Chest radiograph performed 01/08/2016  FINDINGS: Mild bilateral linear airspace opacities may reflect pneumonia or possibly atelectasis. No pleural effusion or pneumothorax is seen. The heart is normal in size. No acute osseous abnormalities are identified. IMPRESSION: Mild bilateral linear opacities may reflect pneumonia or possibly atelectasis. Electronically Signed   By: Garald Balding M.D.   On: 01/16/2016 21:06   Dg Chest 2 View  Result Date: 01/08/2016 CLINICAL DATA:  Stabbing bilateral chest pain today. Shortness of breath 2 months. EXAM: CHEST  2 VIEW COMPARISON:  11/28/2015 FINDINGS: The cardiac silhouette, mediastinal and hilar contours are within normal limits and stable. There is tortuosity and calcification of the thoracic aorta. Underlying emphysematous changes and pulmonary scarring. Right lower lobe process appears to be a combination of atelectasis, infiltrate and small effusion. The bony thorax is intact. IMPRESSION: Right lower lobe process likely a combination of infiltrate, atelectasis and effusion. Electronically Signed   By: Marijo Sanes M.D.   On: 01/08/2016 21:02   Dg Lumbar Spine Complete  Result Date: 01/16/2016 CLINICAL DATA:  Subacute onset of lower back pain. Initial encounter. EXAM: LUMBAR SPINE - COMPLETE 4+ VIEW COMPARISON:  CT of the abdomen and pelvis from 04/13/2015 FINDINGS: There is no evidence of fracture or subluxation. Vertebral bodies demonstrate normal height. Degenerative disc space narrowing is noted along the lower lumbar spine, with scattered lateral osteophytes and endplate sclerotic change. Associated facet disease is noted. The visualized bowel gas pattern is unremarkable in appearance; air and stool are noted within the colon. The sacroiliac joints are within normal  limits. Bilateral hip arthroplasties are incompletely imaged but appear grossly unremarkable. IMPRESSION: No evidence of fracture or subluxation along the lumbar spine. Mild degenerative change along the lower lumbar spine. Electronically Signed   By: Garald Balding M.D.   On: 01/16/2016 21:22   Ct Angio Chest Pe W Or Wo Contrast  Result Date: 01/17/2016 CLINICAL DATA:  Chest pain and shortness of Breath EXAM: CT ANGIOGRAPHY CHEST WITH CONTRAST TECHNIQUE: Multidetector CT imaging of the chest was performed using the standard protocol during bolus administration of intravenous contrast. Multiplanar CT image reconstructions and MIPs were obtained to evaluate the vascular anatomy. CONTRAST:  100 mL Isovue 370 COMPARISON:  11/12/2015 FINDINGS: Mediastinum/Lymph Nodes: The thoracic inlet is within normal limits. No hilar or mediastinal adenopathy is identified. Cardiovascular: The thoracic aorta shows no evidence of aneurysmal dilatation or dissection. The pulmonary artery as visualized is somewhat limited by patient motion artifact although no gross central pulmonary embolus is noted. Lungs/Pleura: Again limited by patient respiratory artifact. Mild emphysematous changes are seen. Right lower lobe consolidation with associated small effusion is noted. No definitive parenchymal nodule are pneumothorax is seen. Upper abdomen: No acute findings. Musculoskeletal: Multiple lytic and sclerotic lesions are noted throughout the bony structures consistent with the given clinical history of metastatic disease. These appear slightly improved when compared with the prior exam consistent with therapy Review of the MIP images confirms the above findings. IMPRESSION: No evidence of pulmonary emboli although the examination is somewhat limited by patient motion artifact. Right lower lobe infiltrate with associated small effusion. Changes of metastatic disease to the bones with some improvement when compared with the prior exam  consistent with therapy. Electronically Signed   By: Inez Catalina M.D.   On: 01/17/2016 11:46   Mr Cervical Spine Wo Contrast  Result Date: 02/02/2016 CLINICAL DATA:  Paraplegia with loss of sensation from the mid back downward. Symptoms began 1 week ago. EXAM: MRI THORACIC SPINE WITHOUT CONTRAST; MRI CERVICAL SPINE  WITHOUT CONTRAST TECHNIQUE: Multiplanar and multiecho pulse sequences of the thoracic spine were obtained without intravenous contrast.; Multiplanar and multiecho pulse sequences of the cervical spine, to include the craniocervical junction and cervicothoracic junction, were obtained according to standard protocol without intravenous contrast. CONTRAST:  None. Contrast could not be given because patient received gadolinium 12 hours earlier for the lumbar MRI. COMPARISON:  Bone scan 04/13/2015. FINDINGS: CERVICAL: Alignment: Straightening of the normal cervical lordosis. Vertebrae: Widespread osseous metastatic disease. Endplate softening, due to a combination of degenerative disc disease and tumor, has resulted in mild diffuse loss of vertebral body height particularly from C3 through C6. No dominant compression fracture or retropulsed bony fragment. Metastatic disease is seen to the clivus. Cord: Mild cord flattening due to spondylosis at multiple levels described below. No definite epidural tumor in the cervical region. Posterior Fossa: Unremarkable. Vertebral Arteries: Patent. Paraspinal tissues: Unremarkable Disc levels: C2-3:  Annular bulge. C3-4: Central protrusion with stenosis. BILATERAL foraminal narrowing. Mild cord flattening. C4-5: Central protrusion with stenosis. BILATERAL foraminal narrowing. Mild cord flattening. C5-6: Annular bulge with osseous ridging. Central stenosis with BILATERAL foraminal narrowing. Moderate cord flattening. C6-7: Central protrusion with stenosis. BILATERAL foraminal narrowing. Mild cord flattening. C7-T1:  Unremarkable. THORACIC: Numbering of the thoracic spine  was accomplished by counting down the odontoid. Normal segmentation is present. Normal alignment. Widespread metastatic disease is seen in all thoracic vertebrae. Early loss of vertebral body height at T4 with slight retropulsion. Posterior element involvement most notable at T2. BILATERAL rib involvement most notable at T2, RIGHT greater than LEFT, T5, RIGHT greater than LEFT, along with BILATERAL pleural effusions representing metastatic disease outside the spinal axis. There is abnormal cord signal extending from C7-T1 through T2-T3 related to severe cord compression at the T2 level due to epidural tumor. The individual disc spaces are examined as follows: T1-2: Minor disc pathology. Epidural tumor surrounds the cord resulting in severe compression an abnormal central cord signal. A large deposit is present dorsally related to posterior element involvement. See image 9 series 12. T2-3: Minor disc pathology. Abnormal cord signal extends to the T2-3 interspace. Dorsal and ventral epidural tumor, greater on the RIGHT. T3-4:  RIGHT-sided epidural tumor without cord compression. T4-5: Pathologic T4 fracture. Epidural tumor extends ventrally and to the RIGHT resulting and stenosis and mild cord displacement. No abnormal cord signal. T5-6:  Unremarkable. T6-7:  Central and leftward protrusion.  No cord compression. T7-8:  Unremarkable disc space.  Early epidural tumor on the LEFT. T8-9:  Annular bulge. T9-10:  Unremarkable. T10-11:  Central protrusion. T11-12:  Annular bulge. IMPRESSION: Widespread metastatic disease throughout the cervical and thoracic spine. Early pathologic compression at T4. Mild diffuse loss of cervical vertebrae height C3-C6. Significant cord compression due to epidural tumor at the T2 level, with abnormal cord signal. See discussion above. Extensive extra-spinal tumor, involving the RIGHT ribs, greatest at T5. Critical Value/emergent results were called by telephone at the time of interpretation  on 02/02/2016 at 7:45 AM to Dr. Posey Pronto, who verbally acknowledged these results. Electronically Signed   By: Staci Righter M.D.   On: 02/02/2016 08:29   Mr Thoracic Spine Wo Contrast  Result Date: 02/02/2016 CLINICAL DATA:  Paraplegia with loss of sensation from the mid back downward. Symptoms began 1 week ago. EXAM: MRI THORACIC SPINE WITHOUT CONTRAST; MRI CERVICAL SPINE WITHOUT CONTRAST TECHNIQUE: Multiplanar and multiecho pulse sequences of the thoracic spine were obtained without intravenous contrast.; Multiplanar and multiecho pulse sequences of the cervical spine, to include the craniocervical  junction and cervicothoracic junction, were obtained according to standard protocol without intravenous contrast. CONTRAST:  None. Contrast could not be given because patient received gadolinium 12 hours earlier for the lumbar MRI. COMPARISON:  Bone scan 04/13/2015. FINDINGS: CERVICAL: Alignment: Straightening of the normal cervical lordosis. Vertebrae: Widespread osseous metastatic disease. Endplate softening, due to a combination of degenerative disc disease and tumor, has resulted in mild diffuse loss of vertebral body height particularly from C3 through C6. No dominant compression fracture or retropulsed bony fragment. Metastatic disease is seen to the clivus. Cord: Mild cord flattening due to spondylosis at multiple levels described below. No definite epidural tumor in the cervical region. Posterior Fossa: Unremarkable. Vertebral Arteries: Patent. Paraspinal tissues: Unremarkable Disc levels: C2-3:  Annular bulge. C3-4: Central protrusion with stenosis. BILATERAL foraminal narrowing. Mild cord flattening. C4-5: Central protrusion with stenosis. BILATERAL foraminal narrowing. Mild cord flattening. C5-6: Annular bulge with osseous ridging. Central stenosis with BILATERAL foraminal narrowing. Moderate cord flattening. C6-7: Central protrusion with stenosis. BILATERAL foraminal narrowing. Mild cord flattening. C7-T1:   Unremarkable. THORACIC: Numbering of the thoracic spine was accomplished by counting down the odontoid. Normal segmentation is present. Normal alignment. Widespread metastatic disease is seen in all thoracic vertebrae. Early loss of vertebral body height at T4 with slight retropulsion. Posterior element involvement most notable at T2. BILATERAL rib involvement most notable at T2, RIGHT greater than LEFT, T5, RIGHT greater than LEFT, along with BILATERAL pleural effusions representing metastatic disease outside the spinal axis. There is abnormal cord signal extending from C7-T1 through T2-T3 related to severe cord compression at the T2 level due to epidural tumor. The individual disc spaces are examined as follows: T1-2: Minor disc pathology. Epidural tumor surrounds the cord resulting in severe compression an abnormal central cord signal. A large deposit is present dorsally related to posterior element involvement. See image 9 series 12. T2-3: Minor disc pathology. Abnormal cord signal extends to the T2-3 interspace. Dorsal and ventral epidural tumor, greater on the RIGHT. T3-4:  RIGHT-sided epidural tumor without cord compression. T4-5: Pathologic T4 fracture. Epidural tumor extends ventrally and to the RIGHT resulting and stenosis and mild cord displacement. No abnormal cord signal. T5-6:  Unremarkable. T6-7:  Central and leftward protrusion.  No cord compression. T7-8:  Unremarkable disc space.  Early epidural tumor on the LEFT. T8-9:  Annular bulge. T9-10:  Unremarkable. T10-11:  Central protrusion. T11-12:  Annular bulge. IMPRESSION: Widespread metastatic disease throughout the cervical and thoracic spine. Early pathologic compression at T4. Mild diffuse loss of cervical vertebrae height C3-C6. Significant cord compression due to epidural tumor at the T2 level, with abnormal cord signal. See discussion above. Extensive extra-spinal tumor, involving the RIGHT ribs, greatest at T5. Critical Value/emergent results  were called by telephone at the time of interpretation on 02/02/2016 at 7:45 AM to Dr. Posey Pronto, who verbally acknowledged these results. Electronically Signed   By: Staci Righter M.D.   On: 02/02/2016 08:29   Mr Lumbar Spine W Wo Contrast  Result Date: 02/01/2016 CLINICAL DATA:  Prostate cancer with widespread bony metastatic disease. The patient is currently unable to move his legs. Weakness. EXAM: MRI LUMBAR SPINE WITHOUT AND WITH CONTRAST TECHNIQUE: Multiplanar and multiecho pulse sequences of the lumbar spine were obtained without and with intravenous contrast. CONTRAST:  97mL MULTIHANCE GADOBENATE DIMEGLUMINE 529 MG/ML IV SOLN COMPARISON:  Multiple exams, including Pete/ 15/17 and 04/13/2015 FINDINGS: Segmentation: The lowest lumbar type non-rib-bearing vertebra is labeled as L5. Alignment: Dextroconvex lumbar scoliosis with significant rotary component. Vertebrae: Extensive  marrow heterogeneity and patchy enhancement throughout the visualized lumbar spine and sacrum compatible with widespread osseous metastatic disease. This is probably superimposed on underlying chronic marrow heterogeneity. Most notable tumor involvement is in the spinous process of L2 where there is extraosseous extension of tumor suspected along the paraspinal tissues along with cortical demineralization of the L2 spinous process. Involvement results in a very small amount of epidural tumor along the posterior epidural space for example image 14/10. Disc desiccation and loss of disc height at all levels in the lumbar spine. 3 mm grade 1 anterolisthesis at L3-4. Conus medullaris: Extends to the L1 level and appears normal. Paraspinal and other soft tissues: Low-grade presacral edema, image 14/6. Low-level edema in the paraspinal musculature. Left periaortic node 1.2 cm in short axis on image 15/10. Disc levels: T12-L1:  No impingement.  Diffuse disc bulge. L1-2: Mild displacement of the left L1 nerve in the lateral extraforaminal space and  borderline left subarticular lateral recess stenosis due to left eccentric disc bulge and left lateral extraforaminal disc protrusion. Mild left facet arthropathy. L2-3: Mild left foraminal stenosis and mild displacement of the right L2 nerve in the lateral extraforaminal space due to disc bulge and facet arthropathy. L3-4: Moderate left foraminal stenosis and mild left subarticular lateral recess stenosis due to disc uncovering, intervertebral spurring, facet arthropathy, and disc bulge. L4-5: Moderate to prominent right and mild left foraminal stenosis due to disc bulge, facet arthropathy, and intervertebral spurring. L5-S1: Moderate bilateral foraminal stenosis and mild central narrowing of the thecal sac due to facet arthropathy, intervertebral spurring, and disc bulge. IMPRESSION: 1. Widespread osseous metastatic disease and resulting marrow heterogeneity and heterogeneous enhancement. The only area of apparent destructive metastatic disease is in the L2 spinous process were there some extraosseous extension of tumor and a very small amount of posterior epidural tumor. 2. Lumbar scoliosis, spondylosis, and degenerative disc disease, causing moderate to prominent impingement at L4-5 ; moderate impingement at L3-4 and L5-S1; and mild impingement at L1- 2 and L2- 3, as detailed above. Electronically Signed   By: Van Clines M.D.   On: 02/01/2016 19:22   Dg Chest Portable 1 View  Result Date: 02/01/2016 CLINICAL DATA:  Pt has a hx of metastatic prostate cancer who presents today with weakness and numbness in his legs. Hx of COPD, hypertension, pneumonia. EXAM: PORTABLE CHEST 1 VIEW COMPARISON:  01/16/2016 FINDINGS: Cardiac silhouette is normal in size. No mediastinal or hilar masses. There are irregularly thickened interstitial markings bilaterally. Airspace opacity is noted in the left mid lung and both lung bases. The findings may be stable from the prior study when allowing for differences in  technique and lower lung volumes on the current exam. No convincing pleural effusion.  No pneumothorax. Multiple sclerotic bone lesions consistent with widespread metastatic prostate carcinoma. IMPRESSION: 1. Bilateral interstitial thickening with some hazy left mid and bilateral lower lung zone airspace opacities. Findings may be due to chronic interstitial thickening with areas of atelectasis. Pneumonia should be considered if there are consistent clinical symptoms. Findings accentuated by low lung volumes. No convincing pulmonary edema. Electronically Signed   By: Lajean Manes M.D.   On: 02/01/2016 20:09      IMPRESSION/PLAN: Today, I talked to the patient about the findings and work-up thus far. 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 We discussed the available radiation  techniques, and focused on the details of logistics and delivery.   We discussed the risks, benefits, and side effects of radiotherapy. A consent form was signed and placed in the patient's medical record. The patient was encouraged to ask questions that I answered to the best of my ability.  CT simulation is scheduled today at [facility] and treatment to start next week. Realistically, radiotherapy will not restore neurological function given the chronicity of his symptoms.  I will treat the C spine and upper T spine with the goal of pain palliation.  .__________________________________________   Eppie Gibson, MD

## 2016-02-02 NOTE — Consult Note (Signed)
Reason for Consult:paraplegia with prostate cancer Referring Physician: Dr. Leighton Ruff Billy Arch. is an 75 y.o. male.  HPI: Patient has had progressive back pain and weakness over the last month.  Admitted to [facility] with inability to move lower extremities over the last month.  Per Hospitalist,  He is and has been unable to move legs at all over the last several weeks.  MRI shows severe cord compression at T 2 with dorsal and ventral cord compression with increased  Cord signal in spinal cord.  Patient has hormone responsive prostate cancer.  Per recent exam, patient has no lower extremity motor function, incontinent of urine, some preservation of proprioception in feet, hyperreflexic in lower extremities.  Past Medical History:  Diagnosis Date  . Allergic rhinitis, cause unspecified 07/21/2011  . Cervical spondylosis 07/21/2011  . CKD (chronic kidney disease) stage 3, GFR 30-59 ml/min 06/28/2015  . COPD (chronic obstructive pulmonary disease) ([place]) 07/21/2011  . GERD (gastroesophageal reflux disease)   . H/O blood clots   . 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 Hepatitis    Hep C  . 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 HIV infection ([place])   . Hx of radiation therapy 12/17/11 - 02/13/12   prostate  . Hypertension   . Lumbar spondylosis 07/21/2011  . Osteoarthritis 07/21/2011  . Pneumonia   . Prostate cancer ([place]) 09/19/11   gleason 7  . Prostate cancer metastatic to bone ([place]) 08/30/2015  . Rectal bleeding   . S/P radiation therapy 12/17/2011 through 02/13/2012   78 gray was delivered to the prostate in 40 fractions of 1.95 gray  . Septic joint of left shoulder region ([place]) 06/15/2015  . Substance abuse   . Wears dentures    top  . Wears glasses     Past Surgical History:  Procedure Laterality Date  . AMPUTATION Right 03/31/2014   Procedure: RIGHT SECOND TOE AMPUTATION ;  Surgeon: Wylene Simmer, MD;  Location: [place];  Service: Orthopedics;  Laterality: Right;  .  COLONOSCOPY N/A 11/17/2012   Procedure: COLONOSCOPY;  Surgeon: Ladene Artist, MD;  Location: [facility];  Service: Endoscopy;  Laterality: N/A;  possible APC  . FOOT SURGERY     bilat. ingrown toenails removed  . HAND SURGERY     right thumb  . HERNIA REPAIR    . HIP SURGERY     total bilateral replacement  . SHOULDER ARTHROSCOPY Left 06/15/2015   Procedure: ARTHROSCOPIC SHOULDER IRRIGATION AND DEBRIDEMENT;  Surgeon: Marchia Bond, MD;  Location: [place];  Service: Orthopedics;  Laterality: Left;  . TOTAL HIP REVISION Left 10/08/2014   Procedure: POSTERIOR LEFT TOTAL HIP REVISION;  Surgeon: Paralee Cancel, MD;  Location: [facility];  Service: Orthopedics;  Laterality: Left;    Family History  Problem Relation Age of Onset  . Cancer Mother     unsure of type  . Alcohol abuse Other   . Arthritis Other   . Hypertension Other   . Colon cancer Neg Hx   . Diabetes Neg Hx     Social History:  reports that he quit smoking about 55 years ago. His smoking use included Cigarettes. He has a 10.00 pack-year smoking history. He has never used smokeless tobacco. He reports that he does not drink alcohol or use drugs.  Allergies:  Allergies  Allergen Reactions  . Oxycodone Other (See Comments)    loopiness    Medications: I have reviewed the patient's current medications.  Results for orders  placed or performed during the hospital encounter of 02/01/16 (from the past 48 hour(s))  CBC with Differential     Status: Abnormal   Collection Time: 02/01/16  4:07 PM  Result Value Ref Range   WBC 5.7 4.0 - 10.5 K/uL   RBC 3.89 (L) 4.22 - 5.81 MIL/uL   Hemoglobin 10.5 (L) 13.0 - 17.0 g/dL   HCT 32.7 (L) 39.0 - 52.0 %   MCV 84.1 78.0 - 100.0 fL   MCH 27.0 26.0 - 34.0 pg   MCHC 32.1 30.0 - 36.0 g/dL   RDW 16.2 (H) 11.5 - 15.5 %   Platelets 218 150 - 400 K/uL   Neutrophils Relative % 65 %   Neutro Abs 3.8 1.7 - 7.7 K/uL   Lymphocytes Relative 18 %   Lymphs Abs 1.0 0.7 - 4.0 K/uL   Monocytes Relative  14 %   Monocytes Absolute 0.8 0.1 - 1.0 K/uL   Eosinophils Relative 2 %   Eosinophils Absolute 0.1 0.0 - 0.7 K/uL   Basophils Relative 1 %   Basophils Absolute 0.0 0.0 - 0.1 K/uL  Comprehensive metabolic panel     Status: Abnormal   Collection Time: 02/01/16  4:07 PM  Result Value Ref Range   Sodium 134 (L) 135 - 145 mmol/L   Potassium 3.8 3.5 - 5.1 mmol/L   Chloride 108 101 - 111 mmol/L   CO2 21 (L) 22 - 32 mmol/L   Glucose, Bld 107 (H) 65 - 99 mg/dL   BUN 35 (H) 6 - 20 mg/dL   Creatinine, Ser 1.03 0.61 - 1.24 mg/dL   Calcium 8.6 (L) 8.9 - 10.3 mg/dL   Total Protein 6.2 (L) 6.5 - 8.1 g/dL   Albumin 3.2 (L) 3.5 - 5.0 g/dL   AST 60 (H) 15 - 41 U/L   ALT 16 (L) 17 - 63 U/L   Alkaline Phosphatase 1,719 (H) 38 - 126 U/L    Comment: RESULTS CONFIRMED BY MANUAL DILUTION   Total Bilirubin 0.3 0.3 - 1.2 mg/dL   GFR calc non Af Amer >60 >60 mL/min   GFR calc Af Amer >60 >60 mL/min    Comment: (NOTE) The eGFR has been calculated using the CKD EPI equation. This calculation has not been validated in all clinical situations. eGFR's persistently <60 mL/min signify possible Chronic Kidney Disease.    Anion gap 5 5 - 15  Troponin I     Status: None   Collection Time: 02/01/16  4:07 PM  Result Value Ref Range   Troponin I <0.03 <0.03 ng/mL  POC occult blood, ED RN will collect     Status: Abnormal   Collection Time: 02/01/16  5:35 PM  Result Value Ref Range   Fecal Occult Bld POSITIVE (A) NEGATIVE  CBC     Status: Abnormal   Collection Time: 02/01/16 10:48 PM  Result Value Ref Range   WBC 6.5 4.0 - 10.5 K/uL   RBC 4.06 (L) 4.22 - 5.81 MIL/uL   Hemoglobin 10.7 (L) 13.0 - 17.0 g/dL   HCT 33.2 (L) 39.0 - 52.0 %   MCV 81.8 78.0 - 100.0 fL   MCH 26.4 26.0 - 34.0 pg   MCHC 32.2 30.0 - 36.0 g/dL   RDW 16.0 (H) 11.5 - 15.5 %   Platelets 212 150 - 400 K/uL  Urinalysis, Routine w reflex microscopic     Status: None   Collection Time: 02/02/16  1:04 AM  Result Value Ref Range   Color,  Urine YELLOW YELLOW   APPearance CLEAR CLEAR   Specific Gravity, Urine 1.021 1.005 - 1.030   pH 6.0 5.0 - 8.0   Glucose, UA NEGATIVE NEGATIVE mg/dL   Hgb urine dipstick NEGATIVE NEGATIVE   Bilirubin Urine NEGATIVE NEGATIVE   Ketones, ur NEGATIVE NEGATIVE mg/dL   Protein, ur NEGATIVE NEGATIVE mg/dL   Nitrite NEGATIVE NEGATIVE   Leukocytes, UA NEGATIVE NEGATIVE    Comment: MICROSCOPIC NOT DONE ON URINES WITH NEGATIVE PROTEIN, BLOOD, LEUKOCYTES, NITRITE, OR GLUCOSE <1000 mg/dL.  Comprehensive metabolic panel     Status: Abnormal   Collection Time: 02/02/16  3:56 AM  Result Value Ref Range   Sodium 135 135 - 145 mmol/L   Potassium 4.4 3.5 - 5.1 mmol/L   Chloride 111 101 - 111 mmol/L   CO2 20 (L) 22 - 32 mmol/L   Glucose, Bld 100 (H) 65 - 99 mg/dL   BUN 28 (H) 6 - 20 mg/dL   Creatinine, Ser 4.64 0.61 - 1.24 mg/dL   Calcium 8.6 (L) 8.9 - 10.3 mg/dL   Total Protein 5.8 (L) 6.5 - 8.1 g/dL   Albumin 2.9 (L) 3.5 - 5.0 g/dL   AST 46 (H) 15 - 41 U/L   ALT 16 (L) 17 - 63 U/L   Alkaline Phosphatase 1,745 (H) 38 - 126 U/L    Comment: RESULTS CONFIRMED BY MANUAL DILUTION   Total Bilirubin 0.3 0.3 - 1.2 mg/dL   GFR calc non Af Amer >60 >60 mL/min   GFR calc Af Amer >60 >60 mL/min    Comment: (NOTE) The eGFR has been calculated using the CKD EPI equation. This calculation has not been validated in all clinical situations. eGFR's persistently <60 mL/min signify possible Chronic Kidney Disease.    Anion gap 4 (L) 5 - 15  CBC WITH DIFFERENTIAL     Status: Abnormal   Collection Time: 02/02/16  3:56 AM  Result Value Ref Range   WBC 6.3 4.0 - 10.5 K/uL   RBC 3.65 (L) 4.22 - 5.81 MIL/uL   Hemoglobin 9.9 (L) 13.0 - 17.0 g/dL   HCT 86.9 (L) 61.3 - 48.2 %   MCV 83.8 78.0 - 100.0 fL   MCH 27.1 26.0 - 34.0 pg   MCHC 32.4 30.0 - 36.0 g/dL   RDW 77.9 (H) 85.9 - 21.0 %   Platelets 200 150 - 400 K/uL   Neutrophils Relative % 62 %   Neutro Abs 4.0 1.7 - 7.7 K/uL   Lymphocytes Relative 20 %    Lymphs Abs 1.3 0.7 - 4.0 K/uL   Monocytes Relative 15 %   Monocytes Absolute 0.9 0.1 - 1.0 K/uL   Eosinophils Relative 2 %   Eosinophils Absolute 0.1 0.0 - 0.7 K/uL   Basophils Relative 1 %   Basophils Absolute 0.0 0.0 - 0.1 K/uL    Mr Cervical Spine Wo Contrast  Result Date: 02/02/2016 CLINICAL DATA:  Paraplegia with loss of sensation from the mid back downward. Symptoms began 1 week ago. EXAM: MRI THORACIC SPINE WITHOUT CONTRAST; MRI CERVICAL SPINE WITHOUT CONTRAST TECHNIQUE: Multiplanar and multiecho pulse sequences of the thoracic spine were obtained without intravenous contrast.; Multiplanar and multiecho pulse sequences of the cervical spine, to include the craniocervical junction and cervicothoracic junction, were obtained according to standard protocol without intravenous contrast. CONTRAST:  None. Contrast could not be given because patient received gadolinium 12 hours earlier for the lumbar MRI. COMPARISON:  Bone scan 04/13/2015. FINDINGS: CERVICAL: Alignment: Straightening of the normal cervical lordosis.  Vertebrae: Widespread osseous metastatic disease. Endplate softening, due to a combination of degenerative disc disease and tumor, has resulted in mild diffuse loss of vertebral body height particularly from C3 through C6. No dominant compression fracture or retropulsed bony fragment. Metastatic disease is seen to the clivus. Cord: Mild cord flattening due to spondylosis at multiple levels described below. No definite epidural tumor in the cervical region. Posterior Fossa: Unremarkable. Vertebral Arteries: Patent. Paraspinal tissues: Unremarkable Disc levels: C2-3:  Annular bulge. C3-4: Central protrusion with stenosis. BILATERAL foraminal narrowing. Mild cord flattening. C4-5: Central protrusion with stenosis. BILATERAL foraminal narrowing. Mild cord flattening. C5-6: Annular bulge with osseous ridging. Central stenosis with BILATERAL foraminal narrowing. Moderate cord flattening. C6-7:  Central protrusion with stenosis. BILATERAL foraminal narrowing. Mild cord flattening. C7-T1:  Unremarkable. THORACIC: Numbering of the thoracic spine was accomplished by counting down the odontoid. Normal segmentation is present. Normal alignment. Widespread metastatic disease is seen in all thoracic vertebrae. Early loss of vertebral body height at T4 with slight retropulsion. Posterior element involvement most notable at T2. BILATERAL rib involvement most notable at T2, RIGHT greater than LEFT, T5, RIGHT greater than LEFT, along with BILATERAL pleural effusions representing metastatic disease outside the spinal axis. There is abnormal cord signal extending from C7-T1 through T2-T3 related to severe cord compression at the T2 level due to epidural tumor. The individual disc spaces are examined as follows: T1-2: Minor disc pathology. Epidural tumor surrounds the cord resulting in severe compression an abnormal central cord signal. A large deposit is present dorsally related to posterior element involvement. See image 9 series 12. T2-3: Minor disc pathology. Abnormal cord signal extends to the T2-3 interspace. Dorsal and ventral epidural tumor, greater on the RIGHT. T3-4:  RIGHT-sided epidural tumor without cord compression. T4-5: Pathologic T4 fracture. Epidural tumor extends ventrally and to the RIGHT resulting and stenosis and mild cord displacement. No abnormal cord signal. T5-6:  Unremarkable. T6-7:  Central and leftward protrusion.  No cord compression. T7-8:  Unremarkable disc space.  Early epidural tumor on the LEFT. T8-9:  Annular bulge. T9-10:  Unremarkable. T10-11:  Central protrusion. T11-12:  Annular bulge. IMPRESSION: Widespread metastatic disease throughout the cervical and thoracic spine. Early pathologic compression at T4. Mild diffuse loss of cervical vertebrae height C3-C6. Significant cord compression due to epidural tumor at the T2 level, with abnormal cord signal. See discussion above.  Extensive extra-spinal tumor, involving the RIGHT ribs, greatest at T5. Critical Value/emergent results were called by telephone at the time of interpretation on 02/02/2016 at 7:45 AM to Dr. Posey Pronto, who verbally acknowledged these results. Electronically Signed   By: Staci Righter M.D.   On: 02/02/2016 08:29   Mr Thoracic Spine Wo Contrast  Result Date: 02/02/2016 CLINICAL DATA:  Paraplegia with loss of sensation from the mid back downward. Symptoms began 1 week ago. EXAM: MRI THORACIC SPINE WITHOUT CONTRAST; MRI CERVICAL SPINE WITHOUT CONTRAST TECHNIQUE: Multiplanar and multiecho pulse sequences of the thoracic spine were obtained without intravenous contrast.; Multiplanar and multiecho pulse sequences of the cervical spine, to include the craniocervical junction and cervicothoracic junction, were obtained according to standard protocol without intravenous contrast. CONTRAST:  None. Contrast could not be given because patient received gadolinium 12 hours earlier for the lumbar MRI. COMPARISON:  Bone scan 04/13/2015. FINDINGS: CERVICAL: Alignment: Straightening of the normal cervical lordosis. Vertebrae: Widespread osseous metastatic disease. Endplate softening, due to a combination of degenerative disc disease and tumor, has resulted in mild diffuse loss of vertebral body height particularly from C3  through C6. No dominant compression fracture or retropulsed bony fragment. Metastatic disease is seen to the clivus. Cord: Mild cord flattening due to spondylosis at multiple levels described below. No definite epidural tumor in the cervical region. Posterior Fossa: Unremarkable. Vertebral Arteries: Patent. Paraspinal tissues: Unremarkable Disc levels: C2-3:  Annular bulge. C3-4: Central protrusion with stenosis. BILATERAL foraminal narrowing. Mild cord flattening. C4-5: Central protrusion with stenosis. BILATERAL foraminal narrowing. Mild cord flattening. C5-6: Annular bulge with osseous ridging. Central stenosis with  BILATERAL foraminal narrowing. Moderate cord flattening. C6-7: Central protrusion with stenosis. BILATERAL foraminal narrowing. Mild cord flattening. C7-T1:  Unremarkable. THORACIC: Numbering of the thoracic spine was accomplished by counting down the odontoid. Normal segmentation is present. Normal alignment. Widespread metastatic disease is seen in all thoracic vertebrae. Early loss of vertebral body height at T4 with slight retropulsion. Posterior element involvement most notable at T2. BILATERAL rib involvement most notable at T2, RIGHT greater than LEFT, T5, RIGHT greater than LEFT, along with BILATERAL pleural effusions representing metastatic disease outside the spinal axis. There is abnormal cord signal extending from C7-T1 through T2-T3 related to severe cord compression at the T2 level due to epidural tumor. The individual disc spaces are examined as follows: T1-2: Minor disc pathology. Epidural tumor surrounds the cord resulting in severe compression an abnormal central cord signal. A large deposit is present dorsally related to posterior element involvement. See image 9 series 12. T2-3: Minor disc pathology. Abnormal cord signal extends to the T2-3 interspace. Dorsal and ventral epidural tumor, greater on the RIGHT. T3-4:  RIGHT-sided epidural tumor without cord compression. T4-5: Pathologic T4 fracture. Epidural tumor extends ventrally and to the RIGHT resulting and stenosis and mild cord displacement. No abnormal cord signal. T5-6:  Unremarkable. T6-7:  Central and leftward protrusion.  No cord compression. T7-8:  Unremarkable disc space.  Early epidural tumor on the LEFT. T8-9:  Annular bulge. T9-10:  Unremarkable. T10-11:  Central protrusion. T11-12:  Annular bulge. IMPRESSION: Widespread metastatic disease throughout the cervical and thoracic spine. Early pathologic compression at T4. Mild diffuse loss of cervical vertebrae height C3-C6. Significant cord compression due to epidural tumor at the T2  level, with abnormal cord signal. See discussion above. Extensive extra-spinal tumor, involving the RIGHT ribs, greatest at T5. Critical Value/emergent results were called by telephone at the time of interpretation on 02/02/2016 at 7:45 AM to Dr. Posey Pronto, who verbally acknowledged these results. Electronically Signed   By: Staci Righter M.D.   On: 02/02/2016 08:29   Mr Lumbar Spine W Wo Contrast  Result Date: 02/01/2016 CLINICAL DATA:  Prostate cancer with widespread bony metastatic disease. The patient is currently unable to move his legs. Weakness. EXAM: MRI LUMBAR SPINE WITHOUT AND WITH CONTRAST TECHNIQUE: Multiplanar and multiecho pulse sequences of the lumbar spine were obtained without and with intravenous contrast. CONTRAST:  79m MULTIHANCE GADOBENATE DIMEGLUMINE 529 MG/ML IV SOLN COMPARISON:  Multiple exams, including Pete/ 15/17 and 04/13/2015 FINDINGS: Segmentation: The lowest lumbar type non-rib-bearing vertebra is labeled as L5. Alignment: Dextroconvex lumbar scoliosis with significant rotary component. Vertebrae: Extensive marrow heterogeneity and patchy enhancement throughout the visualized lumbar spine and sacrum compatible with widespread osseous metastatic disease. This is probably superimposed on underlying chronic marrow heterogeneity. Most notable tumor involvement is in the spinous process of L2 where there is extraosseous extension of tumor suspected along the paraspinal tissues along with cortical demineralization of the L2 spinous process. Involvement results in a very small amount of epidural tumor along the posterior epidural space for example  image 14/10. Disc desiccation and loss of disc height at all levels in the lumbar spine. 3 mm grade 1 anterolisthesis at L3-4. Conus medullaris: Extends to the L1 level and appears normal. Paraspinal and other soft tissues: Low-grade presacral edema, image 14/6. Low-level edema in the paraspinal musculature. Left periaortic node 1.2 cm in short axis  on image 15/10. Disc levels: T12-L1:  No impingement.  Diffuse disc bulge. L1-2: Mild displacement of the left L1 nerve in the lateral extraforaminal space and borderline left subarticular lateral recess stenosis due to left eccentric disc bulge and left lateral extraforaminal disc protrusion. Mild left facet arthropathy. L2-3: Mild left foraminal stenosis and mild displacement of the right L2 nerve in the lateral extraforaminal space due to disc bulge and facet arthropathy. L3-4: Moderate left foraminal stenosis and mild left subarticular lateral recess stenosis due to disc uncovering, intervertebral spurring, facet arthropathy, and disc bulge. L4-5: Moderate to prominent right and mild left foraminal stenosis due to disc bulge, facet arthropathy, and intervertebral spurring. L5-S1: Moderate bilateral foraminal stenosis and mild central narrowing of the thecal sac due to facet arthropathy, intervertebral spurring, and disc bulge. IMPRESSION: 1. Widespread osseous metastatic disease and resulting marrow heterogeneity and heterogeneous enhancement. The only area of apparent destructive metastatic disease is in the L2 spinous process were there some extraosseous extension of tumor and a very small amount of posterior epidural tumor. 2. Lumbar scoliosis, spondylosis, and degenerative disc disease, causing moderate to prominent impingement at L4-5 ; moderate impingement at L3-4 and L5-S1; and mild impingement at L1- 2 and L2- 3, as detailed above. Electronically Signed   By: Van Clines M.D.   On: 02/01/2016 19:22   Dg Chest Portable 1 View  Result Date: 02/01/2016 CLINICAL DATA:  Pt has a hx of metastatic prostate cancer who presents today with weakness and numbness in his legs. Hx of COPD, hypertension, pneumonia. EXAM: PORTABLE CHEST 1 VIEW COMPARISON:  01/16/2016 FINDINGS: Cardiac silhouette is normal in size. No mediastinal or hilar masses. There are irregularly thickened interstitial markings  bilaterally. Airspace opacity is noted in the left mid lung and both lung bases. The findings may be stable from the prior study when allowing for differences in technique and lower lung volumes on the current exam. No convincing pleural effusion.  No pneumothorax. Multiple sclerotic bone lesions consistent with widespread metastatic prostate carcinoma. IMPRESSION: 1. Bilateral interstitial thickening with some hazy left mid and bilateral lower lung zone airspace opacities. Findings may be due to chronic interstitial thickening with areas of atelectasis. Pneumonia should be considered if there are consistent clinical symptoms. Findings accentuated by low lung volumes. No convincing pulmonary edema. Electronically Signed   By: Lajean Manes M.D.   On: 02/01/2016 20:09    Review of Systems - Negative except As above    Blood pressure 92/67, pulse 79, temperature 97.7 F (36.5 C), temperature source Oral, resp. rate 14, height '5\' 11"'$  (1.803 m), weight 75.5 kg (166 lb 7.2 oz), SpO2 96 %. Physical Exam  Assessment/Plan: In light of complete motor function loss over several weeks, likelihood of improvement in neurologic function with surgical intervention is negligible.  Given ventral and dorsal compressive pathology, patient would need decompression with stabilization.  He would have increased wound healing issues and pain with minimal chance for improvement and this does not make sense.  I have reviewed scans with Dr. Tammi Klippel, who agrees.    Peggyann Shoals, MD 02/02/2016, 9:40 AM

## 2016-02-03 LAB — BASIC METABOLIC PANEL
ANION GAP: 8 (ref 5–15)
BUN: 31 mg/dL — AB (ref 6–20)
CHLORIDE: 106 mmol/L (ref 101–111)
CO2: 19 mmol/L — ABNORMAL LOW (ref 22–32)
Calcium: 8.9 mg/dL (ref 8.9–10.3)
Creatinine, Ser: 0.94 mg/dL (ref 0.61–1.24)
GFR calc Af Amer: >60 mL/min (ref >60)
GFR calc non Af Amer: >60 mL/min (ref >60)
Glucose, Bld: 168 mg/dL — ABNORMAL HIGH (ref 65–99)
POTASSIUM: 4 mmol/L (ref 3.5–5.1)
SODIUM: 133 mmol/L — AB (ref 135–145)

## 2016-02-03 LAB — CBC
HCT: 34.2 % — ABNORMAL LOW (ref 39.0–52.0)
HEMATOCRIT: 32 % — AB (ref 39.0–52.0)
HEMOGLOBIN: 10.8 g/dL — AB (ref 13.0–17.0)
HEMOGLOBIN: 10.4 g/dL — AB (ref 13.0–17.0)
MCH: 26.5 pg (ref 26.0–34.0)
MCH: 26.8 pg (ref 26.0–34.0)
MCHC: 31.6 g/dL (ref 30.0–36.0)
MCHC: 32.5 g/dL (ref 30.0–36.0)
MCV: 83.8 fL (ref 78.0–100.0)
MCV: 82.5 fL (ref 78.0–100.0)
Platelets: 255 10*3/uL (ref 150–400)
Platelets: 263 10*3/uL (ref 150–400)
RBC: 4.08 MIL/uL — AB (ref 4.22–5.81)
RBC: 3.88 MIL/uL — AB (ref 4.22–5.81)
RDW: 15.8 % — ABNORMAL HIGH (ref 11.5–15.5)
RDW: 15.6 % — ABNORMAL HIGH (ref 11.5–15.5)
WBC: 8.1 10*3/uL (ref 4.0–10.5)
WBC: 9.4 10*3/uL (ref 4.0–10.5)

## 2016-02-03 NOTE — Progress Notes (Signed)
[facility] Progress Note  Patient: Billy Lopez. [id number]   PCP: [name], MD DOB: 11/05/1940   DOA: 02/01/2016   DOS: 02/03/2016   Date of Service: the patient was seen and examined on 02/03/2016  Subjective: Pain appears to be well controlled. Denies any acute complaint. Nutrition: Tolerating oral diet  Brief hospital course: Pt. with PMH of stage IV prostate cancer, HIV, chronic hep C, substance abuse, essential hypertension; admitted on 02/01/2016, with complaint of progressive weakness, was found to have cord compression. Currently further plan is continue steroids and radiation therapy.  Assessment and Plan: 1. Prostate cancer metastatic to bone [facility]) Cord compression off T1 and T2. Patient presents with progressively worsening weakness and numbness of bilateral lower extremity with bricks reflexes. Discussed with neurosurgery who recommends patient will be a poor candidate for surgical intervention due to prolonged duration of symptoms which entails limited postoperative recovery. Discussed with radiation oncology who is performing radiation for the patient. Also consulted medical oncology. We'll continue to closely monitor the patient.  2. 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 Patient is on fentanyl patch. Currently we will use when necessary Dilaudid for breakthrough. Also increasing Norco and would prefer oral pain medication or IV.  3. Constipation. Next and BRBPR CBC relatively stable. We'll continue laxatives.  4. HIV. Continue anti-retroviral.  Pain management: Home fentanyl patch, when necessary Dilaudid, when necessary Norco Activity: Currently on bedrest.  Bowel regimen: last BM 02/03/2016 Diet: Cardiac diet DVT Prophylaxis: mechanical compression device.  Advance goals of care discussion: Full code. Patient mentions his daughter will be his power of attorney.  Family Communication: no family was present at bedside, at the time of  interview.   Disposition:  Discharge to likely home versus SNF. Expected discharge date: 02/06/16,  Consultants: Phone consultation with neurology, neurosurgery, medical oncology, radiation oncology Procedures: Radiation  Antibiotics: Anti-infectives    Start     Dose/Rate Route Frequency Ordered Stop   02/02/16 0000  dolutegravir (TIVICAY) tablet 50 mg     50 mg Oral Daily 02/01/16 2210     02/01/16 2215  emtricitabine-tenofovir AF (DESCOVY) 200-25 MG per tablet 1 tablet     1 tablet Oral Daily at bedtime 02/01/16 2210          Intake/Output Summary (Last 24 hours) at 02/03/16 1521 Last data filed at 02/03/16 1300  Gross per 24 hour  Intake              720 ml  Output             1475 ml  Net             -755 ml   Filed Weights   02/02/16 0700  Weight: 75.5 kg (166 lb 7.2 oz)    Objective: Physical Exam: Vitals:   02/02/16 1726 02/02/16 2025 02/03/16 0620 02/03/16 1348  BP: 91/62 115/74 119/84 110/78  Pulse: 87 86 75 75  Resp: 16 16 16 16   Temp: 98.3 F (36.8 C) 99.4 F (37.4 C) 98.2 F (36.8 C) 98.4 F (36.9 C)  TempSrc: Oral Oral Oral Oral  SpO2: 95% 92% 98% 99%  Weight:      Height:        General: Alert, Awake and Oriented to Time, Place and Person. Appear in mild distress Eyes: PERRL, Conjunctiva normal ENT: Oral Mucosa clear moist. Neck: no JVD, no Abnormal Mass Or lumps Cardiovascular: S1 and S2 Present, no Murmur, Respiratory: Bilateral Air entry equal and  Decreased, Clear to Auscultation, no Crackles, no wheezes Abdomen: Bowel Sound present, Soft and no tenderness Skin: no redness, no Rash  Extremities: no Pedal edema, no calf tenderness Neurologic: Mental status AAOx3, speech normal, attention normal,  Cranial Nerves PERRL, EOM normal and present, Motor strength bilateral upper extremity equal strength 4/5, LE 1/5 Sensation absent to light touch in LE Reflexes present knee and biceps, babinski equivocal,  Cerebellar test normal finger nose  finger.  Data Reviewed: CBC:  Recent Labs Lab 02/01/16 1607 02/01/16 2248 02/02/16 0356 02/02/16 0956 02/03/16 0740  WBC 5.7 6.5 6.3 5.8 8.1  NEUTROABS 3.8  --  4.0  --   --   HGB 10.5* 10.7* 9.9* 10.3* 10.8*  HCT 32.7* 33.2* 30.6* 32.1* 34.2*  MCV 84.1 81.8 83.8 84.3 83.8  PLT 218 212 200 213 [id number]   Basic Metabolic Panel:  Recent Labs Lab 02/01/16 1607 02/02/16 0356 02/03/16 0740  NA 134* 135 133*  K 3.8 4.4 4.0  CL 108 111 106  CO2 21* 20* 19*  GLUCOSE 107* 100* 168*  BUN 35* 28* 31*  CREATININE 1.03 0.93 0.94  CALCIUM 8.6* 8.6* 8.9    Liver Function Tests:  Recent Labs Lab 02/01/16 1607 02/02/16 0356  AST 60* 46*  ALT 16* 16*  ALKPHOS 1,719* 1,745*  BILITOT 0.3 0.3  PROT 6.2* 5.8*  ALBUMIN 3.2* 2.9*   No results for input(s): LIPASE, AMYLASE in the last 168 hours. No results for input(s): AMMONIA in the last 168 hours. Coagulation Profile: No results for input(s): INR, PROTIME in the last 168 hours. Cardiac Enzymes:  Recent Labs Lab 02/01/16 1607  TROPONINI <0.03   BNP (last 3 results) No results for input(s): PROBNP in the last 8760 hours.  CBG: No results for input(s): GLUCAP in the last 168 hours.  Studies: No results found.   Scheduled Meds: . cholecalciferol  1,000 Units Oral Daily  . dexamethasone  8 mg Intravenous Q6H  . dolutegravir  50 mg Oral Daily  . emtricitabine-tenofovir AF  1 tablet Oral QHS  . enalapril  20 mg Oral Daily  . feeding supplement (ENSURE ENLIVE)  237 mL Oral BID BM  . fentaNYL  50 mcg Transdermal Q72H  . furosemide  20 mg Oral Daily  . polyethylene glycol  17 g Oral Daily  . potassium chloride  10 mEq Oral Daily  . senna-docusate  2 tablet Oral BID  . vitamin A  10,000 Units Oral QPM  . vitamin C  500 mg Oral QPM   Continuous Infusions:   PRN Meds: acetaminophen **OR** acetaminophen, bisacodyl, HYDROcodone-acetaminophen, HYDROmorphone (DILAUDID) injection, magnesium hydroxide, ondansetron **OR**  ondansetron (ZOFRAN) IV  Time spent: 30 minutes  Author: Berle Mull, MD [facility] Pager: [phone] 02/03/2016 3:21 PM  If 7PM-7AM, please contact night-coverage at [email or web address], password [facility]

## 2016-02-04 LAB — BASIC METABOLIC PANEL
ANION GAP: 8 (ref 5–15)
BUN: 31 mg/dL — ABNORMAL HIGH (ref 6–20)
CHLORIDE: 108 mmol/L (ref 101–111)
CO2: 16 mmol/L — AB (ref 22–32)
Calcium: 8.5 mg/dL — ABNORMAL LOW (ref 8.9–10.3)
Creatinine, Ser: 0.95 mg/dL (ref 0.61–1.24)
GFR calc non Af Amer: >60 mL/min (ref >60)
GLUCOSE: 216 mg/dL — AB (ref 65–99)
POTASSIUM: 3.9 mmol/L (ref 3.5–5.1)
Sodium: 132 mmol/L — ABNORMAL LOW (ref 135–145)

## 2016-02-04 LAB — CBC
HEMATOCRIT: 30.4 % — AB (ref 39.0–52.0)
HEMOGLOBIN: 10 g/dL — AB (ref 13.0–17.0)
MCH: 27 pg (ref 26.0–34.0)
MCHC: 32.9 g/dL (ref 30.0–36.0)
MCV: 81.9 fL (ref 78.0–100.0)
Platelets: 255 10*3/uL (ref 150–400)
RBC: 3.71 MIL/uL — AB (ref 4.22–5.81)
RDW: 15.8 % — AB (ref 11.5–15.5)
WBC: 9.7 10*3/uL (ref 4.0–10.5)

## 2016-02-04 MED ORDER — HYDROCORTISONE 2.5 % RE CREA
TOPICAL_CREAM | Freq: Two times a day (BID) | RECTAL | Status: DC
  Administered 2016-02-04: 10:00:00 via RECTAL
  Administered 2016-02-04: 1 via RECTAL
  Administered 2016-02-05 – 2016-02-06 (×3): via RECTAL
  Filled 2016-02-04 (×2): qty 28.35

## 2016-02-04 NOTE — Progress Notes (Signed)
[facility] Progress Note  Patient: Billy Lopez. [id number]   PCP: [name], MD DOB: 07-18-1940   DOA: 02/01/2016   DOS: 02/04/2016   Date of Service: the patient was seen and examined on 02/04/2016  Subjective: Patient mentions his pain is well controlled. Denies any nausea or vomiting. Nutrition: Tolerating oral diet  Brief hospital course: Pt. with PMH of stage IV prostate cancer, HIV, chronic hep C, substance abuse, essential hypertension; admitted on 02/01/2016, with complaint of progressive weakness, was found to have cord compression. Currently further plan is continue steroids and radiation therapy.  Assessment and Plan: 1. Prostate cancer metastatic to bone [facility]) Cord compression of T1 and T2. Patient presents with progressively worsening weakness and numbness of bilateral lower extremity with bricks reflexes. Discussed with neurosurgery who recommends patient will be a poor candidate for surgical intervention due to prolonged duration of symptoms which entails limited postoperative recovery. Discussed with radiation oncology who is performing radiation for the patient. Also consulted medical oncology. We'll continue to closely monitor the patient.  2. 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 Patient is on fentanyl patch. Currently we will use when necessary Dilaudid for breakthrough. Also increasing Norco and would prefer oral pain medication over IV.  3. Constipation. BRBPR CBC relatively stable. We'll continue laxatives. BRBPR likely from hemorrhoids, use rectal Anusol cream.  4. HIV. Continue anti-retroviral.  Pain management: Home fentanyl patch, when necessary Dilaudid, when necessary Norco Activity: Currently on bedrest.  Bowel regimen: last BM 02/04/2016 Diet: Cardiac diet DVT Prophylaxis: mechanical compression device.  Advance goals of care discussion: Full code. Patient mentions his daughter will be his power of attorney.  Family  Communication: no family was present at bedside, at the time of interview.   Disposition:  Discharge to likely home versus SNF. Expected discharge date: 02/06/16,  Consultants: Phone consultation with neurology, neurosurgery, medical oncology, radiation oncology Procedures: Radiation  Antibiotics: Anti-infectives    Start     Dose/Rate Route Frequency Ordered Stop   02/02/16 0000  dolutegravir (TIVICAY) tablet 50 mg     50 mg Oral Daily 02/01/16 2210     02/01/16 2215  emtricitabine-tenofovir AF (DESCOVY) 200-25 MG per tablet 1 tablet     1 tablet Oral Daily at bedtime 02/01/16 2210          Intake/Output Summary (Last 24 hours) at 02/04/16 1511 Last data filed at 02/04/16 1300  Gross per 24 hour  Intake              720 ml  Output              800 ml  Net              -80 ml   Filed Weights   02/02/16 0700  Weight: 75.5 kg (166 lb 7.2 oz)    Objective: Physical Exam: Vitals:   02/03/16 1348 02/03/16 2117 02/04/16 0449 02/04/16 1432  BP: 110/78 105/76 123/85 (!) 133/95  Pulse: 75 68 69 61  Resp: 16 16 16 16   Temp: 98.4 F (36.9 C) 99.2 F (37.3 C) 98.1 F (36.7 C) 98.9 F (37.2 C)  TempSrc: Oral Oral Oral Oral  SpO2: 99% 97% 97% 98%  Weight:      Height:        General: Alert, Awake and Oriented to Time, Place and Person. Appear in mild distress Eyes: PERRL, Conjunctiva normal ENT: Oral Mucosa clear moist. Neck: no JVD, no Abnormal Mass Or lumps Cardiovascular: S1  and S2 Present, no Murmur, Respiratory: Bilateral Air entry equal and Decreased, Clear to Auscultation, no Crackles, no wheezes Abdomen: Bowel Sound present, Soft and no tenderness Skin: no redness, no Rash  Extremities: no Pedal edema, no calf tenderness Neurologic: Mental status AAOx3, speech normal, attention normal, Continues to have weakness and bilateral lower extremity as well as numbness.  Data Reviewed: CBC:  Recent Labs Lab 02/01/16 1607  02/02/16 0356 02/02/16 0956  02/03/16 0740 02/03/16 2049 02/04/16 0418  WBC 5.7  < > 6.3 5.8 8.1 9.4 9.7  NEUTROABS 3.8  --  4.0  --   --   --   --   HGB 10.5*  < > 9.9* 10.3* 10.8* 10.4* 10.0*  HCT 32.7*  < > 30.6* 32.1* 34.2* 32.0* 30.4*  MCV 84.1  < > 83.8 84.3 83.8 82.5 81.9  PLT 218  < > 200 213 255 263 255  < > = values in this interval not displayed. Basic Metabolic Panel:  Recent Labs Lab 02/01/16 1607 02/02/16 0356 02/03/16 0740 02/04/16 0900  NA 134* 135 133* 132*  K 3.8 4.4 4.0 3.9  CL 108 111 106 108  CO2 21* 20* 19* 16*  GLUCOSE 107* 100* 168* 216*  BUN 35* 28* 31* 31*  CREATININE 1.03 0.93 0.94 0.95  CALCIUM 8.6* 8.6* 8.9 8.5*    Liver Function Tests:  Recent Labs Lab 02/01/16 1607 02/02/16 0356  AST 60* 46*  ALT 16* 16*  ALKPHOS 1,719* 1,745*  BILITOT 0.3 0.3  PROT 6.2* 5.8*  ALBUMIN 3.2* 2.9*   No results for input(s): LIPASE, AMYLASE in the last 168 hours. No results for input(s): AMMONIA in the last 168 hours. Coagulation Profile: No results for input(s): INR, PROTIME in the last 168 hours. Cardiac Enzymes:  Recent Labs Lab 02/01/16 1607  TROPONINI <0.03   BNP (last 3 results) No results for input(s): PROBNP in the last 8760 hours.  CBG: No results for input(s): GLUCAP in the last 168 hours.  Studies: No results found.   Scheduled Meds: . cholecalciferol  1,000 Units Oral Daily  . dexamethasone  8 mg Intravenous Q6H  . dolutegravir  50 mg Oral Daily  . emtricitabine-tenofovir AF  1 tablet Oral QHS  . enalapril  20 mg Oral Daily  . feeding supplement (ENSURE ENLIVE)  237 mL Oral BID BM  . fentaNYL  50 mcg Transdermal Q72H  . furosemide  20 mg Oral Daily  . hydrocortisone   Rectal BID  . polyethylene glycol  17 g Oral Daily  . potassium chloride  10 mEq Oral Daily  . senna-docusate  2 tablet Oral BID   Continuous Infusions:   PRN Meds: acetaminophen **OR** acetaminophen, bisacodyl, HYDROcodone-acetaminophen, HYDROmorphone (DILAUDID) injection,  magnesium hydroxide, ondansetron **OR** ondansetron (ZOFRAN) IV  Time spent: 30 minutes  Author: Berle Mull, MD [facility] Pager: [phone] 02/04/2016 3:11 PM  If 7PM-7AM, please contact night-coverage at [email or web address], password [facility]

## 2016-02-05 DIAGNOSIS — C7951 Secondary malignant neoplasm of bone: Secondary | ICD-10-CM | POA: Insufficient documentation

## 2016-02-05 LAB — CBC
HEMATOCRIT: 32.9 % — AB (ref 39.0–52.0)
Hemoglobin: 11.4 g/dL — ABNORMAL LOW (ref 13.0–17.0)
MCH: 27.5 pg (ref 26.0–34.0)
MCHC: 34.7 g/dL (ref 30.0–36.0)
MCV: 79.5 fL (ref 78.0–100.0)
PLATELETS: 269 10*3/uL (ref 150–400)
RBC: 4.14 MIL/uL — ABNORMAL LOW (ref 4.22–5.81)
RDW: 15.6 % — AB (ref 11.5–15.5)
WBC: 11.1 10*3/uL — ABNORMAL HIGH (ref 4.0–10.5)

## 2016-02-05 LAB — BASIC METABOLIC PANEL
ANION GAP: 7 (ref 5–15)
BUN: 36 mg/dL — ABNORMAL HIGH (ref 6–20)
CO2: 18 mmol/L — ABNORMAL LOW (ref 22–32)
Calcium: 8.6 mg/dL — ABNORMAL LOW (ref 8.9–10.3)
Chloride: 109 mmol/L (ref 101–111)
Creatinine, Ser: 0.77 mg/dL (ref 0.61–1.24)
Glucose, Bld: 127 mg/dL — ABNORMAL HIGH (ref 65–99)
POTASSIUM: 4.1 mmol/L (ref 3.5–5.1)
SODIUM: 134 mmol/L — AB (ref 135–145)

## 2016-02-05 NOTE — Progress Notes (Signed)
PT Cancellation Note  Patient Details Name: Billy Lopez. MRN: [id number] DOB: 12-21-1940   Cancelled Treatment:    Reason Eval/Treat Not Completed: Other (comment) (awaiting TLSO brace)   Kadesia Robel,KATHrine E 02/05/2016, 3:17 PM Carmelia Bake, PT, DPT 02/05/2016 Pager: [phone]

## 2016-02-05 NOTE — Progress Notes (Signed)
[facility] Progress Note  Patient: Billy Lopez. [id number]   PCP: [name], MD DOB: [birth year]   DOA: 02/01/2016   DOS: 02/05/2016   Date of Service: the patient was seen and examined on 02/05/2016  Subjective: no acute complains. Nutrition: Tolerating oral diet  Brief hospital course: Pt. with PMH of stage IV prostate cancer, HIV, chronic hep C, substance abuse, essential hypertension; admitted on 02/01/2016, with complaint of progressive weakness, was found to have cord compression. Currently further plan is continue steroids and radiation therapy.  Assessment and Plan: 1. Prostate cancer metastatic to bone [facility]) Cord compression of T1 and T2. Patient presents with progressively worsening weakness and numbness of bilateral lower extremity with bricks reflexes. Discussed with neurosurgery who recommends patient will be a poor candidate for surgical intervention due to chronic duration of symptoms which entails limited postoperative recovery. Discussed with radiation oncology who is performing radiation for the patient on 02/05/2016. Also consulted medical oncology. We'll continue to closely monitor the patient. TLSO brace and physical therapy  2. 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 Patient is on fentanyl patch. Currently we will use when necessary Dilaudid for breakthrough. Also increasing Norco and would prefer oral pain medication over IV.  3. Constipation. BRBPR CBC relatively stable. We'll continue laxatives. BRBPR likely from hemorrhoids, use rectal Anusol cream.  4. HIV. Continue anti-retroviral.  Pain management: Home fentanyl patch, when necessary Dilaudid, when necessary Norco Activity: TLSO brace and physical therapy Bowel regimen: last BM 02/04/2016 Diet: Cardiac diet DVT Prophylaxis: mechanical compression device.  Advance goals of care discussion: Full code. Patient mentions his daughter will be his power of attorney.  Family Communication:  no family was present at bedside, at the time of interview.   Disposition:  Discharge to likely home versus SNF. Expected discharge date: 02/06/16,  Consultants: Phone consultation with neurology, neurosurgery, medical oncology, radiation oncology Procedures: Radiation  Antibiotics: Anti-infectives    Start     Dose/Rate Route Frequency Ordered Stop   02/02/16 0000  dolutegravir (TIVICAY) tablet 50 mg     50 mg Oral Daily 02/01/16 2210     02/01/16 2215  emtricitabine-tenofovir AF (DESCOVY) 200-25 MG per tablet 1 tablet     1 tablet Oral Daily at bedtime 02/01/16 2210          Intake/Output Summary (Last 24 hours) at 02/05/16 1549 Last data filed at 02/05/16 1300  Gross per 24 hour  Intake              480 ml  Output             2375 ml  Net            -1895 ml   Filed Weights   02/02/16 0700  Weight: 75.5 kg (166 lb 7.2 oz)    Objective: Physical Exam: Vitals:   02/04/16 1432 02/04/16 2110 02/05/16 0445 02/05/16 1433  BP: (!) 133/95 128/88 (!) 147/97 107/65  Pulse: 61 63 68 72  Resp: 16 16 16 16   Temp: 98.9 F (37.2 C) 98.1 F (36.7 C) 98.3 F (36.8 C) 98.7 F (37.1 C)  TempSrc: Oral Oral Oral Oral  SpO2: 98% 99% 98% 99%  Weight:      Height:        General: Alert, Awake and Oriented to Time, Place and Person. Appear in mild distress Eyes: PERRL, Conjunctiva normal ENT: Oral Mucosa clear moist. Neck: no JVD, no Abnormal Mass Or lumps Cardiovascular: S1 and S2 Present,  no Murmur, Respiratory: Bilateral Air entry equal and Decreased, Clear to Auscultation, no Crackles, no wheezes Abdomen: Bowel Sound present, Soft and no tenderness Skin: no redness, no Rash  Extremities: no Pedal edema, no calf tenderness Neurologic: Mental status AAOx3, speech normal, attention normal, Continues to have weakness and bilateral lower extremity as well as numbness.  Data Reviewed: CBC:  Recent Labs Lab 02/01/16 1607  02/02/16 0356 02/02/16 0956 02/03/16 0740  02/03/16 2049 02/04/16 0418 02/05/16 0506  WBC 5.7  < > 6.3 5.8 8.1 9.4 9.7 11.1*  NEUTROABS 3.8  --  4.0  --   --   --   --   --   HGB 10.5*  < > 9.9* 10.3* 10.8* 10.4* 10.0* 11.4*  HCT 32.7*  < > 30.6* 32.1* 34.2* 32.0* 30.4* 32.9*  MCV 84.1  < > 83.8 84.3 83.8 82.5 81.9 79.5  PLT 218  < > 200 213 255 263 255 269  < > = values in this interval not displayed. Basic Metabolic Panel:  Recent Labs Lab 02/01/16 1607 02/02/16 0356 02/03/16 0740 02/04/16 0900 02/05/16 0506  NA 134* 135 133* 132* 134*  K 3.8 4.4 4.0 3.9 4.1  CL 108 111 106 108 109  CO2 21* 20* 19* 16* 18*  GLUCOSE 107* 100* 168* 216* 127*  BUN 35* 28* 31* 31* 36*  CREATININE 1.03 0.93 0.94 0.95 0.77  CALCIUM 8.6* 8.6* 8.9 8.5* 8.6*    Liver Function Tests:  Recent Labs Lab 02/01/16 1607 02/02/16 0356  AST 60* 46*  ALT 16* 16*  ALKPHOS 1,719* 1,745*  BILITOT 0.3 0.3  PROT 6.2* 5.8*  ALBUMIN 3.2* 2.9*   No results for input(s): LIPASE, AMYLASE in the last 168 hours. No results for input(s): AMMONIA in the last 168 hours. Coagulation Profile: No results for input(s): INR, PROTIME in the last 168 hours. Cardiac Enzymes:  Recent Labs Lab 02/01/16 1607  TROPONINI <0.03   BNP (last 3 results) No results for input(s): PROBNP in the last 8760 hours.  CBG: No results for input(s): GLUCAP in the last 168 hours.  Studies: No results found.   Scheduled Meds: . cholecalciferol  1,000 Units Oral Daily  . dexamethasone  8 mg Intravenous Q6H  . dolutegravir  50 mg Oral Daily  . emtricitabine-tenofovir AF  1 tablet Oral QHS  . enalapril  20 mg Oral Daily  . feeding supplement (ENSURE ENLIVE)  237 mL Oral BID BM  . fentaNYL  50 mcg Transdermal Q72H  . furosemide  20 mg Oral Daily  . hydrocortisone   Rectal BID  . polyethylene glycol  17 g Oral Daily  . potassium chloride  10 mEq Oral Daily  . senna-docusate  2 tablet Oral BID   Continuous Infusions:   PRN Meds: acetaminophen **OR** acetaminophen,  bisacodyl, HYDROcodone-acetaminophen, HYDROmorphone (DILAUDID) injection, magnesium hydroxide, ondansetron **OR** ondansetron (ZOFRAN) IV  Time spent: 30 minutes  Author: Berle Mull, MD [facility] Pager: [phone] 02/05/2016 3:49 PM  If 7PM-7AM, please contact night-coverage at [email or web address], password [facility]

## 2016-02-06 ENCOUNTER — Ambulatory Visit
Admit: 2016-02-06 | Discharge: 2016-02-06 | Disposition: A | Discharge status: Home / Self Care | Payer: Commercial Managed Care - HMO | Attending: Radiation Oncology | Admitting: Radiation Oncology

## 2016-02-06 DIAGNOSIS — C61 Malignant neoplasm of prostate: Secondary | ICD-10-CM | POA: Diagnosis not present

## 2016-02-06 DIAGNOSIS — G619 Inflammatory polyneuropathy, unspecified: Secondary | ICD-10-CM | POA: Diagnosis not present

## 2016-02-06 DIAGNOSIS — E440 Moderate protein-calorie malnutrition: Secondary | ICD-10-CM | POA: Diagnosis not present

## 2016-02-06 DIAGNOSIS — G893 Neoplasm related pain (acute) (chronic): Secondary | ICD-10-CM | POA: Diagnosis not present

## 2016-02-06 DIAGNOSIS — M549 Dorsalgia, unspecified: Secondary | ICD-10-CM | POA: Diagnosis not present

## 2016-02-06 DIAGNOSIS — E46 Unspecified protein-calorie malnutrition: Secondary | ICD-10-CM | POA: Diagnosis not present

## 2016-02-06 DIAGNOSIS — R1312 Dysphagia, oropharyngeal phase: Secondary | ICD-10-CM | POA: Diagnosis not present

## 2016-02-06 DIAGNOSIS — F19129 Other psychoactive substance abuse with intoxication, unspecified: Secondary | ICD-10-CM | POA: Diagnosis not present

## 2016-02-06 DIAGNOSIS — M6281 Muscle weakness (generalized): Secondary | ICD-10-CM | POA: Diagnosis not present

## 2016-02-06 DIAGNOSIS — C7951 Secondary malignant neoplasm of bone: Secondary | ICD-10-CM | POA: Diagnosis not present

## 2016-02-06 DIAGNOSIS — R41841 Cognitive communication deficit: Secondary | ICD-10-CM | POA: Diagnosis not present

## 2016-02-06 DIAGNOSIS — Z923 Personal history of irradiation: Secondary | ICD-10-CM | POA: Diagnosis not present

## 2016-02-06 DIAGNOSIS — G8929 Other chronic pain: Secondary | ICD-10-CM | POA: Diagnosis not present

## 2016-02-06 DIAGNOSIS — R609 Edema, unspecified: Secondary | ICD-10-CM | POA: Diagnosis not present

## 2016-02-06 DIAGNOSIS — J449 Chronic obstructive pulmonary disease, unspecified: Secondary | ICD-10-CM | POA: Diagnosis not present

## 2016-02-06 DIAGNOSIS — R2689 Other abnormalities of gait and mobility: Secondary | ICD-10-CM | POA: Diagnosis not present

## 2016-02-06 DIAGNOSIS — R5381 Other malaise: Secondary | ICD-10-CM | POA: Diagnosis not present

## 2016-02-06 DIAGNOSIS — B20 Human immunodeficiency virus [HIV] disease: Secondary | ICD-10-CM | POA: Diagnosis not present

## 2016-02-06 DIAGNOSIS — K5900 Constipation, unspecified: Secondary | ICD-10-CM | POA: Diagnosis not present

## 2016-02-06 DIAGNOSIS — R29898 Other symptoms and signs involving the musculoskeletal system: Secondary | ICD-10-CM | POA: Diagnosis not present

## 2016-02-06 DIAGNOSIS — I10 Essential (primary) hypertension: Secondary | ICD-10-CM | POA: Diagnosis not present

## 2016-02-06 LAB — BASIC METABOLIC PANEL
Anion gap: 4 — ABNORMAL LOW (ref 5–15)
BUN: 31 mg/dL — AB (ref 6–20)
CHLORIDE: 109 mmol/L (ref 101–111)
CO2: 18 mmol/L — AB (ref 22–32)
Calcium: 8.3 mg/dL — ABNORMAL LOW (ref 8.9–10.3)
Creatinine, Ser: 0.83 mg/dL (ref 0.61–1.24)
GFR calc Af Amer: >60 mL/min (ref >60)
GFR calc non Af Amer: >60 mL/min (ref >60)
GLUCOSE: 123 mg/dL — AB (ref 65–99)
POTASSIUM: 4.5 mmol/L (ref 3.5–5.1)
Sodium: 131 mmol/L — ABNORMAL LOW (ref 135–145)

## 2016-02-06 LAB — CBC
HCT: 33.6 % — ABNORMAL LOW (ref 39.0–52.0)
Hemoglobin: 10.9 g/dL — ABNORMAL LOW (ref 13.0–17.0)
MCH: 26 pg (ref 26.0–34.0)
MCHC: 32.4 g/dL (ref 30.0–36.0)
MCV: 80.2 fL (ref 78.0–100.0)
PLATELETS: 292 10*3/uL (ref 150–400)
RBC: 4.19 MIL/uL — AB (ref 4.22–5.81)
RDW: 16 % — AB (ref 11.5–15.5)
WBC: 12 10*3/uL — ABNORMAL HIGH (ref 4.0–10.5)

## 2016-02-06 MED ORDER — POLYETHYLENE GLYCOL 3350 17 G PO PACK: 17.0000 g | PACK | Freq: Every day | ORAL | 0 refills | Status: AC

## 2016-02-06 MED ORDER — HYDROCODONE-ACETAMINOPHEN 5-325 MG PO TABS: 1.0000 | ORAL_TABLET | Freq: Four times a day (QID) | ORAL | 0 refills | Status: AC | PRN

## 2016-02-06 MED ORDER — HYDROCORTISONE 1 % RE CREA: 1.0000 | TOPICAL_CREAM | RECTAL | 0 refills | Status: AC | PRN

## 2016-02-06 NOTE — Progress Notes (Signed)
Called and gave report to Seth Bake, Therapist, sports at [facility].

## 2016-02-06 NOTE — Progress Notes (Signed)
CSW assisting with d/c planning. Pt plans to return to [facility] at d/c. PT Eval is pending. CSW has contacted [employer] to request authorization for rehab at [facility]. Decision is pending PT evaluation. Brace is needed prior to PT Eval. CSW will continue to follow to assist with d/c planning to SNF.  Werner Lean LCSW [phone]

## 2016-02-06 NOTE — Progress Notes (Signed)
Pt is in agreement with d/c to [facility] today. [employer] medicare has provided authorization. [facility] transport is required. Medical necessity form completed. D/C summary sent to SNF for review. CSW has alerted SNF that pt has an appointment at 7:30 am tomorrow for radiation treatment at [facility]. Radiation treatment schedule has been included in d/c packet. Scripts included in d/c packet. # for report provided to nsg. Director of [place] met with CSW and pt at bedside. Director will make arrangements to get pt's motorized w/c to [facility] care. Pt appreciates this assistance.   Werner Lean LCSW [phone]

## 2016-02-06 NOTE — Addendum Note (Signed)
Encounter addended by: Ernst Spell, RN on: 02/06/2016  8:18 AM<BR>    Actions taken: Charge Capture section accepted

## 2016-02-06 NOTE — Care Management Note (Signed)
Case Management Note  Patient Details  Name: Billy Lopez. MRN: [id number] Date of Birth: [birth year]  Subjective/Objective:     75 yo admitted with Prostate cancer with mets to bone.               Action/Plan: From [facility] and to return there at DC.  Expected Discharge Date:   (unknown)               Expected Discharge Plan:  Skilled Nursing Facility  In-House Referral:  Clinical Social Work  Discharge planning Services  CM Consult  Post Acute Care Choice:    Choice offered to:     DME Arranged:    DME Agency:     HH Arranged:    [place] Agency:     Status of Service:  Completed, signed off  If discussed at [employer], dates discussed:    Additional CommentsLynnell Catalan, RN 02/06/2016, 2:22 PM  [phone]

## 2016-02-06 NOTE — Evaluation (Signed)
Physical Therapy Evaluation Patient Details Name: Billy Lopez. MRN: [id number] DOB: 1940-10-10 Today's Date: 02/06/2016   History of Present Illness  HPI: Billy Lopez. is a 75 y.o. male with metastatic prostate cancer, HIV, COPD, hypertension, hepatitis C admitted 2 weeks ago for pneumonia presents to the ER 02/01/16 because of persistent pain and weakness of the lower extremity with numbness over the last 1 week. Patient states he is usually ambulatory with help of a walker and transfers himself to the wheelchair. But over the last 1 week he is unable to do this.  Widespread metastatic disease throughout the cervical and thoracic.   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   Extensive extra-spinal tumor, involving the RIGHT ribs, greatest at  Clinical Impression  The  Patient now has a TLSO which was donned in supine/sidelying. Assisted to sitting on the edge of bed. The patient demonstrates  No active movement of the legs, he was able to support himself with upper body for 2 minutes with min assit.  Assisted back to bed. Increased  Back pain with mobility. Pt admitted with above diagnosis. Pt currently with functional limitations due to the deficits listed below (see PT Problem List).  Pt will benefit from skilled PT to increase their independence and safety with mobility to allow discharge to the venue listed below/ rehab.      Follow Up Recommendations SNF;Supervision/Assistance - 24 hour    Equipment Recommendations  None recommended by PT    Recommendations for Other Services       Precautions / Restrictions Precautions Precautions: Fall Precaution Comments: incontinent, paraplegia Required Braces or Orthoses: Spinal Brace Spinal Brace: Thoracolumbosacral orthotic;Applied in supine position      Mobility  Bed Mobility Overal bed mobility: Needs Assistance;+2 for physical assistance;+ 2 for safety/equipment Bed Mobility: Rolling;Sidelying to  Sit;Sit to Sidelying Rolling: Max assist Sidelying to sit: Total assist;+2 for physical assistance;+2 for safety/equipment;HOB elevated     Sit to sidelying: Total assist;+2 for physical assistance;+2 for safety/equipment General bed mobility comments: patient requires extensive assist for  rolling and sitting up. TLSO placed while in side/supine, HOB raised  to assist getting trunk to upright position, cues for Right arm position to self assist pushing up to sitting., Required  total assist to return to sidelying/supine.Assisted to roll to remove brace. Incontinent of BM.  Transfers                    Ambulation/Gait                Stairs            Wheelchair Mobility    Modified Rankin (Stroke Patients Only)       Balance Overall balance assessment: Needs assistance;History of Falls Sitting-balance support: Feet supported;Bilateral upper extremity supported Sitting balance-Leahy Scale: Poor Sitting balance - Comments: must have support of arms on the rail and foot of bed                                     Pertinent Vitals/Pain Pain Score: 10-Worst pain ever Pain Location: back Pain Descriptors / Indicators: Aching Pain Intervention(s): Patient requesting pain meds-RN notified;Monitored during session;Limited activity within patient's tolerance    Home Living Family/patient expects to be discharged to:: Skilled nursing facility  Prior Function           Comments: patient was dependent on power chair PTA . patient was able to stand at lasst admission     Hand Dominance        Extremity/Trunk Assessment   Upper Extremity Assessment: RUE deficits/detail;LUE deficits/detail RUE Deficits / Details: decreased grip, raises arms to 90 degrees only     LUE Deficits / Details: same as right     RLE Deficits / Details: no active movement from hips to feet, diminished LT LLE Deficits / Details: no  active movement, feels light touch but diminished     Communication      Cognition Arousal/Alertness: Awake/alert Behavior During Therapy: WFL for tasks assessed/performed Overall Cognitive Status: Within Functional Limits for tasks assessed                      General Comments      Exercises        Assessment/Plan    PT Assessment Patient needs continued PT services  PT Diagnosis Generalized weakness;Acute pain;Other (comment) (paraplegia)   PT Problem List Decreased strength;Decreased activity tolerance;Decreased balance;Decreased mobility;Decreased knowledge of precautions;Pain  PT Treatment Interventions DME instruction;Functional mobility training;Therapeutic activities;Therapeutic exercise;Balance training   PT Goals (Current goals can be found in the Care Plan section) Acute Rehab PT Goals Patient Stated Goal: to get up and OOB PT Goal Formulation: With patient Time For Goal Achievement: 02/20/16 Potential to Achieve Goals: Fair    Frequency     Barriers to discharge        Co-evaluation               End of Session   Activity Tolerance: Patient limited by pain Patient left: in bed;with call bell/phone within reach;with bed alarm set Nurse Communication: Mobility status         Time: 1245-1315 PT Time Calculation (min) (ACUTE ONLY): 30 min   Charges:   PT Evaluation $PT Eval High Complexity: 1 Procedure PT Treatments $Therapeutic Activity: 8-22 mins   PT G Codes:        Claretha Cooper 02/06/2016, 1:36 PM Tresa Endo PT [phone]

## 2016-02-06 NOTE — Discharge Summary (Signed)
[facility] Discharge Summary   Patient: Billy Lopez. NN:[id number]   PCP: [name], MD DOB: 06-13-1940   Date of admission: 02/01/2016   Date of discharge:  02/06/2016    Discharge Diagnoses:  Principal Problem:   Prostate cancer metastatic to bone [facility]) Active Problems:   Human immunodeficiency virus (HIV) disease ([place])   Chronic hepatitis C without hepatic coma ([place])   Essential hypertension   Constipation   Intractable pain   Back pain   Lower extremity weakness   Malnutrition of moderate degree   Cord compression [facility])  Admitted From: ALF Disposition:  SNF  Recommendations for Outpatient Follow-up:  1. Please follow-up for radiation oncology as scheduled. 2. Follow-up with PCP in one week. 3. Continue follow-up with medical oncology. 4. Follow-up with Dr. Vertell Limber as needed for neurosurgery.  Follow-up Information    [name], MD. Schedule an appointment as soon as possible for a visit in 1 week(s).   Specialties:  Internal Medicine, Radiology Contact information: [address] [place] [address] [phone]        Scharlene Gloss, MD .   Specialty:  Infectious Diseases Contact information: [place] [place] [address] [phone]        [facility], MD. Schedule an appointment as soon as possible for a visit in 1 week(s).   Specialty:  Oncology Contact information: [place]. [place] [address] [phone]          Diet recommendation: cardiac diet  Activity: The patient is advised to gradually reintroduce usual activities.  Discharge Condition: good  Code Status: Full code  History of present illness: As per the H and P dictated on admission, "Billy Lopez. is a 75 y.o. male with metastatic prostate cancer, HIV, COPD, hypertension, hepatitis C admitted 2 weeks ago for pneumonia presents to the ER because of persistent pain and weakness of the lower extremity with numbness over the last 1  week. Patient states he is usually ambulatory with help of a walker and transfers himself to the wheelchair. But over the last 1 week he is unable to do this. Denies any incontinence of urine or bowels and has been having some constipation. In the ER patient was noticed to have some blood in the stools. MRI of the lumbar spine does not show any cord compression but does show some nerve impingements. On exam patient is unable to move both his lower extremities with no sensation below his mid abdomen. Patient is being admitted for further workup"  Hospital Course:   Summary of his active problems in the hospital is as following. 1. Prostate cancer metastatic to bone (HCC) Cord compression of T1 and T2. Patient presents with progressively worsening weakness and numbness of bilateral lower extremity with bricks reflexes. Discussed with neurosurgery who recommends patient will be a poor candidate for surgical intervention due to prolonged chronic duration of symptoms which entails limited postoperative recovery, and the surgery would be extensive. Discussed with radiation oncology who performed radiation for the patient on 02/05/2016. Also consulted medical oncology. TLSO brace while ambulating for safety and continue physical therapy  2. 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 Patient is on fentanyl patch.  Also increasing Norco.  3. Constipation. BRBPR CBC relatively stable. We'll continue laxatives. BRBPR likely from hemorrhoids, use rectal Anusol cream.  4. HIV. Continue anti-retroviral.  All other chronic medical condition were stable during the hospitalization.  Patient was seen by physical therapy, who  recommended SNF, which was arranged by Education officer, museum and case Freight forwarder. On the day of the discharge the patient's vitals were stable and initiated his first dose of radiation, and no other acute medical condition were reported by patient. the patient was felt safe to be discharge at  SNF with therapy.  Procedures and Results:  Radiation   Consultations:  Interventional cardiology  Neurosurgery  Medical oncology  DISCHARGE MEDICATION: Current Discharge Medication List    START taking these medications   Details  hydrocortisone (PROCTOCORT) 1 % CREA Apply 1 Act topically as needed. Qty: 1 Tube, Refills: 0    polyethylene glycol (MIRALAX / GLYCOLAX) packet Take 17 g by mouth daily. Qty: 14 each, Refills: 0      CONTINUE these medications which have CHANGED   Details  HYDROcodone-acetaminophen (NORCO/VICODIN) 5-325 MG tablet Take 1-2 tablets by mouth every 6 (six) hours as needed for moderate pain. Qty: 30 tablet, Refills: 0      CONTINUE these medications which have NOT CHANGED   Details  acetaminophen (TYLENOL) 325 MG tablet Take 2 tablets (650 mg total) by mouth every 6 (six) hours as needed for mild pain (or Fever >/= 101). Qty: 30 tablet, Refills: 0    bisacodyl (DULCOLAX) 10 MG suppository Place 10 mg rectally daily as needed for mild constipation or moderate constipation.    bisacodyl (DULCOLAX) 5 MG EC tablet Take 1 tablet (5 mg total) by mouth daily as needed for moderate constipation. Qty: 30 tablet, Refills: 0    cholecalciferol (VITAMIN D) 1000 UNITS tablet Take 1,000 Units by mouth daily.     DESCOVY 200-25 MG tablet TAKE 1 TABLET BY MOUTH AT BEDTIME. Qty: 30 tablet, Refills: 6   Associated Diagnoses: Human immunodeficiency virus (HIV) disease (HCC)    enalapril (VASOTEC) 20 MG tablet Take 20 mg by mouth daily. Refills: 0    feeding supplement, ENSURE COMPLETE, (ENSURE COMPLETE) LIQD Take 237 mLs by mouth 2 (two) times daily between meals. Qty: 474 mL, Refills: 11    fentaNYL (DURAGESIC - DOSED MCG/HR) 50 MCG/HR Place 50 mcg onto the skin every 3 (three) days.    furosemide (LASIX) 20 MG tablet Take 20 mg by mouth daily. Refills: 0    HYDROmorphone (DILAUDID) 2 MG tablet Take 1 tablet (2 mg total) by mouth every 6 (six) hours as  needed for severe pain. Qty: 30 tablet, Refills: 0    magnesium hydroxide (MILK OF MAGNESIA) 400 MG/5ML suspension Take 30 mLs by mouth daily as needed for mild constipation.    potassium chloride (K-DUR) 10 MEQ tablet Take 1 tablet (10 mEq total) by mouth daily. Qty: 90 tablet, Refills: 2    sennosides-docusate sodium (SENOKOT-S) 8.6-50 MG tablet Take 1 tablet by mouth daily.    tamsulosin (FLOMAX) 0.4 MG CAPS capsule Take 1 capsule (0.4 mg total) by mouth 2 (two) times daily. Qty: 60 capsule, Refills: 5    TIVICAY 50 MG tablet TAKE 1 TABLET (50 MG TOTAL) BY MOUTH DAILY. Qty: 30 tablet, Refills: 6   Associated Diagnoses: Human immunodeficiency virus (HIV) disease (HCC)    vitamin A 10000 UNIT capsule Take 10,000 Units by mouth every evening.    vitamin C (ASCORBIC ACID) 500 MG tablet Take 500 mg by mouth every evening.      STOP taking these medications     tuberculin 5 UNIT/0.1ML injection        Allergies  Allergen Reactions  . Oxycodone Other (See Comments)    loopiness  Discharge Instructions    Diet - low sodium heart healthy    Complete by:  As directed   Discharge instructions    Complete by:  As directed   It is important that you read following instructions as well as go over your medication list with RN to help you understand your care after this hospitalization.  Discharge Instructions: Please follow-up with PCP in one week  Please request your primary care physician to go over all Hospital Tests and Procedure/Radiological results at the follow up,  Please get all Hospital records sent to your PCP by signing hospital release before you go home.   Do not drive, operating heavy machinery, perform activities at heights, swimming or participation in water activities or provide baby sitting services while your are on Pain, Sleep and Anxiety Medications; until you have been seen by Primary Care Physician or a Neurologist and advised to do so again. Do not take more  than prescribed Pain, Sleep and Anxiety Medications. You were cared for by a hospitalist during your hospital stay. If you have any questions about your discharge medications or the care you received while you were in the hospital after you are discharged, you can call the unit and ask to speak with the hospitalist on call if the hospitalist that took care of you is not available.  Once you are discharged, your primary care physician will handle any further medical issues. Please note that NO REFILLS for any discharge medications will be authorized once you are discharged, as it is imperative that you return to your primary care physician (or establish a relationship with a primary care physician if you do not have one) for your aftercare needs so that they can reassess your need for medications and monitor your lab values. You Must read complete instructions/literature along with all the possible adverse reactions/side effects for all the Medicines you take and that have been prescribed to you. Take any new Medicines after you have completely understood and accept all the possible adverse reactions/side effects. Wear Seat belts while driving. If you have smoked or chewed Tobacco in the last 2 yrs please stop smoking and/or stop any Recreational drug use.   Increase activity slowly    Complete by:  As directed     Discharge Exam: Filed Weights   02/02/16 0700  Weight: 75.5 kg (166 lb 7.2 oz)   Vitals:   02/05/16 2112 02/06/16 0425  BP: 98/66 125/88  Pulse: 71 68  Resp: 16 16  Temp: 98.3 F (36.8 C) 98.9 F (37.2 C)   General: Appear in no distress, no Rash; Oral Mucosa moist. Cardiovascular: S1 and S2 Present, no Murmur, no JVD Respiratory: Bilateral Air entry present and Clear to Auscultation, no Crackles, no wheezes Abdomen: Bowel Sound present, Soft and n tenderness Extremities: no Pedal edema, no calf tenderness Neurology: Grossly no focal neuro deficit. Chronic bilateral  paraplegia  The results of significant diagnostics from this hospitalization (including imaging, microbiology, ancillary and laboratory) are listed below for reference.    Significant Diagnostic Studies: Dg Chest 2 View  Result Date: 01/16/2016 CLINICAL DATA:  Subacute onset of generalized chest pain, radiating to the back. Initial encounter. EXAM: CHEST  2 VIEW COMPARISON:  Chest radiograph performed 01/08/2016 FINDINGS: Mild bilateral linear airspace opacities may reflect pneumonia or possibly atelectasis. No pleural effusion or pneumothorax is seen. The heart is normal in size. No acute osseous abnormalities are identified. IMPRESSION: Mild bilateral linear opacities may reflect pneumonia or possibly atelectasis.  Electronically Signed   By: Garald Balding M.D.   On: 01/16/2016 21:06   Dg Chest 2 View  Result Date: 01/08/2016 CLINICAL DATA:  Stabbing bilateral chest pain today. Shortness of breath 2 months. EXAM: CHEST  2 VIEW COMPARISON:  11/28/2015 FINDINGS: The cardiac silhouette, mediastinal and hilar contours are within normal limits and stable. There is tortuosity and calcification of the thoracic aorta. Underlying emphysematous changes and pulmonary scarring. Right lower lobe process appears to be a combination of atelectasis, infiltrate and small effusion. The bony thorax is intact. IMPRESSION: Right lower lobe process likely a combination of infiltrate, atelectasis and effusion. Electronically Signed   By: Marijo Sanes M.D.   On: 01/08/2016 21:02   Dg Lumbar Spine Complete  Result Date: 01/16/2016 CLINICAL DATA:  Subacute onset of lower back pain. Initial encounter. EXAM: LUMBAR SPINE - COMPLETE 4+ VIEW COMPARISON:  CT of the abdomen and pelvis from 04/13/2015 FINDINGS: There is no evidence of fracture or subluxation. Vertebral bodies demonstrate normal height. Degenerative disc space narrowing is noted along the lower lumbar spine, with scattered lateral osteophytes and endplate sclerotic  change. Associated facet disease is noted. The visualized bowel gas pattern is unremarkable in appearance; air and stool are noted within the colon. The sacroiliac joints are within normal limits. Bilateral hip arthroplasties are incompletely imaged but appear grossly unremarkable. IMPRESSION: No evidence of fracture or subluxation along the lumbar spine. Mild degenerative change along the lower lumbar spine. Electronically Signed   By: Garald Balding M.D.   On: 01/16/2016 21:22   Ct Angio Chest Pe W Or Wo Contrast  Result Date: 01/17/2016 CLINICAL DATA:  Chest pain and shortness of Breath EXAM: CT ANGIOGRAPHY CHEST WITH CONTRAST TECHNIQUE: Multidetector CT imaging of the chest was performed using the standard protocol during bolus administration of intravenous contrast. Multiplanar CT image reconstructions and MIPs were obtained to evaluate the vascular anatomy. CONTRAST:  100 mL Isovue 370 COMPARISON:  11/12/2015 FINDINGS: Mediastinum/Lymph Nodes: The thoracic inlet is within normal limits. No hilar or mediastinal adenopathy is identified. Cardiovascular: The thoracic aorta shows no evidence of aneurysmal dilatation or dissection. The pulmonary artery as visualized is somewhat limited by patient motion artifact although no gross central pulmonary embolus is noted. Lungs/Pleura: Again limited by patient respiratory artifact. Mild emphysematous changes are seen. Right lower lobe consolidation with associated small effusion is noted. No definitive parenchymal nodule are pneumothorax is seen. Upper abdomen: No acute findings. Musculoskeletal: Multiple lytic and sclerotic lesions are noted throughout the bony structures consistent with the given clinical history of metastatic disease. These appear slightly improved when compared with the prior exam consistent with therapy Review of the MIP images confirms the above findings. IMPRESSION: No evidence of pulmonary emboli although the examination is somewhat limited by  patient motion artifact. Right lower lobe infiltrate with associated small effusion. Changes of metastatic disease to the bones with some improvement when compared with the prior exam consistent with therapy. Electronically Signed   By: Inez Catalina M.D.   On: 01/17/2016 11:46   Mr Cervical Spine Wo Contrast  Result Date: 02/02/2016 CLINICAL DATA:  Paraplegia with loss of sensation from the mid back downward. Symptoms began 1 week ago. EXAM: MRI THORACIC SPINE WITHOUT CONTRAST; MRI CERVICAL SPINE WITHOUT CONTRAST TECHNIQUE: Multiplanar and multiecho pulse sequences of the thoracic spine were obtained without intravenous contrast.; Multiplanar and multiecho pulse sequences of the cervical spine, to include the craniocervical junction and cervicothoracic junction, were obtained according to standard protocol without intravenous  contrast. CONTRAST:  None. Contrast could not be given because patient received gadolinium 12 hours earlier for the lumbar MRI. COMPARISON:  Bone scan 04/13/2015. FINDINGS: CERVICAL: Alignment: Straightening of the normal cervical lordosis. Vertebrae: Widespread osseous metastatic disease. Endplate softening, due to a combination of degenerative disc disease and tumor, has resulted in mild diffuse loss of vertebral body height particularly from C3 through C6. No dominant compression fracture or retropulsed bony fragment. Metastatic disease is seen to the clivus. Cord: Mild cord flattening due to spondylosis at multiple levels described below. No definite epidural tumor in the cervical region. Posterior Fossa: Unremarkable. Vertebral Arteries: Patent. Paraspinal tissues: Unremarkable Disc levels: C2-3:  Annular bulge. C3-4: Central protrusion with stenosis. BILATERAL foraminal narrowing. Mild cord flattening. C4-5: Central protrusion with stenosis. BILATERAL foraminal narrowing. Mild cord flattening. C5-6: Annular bulge with osseous ridging. Central stenosis with BILATERAL foraminal  narrowing. Moderate cord flattening. C6-7: Central protrusion with stenosis. BILATERAL foraminal narrowing. Mild cord flattening. C7-T1:  Unremarkable. THORACIC: Numbering of the thoracic spine was accomplished by counting down the odontoid. Normal segmentation is present. Normal alignment. Widespread metastatic disease is seen in all thoracic vertebrae. Early loss of vertebral body height at T4 with slight retropulsion. Posterior element involvement most notable at T2. BILATERAL rib involvement most notable at T2, RIGHT greater than LEFT, T5, RIGHT greater than LEFT, along with BILATERAL pleural effusions representing metastatic disease outside the spinal axis. There is abnormal cord signal extending from C7-T1 through T2-T3 related to severe cord compression at the T2 level due to epidural tumor. The individual disc spaces are examined as follows: T1-2: Minor disc pathology. Epidural tumor surrounds the cord resulting in severe compression an abnormal central cord signal. A large deposit is present dorsally related to posterior element involvement. See image 9 series 12. T2-3: Minor disc pathology. Abnormal cord signal extends to the T2-3 interspace. Dorsal and ventral epidural tumor, greater on the RIGHT. T3-4:  RIGHT-sided epidural tumor without cord compression. T4-5: Pathologic T4 fracture. Epidural tumor extends ventrally and to the RIGHT resulting and stenosis and mild cord displacement. No abnormal cord signal. T5-6:  Unremarkable. T6-7:  Central and leftward protrusion.  No cord compression. T7-8:  Unremarkable disc space.  Early epidural tumor on the LEFT. T8-9:  Annular bulge. T9-10:  Unremarkable. T10-11:  Central protrusion. T11-12:  Annular bulge. IMPRESSION: Widespread metastatic disease throughout the cervical and thoracic spine. Early pathologic compression at T4. Mild diffuse loss of cervical vertebrae height C3-C6. Significant cord compression due to epidural tumor at the T2 level, with abnormal  cord signal. See discussion above. Extensive extra-spinal tumor, involving the RIGHT ribs, greatest at T5. Critical Value/emergent results were called by telephone at the time of interpretation on 02/02/2016 at 7:45 AM to Dr. Posey Pronto, who verbally acknowledged these results. Electronically Signed   By: Staci Righter M.D.   On: 02/02/2016 08:29   Mr Thoracic Spine Wo Contrast  Result Date: 02/02/2016 CLINICAL DATA:  Paraplegia with loss of sensation from the mid back downward. Symptoms began 1 week ago. EXAM: MRI THORACIC SPINE WITHOUT CONTRAST; MRI CERVICAL SPINE WITHOUT CONTRAST TECHNIQUE: Multiplanar and multiecho pulse sequences of the thoracic spine were obtained without intravenous contrast.; Multiplanar and multiecho pulse sequences of the cervical spine, to include the craniocervical junction and cervicothoracic junction, were obtained according to standard protocol without intravenous contrast. CONTRAST:  None. Contrast could not be given because patient received gadolinium 12 hours earlier for the lumbar MRI. COMPARISON:  Bone scan 04/13/2015. FINDINGS: CERVICAL: Alignment: Straightening of  the normal cervical lordosis. Vertebrae: Widespread osseous metastatic disease. Endplate softening, due to a combination of degenerative disc disease and tumor, has resulted in mild diffuse loss of vertebral body height particularly from C3 through C6. No dominant compression fracture or retropulsed bony fragment. Metastatic disease is seen to the clivus. Cord: Mild cord flattening due to spondylosis at multiple levels described below. No definite epidural tumor in the cervical region. Posterior Fossa: Unremarkable. Vertebral Arteries: Patent. Paraspinal tissues: Unremarkable Disc levels: C2-3:  Annular bulge. C3-4: Central protrusion with stenosis. BILATERAL foraminal narrowing. Mild cord flattening. C4-5: Central protrusion with stenosis. BILATERAL foraminal narrowing. Mild cord flattening. C5-6: Annular bulge with  osseous ridging. Central stenosis with BILATERAL foraminal narrowing. Moderate cord flattening. C6-7: Central protrusion with stenosis. BILATERAL foraminal narrowing. Mild cord flattening. C7-T1:  Unremarkable. THORACIC: Numbering of the thoracic spine was accomplished by counting down the odontoid. Normal segmentation is present. Normal alignment. Widespread metastatic disease is seen in all thoracic vertebrae. Early loss of vertebral body height at T4 with slight retropulsion. Posterior element involvement most notable at T2. BILATERAL rib involvement most notable at T2, RIGHT greater than LEFT, T5, RIGHT greater than LEFT, along with BILATERAL pleural effusions representing metastatic disease outside the spinal axis. There is abnormal cord signal extending from C7-T1 through T2-T3 related to severe cord compression at the T2 level due to epidural tumor. The individual disc spaces are examined as follows: T1-2: Minor disc pathology. Epidural tumor surrounds the cord resulting in severe compression an abnormal central cord signal. A large deposit is present dorsally related to posterior element involvement. See image 9 series 12. T2-3: Minor disc pathology. Abnormal cord signal extends to the T2-3 interspace. Dorsal and ventral epidural tumor, greater on the RIGHT. T3-4:  RIGHT-sided epidural tumor without cord compression. T4-5: Pathologic T4 fracture. Epidural tumor extends ventrally and to the RIGHT resulting and stenosis and mild cord displacement. No abnormal cord signal. T5-6:  Unremarkable. T6-7:  Central and leftward protrusion.  No cord compression. T7-8:  Unremarkable disc space.  Early epidural tumor on the LEFT. T8-9:  Annular bulge. T9-10:  Unremarkable. T10-11:  Central protrusion. T11-12:  Annular bulge. IMPRESSION: Widespread metastatic disease throughout the cervical and thoracic spine. Early pathologic compression at T4. Mild diffuse loss of cervical vertebrae height C3-C6. Significant cord  compression due to epidural tumor at the T2 level, with abnormal cord signal. See discussion above. Extensive extra-spinal tumor, involving the RIGHT ribs, greatest at T5. Critical Value/emergent results were called by telephone at the time of interpretation on 02/02/2016 at 7:45 AM to Dr. Posey Pronto, who verbally acknowledged these results. Electronically Signed   By: Staci Righter M.D.   On: 02/02/2016 08:29   Mr Lumbar Spine W Wo Contrast  Result Date: 02/01/2016 CLINICAL DATA:  Prostate cancer with widespread bony metastatic disease. The patient is currently unable to move his legs. Weakness. EXAM: MRI LUMBAR SPINE WITHOUT AND WITH CONTRAST TECHNIQUE: Multiplanar and multiecho pulse sequences of the lumbar spine were obtained without and with intravenous contrast. CONTRAST:  40mL MULTIHANCE GADOBENATE DIMEGLUMINE 529 MG/ML IV SOLN COMPARISON:  Multiple exams, including Pete/ 15/17 and 04/13/2015 FINDINGS: Segmentation: The lowest lumbar type non-rib-bearing vertebra is labeled as L5. Alignment: Dextroconvex lumbar scoliosis with significant rotary component. Vertebrae: Extensive marrow heterogeneity and patchy enhancement throughout the visualized lumbar spine and sacrum compatible with widespread osseous metastatic disease. This is probably superimposed on underlying chronic marrow heterogeneity. Most notable tumor involvement is in the spinous process of L2 where there is extraosseous  extension of tumor suspected along the paraspinal tissues along with cortical demineralization of the L2 spinous process. Involvement results in a very small amount of epidural tumor along the posterior epidural space for example image 14/10. Disc desiccation and loss of disc height at all levels in the lumbar spine. 3 mm grade 1 anterolisthesis at L3-4. Conus medullaris: Extends to the L1 level and appears normal. Paraspinal and other soft tissues: Low-grade presacral edema, image 14/6. Low-level edema in the paraspinal  musculature. Left periaortic node 1.2 cm in short axis on image 15/10. Disc levels: T12-L1:  No impingement.  Diffuse disc bulge. L1-2: Mild displacement of the left L1 nerve in the lateral extraforaminal space and borderline left subarticular lateral recess stenosis due to left eccentric disc bulge and left lateral extraforaminal disc protrusion. Mild left facet arthropathy. L2-3: Mild left foraminal stenosis and mild displacement of the right L2 nerve in the lateral extraforaminal space due to disc bulge and facet arthropathy. L3-4: Moderate left foraminal stenosis and mild left subarticular lateral recess stenosis due to disc uncovering, intervertebral spurring, facet arthropathy, and disc bulge. L4-5: Moderate to prominent right and mild left foraminal stenosis due to disc bulge, facet arthropathy, and intervertebral spurring. L5-S1: Moderate bilateral foraminal stenosis and mild central narrowing of the thecal sac due to facet arthropathy, intervertebral spurring, and disc bulge. IMPRESSION: 1. Widespread osseous metastatic disease and resulting marrow heterogeneity and heterogeneous enhancement. The only area of apparent destructive metastatic disease is in the L2 spinous process were there some extraosseous extension of tumor and a very small amount of posterior epidural tumor. 2. Lumbar scoliosis, spondylosis, and degenerative disc disease, causing moderate to prominent impingement at L4-5 ; moderate impingement at L3-4 and L5-S1; and mild impingement at L1- 2 and L2- 3, as detailed above. Electronically Signed   By: Van Clines M.D.   On: 02/01/2016 19:22   Dg Chest Portable 1 View  Result Date: 02/01/2016 CLINICAL DATA:  Pt has a hx of metastatic prostate cancer who presents today with weakness and numbness in his legs. Hx of COPD, hypertension, pneumonia. EXAM: PORTABLE CHEST 1 VIEW COMPARISON:  01/16/2016 FINDINGS: Cardiac silhouette is normal in size. No mediastinal or hilar masses. There are  irregularly thickened interstitial markings bilaterally. Airspace opacity is noted in the left mid lung and both lung bases. The findings may be stable from the prior study when allowing for differences in technique and lower lung volumes on the current exam. No convincing pleural effusion.  No pneumothorax. Multiple sclerotic bone lesions consistent with widespread metastatic prostate carcinoma. IMPRESSION: 1. Bilateral interstitial thickening with some hazy left mid and bilateral lower lung zone airspace opacities. Findings may be due to chronic interstitial thickening with areas of atelectasis. Pneumonia should be considered if there are consistent clinical symptoms. Findings accentuated by low lung volumes. No convincing pulmonary edema. Electronically Signed   By: Lajean Manes M.D.   On: 02/01/2016 20:09    Microbiology: No results found for this or any previous visit (from the past 240 hour(s)).   Labs: CBC:  Recent Labs Lab 02/01/16 1607  02/02/16 0356  02/03/16 0740 02/03/16 2049 02/04/16 0418 02/05/16 0506 02/06/16 0404  WBC 5.7  < > 6.3  < > 8.1 9.4 9.7 11.1* 12.0*  NEUTROABS 3.8  --  4.0  --   --   --   --   --   --   HGB 10.5*  < > 9.9*  < > 10.8* 10.4* 10.0* 11.4* 10.9*  HCT 32.7*  < > 30.6*  < > 34.2* 32.0* 30.4* 32.9* 33.6*  MCV 84.1  < > 83.8  < > 83.8 82.5 81.9 79.5 80.2  PLT 218  < > 200  < > 255 263 255 269 292  < > = values in this interval not displayed. Basic Metabolic Panel:  Recent Labs Lab 02/02/16 0356 02/03/16 0740 02/04/16 0900 02/05/16 0506 02/06/16 0404  NA 135 133* 132* 134* 131*  K 4.4 4.0 3.9 4.1 4.5  CL 111 106 108 109 109  CO2 20* 19* 16* 18* 18*  GLUCOSE 100* 168* 216* 127* 123*  BUN 28* 31* 31* 36* 31*  CREATININE 0.93 0.94 0.95 0.77 0.83  CALCIUM 8.6* 8.9 8.5* 8.6* 8.3*   Liver Function Tests:  Recent Labs Lab 02/01/16 1607 02/02/16 0356  AST 60* 46*  ALT 16* 16*  ALKPHOS 1,719* 1,745*  BILITOT 0.3 0.3  PROT 6.2* 5.8*   ALBUMIN 3.2* 2.9*   No results for input(s): LIPASE, AMYLASE in the last 168 hours. No results for input(s): AMMONIA in the last 168 hours. Cardiac Enzymes:  Recent Labs Lab 02/01/16 1607  TROPONINI <0.03   BNP (last 3 results)  Recent Labs  01/16/16 2314  BNP 139.7*   CBG: No results for input(s): GLUCAP in the last 168 hours. Time spent: 30 minutes  Signed:  Berle Mull  [facility]  02/06/2016  , 2:54 PM

## 2016-02-06 NOTE — NC FL2 (Signed)
[place] LEVEL OF CARE SCREENING TOOL     IDENTIFICATION  Patient Name: Billy Lopez. Birthdate: [birth year] Sex: male Admission Date (Current Location): 02/01/2016  [facility] and [place] Number:  Herbalist and Address:  [facility],  [place] [address] [place]      Provider Number: [id number]  Attending Physician Name and Address:  Lavina Hamman, MD  Relative Name and Phone Number:       Current Level of Care: Hospital Recommended Level of Care: [place] Prior Approval Number:    Date Approved/Denied:   PASRR Number:    Discharge Plan: SNF    Current Diagnoses: Patient Active Problem List   Diagnosis Date Noted  . Bone metastases ([place]) 02/05/2016  . Malnutrition of moderate degree 02/02/2016  . Cord compression ([place]) 02/02/2016  . Intractable pain 02/01/2016  . Back pain 02/01/2016  . Lower extremity weakness 02/01/2016  . Absolute anemia   . Pneumonia 01/17/2016  . Abdominal pain 01/17/2016  . Atypical chest pain 11/28/2015  . Generalized pain   . Palliative care encounter   . CAP (community acquired pneumonia) 11/12/2015  . BRBPR (bright red blood per rectum) 11/12/2015  . Prostate cancer metastatic to bone ([place]) 08/30/2015  . CKD (chronic kidney disease) stage 3, GFR 30-59 ml/min 06/28/2015  . Pseudogout   . Septic joint of left shoulder region ([place]) 06/15/2015  . Arthritis, shoulder region 06/14/2015  . Left shoulder pain 06/14/2015  . Peripheral edema 11/09/2014  . Constipation 11/09/2014  . Periprosthetic fracture around internal prosthetic left hip joint ([place]) 10/04/2014  . Cough 04/19/2014  . Crossover toe 03/31/2014  . Encounter for long-term (current) use of medications 03/30/2014  . Screening examination for venereal disease 03/30/2014  . Cocked-up toe of right foot 10/14/2013  . Arthritis 06/17/2013  . 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 Hematochezia 10/14/2012  . Hx of radiation  therapy   . Stage T2a adenocarcinoma of the prostate with a Gleason's score of 3+4 and a PSA of 5.61 10/14/2011  . 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 Chronic pain 07/26/2011  . Preventative health care 07/21/2011  . 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 Allergic rhinitis, cause unspecified 07/21/2011  . Cervical spondylosis 07/21/2011  . Lumbar spondylosis 07/21/2011  . Osteoarthritis 07/21/2011  . COPD (chronic obstructive pulmonary disease) ([place]) 07/21/2011  . Substance abuse 03/22/2011  . ROTATOR CUFF SYNDROME, RIGHT 07/09/2010  . 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 DECREASED HEARING, BILATERAL 09/08/2008  . Human immunodeficiency virus (HIV) disease ([place]) 10/30/2006  . Chronic hepatitis C without hepatic coma ([place]) 10/30/2006  . Essential hypertension 10/30/2006  . LOW BACK PAIN SYNDROME 10/30/2006  . Hepatitis B 10/30/2006    Orientation RESPIRATION BLADDER Height & Weight     Self, Time, Situation, Place  Normal Continent Weight: 166 lb 7.2 oz (75.5 kg) Height:  5\' 11"  (180.3 cm)  BEHAVIORAL SYMPTOMS/MOOD NEUROLOGICAL BOWEL NUTRITION STATUS  Other (Comment) (no behaviors)   Continent Diet  AMBULATORY STATUS COMMUNICATION OF NEEDS Skin   Limited Assist Verbally Normal                       Personal Care Assistance Level of Assistance  Bathing, Feeding, Dressing Bathing Assistance: Limited assistance Feeding assistance: Independent Dressing Assistance: Limited assistance     Functional Limitations Info  Sight, Hearing, Speech Sight Info: Impaired Hearing Info: Adequate Speech Info: Adequate    SPECIAL CARE FACTORS FREQUENCY  PT (By licensed PT)     PT Frequency: 5x wk              Contractures Contractures Info: Not present    Additional Factors Info  Code Status Code Status Info: Full Code             Current Medications (02/06/2016):  This is the current hospital active medication list Current Facility-Administered Medications   Medication Dose Route Frequency Provider Last Rate Last Dose  . acetaminophen (TYLENOL) tablet 650 mg  650 mg Oral Q6H PRN Rise Patience, MD       Or  . acetaminophen (TYLENOL) suppository 650 mg  650 mg Rectal Q6H PRN Rise Patience, MD      . bisacodyl (DULCOLAX) suppository 10 mg  10 mg Rectal Daily PRN Rise Patience, MD      . cholecalciferol (VITAMIN D) tablet 1,000 Units  1,000 Units Oral Daily Rise Patience, MD   1,000 Units at 02/06/16 [phone]  . dexamethasone (DECADRON) injection 8 mg  8 mg Intravenous Q6H Lavina Hamman, MD   8 mg at 02/06/16 1244  . dolutegravir (TIVICAY) tablet 50 mg  50 mg Oral Daily Rise Patience, MD   50 mg at 02/05/16 2156  . emtricitabine-tenofovir AF (DESCOVY) 200-25 MG per tablet 1 tablet  1 tablet Oral QHS Rise Patience, MD   1 tablet at 02/05/16 2156  . enalapril (VASOTEC) tablet 20 mg  20 mg Oral Daily Rise Patience, MD   20 mg at 02/06/16 0949  . feeding supplement (ENSURE ENLIVE) (ENSURE ENLIVE) liquid 237 mL  237 mL Oral BID BM Rise Patience, MD   237 mL at 02/06/16 1336  . fentaNYL (DURAGESIC - dosed mcg/hr) 50 mcg  50 mcg Transdermal Q72H Rise Patience, MD   50 mcg at 02/04/16 2211  . furosemide (LASIX) tablet 20 mg  20 mg Oral Daily Rise Patience, MD   20 mg at 02/06/16 0934  . HYDROcodone-acetaminophen (NORCO/VICODIN) 5-325 MG per tablet 1-2 tablet  1-2 tablet Oral Q4H PRN Lavina Hamman, MD   2 tablet at 02/04/16 2034  . hydrocortisone (ANUSOL-HC) 2.5 % rectal cream   Rectal BID Lavina Hamman, MD      . HYDROmorphone (DILAUDID) injection 0.5 mg  0.5 mg Intravenous Q4H PRN Lavina Hamman, MD   0.5 mg at 02/06/16 1333  . magnesium hydroxide (MILK OF MAGNESIA) suspension 30 mL  30 mL Oral Daily PRN Rise Patience, MD      . ondansetron [facility]) tablet 4 mg  4 mg Oral Q6H PRN Rise Patience, MD       Or  . ondansetron [facility]) injection 4 mg  4 mg Intravenous Q6H PRN Rise Patience,  MD      . polyethylene glycol (MIRALAX / GLYCOLAX) packet 17 g  17 g Oral Daily Lavina Hamman, MD   17 g at 02/06/16 0933  . potassium chloride (K-DUR,KLOR-CON) CR tablet 10 mEq  10 mEq Oral Daily Rise Patience, MD   10 mEq at 02/06/16 0934  . senna-docusate (Senokot-S) tablet 2 tablet  2 tablet Oral BID Lavina Hamman, MD   2 tablet at 02/06/16 [id number]     Discharge Medications: Please see discharge summary for a list of discharge medications.  Relevant Imaging Results:  Relevant Lab Results:   Additional Information ss#[id number]. Pt has radiation appts. at [facility]. Schedule included in d/c  packet.  Robyne Matar, Randall An, LCSW

## 2016-02-06 NOTE — Care Management Important Message (Signed)
Important Message  Patient Details  Name: Billy Lopez. MRN: [id number] Date of Birth: December 25, 1940   Medicare Important Message Given:  Yes    Camillo Flaming 02/06/2016, 9:27 AMImportant Message  Patient Details  Name: [name]. MRN: [id number] Date of Birth: [birth year]   Medicare Important Message Given:  Yes    Camillo Flaming 02/06/2016, 9:27 AM

## 2016-02-07 ENCOUNTER — Encounter: Payer: Self-pay | Admitting: Radiation Oncology

## 2016-02-07 ENCOUNTER — Ambulatory Visit
Admission: RE | Admit: 2016-02-07 | Discharge: 2016-02-07 | Disposition: A | Discharge status: Home / Self Care | Payer: Commercial Managed Care - HMO | Source: Ambulatory Visit | Attending: Radiation Oncology | Admitting: Radiation Oncology

## 2016-02-07 ENCOUNTER — Other Ambulatory Visit: Payer: Self-pay

## 2016-02-07 VITALS — BP 138/96 | HR 83

## 2016-02-07 VITALS — BP 100/79 | HR 76 | Temp 98.4°F | Resp 16 | Ht 71.0 in

## 2016-02-07 DIAGNOSIS — M549 Dorsalgia, unspecified: Secondary | ICD-10-CM | POA: Diagnosis not present

## 2016-02-07 DIAGNOSIS — C7951 Secondary malignant neoplasm of bone: Secondary | ICD-10-CM

## 2016-02-07 DIAGNOSIS — G893 Neoplasm related pain (acute) (chronic): Secondary | ICD-10-CM | POA: Diagnosis not present

## 2016-02-07 DIAGNOSIS — Z923 Personal history of irradiation: Secondary | ICD-10-CM | POA: Diagnosis not present

## 2016-02-07 MED ORDER — HYDROMORPHONE HCL 2 MG PO TABS: 2.0000 mg | ORAL_TABLET | ORAL | Status: DC

## 2016-02-07 MED ORDER — HYDROMORPHONE HCL 2 MG PO TABS
2.0000 mg | ORAL_TABLET | Freq: Once | ORAL | Status: AC
  Administered 2016-02-07: 2 mg via ORAL
  Filled 2016-02-07: qty 1

## 2016-02-07 MED ORDER — LIDOCAINE VISCOUS 2 % MT SOLN: OROMUCOSAL | 5 refills | Status: AC

## 2016-02-07 NOTE — Progress Notes (Signed)
Mr. Billy Lopez has received 2 fractions to the spine Cl-C5,C6-T5, the spine.  Skin has normal color today.  Having pain 7/10 to back and all over was given Dilaudid 2 mg since arriving here today with some relief.  Appetite is good.  Denies fatigue. Mr. Billy Lopez is  a resident a [facility]. Wt Readings from Last 3 Encounters:  02/02/16 166 lb 7.2 oz (75.5 kg)  01/21/16 144 lb 10 oz (65.6 kg)  01/16/16 168 lb (76.2 kg)  BP 100/79 (BP Location: Right Arm, Patient Position: Sitting, Cuff Size: Normal)   Pulse 76   Temp 98.4 F (36.9 C) (Oral)   Resp 16   Ht 5\' 11"  (1.803 m)   SpO2 100%

## 2016-02-07 NOTE — Progress Notes (Signed)
   Weekly Management Note:  outpatient    ICD-9-CM ICD-10-CM   1. Bone metastases (HCC) 198.5 C79.51 lidocaine (XYLOCAINE) 2 % solution    Current Dose:  6 Gy to Upper C spine / 8 Gy  To lower C spine and upper T spine Projected Dose: 30 Gy to Upper C spine  / 20 Gy To lower C spine and upper T spine   Narrative:  The patient presents for routine under treatment assessment.  CBCT/MVCT images/Port film x-rays were reviewed.  The chart was checked. The patient has received 2 fractions to the spine C1-C5, C6-T5. Skin has a normal color today. Patient reports having 7/10 pain to back and all over. He was given 2 mg Dilaudid since arriving today with some relief. Appetite is good. Mr. Boersma is a resident of [facility]. He complains they are often late in administering his medicine.  Physical Findings:  Wt Readings from Last 3 Encounters:  02/02/16 166 lb 7.2 oz (75.5 kg)  01/21/16 144 lb 10 oz (65.6 kg)  01/16/16 168 lb (76.2 kg)    height is 5\' 11"  (1.803 m). His oral temperature is 98.4 F (36.9 C). His blood pressure is 100/79 and his pulse is 76. His respiration is 16 and oxygen saturation is 100%.   In wheelchair, NAD   CBC    Component Value Date/Time   WBC 12.0 (H) 02/06/2016 0404   RBC 4.19 (L) 02/06/2016 0404   HGB 10.9 (L) 02/06/2016 0404   HCT 33.6 (L) 02/06/2016 0404   PLT 292 02/06/2016 0404   MCV 80.2 02/06/2016 0404   MCH 26.0 02/06/2016 0404   MCHC 32.4 02/06/2016 0404   RDW 16.0 (H) 02/06/2016 0404   LYMPHSABS 1.3 02/02/2016 0356   MONOABS 0.9 02/02/2016 0356   EOSABS 0.1 02/02/2016 0356   BASOSABS 0.0 02/02/2016 0356     CMP     Component Value Date/Time   NA 131 (L) 02/06/2016 0404   K 4.5 02/06/2016 0404   CL 109 02/06/2016 0404   CO2 18 (L) 02/06/2016 0404   GLUCOSE 123 (H) 02/06/2016 0404   BUN 31 (H) 02/06/2016 0404   CREATININE 0.83 02/06/2016 0404   CREATININE 1.24 (H) 06/14/2015 1019   CALCIUM 8.3 (L) 02/06/2016 0404   PROT 5.8 (L) 02/02/2016 0356   ALBUMIN 2.9 (L) 02/02/2016 0356   AST 46 (H) 02/02/2016 0356   ALT 16 (L) 02/02/2016 0356   ALKPHOS 1,745 (H) 02/02/2016 0356   BILITOT 0.3 02/02/2016 0356   GFRNONAA >60 02/06/2016 0404   GFRNONAA 58 (L) 06/14/2015 1019   GFRAA >60 02/06/2016 0404   GFRAA 67 06/14/2015 1019     Impression:  The patient is tolerating radiotherapy.   Plan:  Continue radiotherapy as planned.   Refer back to Dr. Alen Blew.  I will send him a note. Lidocaine MW Rx in case esophagitis develops.  Note written to nursing home to offer PRN pain meds around the clock to the patient.  -----------------------------------  Eppie Gibson, MD This document serves as a record of services personally performed by Eppie Gibson, MD. It was created on her behalf by Bethann Humble, a trained medical scribe. The creation of this record is based on the scribe's personal observations and the provider's statements to them. This document has been checked and approved by the attending provider.

## 2016-02-08 ENCOUNTER — Ambulatory Visit
Admission: RE | Admit: 2016-02-08 | Discharge: 2016-02-08 | Disposition: A | Discharge status: Home / Self Care | Payer: Commercial Managed Care - HMO | Source: Ambulatory Visit | Attending: Radiation Oncology | Admitting: Radiation Oncology

## 2016-02-08 ENCOUNTER — Telehealth: Payer: Self-pay

## 2016-02-08 DIAGNOSIS — I10 Essential (primary) hypertension: Secondary | ICD-10-CM | POA: Diagnosis not present

## 2016-02-08 DIAGNOSIS — M549 Dorsalgia, unspecified: Secondary | ICD-10-CM | POA: Diagnosis not present

## 2016-02-08 DIAGNOSIS — C61 Malignant neoplasm of prostate: Secondary | ICD-10-CM | POA: Diagnosis not present

## 2016-02-08 DIAGNOSIS — R5381 Other malaise: Secondary | ICD-10-CM | POA: Diagnosis not present

## 2016-02-08 DIAGNOSIS — G893 Neoplasm related pain (acute) (chronic): Secondary | ICD-10-CM | POA: Diagnosis not present

## 2016-02-08 DIAGNOSIS — Z923 Personal history of irradiation: Secondary | ICD-10-CM | POA: Diagnosis not present

## 2016-02-08 DIAGNOSIS — R2689 Other abnormalities of gait and mobility: Secondary | ICD-10-CM | POA: Diagnosis not present

## 2016-02-08 DIAGNOSIS — G8929 Other chronic pain: Secondary | ICD-10-CM | POA: Diagnosis not present

## 2016-02-08 DIAGNOSIS — B20 Human immunodeficiency virus [HIV] disease: Secondary | ICD-10-CM | POA: Diagnosis not present

## 2016-02-08 DIAGNOSIS — C7951 Secondary malignant neoplasm of bone: Secondary | ICD-10-CM | POA: Diagnosis not present

## 2016-02-08 DIAGNOSIS — R29898 Other symptoms and signs involving the musculoskeletal system: Secondary | ICD-10-CM | POA: Diagnosis not present

## 2016-02-08 NOTE — Telephone Encounter (Signed)
I called [facility] today to inform them of a prescription written for 2% lidocaine for Billy Lopez, by Dr. Isidore Moos. I spoke with Randell Patient (his nurse) and she requested I fax the prescription details to her, which I did. I informed her that the prescription was for possible esophagitis he may get from the Radiation he is receiving. She voiced her understanding and knows to contact me if she has any further questions.

## 2016-02-09 ENCOUNTER — Ambulatory Visit
Admission: RE | Admit: 2016-02-09 | Discharge: 2016-02-09 | Disposition: A | Discharge status: Home / Self Care | Payer: Commercial Managed Care - HMO | Source: Ambulatory Visit | Attending: Radiation Oncology | Admitting: Radiation Oncology

## 2016-02-09 DIAGNOSIS — M549 Dorsalgia, unspecified: Secondary | ICD-10-CM | POA: Diagnosis not present

## 2016-02-09 DIAGNOSIS — Z923 Personal history of irradiation: Secondary | ICD-10-CM | POA: Diagnosis not present

## 2016-02-09 DIAGNOSIS — G893 Neoplasm related pain (acute) (chronic): Secondary | ICD-10-CM | POA: Diagnosis not present

## 2016-02-09 DIAGNOSIS — G619 Inflammatory polyneuropathy, unspecified: Secondary | ICD-10-CM | POA: Diagnosis not present

## 2016-02-09 DIAGNOSIS — C7951 Secondary malignant neoplasm of bone: Secondary | ICD-10-CM | POA: Diagnosis not present

## 2016-02-09 MED FILL — DESCOVY 200-25 MG TABS: 200-25 | 30 days supply | Qty: 30 | Fill #2

## 2016-02-09 MED FILL — TIVICAY 50 MG TABLET: 50 | 30 days supply | Qty: 30 | Fill #2

## 2016-02-12 ENCOUNTER — Ambulatory Visit
Admission: RE | Admit: 2016-02-12 | Discharge: 2016-02-12 | Disposition: A | Discharge status: Home / Self Care | Payer: Commercial Managed Care - HMO | Source: Ambulatory Visit | Attending: Radiation Oncology | Admitting: Radiation Oncology

## 2016-02-12 DIAGNOSIS — Z923 Personal history of irradiation: Secondary | ICD-10-CM | POA: Diagnosis not present

## 2016-02-12 DIAGNOSIS — C7951 Secondary malignant neoplasm of bone: Secondary | ICD-10-CM | POA: Diagnosis not present

## 2016-02-12 DIAGNOSIS — M549 Dorsalgia, unspecified: Secondary | ICD-10-CM | POA: Diagnosis not present

## 2016-02-12 DIAGNOSIS — G893 Neoplasm related pain (acute) (chronic): Secondary | ICD-10-CM | POA: Diagnosis not present

## 2016-02-13 ENCOUNTER — Ambulatory Visit
Admission: RE | Admit: 2016-02-13 | Discharge: 2016-02-13 | Disposition: A | Discharge status: Home / Self Care | Payer: Commercial Managed Care - HMO | Source: Ambulatory Visit | Attending: Radiation Oncology | Admitting: Radiation Oncology

## 2016-02-13 DIAGNOSIS — G893 Neoplasm related pain (acute) (chronic): Secondary | ICD-10-CM | POA: Diagnosis not present

## 2016-02-13 DIAGNOSIS — M549 Dorsalgia, unspecified: Secondary | ICD-10-CM | POA: Diagnosis not present

## 2016-02-13 DIAGNOSIS — C7951 Secondary malignant neoplasm of bone: Secondary | ICD-10-CM | POA: Diagnosis not present

## 2016-02-13 DIAGNOSIS — Z923 Personal history of irradiation: Secondary | ICD-10-CM | POA: Diagnosis not present

## 2016-02-14 ENCOUNTER — Encounter: Payer: Self-pay | Admitting: Radiation Oncology

## 2016-02-14 ENCOUNTER — Ambulatory Visit
Admission: RE | Admit: 2016-02-14 | Discharge: 2016-02-14 | Disposition: A | Discharge status: Home / Self Care | Payer: Commercial Managed Care - HMO | Source: Ambulatory Visit | Attending: Radiation Oncology | Admitting: Radiation Oncology

## 2016-02-14 ENCOUNTER — Ambulatory Visit (HOSPITAL_BASED_OUTPATIENT_CLINIC_OR_DEPARTMENT_OTHER): Payer: Commercial Managed Care - HMO | Admitting: Oncology

## 2016-02-14 VITALS — BP 113/78 | HR 46 | Temp 99.7°F

## 2016-02-14 VITALS — BP 109/74 | HR 92 | Temp 99.9°F | Resp 17

## 2016-02-14 DIAGNOSIS — C7951 Secondary malignant neoplasm of bone: Secondary | ICD-10-CM | POA: Diagnosis not present

## 2016-02-14 DIAGNOSIS — C61 Malignant neoplasm of prostate: Secondary | ICD-10-CM | POA: Diagnosis not present

## 2016-02-14 DIAGNOSIS — G893 Neoplasm related pain (acute) (chronic): Secondary | ICD-10-CM

## 2016-02-14 DIAGNOSIS — M549 Dorsalgia, unspecified: Secondary | ICD-10-CM | POA: Diagnosis not present

## 2016-02-14 DIAGNOSIS — Z923 Personal history of irradiation: Secondary | ICD-10-CM | POA: Diagnosis not present

## 2016-02-14 MED ORDER — ABIRATERONE ACETATE 250 MG PO TABS: 1000.0000 mg | ORAL_TABLET | Freq: Every day | ORAL | 0 refills | Status: DC

## 2016-02-14 NOTE — Progress Notes (Signed)
Billy Lopez is here for his 7th fraction of radiation to his C1-C5 Spine and C6- T5 Spine. He reports pain a 8/10 to his back, shoulders. He currently has a 50 mcg Fentanyl patch in place, and will take Dilaudid for breakthrough pain. He reports bilateral Lower extremity edema. He reports he is eating well. He does report some weakness and fatigue. He feels tired working with Physical Therapy at [facility]. He reports he is incontinent of urine and stool, which has been happening chronically.   BP 113/78   Pulse (!) 46   Temp 99.7 F (37.6 C)   SpO2 93% Comment: room air   Wt Readings from Last 3 Encounters:  02/02/16 166 lb 7.2 oz (75.5 kg)  01/21/16 144 lb 10 oz (65.6 kg)  01/16/16 168 lb (76.2 kg)

## 2016-02-14 NOTE — Progress Notes (Signed)
Prescription for Zytiga taken to the oral chemo pharmacist.

## 2016-02-14 NOTE — Progress Notes (Signed)
   Weekly Management Note:  outpatient    ICD-9-CM ICD-10-CM   1. Bone metastases (HCC) 198.5 C79.51     Current Dose: 21 Gy to Upper C spine /20 Gy  To lower C spine and upper T spine Projected Dose: 30 Gy to Upper C spine  / 20 Gy To lower C spine and upper T spine   Narrative:  The patient presents for routine under treatment assessment.  CBCT/MVCT images/Port film x-rays were reviewed.  The chart was checked.   Billy Lopez is here for his 7th fraction to his C1-C5 spine and C6-T5 spine. He reports 8/10 pain to his back and shoulders. He currently has a 50 mcg Fentanyl patch in place, and will take Dilautid for breakthrough pain. He reports bilateral lower extremity edema. He reports he is eating well. He does reports some weakness and fatigue. He feels tired working with physical therapy at [facility]. He reports he is incontinent of urine and stool, which has been happening chronically. No esophagitis  Physical Findings:  Wt Readings from Last 3 Encounters:  02/02/16 166 lb 7.2 oz (75.5 kg)  01/21/16 144 lb 10 oz (65.6 kg)  01/16/16 168 lb (76.2 kg)    temperature is 99.7 F (37.6 C). His blood pressure is 113/78 and his pulse is 46 (abnormal). His oxygen saturation is 93%.   In wheelchair, NAD No oral thrush No skin irritation over upper back   CBC    Component Value Date/Time   WBC 12.0 (H) 02/06/2016 0404   RBC 4.19 (L) 02/06/2016 0404   HGB 10.9 (L) 02/06/2016 0404   HCT 33.6 (L) 02/06/2016 0404   PLT 292 02/06/2016 0404   MCV 80.2 02/06/2016 0404   MCH 26.0 02/06/2016 0404   MCHC 32.4 02/06/2016 0404   RDW 16.0 (H) 02/06/2016 0404   LYMPHSABS 1.3 02/02/2016 0356   MONOABS 0.9 02/02/2016 0356   EOSABS 0.1 02/02/2016 0356   BASOSABS 0.0 02/02/2016 0356     CMP     Component Value Date/Time   NA 131 (L) 02/06/2016 0404   K 4.5 02/06/2016 0404   CL 109 02/06/2016 0404   CO2 18 (L) 02/06/2016 0404   GLUCOSE 123 (H) 02/06/2016 0404   BUN 31 (H)  02/06/2016 0404   CREATININE 0.83 02/06/2016 0404   CREATININE 1.24 (H) 06/14/2015 1019   CALCIUM 8.3 (L) 02/06/2016 0404   PROT 5.8 (L) 02/02/2016 0356   ALBUMIN 2.9 (L) 02/02/2016 0356   AST 46 (H) 02/02/2016 0356   ALT 16 (L) 02/02/2016 0356   ALKPHOS 1,745 (H) 02/02/2016 0356   BILITOT 0.3 02/02/2016 0356   GFRNONAA >60 02/06/2016 0404   GFRNONAA 58 (L) 06/14/2015 1019   GFRAA >60 02/06/2016 0404   GFRAA 67 06/14/2015 1019     Impression:  The patient is tolerating radiotherapy.   Plan:  Continue radiotherapy as planned. Our nurse will try to get an update of medication list at nursing home - Decadron taper needed? I don't see decadron on his d/c summary.Marland Kitchen   -----------------------------------  Billy Gibson, MD This document serves as a record of services personally performed by Billy Gibson, MD. It was created on her behalf by Bethann Humble, a trained medical scribe. The creation of this record is based on the scribe's personal observations and the provider's statements to them. This document has been checked and approved by the attending provider.

## 2016-02-14 NOTE — Progress Notes (Signed)
Hematology and Oncology Follow Up Visit  Billy Lopez [id number] [birth year] 75 y.o. 02/14/2016 2:36 PM Billy Lopez, MDJohn, Billy Oris, MD   Principle Diagnosis: 75 year old gentleman with prostate cancer. He was initially diagnosed in 2003 with a Gleason score 4+3 = 7 and a PSA 5.6. 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   Prior Therapy:  He received definitive radiation therapy with IMRT concluded in September 2013. He developed metastatic bony disease in November 2016 with a PSA of 256. 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  Current therapy:  Lupron androgen deprivation given at [facility]. He is receiving palliative radiation therapy to the thoracic spine. Under evaluation for second line hormone therapy due to progression of disease.  Interim History: Billy Lopez presents today for a follow-up visit. Since the last visit, he was hospitalized on 02/01/2016 because of neurological weakness. He was found to have a T2 cord compression and currently undergoing radiation therapy. He was discharged to acute rehabilitation where he is receiving physical therapy at this time. Despite therapy, he continues to be weak and unable to use his lower extremities. He is utilizing a motorized scooter at this time and he is upper body strength remained reasonable.  He continues to have issues with diffuse bone pain in the cervical, lumbar and pelvic area. His appetite remains reasonable and has not reported any decline in his energy. His quality of life is dramatically changed and likely will not be able to go home after completing rehabilitation.   He does not report any headaches, blurry vision, syncope or seizures. He does not report any fevers, chills, sweats or weight loss. He does not report any chest pain, palpitation or orthopnea. Does not report any cough, wheezing or hemoptysis. Does not report any nausea, vomiting, abdominal pain.  Does not report any hematochezia or melena. Does not report any frequency urgency or hesitancy. Remaining review of systems unremarkable.   Medications: I have reviewed the patient's current medications.  Current Outpatient Prescriptions  Medication Sig Dispense Refill  . abiraterone Acetate (ZYTIGA) 250 MG tablet Take 4 tablets (1,000 mg total) by mouth daily. Take on an empty stomach 1 hour before or 2 hours after a meal 120 tablet 0  . acetaminophen (TYLENOL) 325 MG tablet Take 2 tablets (650 mg total) by mouth every 6 (six) hours as needed for mild pain (or Fever >/= 101). (Patient not taking: Reported on 02/07/2016) 30 tablet 0  . bisacodyl (DULCOLAX) 10 MG suppository Place 10 mg rectally daily as needed for mild constipation or moderate constipation.    . bisacodyl (DULCOLAX) 5 MG EC tablet Take 1 tablet (5 mg total) by mouth daily as needed for moderate constipation. 30 tablet 0  . cholecalciferol (VITAMIN D) 1000 UNITS tablet Take 1,000 Units by mouth daily.     . DESCOVY 200-25 MG tablet TAKE 1 TABLET BY MOUTH AT BEDTIME. 30 tablet 6  . enalapril (VASOTEC) 20 MG tablet Take 20 mg by mouth daily.  0  . feeding supplement, ENSURE COMPLETE, (ENSURE COMPLETE) LIQD Take 237 mLs by mouth 2 (two) times daily between meals. 474 mL 11  . fentaNYL (DURAGESIC - DOSED MCG/HR) 50 MCG/HR Place 50 mcg onto the skin every 3 (three) days.    . furosemide (LASIX) 20 MG tablet Take 20 mg by mouth daily.  0  . HYDROcodone-acetaminophen (NORCO/VICODIN) 5-325 MG tablet Take 1-2 tablets by mouth every 6 (six) hours as needed for moderate  pain. (Patient not taking: Reported on 02/07/2016) 30 tablet 0  . hydrocortisone (PROCTOCORT) 1 % CREA Apply 1 Act topically as needed. (Patient not taking: Reported on 02/07/2016) 1 Tube 0  . HYDROmorphone (DILAUDID) 2 MG tablet Take 1 tablet (2 mg total) by mouth every 6 (six) hours as needed for severe pain. (Patient taking differently: Take 2 mg by mouth every 4 (four) hours as  needed for severe pain. ) 30 tablet 0  . lidocaine (XYLOCAINE) 2 % solution Caregiver: Mix 1part 2% viscous lidocaine, 1part H20. Patient: Swallow 55mL of this mixture, 34min before meals and at bedtime, up to QID, PRN sore throat. 100 mL 5  . magnesium hydroxide (MILK OF MAGNESIA) 400 MG/5ML suspension Take 30 mLs by mouth daily as needed for mild constipation.    . polyethylene glycol (MIRALAX / GLYCOLAX) packet Take 17 g by mouth daily. 14 each 0  . potassium chloride (K-DUR) 10 MEQ tablet Take 1 tablet (10 mEq total) by mouth daily. 90 tablet 2  . sennosides-docusate sodium (SENOKOT-S) 8.6-50 MG tablet Take 1 tablet by mouth daily.    . tamsulosin (FLOMAX) 0.4 MG CAPS capsule Take 1 capsule (0.4 mg total) by mouth 2 (two) times daily. 60 capsule 5  . TIVICAY 50 MG tablet TAKE 1 TABLET (50 MG TOTAL) BY MOUTH DAILY. 30 tablet 6  . vitamin A 10000 UNIT capsule Take 10,000 Units by mouth every evening.    . vitamin C (ASCORBIC ACID) 500 MG tablet Take 500 mg by mouth every evening.     No current facility-administered medications for this visit.      Allergies:  Allergies  Allergen Reactions  . Oxycodone Other (See Comments)    loopiness    Past Medical History, Surgical history, Social history, and Family History were reviewed and updated.  Physical Exam: Blood pressure 109/74, pulse 92, temperature 99.9 F (37.7 C), temperature source Oral, resp. rate 17, SpO2 98 %. ECOG: 2 General appearance: alert and cooperative appeared without distress. Head: Normocephalic, without obvious abnormality Neck: no adenopathy Lymph nodes: Cervical, supraclavicular, and axillary nodes normal. Heart:regular rate and rhythm, S1, S2 normal, no murmur, click, rub or gallop Lung:chest clear, no wheezing, rales, normal symmetric air entry Abdomin: soft, non-tender, without masses or organomegaly EXT:no erythema, induration, or nodules. Mild edema noted. 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  Lab Results: Lab Results  Component Value Date   WBC 12.0 (H) 02/06/2016   HGB 10.9 (L) 02/06/2016   HCT 33.6 (L) 02/06/2016   MCV 80.2 02/06/2016   PLT 292 02/06/2016     Chemistry      Component Value Date/Time   NA 131 (L) 02/06/2016 0404   K 4.5 02/06/2016 0404   CL 109 02/06/2016 0404   CO2 18 (L) 02/06/2016 0404   BUN 31 (H) 02/06/2016 0404   CREATININE 0.83 02/06/2016 0404   CREATININE 1.24 (H) 06/14/2015 1019      Component Value Date/Time   CALCIUM 8.3 (L) 02/06/2016 0404   ALKPHOS 1,745 (H) 02/02/2016 0356   AST 46 (H) 02/02/2016 0356   ALT 16 (L) 02/02/2016 0356   BILITOT 0.3 02/02/2016 0356       Impression and Plan:  75 year old gentleman with the following issues:  1. Advanced prostate cancer that is currently hormone sensitive. He was initially diagnosed in 2013 with a Gleason score 4+3 = 7 and a PSA. 5.6. He was treated with definitive therapy utilizing IMRT radiation therapy concluded in September 2013.  He developed a advanced metastatic disease with bony metastasis confirmed by bone scan in November 2016 and his most recent PSA in January 2017 was 167 with testosterone of 22. He received Lupron 30 mg at that time.   His disease has progressed based on imaging studies in September 2017 including cervical, lumbar and thoracic spine. We have discussed the risks and benefits of adding Zytiga to his Lupron as well as prednisone. Complications include nausea, fatigue, hypertension among others were reviewed and he is agreeable to proceed. The benefit will be controlling his disease and improvement in his bone pain. We will make sure there is no drug interaction associated with his other medication.  Complications associated with prednisone including lower extremity edema, hyperglycemia and insomnia.  2. Pain control: He is currently on fentanyl patch managed by his primary care physician. This can be titrated a higher doses if needed.   3.  Thoracic cord compression: Status post radiation therapy with few fractions left at this time. No neurological recovery noted at this time.  4. Follow-up: Will be in 4 weeks to assess his response to therapy.   Zola Button, MD 9/13/20172:36 PM

## 2016-02-15 ENCOUNTER — Telehealth: Payer: Self-pay

## 2016-02-15 ENCOUNTER — Ambulatory Visit
Admission: RE | Admit: 2016-02-15 | Discharge: 2016-02-15 | Disposition: A | Discharge status: Home / Self Care | Payer: Commercial Managed Care - HMO | Source: Ambulatory Visit | Attending: Radiation Oncology | Admitting: Radiation Oncology

## 2016-02-15 DIAGNOSIS — M549 Dorsalgia, unspecified: Secondary | ICD-10-CM | POA: Diagnosis not present

## 2016-02-15 DIAGNOSIS — G893 Neoplasm related pain (acute) (chronic): Secondary | ICD-10-CM | POA: Diagnosis not present

## 2016-02-15 DIAGNOSIS — Z923 Personal history of irradiation: Secondary | ICD-10-CM | POA: Diagnosis not present

## 2016-02-15 DIAGNOSIS — C7951 Secondary malignant neoplasm of bone: Secondary | ICD-10-CM | POA: Diagnosis not present

## 2016-02-15 NOTE — Telephone Encounter (Signed)
Per Dr. Pearlie Oyster request I called [facility] to inquire if Billy Lopez was receiving decadron while in their facility. I spoke with Freda Munro and she reported that he was not receiving decadron, it was not on his medicine list.

## 2016-02-16 ENCOUNTER — Ambulatory Visit
Admission: RE | Admit: 2016-02-16 | Discharge: 2016-02-16 | Disposition: A | Discharge status: Home / Self Care | Payer: Commercial Managed Care - HMO | Source: Ambulatory Visit | Attending: Radiation Oncology | Admitting: Radiation Oncology

## 2016-02-16 DIAGNOSIS — Z923 Personal history of irradiation: Secondary | ICD-10-CM | POA: Diagnosis not present

## 2016-02-16 DIAGNOSIS — M549 Dorsalgia, unspecified: Secondary | ICD-10-CM | POA: Diagnosis not present

## 2016-02-16 DIAGNOSIS — C7951 Secondary malignant neoplasm of bone: Secondary | ICD-10-CM | POA: Diagnosis not present

## 2016-02-16 DIAGNOSIS — G893 Neoplasm related pain (acute) (chronic): Secondary | ICD-10-CM | POA: Diagnosis not present

## 2016-02-19 ENCOUNTER — Ambulatory Visit (HOSPITAL_COMMUNITY)
Admission: RE | Admit: 2016-02-19 | Discharge: 2016-02-19 | Disposition: A | Discharge status: Home / Self Care | Payer: Commercial Managed Care - HMO | Source: Ambulatory Visit | Attending: Radiation Oncology | Admitting: Radiation Oncology

## 2016-02-19 ENCOUNTER — Ambulatory Visit
Admission: RE | Admit: 2016-02-19 | Discharge: 2016-02-19 | Disposition: A | Discharge status: Home / Self Care | Payer: Commercial Managed Care - HMO | Source: Ambulatory Visit | Attending: Radiation Oncology | Admitting: Radiation Oncology

## 2016-02-19 ENCOUNTER — Encounter: Payer: Self-pay | Admitting: Radiation Oncology

## 2016-02-19 VITALS — BP 119/72 | HR 93 | Temp 99.2°F | Ht 71.0 in

## 2016-02-19 DIAGNOSIS — C7951 Secondary malignant neoplasm of bone: Secondary | ICD-10-CM

## 2016-02-19 DIAGNOSIS — G893 Neoplasm related pain (acute) (chronic): Secondary | ICD-10-CM | POA: Diagnosis not present

## 2016-02-19 DIAGNOSIS — Z923 Personal history of irradiation: Secondary | ICD-10-CM | POA: Diagnosis not present

## 2016-02-19 DIAGNOSIS — M549 Dorsalgia, unspecified: Secondary | ICD-10-CM | POA: Diagnosis not present

## 2016-02-19 NOTE — Progress Notes (Signed)
Billy Lopez is here for his last fraction of Radiation to his C 1-5 and C 6- T5 Spine. He denies pain at this time. He is using a Fentanyl patch and 4 mg of dilaudid every 4 hours for pain relief at [facility]. He is concerned about Right lower arm and Hand swelling that he noticed occurred yesterday, he did notify a nurse at his nursing facility, but tells me it has increased since that time. He does report bowel movements about every 3 days, and plans to start miralax in the next couple of days. He report difficulty swallowing because of pain in his throat. He is using lidocaine before eating and tells me it helps "a little bit". He also has thick mucous which causes difficulty with swallowing.   BP 119/72   Pulse 93   Temp 99.2 F (37.3 C)   Ht 5\' 11"  (1.803 m)   SpO2 96% Comment: room air   Wt Readings from Last 3 Encounters:  02/02/16 166 lb 7.2 oz (75.5 kg)  01/21/16 144 lb 10 oz (65.6 kg)  01/16/16 168 lb (76.2 kg)

## 2016-02-19 NOTE — Progress Notes (Signed)
   Weekly Management Note:  outpatient    ICD-9-CM ICD-10-CM   1. Bone metastases (HCC) 198.5 C79.51 VAS [place] UPPER EXTREMITY VENOUS DUPLEX    Current Dose: 30 Gy to Upper C spine /20 Gy  To lower C spine and upper T spine Projected Dose: 30 Gy to Upper C spine  / 20 Gy To lower C spine and upper T spine   Narrative:  The patient presents for routine under treatment assessment.  CBCT/MVCT images/Port film x-rays were reviewed.  The chart was checked.   Pain in back  is better, but new right hand and wrist pain/ swelling. No trauma there.  Esophageal soreness helped by lidocaine   Physical Findings:  Wt Readings from Last 3 Encounters:  02/02/16 166 lb 7.2 oz (75.5 kg)  01/21/16 144 lb 10 oz (65.6 kg)  01/16/16 168 lb (76.2 kg)    height is 5\' 11"  (1.803 m). His temperature is 99.2 F (37.3 C). His blood pressure is 119/72 and his pulse is 93. His oxygen saturation is 96%.   In wheelchair, NAD Swelling, tender, in hand/wrist on right. No skin irritation or rash. No oral thrush No skin irritation over upper back   CBC    Component Value Date/Time   WBC 12.0 (H) 02/06/2016 0404   RBC 4.19 (L) 02/06/2016 0404   HGB 10.9 (L) 02/06/2016 0404   HCT 33.6 (L) 02/06/2016 0404   PLT 292 02/06/2016 0404   MCV 80.2 02/06/2016 0404   MCH 26.0 02/06/2016 0404   MCHC 32.4 02/06/2016 0404   RDW 16.0 (H) 02/06/2016 0404   LYMPHSABS 1.3 02/02/2016 0356   MONOABS 0.9 02/02/2016 0356   EOSABS 0.1 02/02/2016 0356   BASOSABS 0.0 02/02/2016 0356     CMP     Component Value Date/Time   NA 131 (L) 02/06/2016 0404   K 4.5 02/06/2016 0404   CL 109 02/06/2016 0404   CO2 18 (L) 02/06/2016 0404   GLUCOSE 123 (H) 02/06/2016 0404   BUN 31 (H) 02/06/2016 0404   CREATININE 0.83 02/06/2016 0404   CREATININE 1.24 (H) 06/14/2015 1019   CALCIUM 8.3 (L) 02/06/2016 0404   PROT 5.8 (L) 02/02/2016 0356   ALBUMIN 2.9 (L) 02/02/2016 0356   AST 46 (H) 02/02/2016 0356   ALT 16 (L) 02/02/2016 0356   ALKPHOS 1,745 (H) 02/02/2016 0356   BILITOT 0.3 02/02/2016 0356   GFRNONAA >60 02/06/2016 0404   GFRNONAA 58 (L) 06/14/2015 1019   GFRAA >60 02/06/2016 0404   GFRAA 67 06/14/2015 1019     Impression:  The patient is tolerating radiotherapy.   Plan:  Continue radiotherapy as planned.  Not on decadron. Will get [place] of RUE to rule out DVT (negative prelim report) F/u 48mo.  -----------------------------------  Eppie Gibson, MD

## 2016-02-19 NOTE — Progress Notes (Signed)
VASCULAR LAB PRELIMINARY  PRELIMINARY  PRELIMINARY  PRELIMINARY  Right upper extremity venous duplex completed.    Preliminary report:  Right :  No evidence of DVT or superficial thrombosis.    Billy Lopez, [name], RVS 02/19/2016, 4:22 PM

## 2016-02-20 DIAGNOSIS — G8929 Other chronic pain: Secondary | ICD-10-CM | POA: Diagnosis not present

## 2016-02-20 DIAGNOSIS — R5381 Other malaise: Secondary | ICD-10-CM | POA: Diagnosis not present

## 2016-02-20 DIAGNOSIS — R29898 Other symptoms and signs involving the musculoskeletal system: Secondary | ICD-10-CM | POA: Diagnosis not present

## 2016-02-20 DIAGNOSIS — R2689 Other abnormalities of gait and mobility: Secondary | ICD-10-CM | POA: Diagnosis not present

## 2016-02-22 ENCOUNTER — Encounter: Payer: Self-pay | Admitting: Pharmacist

## 2016-02-22 NOTE — Progress Notes (Signed)
Oral Chemotherapy Pharmacist Encounter   New prescription for Zytiga obtained on 9/15 from Dr. Alen Blew. This medication was ready for patient to pick up on 02/16/16 with copay $0. Per Bethena Roys at [facility], the prescription was mailed to the patient.  LVM for patient today to ensure patient has obtained medication and to offer initial counseling on administration ad side effects.  Phone number for oral chemo clinic left on patient's voicemail to call with any questions or concerns.  Oral Chemo Clinic will continue to follow.  Johny Drilling, PharmD, BCPS 02/22/2016  4:19 PM Oral Chemotherapy Clinic [phone]

## 2016-02-26 DIAGNOSIS — R29898 Other symptoms and signs involving the musculoskeletal system: Secondary | ICD-10-CM | POA: Diagnosis not present

## 2016-02-26 DIAGNOSIS — G8929 Other chronic pain: Secondary | ICD-10-CM | POA: Diagnosis not present

## 2016-02-26 DIAGNOSIS — R2689 Other abnormalities of gait and mobility: Secondary | ICD-10-CM | POA: Diagnosis not present

## 2016-02-26 DIAGNOSIS — R5381 Other malaise: Secondary | ICD-10-CM | POA: Diagnosis not present

## 2016-02-27 NOTE — Progress Notes (Signed)
  Radiation Oncology         [phone]) [id number] ________________________________  Name: Billy Lopez. MRN: [id number]  Date: 02/19/2016  DOB: Dec 23, 1940  End of Treatment Note  DIAGNOSIS:  C79.51 bone metastases  Indication for treatment:  palliative     Radiation treatment dates:   02/06/2016-02/19/2016  Site/dose:   1) C1-C5 / 30 Gy in 10 fractions ; 2) C6-T5 / 20Gy in 5 fractions  Beams/energy:   1) 3D / 6X ; 2) 3D / 6 and 10X  Narrative: The patient tolerated radiation treatment relatively well.     Plan: The patient has completed radiation treatment. The patient will return to radiation oncology clinic for routine followup in one month. I advised them to call or return sooner if they have any questions or concerns related to their recovery or treatment.  -----------------------------------  Eppie Gibson, MD

## 2016-02-28 DIAGNOSIS — K5900 Constipation, unspecified: Secondary | ICD-10-CM | POA: Diagnosis not present

## 2016-02-29 NOTE — Addendum Note (Signed)
Encounter addended by: Ernst Spell, RN on: 02/29/2016 12:31 PM<BR>    Actions taken: Charge Capture section accepted

## 2016-03-05 ENCOUNTER — Encounter: Payer: Self-pay | Admitting: Radiation Oncology

## 2016-03-05 MED FILL — ZYTIGA 250 MG TABLET: 250 | 30 days supply | Qty: 120 | Fill #0

## 2016-03-06 DIAGNOSIS — J449 Chronic obstructive pulmonary disease, unspecified: Secondary | ICD-10-CM | POA: Diagnosis not present

## 2016-03-06 DIAGNOSIS — M6281 Muscle weakness (generalized): Secondary | ICD-10-CM | POA: Diagnosis not present

## 2016-03-06 DIAGNOSIS — G8929 Other chronic pain: Secondary | ICD-10-CM | POA: Diagnosis not present

## 2016-03-06 DIAGNOSIS — Z23 Encounter for immunization: Secondary | ICD-10-CM | POA: Diagnosis not present

## 2016-03-06 DIAGNOSIS — B20 Human immunodeficiency virus [HIV] disease: Secondary | ICD-10-CM | POA: Diagnosis not present

## 2016-03-06 DIAGNOSIS — C7951 Secondary malignant neoplasm of bone: Secondary | ICD-10-CM | POA: Diagnosis not present

## 2016-03-06 DIAGNOSIS — R1312 Dysphagia, oropharyngeal phase: Secondary | ICD-10-CM | POA: Diagnosis not present

## 2016-03-06 DIAGNOSIS — C61 Malignant neoplasm of prostate: Secondary | ICD-10-CM | POA: Diagnosis not present

## 2016-03-06 DIAGNOSIS — E440 Moderate protein-calorie malnutrition: Secondary | ICD-10-CM | POA: Diagnosis not present

## 2016-03-06 DIAGNOSIS — R41841 Cognitive communication deficit: Secondary | ICD-10-CM | POA: Diagnosis not present

## 2016-03-06 DIAGNOSIS — J189 Pneumonia, unspecified organism: Secondary | ICD-10-CM | POA: Diagnosis not present

## 2016-03-08 ENCOUNTER — Telehealth: Payer: Self-pay | Admitting: Oncology

## 2016-03-08 NOTE — Telephone Encounter (Signed)
LEFT MESSAGE RE 10/17 LAB/FU

## 2016-03-11 MED FILL — TIVICAY 50 MG TABLET: 50 | 30 days supply | Qty: 30 | Fill #3

## 2016-03-11 MED FILL — DESCOVY 200-25 MG TABS: 200-25 | 30 days supply | Qty: 30 | Fill #3

## 2016-03-12 DIAGNOSIS — C7951 Secondary malignant neoplasm of bone: Secondary | ICD-10-CM | POA: Diagnosis not present

## 2016-03-12 DIAGNOSIS — R509 Fever, unspecified: Secondary | ICD-10-CM | POA: Diagnosis not present

## 2016-03-12 DIAGNOSIS — C61 Malignant neoplasm of prostate: Secondary | ICD-10-CM | POA: Diagnosis not present

## 2016-03-12 DIAGNOSIS — G893 Neoplasm related pain (acute) (chronic): Secondary | ICD-10-CM | POA: Diagnosis not present

## 2016-03-13 ENCOUNTER — Other Ambulatory Visit: Payer: Self-pay | Admitting: Oncology

## 2016-03-13 DIAGNOSIS — Z5181 Encounter for therapeutic drug level monitoring: Secondary | ICD-10-CM | POA: Diagnosis not present

## 2016-03-14 ENCOUNTER — Telehealth: Payer: Self-pay | Admitting: *Deleted

## 2016-03-14 DIAGNOSIS — C7951 Secondary malignant neoplasm of bone: Secondary | ICD-10-CM | POA: Diagnosis not present

## 2016-03-14 DIAGNOSIS — G893 Neoplasm related pain (acute) (chronic): Secondary | ICD-10-CM | POA: Diagnosis not present

## 2016-03-14 DIAGNOSIS — C61 Malignant neoplasm of prostate: Secondary | ICD-10-CM | POA: Diagnosis not present

## 2016-03-14 NOTE — Telephone Encounter (Signed)
Per dr Alen Blew and pharmacy. Will hold zytiga until 03/28/16. Summer glover DON at [facility] care notified. She will contact brian at [email or web address] week of the 23rd to request refill on zytiga

## 2016-03-18 ENCOUNTER — Telehealth: Payer: Self-pay | Admitting: Internal Medicine

## 2016-03-18 NOTE — Telephone Encounter (Signed)
Patient states someone called him but not sure who.  Patient is requesting follow up call.

## 2016-03-19 ENCOUNTER — Ambulatory Visit: Payer: Commercial Managed Care - HMO | Admitting: Oncology

## 2016-03-19 ENCOUNTER — Other Ambulatory Visit: Payer: Commercial Managed Care - HMO

## 2016-03-21 DIAGNOSIS — L89520 Pressure ulcer of left ankle, unstageable: Secondary | ICD-10-CM | POA: Diagnosis not present

## 2016-03-21 DIAGNOSIS — L89310 Pressure ulcer of right buttock, unstageable: Secondary | ICD-10-CM | POA: Diagnosis not present

## 2016-03-26 DIAGNOSIS — M5415 Radiculopathy, thoracolumbar region: Secondary | ICD-10-CM | POA: Diagnosis not present

## 2016-03-27 ENCOUNTER — Ambulatory Visit
Admission: RE | Admit: 2016-03-27 | Discharge: 2016-03-27 | Disposition: A | Discharge status: Home / Self Care | Payer: Commercial Managed Care - HMO | Source: Ambulatory Visit | Attending: Radiation Oncology | Admitting: Radiation Oncology

## 2016-03-27 ENCOUNTER — Encounter: Payer: Self-pay | Admitting: Radiation Oncology

## 2016-03-27 DIAGNOSIS — Z993 Dependence on wheelchair: Secondary | ICD-10-CM | POA: Insufficient documentation

## 2016-03-27 DIAGNOSIS — R32 Unspecified urinary incontinence: Secondary | ICD-10-CM | POA: Insufficient documentation

## 2016-03-27 DIAGNOSIS — Z79899 Other long term (current) drug therapy: Secondary | ICD-10-CM | POA: Diagnosis not present

## 2016-03-27 DIAGNOSIS — C7951 Secondary malignant neoplasm of bone: Secondary | ICD-10-CM | POA: Insufficient documentation

## 2016-03-27 NOTE — Progress Notes (Signed)
Radiation Oncology         [phone]) [phone] ________________________________  Name: Billy Lopez. MRN: [id number]  Date: 03/27/2016  DOB: 1940/08/29  Follow-Up Visit Note  Outpatient  CC: [name], MD  Biagio Borg, MD  Diagnosis and Prior Radiotherapy:    ICD-9-CM ICD-10-CM   1. Bone metastases (HCC) 198.5 C79.51     C79.51 bone metastases  PRIOR RT completed 02-19-16:  1) C1-C5 / 30 Gy in 10 fx 2) C6-T5 / 20 Gy in 5 fx  Narrative:  The patient returns today for routine follow-up. He reports pain in his majority of his back and left side a 8/10 today. He is using a 50 mg Fentanyl patch and Dilaudid every 4 hours in his nursing facility. He reports spasms in his legs which make his legs jump uncontrollably at times. He notes this feels like a stabbing pain. He has a wound on his right outer ankle with a dressing intact. There is blood observed under the dressing. The patient does not know how he received the wound when asked. He is non-ambulatory and uses a motorized wheelchair for mobility. He is incontinent of urine and bowel. He reports eating well and gaining weight. He is concerned about the progression of his cancer since completing treatment. The patient was a no-show to his appointment with Dr. Alen Blew of medical oncology on October 17th. He has not seen Dr. Alen Blew since completing radiotherapy in mid September. It appears, according to his medical chart, Fabio Asa will be held until tomorrow. I do not see when his next follow-up with Dr. Alen Blew will occur. Patient reports he can not feel below the umbilicus through the feet.     Still no motor function in His legs.  Denies neck pain.                    ALLERGIES:  is allergic to oxycodone.  Meds: Current Outpatient Prescriptions  Medication Sig Dispense Refill  . acetaminophen (TYLENOL) 325 MG tablet Take 2 tablets (650 mg total) by mouth every 6 (six) hours as needed for mild pain (or Fever >/= 101). 30 tablet 0  . bisacodyl  (DULCOLAX) 10 MG suppository Place 10 mg rectally daily as needed for mild constipation or moderate constipation.    . bisacodyl (DULCOLAX) 5 MG EC tablet Take 1 tablet (5 mg total) by mouth daily as needed for moderate constipation. 30 tablet 0  . cholecalciferol (VITAMIN D) 1000 UNITS tablet Take 1,000 Units by mouth daily.     . DESCOVY 200-25 MG tablet TAKE 1 TABLET BY MOUTH AT BEDTIME. 30 tablet 6  . enalapril (VASOTEC) 20 MG tablet Take 20 mg by mouth daily.  0  . feeding supplement, ENSURE COMPLETE, (ENSURE COMPLETE) LIQD Take 237 mLs by mouth 2 (two) times daily between meals. 474 mL 11  . fentaNYL (DURAGESIC - DOSED MCG/HR) 50 MCG/HR Place 50 mcg onto the skin every 3 (three) days.    . furosemide (LASIX) 20 MG tablet Take 20 mg by mouth daily.  0  . HYDROcodone-acetaminophen (NORCO/VICODIN) 5-325 MG tablet Take 1-2 tablets by mouth every 6 (six) hours as needed for moderate pain. 30 tablet 0  . hydrocortisone (PROCTOCORT) 1 % CREA Apply 1 Act topically as needed. 1 Tube 0  . HYDROmorphone (DILAUDID) 2 MG tablet Take 1 tablet (2 mg total) by mouth every 6 (six) hours as needed for severe pain. (Patient taking differently: Take 2 mg by mouth every 4 (four)  hours as needed for severe pain. He report he is taking dilaudid 4 mg every 4 hours at [facility].) 30 tablet 0  . magnesium hydroxide (MILK OF MAGNESIA) 400 MG/5ML suspension Take 30 mLs by mouth daily as needed for mild constipation.    . polyethylene glycol (MIRALAX / GLYCOLAX) packet Take 17 g by mouth daily. 14 each 0  . potassium chloride (K-DUR) 10 MEQ tablet Take 1 tablet (10 mEq total) by mouth daily. 90 tablet 2  . sennosides-docusate sodium (SENOKOT-S) 8.6-50 MG tablet Take 1 tablet by mouth daily.    . tamsulosin (FLOMAX) 0.4 MG CAPS capsule Take 1 capsule (0.4 mg total) by mouth 2 (two) times daily. 60 capsule 5  . TIVICAY 50 MG tablet TAKE 1 TABLET (50 MG TOTAL) BY MOUTH DAILY. 30 tablet 6  . vitamin A 10000 UNIT  capsule Take 10,000 Units by mouth every evening.    . vitamin C (ASCORBIC ACID) 500 MG tablet Take 500 mg by mouth every evening.    Marland Kitchen ZYTIGA 250 MG tablet TAKE 4 TABLETS BY MOUTH DAILY. TAKE ON AN EMPTY STOMACH 1 HOUR BEFORE OR 2 HOURS AFTER A MEAL. 120 tablet 0  . lidocaine (XYLOCAINE) 2 % solution Caregiver: Mix 1part 2% viscous lidocaine, 1part H20. Patient: Swallow 50mL of this mixture, 71min before meals and at bedtime, up to QID, PRN sore throat. (Patient not taking: Reported on 03/27/2016) 100 mL 5   No current facility-administered medications for this encounter.     Physical Findings: The patient is in no acute distress. Patient is alert and oriented.  height is 5\' 11"  (1.803 m). His temperature is 98.2 F (36.8 C). His blood pressure is 113/92 (abnormal) and his pulse is 111 (abnormal). His oxygen saturation is 92%. .   Acute distress when experiencing leg spasms Pitting edema extending up to knees bilaterally. Minimal sensation in legs     No motor function in legs  Lab Findings: Lab Results  Component Value Date   WBC 12.0 (H) 02/06/2016   HGB 10.9 (L) 02/06/2016   HCT 33.6 (L) 02/06/2016   MCV 80.2 02/06/2016   PLT 292 02/06/2016    Radiographic Findings: No results found.  Impression/Plan: Today we had a lengthy discussion regardless his goals and his emotional state. He is a remarkable man.  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  He lacks clarity in terms of plans for systemic therapy.  I think that seeing Dr. Alen Blew, and a palliative care specialist, would be helpful.  Hospice is a legitimate option warranting consideration.  Arrange for an appointment with Dr. Alen Blew - pt needs transportation from [facility]. I will send a note to Dr Alen Blew and our palliative care specialist Wadie Lessen to see if appts can be same day.  I  do not recommend more RT at this time. I will see him PRN.  Eppie Gibson, MD This document serves as a record of services personally performed by Eppie Gibson, MD. It was created on her behalf by Bethann Humble, a trained medical scribe. The creation of this record is based on the scribe's personal observations and the provider's statements to them. This document has been checked and approved by the attending provider.

## 2016-03-27 NOTE — Progress Notes (Signed)
Billy Lopez is here for follow up of radiation to his C1-C5, and C6-T5 completed 02/19/16. He reports pain in his back and left side a 8/10 today. He is using a 50 mcgFentanyl patch and dilaudid every 4 hours in his nursing facility. He reports spasms in his legs which make his legs jump uncontrollably at times. He has a wound on his right outer ankle with a dressing intact. There is blood observed under the dressing. The patient does not know how he received this wound when asked. He is non-ambulatory and uses a motorized wheelchair for mobility. He is incontinent of urine and bowel. He reports eating well, and is gaining weight. He is concerned about the progression of his cancer since completing treatment.   BP (!) 113/92   Pulse (!) 111   Temp 98.2 F (36.8 C)   Ht 5\' 11"  (1.803 m)   SpO2 92% Comment: room air   Wt Readings from Last 3 Encounters:  02/02/16 166 lb 7.2 oz (75.5 kg)  01/21/16 144 lb 10 oz (65.6 kg)  01/16/16 168 lb (76.2 kg)

## 2016-03-28 ENCOUNTER — Encounter: Payer: Self-pay | Admitting: Internal Medicine

## 2016-03-28 DIAGNOSIS — L89313 Pressure ulcer of right buttock, stage 3: Secondary | ICD-10-CM | POA: Diagnosis not present

## 2016-03-28 DIAGNOSIS — L89520 Pressure ulcer of left ankle, unstageable: Secondary | ICD-10-CM | POA: Diagnosis not present

## 2016-03-28 MED FILL — ZYTIGA 250 MG TABLET: 250 | 30 days supply | Qty: 120 | Fill #0

## 2016-03-29 ENCOUNTER — Telehealth: Payer: Self-pay | Admitting: Radiation Therapy

## 2016-03-29 DIAGNOSIS — R799 Abnormal finding of blood chemistry, unspecified: Secondary | ICD-10-CM | POA: Diagnosis not present

## 2016-03-29 DIAGNOSIS — Z5181 Encounter for therapeutic drug level monitoring: Secondary | ICD-10-CM | POA: Diagnosis not present

## 2016-03-29 NOTE — Telephone Encounter (Signed)
Billy Lopez where pt is residing to make them aware of a visit he has here at the cancer center on Monday 10/30. They will be able to provide the necessary transportation for this visit.     To see Dr. Alen Blew at 9:30 and Palliative Care NP, Wadie Lessen at 10:00.      Mont Dutton R.T.(R)(T) Special Procedures Navigator

## 2016-04-01 ENCOUNTER — Ambulatory Visit (HOSPITAL_BASED_OUTPATIENT_CLINIC_OR_DEPARTMENT_OTHER): Payer: Commercial Managed Care - HMO | Admitting: Oncology

## 2016-04-01 ENCOUNTER — Other Ambulatory Visit (HOSPITAL_BASED_OUTPATIENT_CLINIC_OR_DEPARTMENT_OTHER): Payer: Commercial Managed Care - HMO

## 2016-04-01 ENCOUNTER — Ambulatory Visit
Admission: RE | Admit: 2016-04-01 | Discharge: 2016-04-01 | Disposition: A | Discharge status: Home / Self Care | Payer: Commercial Managed Care - HMO | Source: Ambulatory Visit | Attending: Oncology | Admitting: Oncology

## 2016-04-01 VITALS — BP 116/73 | HR 102 | Temp 98.5°F | Resp 18 | Ht 71.0 in

## 2016-04-01 DIAGNOSIS — C7951 Secondary malignant neoplasm of bone: Secondary | ICD-10-CM

## 2016-04-01 DIAGNOSIS — Z7189 Other specified counseling: Secondary | ICD-10-CM | POA: Diagnosis not present

## 2016-04-01 DIAGNOSIS — G893 Neoplasm related pain (acute) (chronic): Secondary | ICD-10-CM

## 2016-04-01 DIAGNOSIS — C61 Malignant neoplasm of prostate: Secondary | ICD-10-CM

## 2016-04-01 DIAGNOSIS — Z515 Encounter for palliative care: Secondary | ICD-10-CM

## 2016-04-01 LAB — CBC WITH DIFFERENTIAL/PLATELET
BASO%: 0.5 % (ref 0.0–2.0)
Basophils Absolute: 0 10*3/uL (ref 0.0–0.1)
EOS%: 1.4 % (ref 0.0–7.0)
Eosinophils Absolute: 0.1 10*3/uL (ref 0.0–0.5)
HCT: 27.8 % — ABNORMAL LOW (ref 38.4–49.9)
HEMOGLOBIN: 8.8 g/dL — AB (ref 13.0–17.1)
LYMPH%: 16 % (ref 14.0–49.0)
MCH: 26.3 pg — ABNORMAL LOW (ref 27.2–33.4)
MCHC: 31.7 g/dL — ABNORMAL LOW (ref 32.0–36.0)
MCV: 83 fL (ref 79.3–98.0)
MONO#: 0.8 10*3/uL (ref 0.1–0.9)
MONO%: 12.5 % (ref 0.0–14.0)
NEUT%: 69.6 % (ref 39.0–75.0)
NEUTROS ABS: 4.4 10*3/uL (ref 1.5–6.5)
Platelets: 197 10*3/uL (ref 140–400)
RBC: 3.35 10*6/uL — AB (ref 4.20–5.82)
RDW: 16.8 % — AB (ref 11.0–14.6)
WBC: 6.3 10*3/uL (ref 4.0–10.3)
lymph#: 1 10*3/uL (ref 0.9–3.3)

## 2016-04-01 LAB — COMPREHENSIVE METABOLIC PANEL
ALBUMIN: 2.2 g/dL — AB (ref 3.5–5.0)
ALT: 17 U/L (ref 0–55)
AST: 34 U/L (ref 5–34)
Anion Gap: 8 mEq/L (ref 3–11)
BILIRUBIN TOTAL: 0.55 mg/dL (ref 0.20–1.20)
BUN: 9.2 mg/dL (ref 7.0–26.0)
CO2: 22 meq/L (ref 22–29)
Calcium: 8.3 mg/dL — ABNORMAL LOW (ref 8.4–10.4)
Chloride: 109 mEq/L (ref 98–109)
Creatinine: 0.7 mg/dL (ref 0.7–1.3)
EGFR: >90 mL/min/{1.73_m2} (ref >90)
GLUCOSE: 102 mg/dL (ref 70–140)
POTASSIUM: 3.7 meq/L (ref 3.5–5.1)
SODIUM: 140 meq/L (ref 136–145)
TOTAL PROTEIN: 5.8 g/dL — AB (ref 6.4–8.3)

## 2016-04-01 NOTE — Progress Notes (Signed)
Hematology and Oncology Follow Up Visit  Billy Lopez [id number] 23-Apr-1941 75 y.o. 04/01/2016 10:54 AM [name], MDJohn, Billy Oris, MD   Principle Diagnosis: 75 year old gentleman with prostate cancer. He was initially diagnosed in 2003 with a Gleason score 4+3 = 7 and a PSA 5.6. 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   Prior Therapy:  He received definitive radiation therapy with IMRT concluded in September 2013. He developed metastatic bony disease in November 2016 with a PSA of 256. 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  Current therapy:  Lupron androgen deprivation given at [facility]. He is receiving palliative radiation therapy to the thoracic spine. Zytiga 1000 mg daily started in September 2017.  Interim History: Billy Lopez presents today for a follow-up visit. Since the last visit, he currently resides in a skilled nursing facility. He was started on Zytiga although he was given an appropriate doses and the skilled nursing facility and he was receiving 1000 mg 4 times a day and took that for a few days. When these findings were discovered, medication was discontinued and laboratory data did not show any hepatotoxicity. Clinically he does not feel any different since starting this medication. Continues to have chronic pain issues and spasms in his lower extremities.   His quality of life remains very poor and he is continued to be incontinent of urine and feces. His biggest issue continues to be chronic pain and seems to be inadequately controlled. He denied any nausea or vomiting. He denied any recent hospitalizations.   He does not report any headaches, blurry vision, syncope or seizures. He does not report any fevers, chills, sweats or weight loss. He does not report any chest pain, palpitation or orthopnea. Does not report any cough, wheezing or hemoptysis. Does not report any nausea, vomiting, abdominal  pain. Does not report any hematochezia or melena. Does not report any frequency urgency or hesitancy. Remaining review of systems unremarkable.   Medications: I have reviewed the patient's current medications.  Current Outpatient Prescriptions  Medication Sig Dispense Refill  . acetaminophen (TYLENOL) 325 MG tablet Take 2 tablets (650 mg total) by mouth every 6 (six) hours as needed for mild pain (or Fever >/= 101). 30 tablet 0  . bisacodyl (DULCOLAX) 10 MG suppository Place 10 mg rectally daily as needed for mild constipation or moderate constipation.    . bisacodyl (DULCOLAX) 5 MG EC tablet Take 1 tablet (5 mg total) by mouth daily as needed for moderate constipation. 30 tablet 0  . cholecalciferol (VITAMIN D) 1000 UNITS tablet Take 1,000 Units by mouth daily.     . DESCOVY 200-25 MG tablet TAKE 1 TABLET BY MOUTH AT BEDTIME. 30 tablet 6  . enalapril (VASOTEC) 20 MG tablet Take 20 mg by mouth daily.  0  . feeding supplement, ENSURE COMPLETE, (ENSURE COMPLETE) LIQD Take 237 mLs by mouth 2 (two) times daily between meals. 474 mL 11  . fentaNYL (DURAGESIC - DOSED MCG/HR) 75 MCG/HR Place 75 mcg onto the skin every 3 (three) days.    . furosemide (LASIX) 20 MG tablet Take 20 mg by mouth daily.  0  . HYDROcodone-acetaminophen (NORCO/VICODIN) 5-325 MG tablet Take 1-2 tablets by mouth every 6 (six) hours as needed for moderate pain. 30 tablet 0  . hydrocortisone (PROCTOCORT) 1 % CREA Apply 1 Act topically as needed. 1 Tube 0  . HYDROmorphone (DILAUDID) 2 MG tablet Take 1 tablet (2 mg total) by mouth every  6 (six) hours as needed for severe pain. (Patient taking differently: Take 2 mg by mouth every 4 (four) hours as needed for severe pain. He report he is taking dilaudid 4 mg every 4 hours at [facility].) 30 tablet 0  . lidocaine (XYLOCAINE) 2 % solution Caregiver: Mix 1part 2% viscous lidocaine, 1part H20. Patient: Swallow 17mL of this mixture, 57min before meals and at bedtime, up to QID, PRN  sore throat. 100 mL 5  . magnesium hydroxide (MILK OF MAGNESIA) 400 MG/5ML suspension Take 30 mLs by mouth daily as needed for mild constipation.    . polyethylene glycol (MIRALAX / GLYCOLAX) packet Take 17 g by mouth daily. 14 each 0  . potassium chloride (K-DUR) 10 MEQ tablet Take 1 tablet (10 mEq total) by mouth daily. 90 tablet 2  . sennosides-docusate sodium (SENOKOT-S) 8.6-50 MG tablet Take 1 tablet by mouth daily.    . tamsulosin (FLOMAX) 0.4 MG CAPS capsule Take 1 capsule (0.4 mg total) by mouth 2 (two) times daily. 60 capsule 5  . TIVICAY 50 MG tablet TAKE 1 TABLET (50 MG TOTAL) BY MOUTH DAILY. 30 tablet 6  . vitamin A 10000 UNIT capsule Take 10,000 Units by mouth every evening.    . vitamin C (ASCORBIC ACID) 500 MG tablet Take 500 mg by mouth every evening.    Marland Kitchen ZYTIGA 250 MG tablet TAKE 4 TABLETS BY MOUTH DAILY. TAKE ON AN EMPTY STOMACH 1 HOUR BEFORE OR 2 HOURS AFTER A MEAL. 120 tablet 0   No current facility-administered medications for this visit.      Allergies:  Allergies  Allergen Reactions  . Oxycodone Other (See Comments)    loopiness    Past Medical History, Surgical history, Social history, and Family History were reviewed and updated.  Physical Exam: Blood pressure 116/73, pulse (!) 102, temperature 98.5 F (36.9 C), temperature source Oral, resp. rate 18, height 5\' 11"  (1.803 m), SpO2 94 %. ECOG: 2 General appearance: Chronically ill-appearing gentleman without distress. Head: Normocephalic, without obvious abnormality oral ulcers or lesions. Neck: no adenopathy Lymph nodes: Cervical, supraclavicular, and axillary nodes normal. Heart:regular rate and rhythm, S1, S2 normal, no murmur, click, rub or gallop Lung:chest clear, no wheezing, rales, normal symmetric air entry Abdomin: soft, non-tender, without masses or organomegaly EXT:no erythema, induration, or nodules. Edema bilaterally noted. 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  Lab  Results: Lab Results  Component Value Date   WBC 6.3 04/01/2016   HGB 8.8 (L) 04/01/2016   HCT 27.8 (L) 04/01/2016   MCV 83.0 04/01/2016   PLT 197 04/01/2016     Chemistry      Component Value Date/Time   NA 131 (L) 02/06/2016 0404   K 4.5 02/06/2016 0404   CL 109 02/06/2016 0404   CO2 18 (L) 02/06/2016 0404   BUN 31 (H) 02/06/2016 0404   CREATININE 0.83 02/06/2016 0404   CREATININE 1.24 (H) 06/14/2015 1019      Component Value Date/Time   CALCIUM 8.3 (L) 02/06/2016 0404   ALKPHOS 1,745 (H) 02/02/2016 0356   AST 46 (H) 02/02/2016 0356   ALT 16 (L) 02/02/2016 0356   BILITOT 0.3 02/02/2016 0356       Impression and Plan:  75 year old gentleman with the following issues:  1. Advanced prostate cancer that is currently hormone sensitive. He was initially diagnosed in 2013 with a Gleason score 4+3 = 7 and a PSA. 5.6. He was treated with definitive therapy utilizing IMRT radiation therapy concluded  in September 2013. He developed a advanced metastatic disease with bony metastasis confirmed by bone scan in November 2016 and his most recent PSA in January 2017 was 167 with testosterone of 22. He received Lupron 30 mg at that time.   He developed castration resistant disease and currently on Zytiga. This medication has been discontinued for the last 2 weeks and his laboratory data were repeated today. Options of therapy were discussed today including continuing Zytiga versus discontinuation. He understands the goal of therapy is palliative although it is unclear to me how much palliation Zytiga well and has offered him.  The plan is to repeat his laboratory testing today and check his liver function tests and electrolytes. He is willing to resume Zytiga if his laboratory testing is within normal range. He is willing to try this for another month and we'll assess whether he has any clinical meaningful palliation to his cancer. If none detected, hospice care will be recommended at that  time.  2. Pain control: He is currently on fentanyl patch managed by his primary care physician. He is interested in been on methadone and has been on it in the past. I will defer that his primary care provider.  3. Thoracic cord compression: Status post radiation therapy with few fractions left at this time. No neurological recovery noted at this time.  4. Follow-up: Will be in 4 weeks to assess his response to therapy.   Billy Callaway, MD 10/30/201710:54 AM

## 2016-04-01 NOTE — Consult Note (Signed)
Consultation Note Date: 04/01/2016   Patient Name: Billy Lopez.  DOB: 1940-07-18  MRN: [id number]  Age / Sex: 75 y.o., male  PCP: Biagio Borg, MD Referring Physician: Wyatt Portela, MD  Reason for Consultation: Establishing goals of care, Non pain symptom management, Pain control and Psychosocial/spiritual support  HPI/Patient Profile: 75 y.o. male  with past medical history of prostate cancer, now widely metastatic to spine, s/p radiation under the direction of Dr Isidore Moos.  His oncologist os Dr Eldridge Scot and currently is receivng Zytiga  He currently lives in a SNF/[facility] and has had continued physical and functional decline 2/2 to disease.  He is wheelchair bound and paraplegic 2/2 to progression of disease, T2 cord compression.  He reports poorly managed pain and spasms,  and in reviewing his mediations list he is on a plethora of opioids.   He has had three hospitalizations in the last 6 months   Self reported continued phsycial and functional decline.  Clinical Assessment and Goals of Care:  This NP Wadie Lessen reviewed medical records, received report from team, assessed the patient and then meet with the patient in the OP radiation-oncology clinic to discuss the role of palliative care into a holistic treatment plan and to raise awareness to the importance of AD and advanced care planning  A  discussion was had today regarding advanced directives.  Concepts specific to code status was had.  The difference between a aggressive medical intervention path  and a palliative comfort care path for this patient at this time was had.  Values and goals of care important to patient were attempted to be elicited.  We discussed hospice benefit and its role in enhancing comfort and quality in patient with serious life limiting illness.   Billy Lopez was somewhat guarded today, I encouraged him to  call with questions or concerns and discussed setting up OP PCS         SUMMARY OF RECOMMENDATIONS    Code Status/Advance Care Planning: Full code   Symptom Management:    Patient has complicated  pain management  issues.  In order to minimize duplication of services and ensure quality and continuity of care  I am going to make a referral to the OP Palliative program through [facility] in the SNFs.    It has been suggested to Billy Lopez that Methadone conversation may be a more efficacious method of pain management and I agree.  Consider muscle relaxant   Palliative Prophylaxis:   Bowel Regimen   Psycho-social/Spiritual:    Additional Recommendations: Education on Hospice  Prognosis:   < 6 months, I believe he is hospice eligible but his continued use of Zytiga hinders access to.  I will discuss with Dr Alen Blew the value of Zytiga in prolonging quality of life for this patient at this time in this situation.   Discharge Planning: [facility] for rehab with Palliative care service follow-up      Primary Diagnoses: Present on Admission: **None**   I have  reviewed the medical record, interviewed the patient and family, and examined the patient. The following aspects are pertinent.  Past Medical History:  Diagnosis Date  . Allergic rhinitis, cause unspecified 07/21/2011  . Cervical spondylosis 07/21/2011  . CKD (chronic kidney disease) stage 3, GFR 30-59 ml/min 06/28/2015  . COPD (chronic obstructive pulmonary disease) ([place]) 07/21/2011  . GERD (gastroesophageal reflux disease)   . H/O blood clots   . 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 Hepatitis    Hep C  . 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 HIV infection ([place])   . Hx of radiation therapy 12/17/11 - 02/13/12   prostate  . Hypertension   . Lumbar spondylosis 07/21/2011  . Osteoarthritis 07/21/2011  . Pneumonia   . Prostate cancer ([place]) 09/19/11   gleason 7  . Prostate cancer metastatic to bone ([place]) 08/30/2015    . Rectal bleeding   . S/P radiation therapy 12/17/2011 through 02/13/2012   78 gray was delivered to the prostate in 40 fractions of 1.95 gray  . Septic joint of left shoulder region ([place]) 06/15/2015  . Substance abuse   . Wears dentures    top  . Wears glasses    Social History   Social History  . Marital status: Divorced    Spouse name: N/A  . Number of children: 3  . Years of education: 61   Occupational History  . UNEMPLOYED Unemployed  . Retired     Social worker   Social History Main Topics  . Smoking status: Former Smoker    Packs/day: 1.00    Years: 10.00    Types: Cigarettes    Quit date: 10/13/1960  . Smokeless tobacco: Never Used  . Alcohol use No  . Drug use: No     Comment: pt. states stopped using drugs 10/12  . Sexual activity: Not on file     Comment: pt. given condoms   Other Topics Concern  . Not on file   Social History Narrative   Separated x 7 years   3 daughters    Culture:mother is [place] and father hispanic   Speaks [place], [place], [place] and [place].         Family History  Problem Relation Age of Onset  . Cancer Mother     unsure of type  . Alcohol abuse Other   . Arthritis Other   . Hypertension Other   . Colon cancer Neg Hx   . Diabetes Neg Hx    Scheduled Meds: Continuous Infusions: PRN Meds:. Medications Prior to Admission:  Prior to Admission medications   Medication Sig Start Date End Date Taking? Authorizing Provider  acetaminophen (TYLENOL) 325 MG tablet Take 2 tablets (650 mg total) by mouth every 6 (six) hours as needed for mild pain (or Fever >/= 101). 10/13/14   Robbie Lis, MD  bisacodyl (DULCOLAX) 10 MG suppository Place 10 mg rectally daily as needed for mild constipation or moderate constipation.    Historical Provider, MD  bisacodyl (DULCOLAX) 5 MG EC tablet Take 1 tablet (5 mg total) by mouth daily as needed for moderate constipation. 10/13/14   Robbie Lis, MD  cholecalciferol (VITAMIN D) 1000 UNITS  tablet Take 1,000 Units by mouth daily.     Historical Provider, MD  DESCOVY 200-25 MG tablet TAKE 1 TABLET BY MOUTH AT BEDTIME. 12/13/15   Thayer Headings, MD  enalapril (VASOTEC) 20 MG tablet Take 20 mg by mouth daily. 11/06/15   Historical Provider, MD  feeding supplement,  ENSURE COMPLETE, (ENSURE COMPLETE) LIQD Take 237 mLs by mouth 2 (two) times daily between meals. 04/12/15   Biagio Borg, MD  fentaNYL (DURAGESIC - DOSED MCG/HR) 75 MCG/HR Place 75 mcg onto the skin every 3 (three) days. 03/28/16   Historical Provider, MD  furosemide (LASIX) 20 MG tablet Take 20 mg by mouth daily. 11/08/15   Historical Provider, MD  HYDROcodone-acetaminophen (NORCO/VICODIN) 5-325 MG tablet Take 1-2 tablets by mouth every 6 (six) hours as needed for moderate pain. 02/06/16   Lavina Hamman, MD  hydrocortisone (PROCTOCORT) 1 % CREA Apply 1 Act topically as needed. 02/06/16   Lavina Hamman, MD  HYDROmorphone (DILAUDID) 2 MG tablet Take 1 tablet (2 mg total) by mouth every 6 (six) hours as needed for severe pain. Patient taking differently: Take 2 mg by mouth every 4 (four) hours as needed for severe pain. He report he is taking dilaudid 4 mg every 4 hours at [facility]. 01/22/16   Velvet Bathe, MD  lidocaine (XYLOCAINE) 2 % solution Caregiver: Mix 1part 2% viscous lidocaine, 1part H20. Patient: Swallow 34mL of this mixture, 54min before meals and at bedtime, up to QID, PRN sore throat. 02/07/16   Eppie Gibson, MD  magnesium hydroxide (MILK OF MAGNESIA) 400 MG/5ML suspension Take 30 mLs by mouth daily as needed for mild constipation.    Historical Provider, MD  polyethylene glycol (MIRALAX / GLYCOLAX) packet Take 17 g by mouth daily. 02/06/16   Lavina Hamman, MD  potassium chloride (K-DUR) 10 MEQ tablet Take 1 tablet (10 mEq total) by mouth daily. 12/07/15   Biagio Borg, MD  sennosides-docusate sodium (SENOKOT-S) 8.6-50 MG tablet Take 1 tablet by mouth daily.    Historical Provider, MD  tamsulosin (FLOMAX) 0.4 MG CAPS  capsule Take 1 capsule (0.4 mg total) by mouth 2 (two) times daily. 12/07/15   Biagio Borg, MD  TIVICAY 50 MG tablet TAKE 1 TABLET (50 MG TOTAL) BY MOUTH DAILY. 12/13/15   Thayer Headings, MD  vitamin A 10000 UNIT capsule Take 10,000 Units by mouth every evening.    Historical Provider, MD  vitamin C (ASCORBIC ACID) 500 MG tablet Take 500 mg by mouth every evening.    Historical Provider, MD  ZYTIGA 250 MG tablet TAKE 4 TABLETS BY MOUTH DAILY. TAKE ON AN EMPTY STOMACH 1 HOUR BEFORE OR 2 HOURS AFTER A MEAL. 03/13/16   Wyatt Portela, MD   Allergies  Allergen Reactions  . Oxycodone Other (See Comments)    loopiness   Review of Systems  Constitutional: Positive for activity change and fatigue.  Cardiovascular: Positive for leg swelling.  Endocrine: Positive for cold intolerance.  Musculoskeletal: Positive for back pain.  Neurological: Positive for weakness.    Physical Exam  Constitutional: He appears cachectic. He appears ill.  HENT:  Mouth/Throat: Oropharynx is clear and moist. He has dentures.  Musculoskeletal:  - noted decrease sensation and strength BLE +3 edema  Neurological: He is alert.  Skin: Skin is warm and dry.    Vital Signs: There were no vitals taken for this visit.         SpO2:   O2 Device:  O2 Flow Rate: .   IO: Intake/output summary: No intake or output data in the 24 hours ending 04/01/16 1558  LBM:   Baseline Weight:   Most recent weight:       Palliative Assessment/Data: 30 % at best     Discussed with Juliette Mangle in OP  radiation- oncology and Dr Ned Grace  Time In: 1100 Time Out: 1215 Time Total: 75 min Greater than 50%  of this time was spent counseling and coordinating care related to the above assessment and plan.  Signed by: Wadie Lessen, NP   Please contact Palliative Medicine Team phone at [phone] for questions and concerns.  For individual provider: See Amion   04-02-16 1300   Addendum:  I spoke with Arnold Long NP with [facility]  OP Palliative program regarding Billy Lopez.   He is currently under OP PCS, she is  familiar with this patient and plans to re-evaluate and likely implement utilization of Methadone for pain control.  PCS services in the OP setting will be able to support him holistically, physically and emotionally.

## 2016-04-02 DIAGNOSIS — M62838 Other muscle spasm: Secondary | ICD-10-CM | POA: Diagnosis not present

## 2016-04-02 DIAGNOSIS — L89510 Pressure ulcer of right ankle, unstageable: Secondary | ICD-10-CM | POA: Diagnosis not present

## 2016-04-02 DIAGNOSIS — Z7189 Other specified counseling: Secondary | ICD-10-CM | POA: Insufficient documentation

## 2016-04-02 DIAGNOSIS — Z515 Encounter for palliative care: Secondary | ICD-10-CM | POA: Insufficient documentation

## 2016-04-02 LAB — PSA: Prostate Specific Ag, Serum: 55.2 ng/mL — ABNORMAL HIGH (ref 0.0–4.0)

## 2016-04-03 DIAGNOSIS — G619 Inflammatory polyneuropathy, unspecified: Secondary | ICD-10-CM | POA: Diagnosis not present

## 2016-04-03 DIAGNOSIS — I739 Peripheral vascular disease, unspecified: Secondary | ICD-10-CM | POA: Diagnosis not present

## 2016-04-05 DIAGNOSIS — G893 Neoplasm related pain (acute) (chronic): Secondary | ICD-10-CM | POA: Diagnosis not present

## 2016-04-05 DIAGNOSIS — C7951 Secondary malignant neoplasm of bone: Secondary | ICD-10-CM | POA: Diagnosis not present

## 2016-04-05 DIAGNOSIS — B20 Human immunodeficiency virus [HIV] disease: Secondary | ICD-10-CM | POA: Diagnosis not present

## 2016-04-05 DIAGNOSIS — C61 Malignant neoplasm of prostate: Secondary | ICD-10-CM | POA: Diagnosis not present

## 2016-04-08 DIAGNOSIS — R5383 Other fatigue: Secondary | ICD-10-CM | POA: Diagnosis not present

## 2016-04-08 MED FILL — DESCOVY 200-25 MG TABS: 200-25 | 30 days supply | Qty: 30 | Fill #4

## 2016-04-08 MED FILL — TIVICAY 50 MG TABLET: 50 | 30 days supply | Qty: 30 | Fill #4

## 2016-04-09 DIAGNOSIS — K5900 Constipation, unspecified: Secondary | ICD-10-CM | POA: Diagnosis not present

## 2016-04-11 DIAGNOSIS — L89510 Pressure ulcer of right ankle, unstageable: Secondary | ICD-10-CM | POA: Diagnosis not present

## 2016-04-11 DIAGNOSIS — L89890 Pressure ulcer of other site, unstageable: Secondary | ICD-10-CM | POA: Diagnosis not present

## 2016-04-11 DIAGNOSIS — L89313 Pressure ulcer of right buttock, stage 3: Secondary | ICD-10-CM | POA: Diagnosis not present

## 2016-04-11 DIAGNOSIS — C7951 Secondary malignant neoplasm of bone: Secondary | ICD-10-CM | POA: Diagnosis not present

## 2016-04-11 DIAGNOSIS — G893 Neoplasm related pain (acute) (chronic): Secondary | ICD-10-CM | POA: Diagnosis not present

## 2016-04-11 DIAGNOSIS — C61 Malignant neoplasm of prostate: Secondary | ICD-10-CM | POA: Diagnosis not present

## 2016-04-12 DIAGNOSIS — C61 Malignant neoplasm of prostate: Secondary | ICD-10-CM | POA: Diagnosis not present

## 2016-04-12 DIAGNOSIS — C7951 Secondary malignant neoplasm of bone: Secondary | ICD-10-CM | POA: Diagnosis not present

## 2016-04-12 DIAGNOSIS — G893 Neoplasm related pain (acute) (chronic): Secondary | ICD-10-CM | POA: Diagnosis not present

## 2016-04-16 ENCOUNTER — Telehealth: Payer: Self-pay | Admitting: General Practice

## 2016-04-16 DIAGNOSIS — C7951 Secondary malignant neoplasm of bone: Secondary | ICD-10-CM | POA: Diagnosis not present

## 2016-04-16 DIAGNOSIS — C61 Malignant neoplasm of prostate: Secondary | ICD-10-CM | POA: Diagnosis not present

## 2016-04-16 DIAGNOSIS — G893 Neoplasm related pain (acute) (chronic): Secondary | ICD-10-CM | POA: Diagnosis not present

## 2016-04-16 NOTE — Telephone Encounter (Signed)
Spoke with pt confirmed December appt. 

## 2016-04-17 DIAGNOSIS — R5383 Other fatigue: Secondary | ICD-10-CM | POA: Diagnosis not present

## 2016-04-17 DIAGNOSIS — G893 Neoplasm related pain (acute) (chronic): Secondary | ICD-10-CM | POA: Diagnosis not present

## 2016-04-17 DIAGNOSIS — C61 Malignant neoplasm of prostate: Secondary | ICD-10-CM | POA: Diagnosis not present

## 2016-04-17 DIAGNOSIS — B20 Human immunodeficiency virus [HIV] disease: Secondary | ICD-10-CM | POA: Diagnosis not present

## 2016-04-17 DIAGNOSIS — C7951 Secondary malignant neoplasm of bone: Secondary | ICD-10-CM | POA: Diagnosis not present

## 2016-04-18 DIAGNOSIS — G893 Neoplasm related pain (acute) (chronic): Secondary | ICD-10-CM | POA: Diagnosis not present

## 2016-04-18 DIAGNOSIS — L97119 Non-pressure chronic ulcer of right thigh with unspecified severity: Secondary | ICD-10-CM | POA: Diagnosis not present

## 2016-04-18 DIAGNOSIS — L89514 Pressure ulcer of right ankle, stage 4: Secondary | ICD-10-CM | POA: Diagnosis not present

## 2016-04-18 DIAGNOSIS — C7951 Secondary malignant neoplasm of bone: Secondary | ICD-10-CM | POA: Diagnosis not present

## 2016-04-18 DIAGNOSIS — C61 Malignant neoplasm of prostate: Secondary | ICD-10-CM | POA: Diagnosis not present

## 2016-04-18 DIAGNOSIS — L89894 Pressure ulcer of other site, stage 4: Secondary | ICD-10-CM | POA: Diagnosis not present

## 2016-04-19 DIAGNOSIS — M62838 Other muscle spasm: Secondary | ICD-10-CM | POA: Diagnosis not present

## 2016-04-19 DIAGNOSIS — C61 Malignant neoplasm of prostate: Secondary | ICD-10-CM | POA: Diagnosis not present

## 2016-04-19 DIAGNOSIS — G893 Neoplasm related pain (acute) (chronic): Secondary | ICD-10-CM | POA: Diagnosis not present

## 2016-04-19 DIAGNOSIS — G8220 Paraplegia, unspecified: Secondary | ICD-10-CM | POA: Diagnosis not present

## 2016-04-19 DIAGNOSIS — C7951 Secondary malignant neoplasm of bone: Secondary | ICD-10-CM | POA: Diagnosis not present

## 2016-04-22 DIAGNOSIS — C61 Malignant neoplasm of prostate: Secondary | ICD-10-CM | POA: Diagnosis not present

## 2016-04-22 DIAGNOSIS — G893 Neoplasm related pain (acute) (chronic): Secondary | ICD-10-CM | POA: Diagnosis not present

## 2016-04-22 DIAGNOSIS — C7951 Secondary malignant neoplasm of bone: Secondary | ICD-10-CM | POA: Diagnosis not present

## 2016-04-23 DIAGNOSIS — M5415 Radiculopathy, thoracolumbar region: Secondary | ICD-10-CM | POA: Diagnosis not present

## 2016-05-02 DIAGNOSIS — B078 Other viral warts: Secondary | ICD-10-CM | POA: Diagnosis not present

## 2016-05-02 DIAGNOSIS — L89313 Pressure ulcer of right buttock, stage 3: Secondary | ICD-10-CM | POA: Diagnosis not present

## 2016-05-02 DIAGNOSIS — L89514 Pressure ulcer of right ankle, stage 4: Secondary | ICD-10-CM | POA: Diagnosis not present

## 2016-05-02 DIAGNOSIS — L89894 Pressure ulcer of other site, stage 4: Secondary | ICD-10-CM | POA: Diagnosis not present

## 2016-05-02 MED FILL — TIVICAY 50 MG TABLET: 50 | 30 days supply | Qty: 30 | Fill #5

## 2016-05-02 MED FILL — DESCOVY 200-25 MG TABS: 200-25 | 30 days supply | Qty: 30 | Fill #5

## 2016-05-08 ENCOUNTER — Telehealth: Payer: Self-pay | Admitting: Oncology

## 2016-05-08 ENCOUNTER — Ambulatory Visit (HOSPITAL_BASED_OUTPATIENT_CLINIC_OR_DEPARTMENT_OTHER): Payer: Commercial Managed Care - HMO | Admitting: Oncology

## 2016-05-08 ENCOUNTER — Other Ambulatory Visit (HOSPITAL_BASED_OUTPATIENT_CLINIC_OR_DEPARTMENT_OTHER): Payer: Commercial Managed Care - HMO

## 2016-05-08 VITALS — BP 150/78 | HR 71 | Temp 98.1°F | Resp 16 | Ht 71.0 in

## 2016-05-08 DIAGNOSIS — C61 Malignant neoplasm of prostate: Secondary | ICD-10-CM | POA: Diagnosis not present

## 2016-05-08 DIAGNOSIS — C7951 Secondary malignant neoplasm of bone: Secondary | ICD-10-CM | POA: Diagnosis not present

## 2016-05-08 LAB — COMPREHENSIVE METABOLIC PANEL
ALBUMIN: 3.1 g/dL — AB (ref 3.5–5.0)
ALK PHOS: 723 U/L — AB (ref 40–150)
ALT: 18 U/L (ref 0–55)
ANION GAP: 8 meq/L (ref 3–11)
AST: 24 U/L (ref 5–34)
BILIRUBIN TOTAL: 0.39 mg/dL (ref 0.20–1.20)
BUN: 15.9 mg/dL (ref 7.0–26.0)
CALCIUM: 8.6 mg/dL (ref 8.4–10.4)
CO2: 21 mEq/L — ABNORMAL LOW (ref 22–29)
Chloride: 109 mEq/L (ref 98–109)
Creatinine: 0.7 mg/dL (ref 0.7–1.3)
Glucose: 89 mg/dl (ref 70–140)
POTASSIUM: 4.1 meq/L (ref 3.5–5.1)
SODIUM: 137 meq/L (ref 136–145)
Total Protein: 6.6 g/dL (ref 6.4–8.3)

## 2016-05-08 LAB — CBC WITH DIFFERENTIAL/PLATELET
BASO%: 1.1 % (ref 0.0–2.0)
BASOS ABS: 0.1 10*3/uL (ref 0.0–0.1)
EOS ABS: 0 10*3/uL (ref 0.0–0.5)
EOS%: 0.6 % (ref 0.0–7.0)
HEMATOCRIT: 31.2 % — AB (ref 38.4–49.9)
HEMOGLOBIN: 9.8 g/dL — AB (ref 13.0–17.1)
LYMPH%: 16 % (ref 14.0–49.0)
MCH: 25.8 pg — AB (ref 27.2–33.4)
MCHC: 31.3 g/dL — ABNORMAL LOW (ref 32.0–36.0)
MCV: 82.4 fL (ref 79.3–98.0)
MONO#: 1 10*3/uL — AB (ref 0.1–0.9)
MONO%: 12 % (ref 0.0–14.0)
NEUT#: 5.8 10*3/uL (ref 1.5–6.5)
NEUT%: 70.3 % (ref 39.0–75.0)
PLATELETS: 262 10*3/uL (ref 140–400)
RBC: 3.79 10*6/uL — ABNORMAL LOW (ref 4.20–5.82)
RDW: 19.3 % — AB (ref 11.0–14.6)
WBC: 8.3 10*3/uL (ref 4.0–10.3)
lymph#: 1.3 10*3/uL (ref 0.9–3.3)

## 2016-05-08 NOTE — Progress Notes (Signed)
Hematology and Oncology Follow Up Visit  Steadman Manglicmot [id number] 05/20/1941 75 y.o. 05/08/2016 9:09 AM [name], MDJohn, Hunt Oris, MD   Principle Diagnosis: 75 year old gentleman with prostate cancer. He was initially diagnosed in 2003 with a Gleason score 4+3 = 7 and a PSA 5.6. 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   Prior Therapy:  He received definitive radiation therapy with IMRT concluded in September 2013. He developed metastatic bony disease in November 2016 with a PSA of 256. 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  Current therapy:  Lupron androgen deprivation given at [facility]. He is receiving palliative radiation therapy to the thoracic spine. Zytiga 1000 mg daily started in September 2017.  Interim History: Mr. Wiggs presents today for a follow-up visit. Since the last visit, he reports no major changes in his overall health. He reports doing reasonably better since the last visit. He does report overall body spasms that have been problematic to him periodically. He remains on the fentanyl patch although he is receiving when necessary pain medication every 6 hours which is not holding him what 4 hours.   He continues to take Zytiga and have tolerated it well. He denied any nausea, vomiting or decline in his appetite. He does report increase in his appetite and weight. His quality of life presumably improved on this medication per his report. He does have issues with pressure ulcers then are currently being managed at the skilled nursing facility.  He is wheelchair bound at this time utilizing motorized scooter. He does not report any recent hospitalization or illnesses. His quality of life has not declined since the last visit may be slightly improved.   He does not report any headaches, blurry vision, syncope or seizures. He does not report any fevers, chills, sweats or weight loss. He does not report any  chest pain, palpitation or orthopnea. Does not report any cough, wheezing or hemoptysis. Does not report any nausea, vomiting, abdominal pain. Does not report any hematochezia or melena. Does not report any frequency urgency or hesitancy. Remaining review of systems unremarkable.   Medications: I have reviewed the patient's current medications.  Current Outpatient Prescriptions  Medication Sig Dispense Refill  . acetaminophen (TYLENOL) 325 MG tablet Take 2 tablets (650 mg total) by mouth every 6 (six) hours as needed for mild pain (or Fever >/= 101). 30 tablet 0  . bisacodyl (DULCOLAX) 10 MG suppository Place 10 mg rectally daily as needed for mild constipation or moderate constipation.    . bisacodyl (DULCOLAX) 5 MG EC tablet Take 1 tablet (5 mg total) by mouth daily as needed for moderate constipation. 30 tablet 0  . cholecalciferol (VITAMIN D) 1000 UNITS tablet Take 1,000 Units by mouth daily.     . DESCOVY 200-25 MG tablet TAKE 1 TABLET BY MOUTH AT BEDTIME. 30 tablet 6  . dexamethasone (DECADRON) 4 MG tablet     . enalapril (VASOTEC) 20 MG tablet Take 20 mg by mouth daily.  0  . feeding supplement, ENSURE COMPLETE, (ENSURE COMPLETE) LIQD Take 237 mLs by mouth 2 (two) times daily between meals. 474 mL 11  . fentaNYL (DURAGESIC - DOSED MCG/HR) 12 MCG/HR     . fentaNYL (DURAGESIC - DOSED MCG/HR) 75 MCG/HR Place 75 mcg onto the skin every 3 (three) days.    . furosemide (LASIX) 20 MG tablet Take 20 mg by mouth daily.  0  . HYDROcodone-acetaminophen (NORCO/VICODIN) 5-325 MG tablet Take 1-2  tablets by mouth every 6 (six) hours as needed for moderate pain. 30 tablet 0  . hydrocortisone (PROCTOCORT) 1 % CREA Apply 1 Act topically as needed. 1 Tube 0  . HYDROmorphone (DILAUDID) 2 MG tablet Take 1 tablet (2 mg total) by mouth every 6 (six) hours as needed for severe pain. 30 tablet 0  . lidocaine (XYLOCAINE) 2 % solution Caregiver: Mix 1part 2% viscous lidocaine, 1part H20. Patient: Swallow 67mL of this  mixture, 3min before meals and at bedtime, up to QID, PRN sore throat. 100 mL 5  . magnesium hydroxide (MILK OF MAGNESIA) 400 MG/5ML suspension Take 30 mLs by mouth daily as needed for mild constipation.    . polyethylene glycol (MIRALAX / GLYCOLAX) packet Take 17 g by mouth daily. 14 each 0  . potassium chloride (K-DUR) 10 MEQ tablet Take 1 tablet (10 mEq total) by mouth daily. 90 tablet 2  . sennosides-docusate sodium (SENOKOT-S) 8.6-50 MG tablet Take 1 tablet by mouth daily.    . tamsulosin (FLOMAX) 0.4 MG CAPS capsule Take 1 capsule (0.4 mg total) by mouth 2 (two) times daily. 60 capsule 5  . TIVICAY 50 MG tablet TAKE 1 TABLET (50 MG TOTAL) BY MOUTH DAILY. 30 tablet 6  . vitamin A 10000 UNIT capsule Take 10,000 Units by mouth every evening.    . vitamin C (ASCORBIC ACID) 500 MG tablet Take 500 mg by mouth every evening.    Marland Kitchen ZYTIGA 250 MG tablet TAKE 4 TABLETS BY MOUTH DAILY. TAKE ON AN EMPTY STOMACH 1 HOUR BEFORE OR 2 HOURS AFTER A MEAL. 120 tablet 0   No current facility-administered medications for this visit.      Allergies:  Allergies  Allergen Reactions  . Oxycodone Other (See Comments)    loopiness    Past Medical History, Surgical history, Social history, and Family History were reviewed and updated.  Physical Exam: Blood pressure (!) 150/78, pulse 71, temperature 98.1 F (36.7 C), temperature source Oral, resp. rate 16, height 5\' 11"  (1.803 m), SpO2 100 %. ECOG: 2 General appearance: Alert, awake gentleman appeared comfortable today. Head: Normocephalic, without obvious abnormality no oral thrush. Neck: no adenopathy Lymph nodes: Cervical, supraclavicular, and axillary nodes normal. Heart:regular rate and rhythm, S1, S2 normal, no murmur, click, rub or gallop Lung:chest clear, no wheezing, rales, normal symmetric air entry Abdomin: soft, non-tender, without masses or organomegaly EXT:. Edema bilaterally noted. 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  Lab Results: Lab Results  Component Value Date   WBC 8.3 05/08/2016   HGB 9.8 (L) 05/08/2016   HCT 31.2 (L) 05/08/2016   MCV 82.4 05/08/2016   PLT 262 05/08/2016     Chemistry      Component Value Date/Time   NA 140 04/01/2016 1012   K 3.7 04/01/2016 1012   CL 109 02/06/2016 0404   CO2 22 04/01/2016 1012   BUN 9.2 04/01/2016 1012   CREATININE 0.7 04/01/2016 1012      Component Value Date/Time   CALCIUM 8.3 (L) 04/01/2016 1012   ALKPHOS 1,022 (H) 04/01/2016 1012   AST 34 04/01/2016 1012   ALT 17 04/01/2016 1012   BILITOT 0.55 04/01/2016 1012       Impression and Plan:  75 year old gentleman with the following issues:  1. Advanced prostate cancer that is currently hormone sensitive. He was initially diagnosed in 2013 with a Gleason score 4+3 = 7 and a PSA. 5.6. He was treated with definitive therapy utilizing IMRT radiation therapy concluded in  September 2013. He developed a advanced metastatic disease with bony metastasis confirmed by bone scan in November 2016 and his most recent PSA in January 2017 was 167 with testosterone of 22. He received Lupron 30 mg at that time.   He developed castration resistant disease and currently on Zytiga. He continues to tolerate this medication reasonably well without any complications. It appears that his overall clinical picture has improved on this medication. His alkaline phosphatase is down from 1700 to 1000. His last PSA is down to 55.2.  Risks and benefits of continuing this medication were reviewed today. I discussed with him the role of hospice as well if and when he comes off Zytiga. Overall, he feels that he is improved on this medication that was able to palliate his symptoms better. I do agree with continuing Zytiga for the time being but he understands that any benefit from this medication is temporary and will eventually progress. He understands this and would like to take this day by day on I think is a reasonable  approach.  I recommended continuing Zytiga for the time being and I will recheck his clinical status in about 5 weeks.  2. Pain control: He is currently on fentanyl patch with when necessary pain medication every 6 hours. I have recommended changing this to every 4 hours.  3. Thoracic cord compression: Status post radiation therapy with few fractions left at this time. No neurological recovery noted at this time.  4. Follow-up: Will be in 5 weeks to assess his response to therapy.   [facility], MD 12/6/20179:09 AM

## 2016-05-08 NOTE — Telephone Encounter (Signed)
Appointments scheduled per 12/6 LOS. Patient given AVS report and calendars with future scheduled appointments. °

## 2016-05-09 DIAGNOSIS — L89313 Pressure ulcer of right buttock, stage 3: Secondary | ICD-10-CM | POA: Diagnosis not present

## 2016-05-09 DIAGNOSIS — L89894 Pressure ulcer of other site, stage 4: Secondary | ICD-10-CM | POA: Diagnosis not present

## 2016-05-09 DIAGNOSIS — L89514 Pressure ulcer of right ankle, stage 4: Secondary | ICD-10-CM | POA: Diagnosis not present

## 2016-05-09 LAB — PSA: PROSTATE SPECIFIC AG, SERUM: 28.3 ng/mL — AB (ref 0.0–4.0)

## 2016-05-10 ENCOUNTER — Other Ambulatory Visit: Payer: Self-pay | Admitting: *Deleted

## 2016-05-10 MED ORDER — ABIRATERONE ACETATE 250 MG PO TABS: 1000.0000 mg | ORAL_TABLET | Freq: Every day | ORAL | 0 refills | Status: DC

## 2016-05-10 MED FILL — ZYTIGA 250 MG TABLET: 250 | 30 days supply | Qty: 120 | Fill #0

## 2016-05-16 DIAGNOSIS — L89313 Pressure ulcer of right buttock, stage 3: Secondary | ICD-10-CM | POA: Diagnosis not present

## 2016-05-16 DIAGNOSIS — L89514 Pressure ulcer of right ankle, stage 4: Secondary | ICD-10-CM | POA: Diagnosis not present

## 2016-05-16 DIAGNOSIS — L89894 Pressure ulcer of other site, stage 4: Secondary | ICD-10-CM | POA: Diagnosis not present

## 2016-05-16 DIAGNOSIS — M5415 Radiculopathy, thoracolumbar region: Secondary | ICD-10-CM | POA: Diagnosis not present

## 2016-05-20 ENCOUNTER — Encounter: Payer: Self-pay | Admitting: Internal Medicine

## 2016-05-20 ENCOUNTER — Ambulatory Visit (INDEPENDENT_AMBULATORY_CARE_PROVIDER_SITE_OTHER): Payer: Commercial Managed Care - HMO | Admitting: Internal Medicine

## 2016-05-20 VITALS — BP 131/98 | HR 99 | Temp 98.2°F

## 2016-05-20 DIAGNOSIS — B20 Human immunodeficiency virus [HIV] disease: Secondary | ICD-10-CM

## 2016-05-20 DIAGNOSIS — C61 Malignant neoplasm of prostate: Secondary | ICD-10-CM | POA: Diagnosis not present

## 2016-05-20 DIAGNOSIS — C7951 Secondary malignant neoplasm of bone: Secondary | ICD-10-CM | POA: Diagnosis not present

## 2016-05-20 DIAGNOSIS — G893 Neoplasm related pain (acute) (chronic): Secondary | ICD-10-CM | POA: Diagnosis not present

## 2016-05-20 DIAGNOSIS — M6281 Muscle weakness (generalized): Secondary | ICD-10-CM | POA: Diagnosis not present

## 2016-05-20 NOTE — Assessment & Plan Note (Signed)
He reports adequate pain control.

## 2016-05-20 NOTE — Progress Notes (Signed)
CC: Follow up for HIV  Interval history: Currently is asymptomatic and well-controlled on Tivicay and Descovy.  Since last visit he has had no missed doses and viral load remains undetectable, no labs prior to this visit though.  His last CD4 was 390.  He currently has metastatic prostate cancer and currently getting palliative radiation.      Prior to Admission medications   Medication Sig Start Date End Date Taking? Authorizing Provider  acetaminophen (TYLENOL) 325 MG tablet Take 2 tablets (650 mg total) by mouth every 6 (six) hours as needed for mild pain (or Fever >/= 101). 10/13/14  Yes Robbie Lis, MD  aspirin EC 325 MG EC tablet Take 1 tablet (325 mg total) by mouth 2 (two) times daily. 10/13/14  Yes Robbie Lis, MD  bisacodyl (DULCOLAX) 5 MG EC tablet Take 1 tablet (5 mg total) by mouth daily as needed for moderate constipation. 10/13/14  Yes Robbie Lis, MD  cholecalciferol (VITAMIN D) 1000 UNITS tablet Take 1,000 Units by mouth every evening.   Yes Historical Provider, MD  dolutegravir (TIVICAY) 50 MG tablet Take 1 tablet (50 mg total) by mouth daily. 05/22/15  Yes Thayer Headings, MD  emtricitabine-tenofovir AF (DESCOVY) 200-25 MG tablet Take 1 tablet by mouth at bedtime. 05/22/15  Yes Thayer Headings, MD  feeding supplement, ENSURE COMPLETE, (ENSURE COMPLETE) LIQD Take 237 mLs by mouth 2 (two) times daily between meals. 04/12/15  Yes Biagio Borg, MD  furosemide (LASIX) 20 MG tablet Take 1 tablet (20 mg total) by mouth daily. 11/09/14  Yes Biagio Borg, MD  HYDROcodone-acetaminophen [facility]) 10-325 MG tablet take 1/2 to 1 tablet by mouth every 8 hours if needed 06/28/15  Yes Biagio Borg, MD  potassium chloride (K-DUR) 10 MEQ tablet Take 1 tablet (10 mEq total) by mouth daily. 11/23/14  Yes Biagio Borg, MD  predniSONE (DELTASONE) 20 MG tablet Take 1 tablet (20 mg total) by mouth daily with breakfast. 06/28/15  Yes Truman Hayward, MD  tamsulosin [facility]) 0.4 MG CAPS take 1 capsule by  mouth twice a day 10/01/12  Yes Franchot Gallo, MD  vitamin A 10000 UNIT capsule Take 10,000 Units by mouth every evening.   Yes Historical Provider, MD  vitamin C (ASCORBIC ACID) 500 MG tablet Take 500 mg by mouth every evening.   Yes Historical Provider, MD    Review of Systems Constitutional: negative for fevers and chills; has chronic pain Gastrointestinal: negative for diarrhea and constipation Musculoskeletal: negative for myalgias and arthralgias All other systems reviewed and are negative   Physical Exam: CONSTITUTIONAL:in no apparent distress and alert ; in motorized chair Eyes: anicteric HENT: no thrush, no cervical lymphadenopathy Respiratory: Normal respiratory effort; CTA B Cardiovascular: RRR  Lab Results  Component Value Date   HIV1RNAQUANT <20 11/28/2015   HIV1RNAQUANT <20 06/14/2015   HIV1RNAQUANT <20 05/16/2015   No components found for: HIV1GENOTYPRPLUS No components found for: THELPERCELL

## 2016-05-20 NOTE — Assessment & Plan Note (Signed)
Labs today and can rtc 6 months

## 2016-05-21 DIAGNOSIS — C61 Malignant neoplasm of prostate: Secondary | ICD-10-CM | POA: Diagnosis not present

## 2016-05-21 DIAGNOSIS — M6281 Muscle weakness (generalized): Secondary | ICD-10-CM | POA: Diagnosis not present

## 2016-05-21 LAB — T-HELPER CELL (CD4) - (RCID CLINIC ONLY)
CD4 T CELL ABS: 510 /uL (ref 400–2700)
CD4 T CELL HELPER: 36 % (ref 33–55)

## 2016-05-22 DIAGNOSIS — M6281 Muscle weakness (generalized): Secondary | ICD-10-CM | POA: Diagnosis not present

## 2016-05-22 DIAGNOSIS — C61 Malignant neoplasm of prostate: Secondary | ICD-10-CM | POA: Diagnosis not present

## 2016-05-22 LAB — HIV-1 RNA QUANT-NO REFLEX-BLD: HIV-1 RNA Quant, Log: <1.3 Log copies/mL (ref <1.30)

## 2016-05-23 DIAGNOSIS — L89514 Pressure ulcer of right ankle, stage 4: Secondary | ICD-10-CM | POA: Diagnosis not present

## 2016-05-23 DIAGNOSIS — L89894 Pressure ulcer of other site, stage 4: Secondary | ICD-10-CM | POA: Diagnosis not present

## 2016-05-23 DIAGNOSIS — L97429 Non-pressure chronic ulcer of left heel and midfoot with unspecified severity: Secondary | ICD-10-CM | POA: Diagnosis not present

## 2016-05-23 DIAGNOSIS — C61 Malignant neoplasm of prostate: Secondary | ICD-10-CM | POA: Diagnosis not present

## 2016-05-23 DIAGNOSIS — M6281 Muscle weakness (generalized): Secondary | ICD-10-CM | POA: Diagnosis not present

## 2016-05-24 DIAGNOSIS — C61 Malignant neoplasm of prostate: Secondary | ICD-10-CM | POA: Diagnosis not present

## 2016-05-24 DIAGNOSIS — M6281 Muscle weakness (generalized): Secondary | ICD-10-CM | POA: Diagnosis not present

## 2016-05-26 DIAGNOSIS — C61 Malignant neoplasm of prostate: Secondary | ICD-10-CM | POA: Diagnosis not present

## 2016-05-26 DIAGNOSIS — M6281 Muscle weakness (generalized): Secondary | ICD-10-CM | POA: Diagnosis not present

## 2016-05-27 DIAGNOSIS — C61 Malignant neoplasm of prostate: Secondary | ICD-10-CM | POA: Diagnosis not present

## 2016-05-27 DIAGNOSIS — M6281 Muscle weakness (generalized): Secondary | ICD-10-CM | POA: Diagnosis not present

## 2016-05-28 DIAGNOSIS — C61 Malignant neoplasm of prostate: Secondary | ICD-10-CM | POA: Diagnosis not present

## 2016-05-28 DIAGNOSIS — M6281 Muscle weakness (generalized): Secondary | ICD-10-CM | POA: Diagnosis not present

## 2016-05-29 DIAGNOSIS — M6281 Muscle weakness (generalized): Secondary | ICD-10-CM | POA: Diagnosis not present

## 2016-05-29 DIAGNOSIS — C61 Malignant neoplasm of prostate: Secondary | ICD-10-CM | POA: Diagnosis not present

## 2016-05-30 DIAGNOSIS — M6281 Muscle weakness (generalized): Secondary | ICD-10-CM | POA: Diagnosis not present

## 2016-05-30 DIAGNOSIS — L89313 Pressure ulcer of right buttock, stage 3: Secondary | ICD-10-CM | POA: Diagnosis not present

## 2016-05-30 DIAGNOSIS — L97429 Non-pressure chronic ulcer of left heel and midfoot with unspecified severity: Secondary | ICD-10-CM | POA: Diagnosis not present

## 2016-05-30 DIAGNOSIS — D485 Neoplasm of uncertain behavior of skin: Secondary | ICD-10-CM | POA: Diagnosis not present

## 2016-05-30 DIAGNOSIS — L89514 Pressure ulcer of right ankle, stage 4: Secondary | ICD-10-CM | POA: Diagnosis not present

## 2016-05-30 DIAGNOSIS — C61 Malignant neoplasm of prostate: Secondary | ICD-10-CM | POA: Diagnosis not present

## 2016-05-31 MED FILL — DESCOVY 200-25 MG TABS: 200-25 | 30 days supply | Qty: 30 | Fill #6 | Status: TO

## 2016-05-31 MED FILL — TIVICAY 50 MG TABLET: 50 | 30 days supply | Qty: 30 | Fill #6 | Status: TO

## 2016-06-06 ENCOUNTER — Other Ambulatory Visit: Payer: Self-pay | Admitting: Oncology

## 2016-06-06 DIAGNOSIS — B20 Human immunodeficiency virus [HIV] disease: Secondary | ICD-10-CM | POA: Diagnosis not present

## 2016-06-06 DIAGNOSIS — L89514 Pressure ulcer of right ankle, stage 4: Secondary | ICD-10-CM | POA: Diagnosis not present

## 2016-06-06 DIAGNOSIS — L89313 Pressure ulcer of right buttock, stage 3: Secondary | ICD-10-CM | POA: Diagnosis not present

## 2016-06-06 DIAGNOSIS — G893 Neoplasm related pain (acute) (chronic): Secondary | ICD-10-CM | POA: Diagnosis not present

## 2016-06-06 DIAGNOSIS — L97429 Non-pressure chronic ulcer of left heel and midfoot with unspecified severity: Secondary | ICD-10-CM | POA: Diagnosis not present

## 2016-06-06 DIAGNOSIS — C61 Malignant neoplasm of prostate: Secondary | ICD-10-CM | POA: Diagnosis not present

## 2016-06-06 DIAGNOSIS — C7951 Secondary malignant neoplasm of bone: Secondary | ICD-10-CM | POA: Diagnosis not present

## 2016-06-06 MED FILL — ZYTIGA 250 MG TABLET: 250 | 30 days supply | Qty: 120 | Fill #0

## 2016-06-07 DIAGNOSIS — R5383 Other fatigue: Secondary | ICD-10-CM | POA: Diagnosis not present

## 2016-06-11 ENCOUNTER — Other Ambulatory Visit (HOSPITAL_BASED_OUTPATIENT_CLINIC_OR_DEPARTMENT_OTHER): Payer: Medicare HMO

## 2016-06-11 ENCOUNTER — Ambulatory Visit (HOSPITAL_BASED_OUTPATIENT_CLINIC_OR_DEPARTMENT_OTHER): Payer: Medicare HMO | Admitting: Oncology

## 2016-06-11 ENCOUNTER — Telehealth: Payer: Self-pay | Admitting: Oncology

## 2016-06-11 VITALS — BP 142/89 | HR 100 | Temp 98.6°F | Resp 20

## 2016-06-11 DIAGNOSIS — C7951 Secondary malignant neoplasm of bone: Secondary | ICD-10-CM

## 2016-06-11 DIAGNOSIS — C61 Malignant neoplasm of prostate: Secondary | ICD-10-CM | POA: Diagnosis not present

## 2016-06-11 LAB — CBC WITH DIFFERENTIAL/PLATELET
BASO%: 0.2 % (ref 0.0–2.0)
Basophils Absolute: 0 10*3/uL (ref 0.0–0.1)
EOS%: 0.2 % (ref 0.0–7.0)
Eosinophils Absolute: 0 10*3/uL (ref 0.0–0.5)
HCT: 34.4 % — ABNORMAL LOW (ref 38.4–49.9)
HEMOGLOBIN: 10.7 g/dL — AB (ref 13.0–17.1)
LYMPH#: 1.1 10*3/uL (ref 0.9–3.3)
LYMPH%: 9.5 % — ABNORMAL LOW (ref 14.0–49.0)
MCH: 25.9 pg — ABNORMAL LOW (ref 27.2–33.4)
MCHC: 31.1 g/dL — ABNORMAL LOW (ref 32.0–36.0)
MCV: 83.3 fL (ref 79.3–98.0)
MONO#: 1.4 10*3/uL — ABNORMAL HIGH (ref 0.1–0.9)
MONO%: 11.7 % (ref 0.0–14.0)
NEUT#: 9.4 10*3/uL — ABNORMAL HIGH (ref 1.5–6.5)
NEUT%: 78.4 % — ABNORMAL HIGH (ref 39.0–75.0)
Platelets: 313 10*3/uL (ref 140–400)
RBC: 4.13 10*6/uL — ABNORMAL LOW (ref 4.20–5.82)
RDW: 18.4 % — AB (ref 11.0–14.6)
WBC: 11.9 10*3/uL — AB (ref 4.0–10.3)

## 2016-06-11 LAB — COMPREHENSIVE METABOLIC PANEL
ALBUMIN: 3 g/dL — AB (ref 3.5–5.0)
ALT: 11 U/L (ref 0–55)
AST: 12 U/L (ref 5–34)
Alkaline Phosphatase: 543 U/L — ABNORMAL HIGH (ref 40–150)
Anion Gap: 11 mEq/L (ref 3–11)
BUN: 20.1 mg/dL (ref 7.0–26.0)
CALCIUM: 8.3 mg/dL — AB (ref 8.4–10.4)
CO2: 16 mEq/L — ABNORMAL LOW (ref 22–29)
CREATININE: 0.7 mg/dL (ref 0.7–1.3)
Chloride: 110 mEq/L — ABNORMAL HIGH (ref 98–109)
EGFR: >90 mL/min/{1.73_m2} (ref >90)
GLUCOSE: 162 mg/dL — AB (ref 70–140)
POTASSIUM: 4.2 meq/L (ref 3.5–5.1)
SODIUM: 137 meq/L (ref 136–145)
Total Bilirubin: 0.28 mg/dL (ref 0.20–1.20)
Total Protein: 6.5 g/dL (ref 6.4–8.3)

## 2016-06-11 LAB — TECHNOLOGIST REVIEW

## 2016-06-11 NOTE — Telephone Encounter (Signed)
Appointments scheduled per 1/9 LOS. Patient given AVS report and calendars with future scheduled appointments. °

## 2016-06-11 NOTE — Progress Notes (Signed)
Hematology and Oncology Follow Up Visit  Billy Lopez [id number] December 18, 1940 76 y.o. 06/11/2016 1:38 PM [name], MDJohn, Hunt Oris, MD   Principle Diagnosis: 76 year old gentleman with prostate cancer. He was initially diagnosed in 2003 with a Gleason score 4+3 = 7 and a PSA 5.6. 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   Prior Therapy:  He received definitive radiation therapy with IMRT concluded in September 2013. He developed metastatic bony disease in November 2016 with a PSA of 256. 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  Current therapy:  Lupron androgen deprivation given at [facility]. He is receiving palliative radiation therapy to the thoracic spine. Zytiga 1000 mg daily started in September 2017.  Interim History: Mr. [name] presents today for a follow-up visit. Since the last visit, he continues to note improvement in his overall health. His appetite continues to improve and his quality of life slowly improving on Zytiga. He continues to take Zytiga and have tolerated it well. He denied any nausea, vomiting or decline in his appetite. He denied any new arthralgias or myalgias. Although he does have chronic pain related to his paraplegia. He does report overall body spasms that have been problematic to him periodically. He remains on the fentanyl patch with breakthrough hydrocodone every 6 hours.  His quality of life is not dramatically changed although it has not declined. He believes he is thriving in his upper body strength continues to improve. He does report very little to no sensation in his lower extremities.   He does not report any headaches, blurry vision, syncope or seizures. He does not report any fevers, chills, sweats or weight loss. He does not report any chest pain, palpitation or orthopnea. Does not report any cough, wheezing or hemoptysis. Does not report any nausea, vomiting, abdominal pain. Does  not report any hematochezia or melena. Does not report any frequency urgency or hesitancy. Remaining review of systems unremarkable.   Medications: I have reviewed the patient's current medications.  Current Outpatient Prescriptions  Medication Sig Dispense Refill  . acetaminophen (TYLENOL) 325 MG tablet Take 2 tablets (650 mg total) by mouth every 6 (six) hours as needed for mild pain (or Fever >/= 101). 30 tablet 0  . bisacodyl (DULCOLAX) 10 MG suppository Place 10 mg rectally daily as needed for mild constipation or moderate constipation.    . bisacodyl (DULCOLAX) 5 MG EC tablet Take 1 tablet (5 mg total) by mouth daily as needed for moderate constipation. 30 tablet 0  . cholecalciferol (VITAMIN D) 1000 UNITS tablet Take 1,000 Units by mouth daily.     . DESCOVY 200-25 MG tablet TAKE 1 TABLET BY MOUTH AT BEDTIME. 30 tablet 6  . dexamethasone (DECADRON) 4 MG tablet     . enalapril (VASOTEC) 20 MG tablet Take 20 mg by mouth daily.  0  . feeding supplement, ENSURE COMPLETE, (ENSURE COMPLETE) LIQD Take 237 mLs by mouth 2 (two) times daily between meals. 474 mL 11  . fentaNYL (DURAGESIC - DOSED MCG/HR) 12 MCG/HR     . fentaNYL (DURAGESIC - DOSED MCG/HR) 75 MCG/HR Place 75 mcg onto the skin every 3 (three) days.    . furosemide (LASIX) 20 MG tablet Take 20 mg by mouth daily.  0  . HYDROcodone-acetaminophen (NORCO/VICODIN) 5-325 MG tablet Take 1-2 tablets by mouth every 6 (six) hours as needed for moderate pain. 30 tablet 0  . hydrocortisone (PROCTOCORT) 1 % CREA Apply 1 Act topically as  needed. 1 Tube 0  . HYDROmorphone (DILAUDID) 2 MG tablet Take 1 tablet (2 mg total) by mouth every 6 (six) hours as needed for severe pain. 30 tablet 0  . lidocaine (XYLOCAINE) 2 % solution Caregiver: Mix 1part 2% viscous lidocaine, 1part H20. Patient: Swallow 107mL of this mixture, 53min before meals and at bedtime, up to QID, PRN sore throat. 100 mL 5  . magnesium hydroxide (MILK OF MAGNESIA) 400 MG/5ML suspension  Take 30 mLs by mouth daily as needed for mild constipation.    . polyethylene glycol (MIRALAX / GLYCOLAX) packet Take 17 g by mouth daily. 14 each 0  . potassium chloride (K-DUR) 10 MEQ tablet Take 1 tablet (10 mEq total) by mouth daily. 90 tablet 2  . sennosides-docusate sodium (SENOKOT-S) 8.6-50 MG tablet Take 1 tablet by mouth daily.    . tamsulosin (FLOMAX) 0.4 MG CAPS capsule Take 1 capsule (0.4 mg total) by mouth 2 (two) times daily. 60 capsule 5  . TIVICAY 50 MG tablet TAKE 1 TABLET (50 MG TOTAL) BY MOUTH DAILY. 30 tablet 6  . vitamin A 10000 UNIT capsule Take 10,000 Units by mouth every evening.    . vitamin C (ASCORBIC ACID) 500 MG tablet Take 500 mg by mouth every evening.    Marland Kitchen ZYTIGA 250 MG tablet TAKE 4 TABLETS BY MOUTH DAILY. TAKE ON AN EMPTY STOMACH 1 HOUR BEFORE OR 2 HOURS AFTER A MEAL 120 tablet 0   No current facility-administered medications for this visit.      Allergies:  Allergies  Allergen Reactions  . Oxycodone Other (See Comments)    loopiness    Past Medical History, Surgical history, Social history, and Family History were reviewed and updated.  Physical Exam: Blood pressure (!) 142/89, pulse 100, temperature 98.6 F (37 C), temperature source Oral, resp. rate 20, SpO2 100 %. ECOG: 2 General appearance: Chronically ill-appearing gentleman appeared comfortable. Head: Normocephalic, without obvious abnormality no oral ulcers or thrush. Neck: no adenopathy Lymph nodes: Cervical, supraclavicular, and axillary nodes normal. Heart:regular rate and rhythm, S1, S2 normal, no murmur, click, rub or gallop Lung:chest clear, no wheezing, rales, normal symmetric air entry Abdomin: soft, non-tender, without masses or organomegaly no shifting dullness or ascites. EXT:. Edema bilaterally noted. 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  Lab Results: Lab Results  Component Value Date   WBC 11.9 (H) 06/11/2016   HGB 10.7 (L) 06/11/2016   HCT 34.4 (L)  06/11/2016   MCV 83.3 06/11/2016   PLT 313 06/11/2016     Chemistry      Component Value Date/Time   NA 137 05/08/2016 0832   K 4.1 05/08/2016 0832   CL 109 02/06/2016 0404   CO2 21 (L) 05/08/2016 0832   BUN 15.9 05/08/2016 0832   CREATININE 0.7 05/08/2016 0832      Component Value Date/Time   CALCIUM 8.6 05/08/2016 0832   ALKPHOS 723 (H) 05/08/2016 0832   AST 24 05/08/2016 0832   ALT 18 05/08/2016 0832   BILITOT 0.39 05/08/2016 0832       Impression and Plan:  76 year old gentleman with the following issues:  1. Advanced prostate cancer that is currently hormone sensitive. He was initially diagnosed in 2013 with a Gleason score 4+3 = 7 and a PSA. 5.6. He was treated with definitive therapy utilizing IMRT radiation therapy concluded in September 2013. He developed a advanced metastatic disease with bony metastasis confirmed by bone scan in November 2016 and his most recent PSA in January  2017 was 167 with testosterone of 22. He received Lupron 30 mg at that time.   He developed castration resistant disease and currently on Zytiga. He continues to tolerate this medication reasonably well without any complications.  His PSA continues to decline and response to this therapy with improvement in his overall quality of life. He feels that his overall health has improved on this medication.  Risks and benefits of continuing this medication were reviewed today. He is agreeable to continue given the improvement in his clinical status. He understands that his improvement is rather temporary and will eventually progress and might require hospice in the future.   2. Pain control: He is currently on fentanyl patch with when necessary pain medication every 6 hours. I have recommended changing this to every 4 hours.  3. Thoracic cord compression: Status post radiation therapy with few fractions left at this time. No neurological recovery noted at this time.  4. Follow-up: Will be in 6  weeks to assess his response to therapy.   Zola Button, MD 1/9/20181:38 PM

## 2016-06-12 LAB — PSA: Prostate Specific Ag, Serum: 27.6 ng/mL — ABNORMAL HIGH (ref 0.0–4.0)

## 2016-06-13 DIAGNOSIS — L89514 Pressure ulcer of right ankle, stage 4: Secondary | ICD-10-CM | POA: Diagnosis not present

## 2016-06-13 DIAGNOSIS — L89313 Pressure ulcer of right buttock, stage 3: Secondary | ICD-10-CM | POA: Diagnosis not present

## 2016-06-13 DIAGNOSIS — L97422 Non-pressure chronic ulcer of left heel and midfoot with fat layer exposed: Secondary | ICD-10-CM | POA: Diagnosis not present

## 2016-06-20 DIAGNOSIS — L97424 Non-pressure chronic ulcer of left heel and midfoot with necrosis of bone: Secondary | ICD-10-CM | POA: Diagnosis not present

## 2016-06-20 DIAGNOSIS — L89514 Pressure ulcer of right ankle, stage 4: Secondary | ICD-10-CM | POA: Diagnosis not present

## 2016-06-20 DIAGNOSIS — L89314 Pressure ulcer of right buttock, stage 4: Secondary | ICD-10-CM | POA: Diagnosis not present

## 2016-06-20 DIAGNOSIS — L89894 Pressure ulcer of other site, stage 4: Secondary | ICD-10-CM | POA: Diagnosis not present

## 2016-06-28 ENCOUNTER — Other Ambulatory Visit: Payer: Self-pay | Admitting: Oncology

## 2016-06-29 MED FILL — ZYTIGA 250 MG TABLET: 250 | 30 days supply | Qty: 120 | Fill #0

## 2016-07-04 ENCOUNTER — Other Ambulatory Visit: Payer: Self-pay | Admitting: Internal Medicine

## 2016-07-04 DIAGNOSIS — L89894 Pressure ulcer of other site, stage 4: Secondary | ICD-10-CM | POA: Diagnosis not present

## 2016-07-04 DIAGNOSIS — B20 Human immunodeficiency virus [HIV] disease: Secondary | ICD-10-CM

## 2016-07-04 DIAGNOSIS — L89314 Pressure ulcer of right buttock, stage 4: Secondary | ICD-10-CM | POA: Diagnosis not present

## 2016-07-04 DIAGNOSIS — L97429 Non-pressure chronic ulcer of left heel and midfoot with unspecified severity: Secondary | ICD-10-CM | POA: Diagnosis not present

## 2016-07-05 MED FILL — TIVICAY 50 MG TABLET: 50 | 30 days supply | Qty: 30 | Fill #0

## 2016-07-05 MED FILL — DESCOVY 200-25 MG TABS: 200-25 | 30 days supply | Qty: 30 | Fill #0

## 2016-07-09 DIAGNOSIS — M6240 Contracture of muscle, unspecified site: Secondary | ICD-10-CM | POA: Diagnosis not present

## 2016-07-11 DIAGNOSIS — L89514 Pressure ulcer of right ankle, stage 4: Secondary | ICD-10-CM | POA: Diagnosis not present

## 2016-07-11 DIAGNOSIS — L89314 Pressure ulcer of right buttock, stage 4: Secondary | ICD-10-CM | POA: Diagnosis not present

## 2016-07-15 DIAGNOSIS — H2513 Age-related nuclear cataract, bilateral: Secondary | ICD-10-CM | POA: Diagnosis not present

## 2016-07-15 DIAGNOSIS — H04123 Dry eye syndrome of bilateral lacrimal glands: Secondary | ICD-10-CM | POA: Diagnosis not present

## 2016-07-19 ENCOUNTER — Other Ambulatory Visit: Payer: Self-pay | Admitting: Oncology

## 2016-07-23 ENCOUNTER — Telehealth: Payer: Self-pay | Admitting: *Deleted

## 2016-07-23 MED FILL — ZYTIGA 250 MG TABLET: 250 | 30 days supply | Qty: 120 | Fill #0

## 2016-07-23 NOTE — Telephone Encounter (Signed)
Received a voice message from [facility] is out of Zytiga and needs it refilled today. Please ask for [place] or [place], his nurses. Call back number is [phone]." When looking at his medication list, prescription was filled on 07/19/16 at [facility]. I called pharmacy and they [place] called after two weeks (when rx was filled in January) and they were out of medicine. Pharmacy wanting to make sure medication was being administered correctly. Prescription was "run through" today and [employer] filled it. I called [employer] and left a message for patients nurse to call [place] back to make sure they are administering #4 tablets per day.

## 2016-07-25 DIAGNOSIS — L97422 Non-pressure chronic ulcer of left heel and midfoot with fat layer exposed: Secondary | ICD-10-CM | POA: Diagnosis not present

## 2016-07-25 DIAGNOSIS — L89514 Pressure ulcer of right ankle, stage 4: Secondary | ICD-10-CM | POA: Diagnosis not present

## 2016-07-25 DIAGNOSIS — L89314 Pressure ulcer of right buttock, stage 4: Secondary | ICD-10-CM | POA: Diagnosis not present

## 2016-07-26 ENCOUNTER — Ambulatory Visit: Payer: Medicare HMO | Admitting: Oncology

## 2016-07-26 ENCOUNTER — Other Ambulatory Visit: Payer: Medicare HMO

## 2016-07-30 ENCOUNTER — Telehealth: Payer: Self-pay | Admitting: Oncology

## 2016-07-30 NOTE — Telephone Encounter (Signed)
[place] called to r/s patients appt from 2/23. Gave them new appt time and date.

## 2016-08-01 DIAGNOSIS — L97422 Non-pressure chronic ulcer of left heel and midfoot with fat layer exposed: Secondary | ICD-10-CM | POA: Diagnosis not present

## 2016-08-01 DIAGNOSIS — L89514 Pressure ulcer of right ankle, stage 4: Secondary | ICD-10-CM | POA: Diagnosis not present

## 2016-08-01 DIAGNOSIS — L89314 Pressure ulcer of right buttock, stage 4: Secondary | ICD-10-CM | POA: Diagnosis not present

## 2016-08-02 ENCOUNTER — Ambulatory Visit: Payer: Medicaid Other | Admitting: Oncology

## 2016-08-02 ENCOUNTER — Other Ambulatory Visit: Payer: Medicaid Other

## 2016-08-07 MED FILL — TIVICAY 50 MG TABLET: 50 | 30 days supply | Qty: 30 | Fill #1

## 2016-08-07 MED FILL — DESCOVY 200-25 MG TABS: 200-25 | 30 days supply | Qty: 30 | Fill #1

## 2016-08-08 DIAGNOSIS — C7951 Secondary malignant neoplasm of bone: Secondary | ICD-10-CM | POA: Diagnosis not present

## 2016-08-08 DIAGNOSIS — C61 Malignant neoplasm of prostate: Secondary | ICD-10-CM | POA: Diagnosis not present

## 2016-08-08 DIAGNOSIS — J449 Chronic obstructive pulmonary disease, unspecified: Secondary | ICD-10-CM | POA: Diagnosis not present

## 2016-08-08 DIAGNOSIS — G893 Neoplasm related pain (acute) (chronic): Secondary | ICD-10-CM | POA: Diagnosis not present

## 2016-08-08 DIAGNOSIS — L89314 Pressure ulcer of right buttock, stage 4: Secondary | ICD-10-CM | POA: Diagnosis not present

## 2016-08-08 DIAGNOSIS — L89894 Pressure ulcer of other site, stage 4: Secondary | ICD-10-CM | POA: Diagnosis not present

## 2016-08-08 DIAGNOSIS — L89514 Pressure ulcer of right ankle, stage 4: Secondary | ICD-10-CM | POA: Diagnosis not present

## 2016-08-09 DIAGNOSIS — E119 Type 2 diabetes mellitus without complications: Secondary | ICD-10-CM | POA: Diagnosis not present

## 2016-08-09 DIAGNOSIS — R5383 Other fatigue: Secondary | ICD-10-CM | POA: Diagnosis not present

## 2016-08-09 DIAGNOSIS — E559 Vitamin D deficiency, unspecified: Secondary | ICD-10-CM | POA: Diagnosis not present

## 2016-08-09 DIAGNOSIS — E785 Hyperlipidemia, unspecified: Secondary | ICD-10-CM | POA: Diagnosis not present

## 2016-08-13 ENCOUNTER — Telehealth: Payer: Self-pay | Admitting: Pharmacy Technician

## 2016-08-13 DIAGNOSIS — R945 Abnormal results of liver function studies: Secondary | ICD-10-CM | POA: Diagnosis not present

## 2016-08-13 DIAGNOSIS — G893 Neoplasm related pain (acute) (chronic): Secondary | ICD-10-CM | POA: Diagnosis not present

## 2016-08-13 DIAGNOSIS — E559 Vitamin D deficiency, unspecified: Secondary | ICD-10-CM | POA: Diagnosis not present

## 2016-08-13 DIAGNOSIS — C7951 Secondary malignant neoplasm of bone: Secondary | ICD-10-CM | POA: Diagnosis not present

## 2016-08-13 NOTE — Telephone Encounter (Signed)
Nurse Summer at [facility] states Billy Lopez is now long term and his HIV meds will be coming from their pharmacy from now on.

## 2016-08-15 DIAGNOSIS — L89894 Pressure ulcer of other site, stage 4: Secondary | ICD-10-CM | POA: Diagnosis not present

## 2016-08-15 DIAGNOSIS — L97429 Non-pressure chronic ulcer of left heel and midfoot with unspecified severity: Secondary | ICD-10-CM | POA: Diagnosis not present

## 2016-08-15 DIAGNOSIS — L89314 Pressure ulcer of right buttock, stage 4: Secondary | ICD-10-CM | POA: Diagnosis not present

## 2016-08-15 DIAGNOSIS — L89514 Pressure ulcer of right ankle, stage 4: Secondary | ICD-10-CM | POA: Diagnosis not present

## 2016-08-21 DIAGNOSIS — R627 Adult failure to thrive: Secondary | ICD-10-CM | POA: Diagnosis not present

## 2016-08-22 DIAGNOSIS — L89314 Pressure ulcer of right buttock, stage 4: Secondary | ICD-10-CM | POA: Diagnosis not present

## 2016-08-22 DIAGNOSIS — L89514 Pressure ulcer of right ankle, stage 4: Secondary | ICD-10-CM | POA: Diagnosis not present

## 2016-08-22 DIAGNOSIS — L97422 Non-pressure chronic ulcer of left heel and midfoot with fat layer exposed: Secondary | ICD-10-CM | POA: Diagnosis not present

## 2016-08-22 DIAGNOSIS — L89894 Pressure ulcer of other site, stage 4: Secondary | ICD-10-CM | POA: Diagnosis not present

## 2016-08-23 DIAGNOSIS — R5383 Other fatigue: Secondary | ICD-10-CM | POA: Diagnosis not present

## 2016-08-26 DIAGNOSIS — E785 Hyperlipidemia, unspecified: Secondary | ICD-10-CM | POA: Diagnosis not present

## 2016-08-26 DIAGNOSIS — R5383 Other fatigue: Secondary | ICD-10-CM | POA: Diagnosis not present

## 2016-08-27 ENCOUNTER — Other Ambulatory Visit: Payer: Self-pay | Admitting: Oncology

## 2016-08-27 MED FILL — ZYTIGA 250 MG TABLET: 250 | 30 days supply | Qty: 120 | Fill #0

## 2016-08-29 DIAGNOSIS — L97429 Non-pressure chronic ulcer of left heel and midfoot with unspecified severity: Secondary | ICD-10-CM | POA: Diagnosis not present

## 2016-08-29 DIAGNOSIS — L89314 Pressure ulcer of right buttock, stage 4: Secondary | ICD-10-CM | POA: Diagnosis not present

## 2016-09-26 IMAGING — MR MR THORACIC SPINE W/O CM
6 of 13 series · 17 of 48 positions shown · IV contrast (Yes)
Comparison: Bone scan 04/13/2015.

CLINICAL DATA: Paraplegia with loss of sensation from the mid back
downward. Symptoms began 1 week ago.

EXAM:
MRI THORACIC SPINE WITHOUT CONTRAST; MRI CERVICAL SPINE WITHOUT
CONTRAST
TECHNIQUE: Multiplanar and multiecho pulse sequences of the thoracic spine were
obtained without intravenous contrast.; Multiplanar and multiecho
pulse sequences of the cervical spine, to include the craniocervical
junction and cervicothoracic junction, were obtained according to
standard protocol without intravenous contrast.
CONTRAST:  None. Contrast could not be given because patient
received gadolinium 12 hours earlier for the lumbar MRI.

[Series 2: T1 · sagittal · 3.0mm · 0.41mm/px · 1 of 13 slices shown (1 of 2)]
[im 1/13]
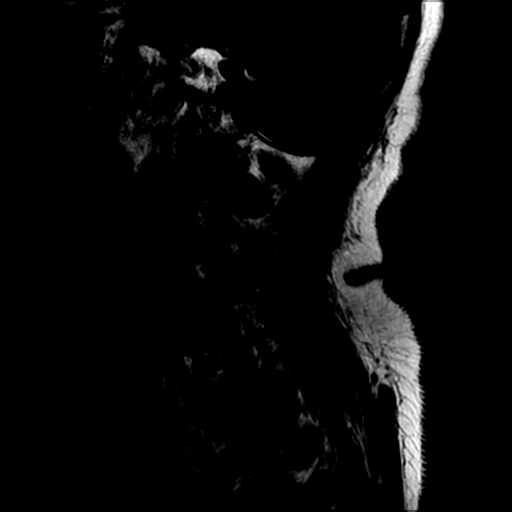

[Series 3: T2 · sagittal · 3.0mm · 0.41mm/px · 1 of 13 slices shown (1 of 4)]
[im 1/13]
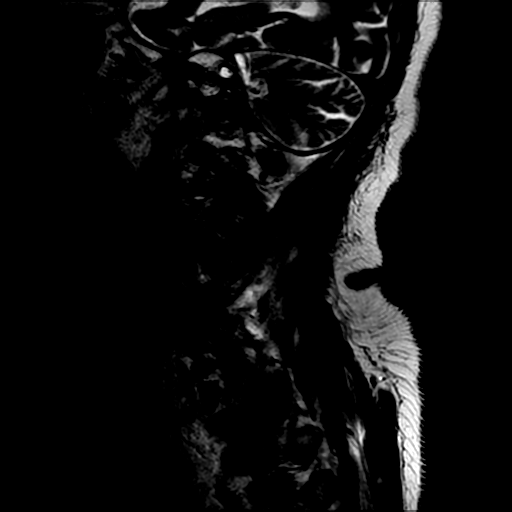

[Series 5: T2 · axial · 3.0mm · 0.35mm/px · z∈[-97,+16]mm · 5 of 38 slices shown (2 of 4)]
[im 1/38]
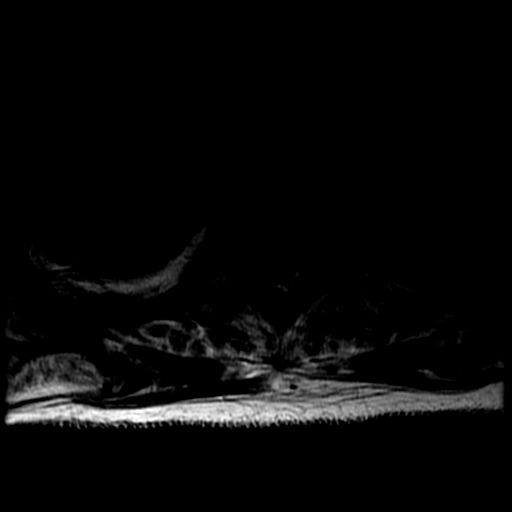
[im 10/38]
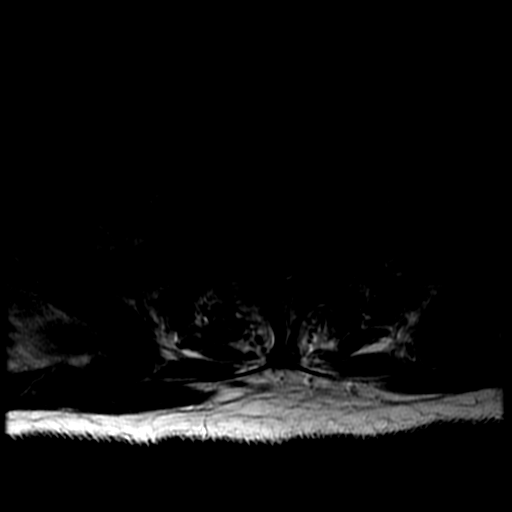
[im 19/38]
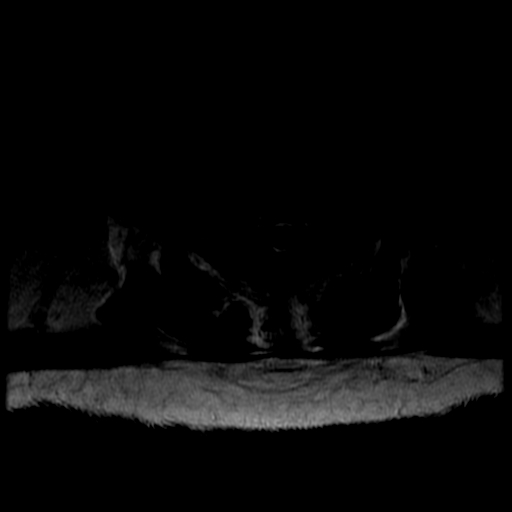
[im 28/38]
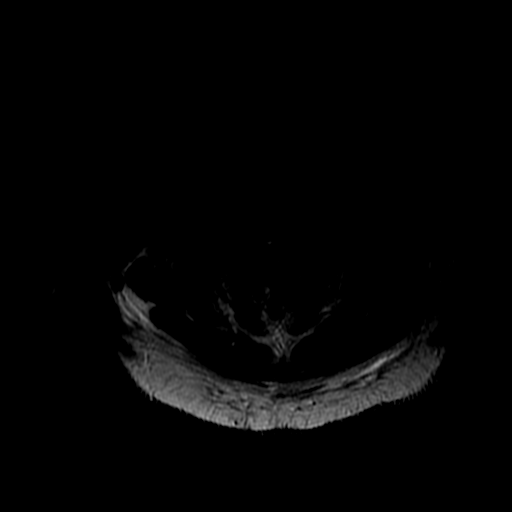
[im 38/38]
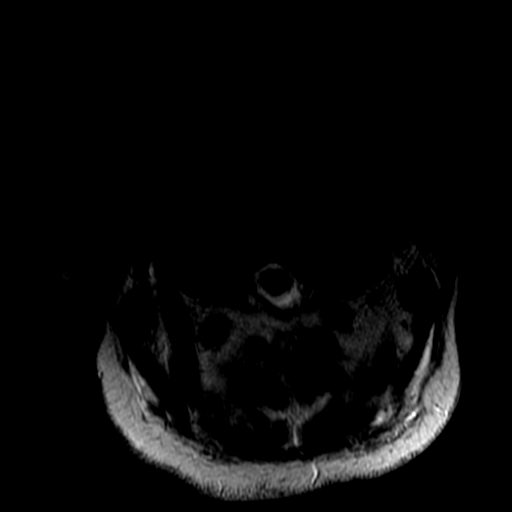

[Series 7: T1 · axial · 3.0mm · 0.35mm/px · 1 of 38 slices shown (2 of 2)]
[im 1/38]
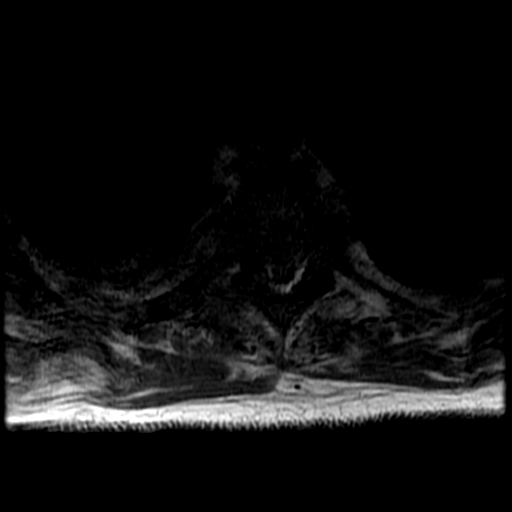

[Series 12: T2 · sagittal · 3.0mm · 0.64mm/px · 2 of 15 slices shown (3 of 4)]
[im 1/15]
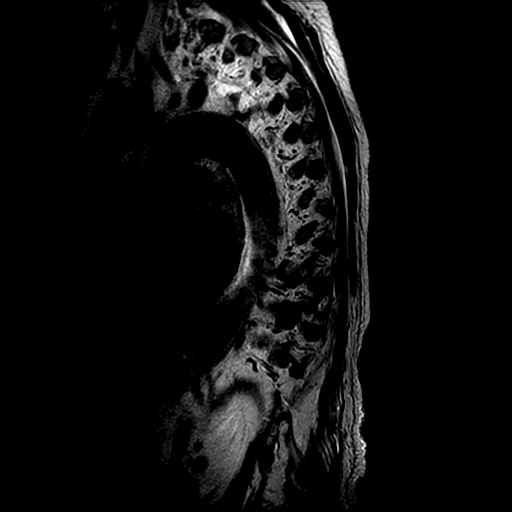
[im 15/15]
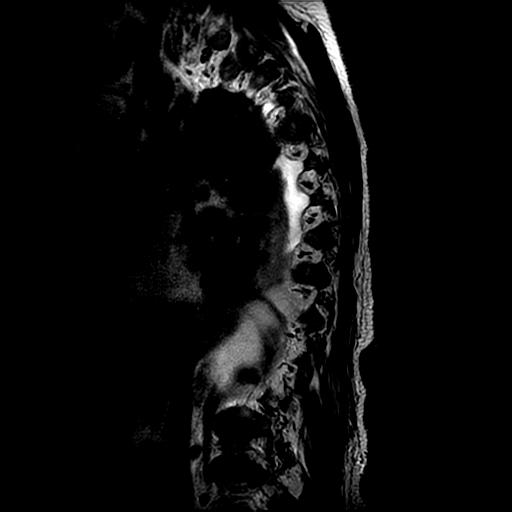

[Series 14: T2 · axial · 4.0mm · 0.39mm/px · z∈[-229,-27]mm · 7 of 48 slices shown (4 of 4)]
[im 1/48]
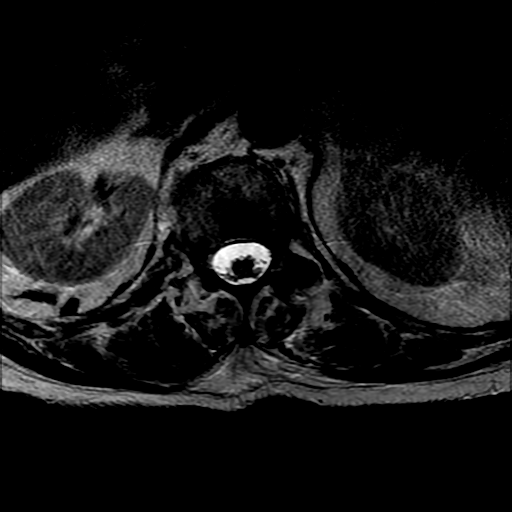
[im 8/48]
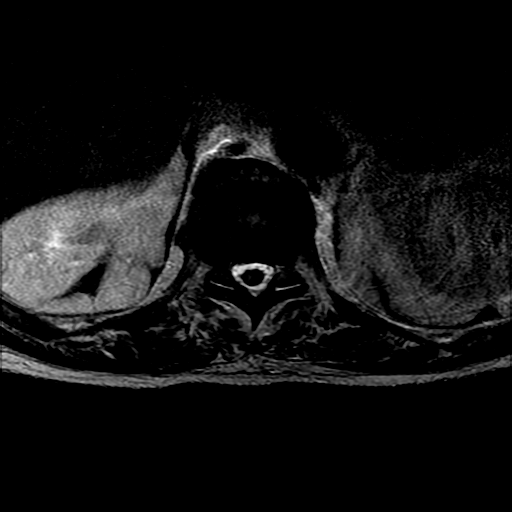
[im 16/48]
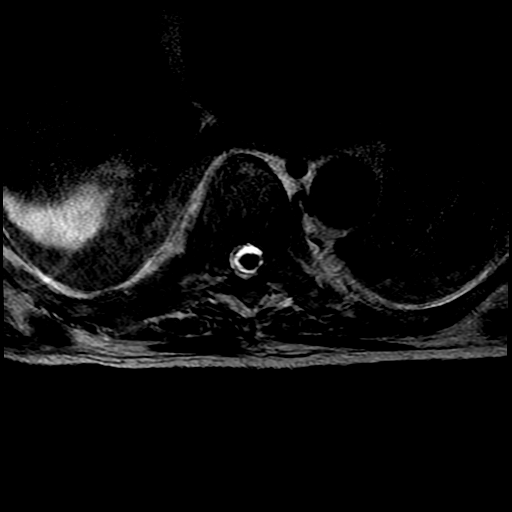
[im 24/48]
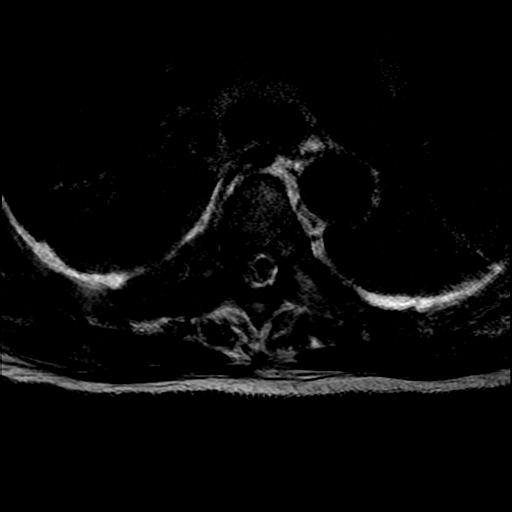
[im 32/48]
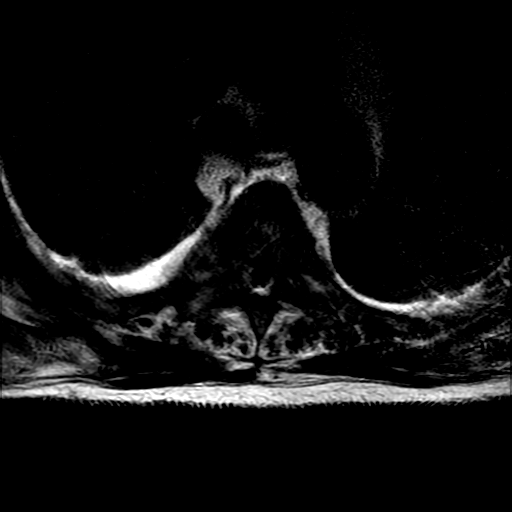
[im 40/48]
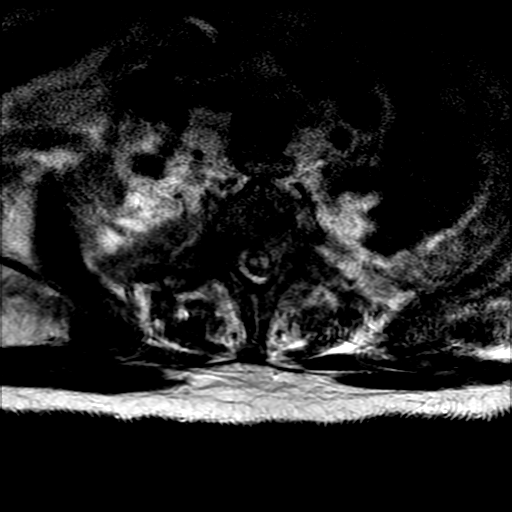
[im 48/48]
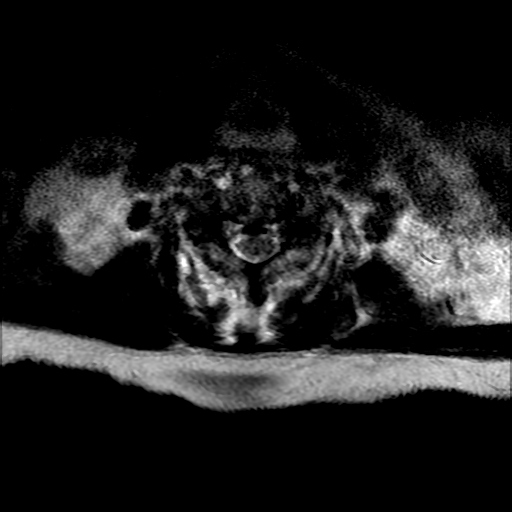

[17 of 48 positions shown; findings below may reference images not displayed]

FINDINGS: CERVICAL:

Alignment: Straightening of the normal cervical lordosis.

Vertebrae: Widespread osseous metastatic disease. Endplate
softening, due to a combination of degenerative disc disease and
tumor, has resulted in mild diffuse loss of vertebral body height
particularly from C3 through C6. No dominant compression fracture or
retropulsed bony fragment. Metastatic disease is seen to the clivus.

Cord: Mild cord flattening due to spondylosis at multiple levels
described below. No definite epidural tumor in the cervical region.

Posterior Fossa: Unremarkable.

Vertebral Arteries: Patent.

Paraspinal tissues: Unremarkable

Disc levels:

C2-3:  Annular bulge.

C3-4: Central protrusion with stenosis. BILATERAL foraminal
narrowing. Mild cord flattening.

C4-5: Central protrusion with stenosis. BILATERAL foraminal
narrowing. Mild cord flattening.

C5-6: Annular bulge with osseous ridging. Central stenosis with
BILATERAL foraminal narrowing. Moderate cord flattening.

C6-7: Central protrusion with stenosis. BILATERAL foraminal
narrowing. Mild cord flattening.

C7-T1:  Unremarkable.

THORACIC: Numbering of the thoracic spine was accomplished by
counting down the odontoid. Normal segmentation is present. Normal
alignment.

Widespread metastatic disease is seen in all thoracic vertebrae.
Early loss of vertebral body height at T4 with slight retropulsion.
Posterior element involvement most notable at T2. BILATERAL rib
involvement most notable at T2, RIGHT greater than LEFT, T5, RIGHT
greater than LEFT, along with BILATERAL pleural effusions
representing metastatic disease outside the spinal axis.

There is abnormal cord signal extending from C7-T1 through T2-T3
related to severe cord compression at the T2 level due to epidural
tumor.

The individual disc spaces are examined as follows:

T1-2: Minor disc pathology. Epidural tumor surrounds the cord
resulting in severe compression an abnormal central cord signal. A
large deposit is present dorsally related to posterior element
involvement. See image 9 series 12.

T2-3: Minor disc pathology. Abnormal cord signal extends to the T2-3
interspace. Dorsal and ventral epidural tumor, greater on the RIGHT.

T3-4:  RIGHT-sided epidural tumor without cord compression.

T4-5: Pathologic T4 fracture. Epidural tumor extends ventrally and
to the RIGHT resulting and stenosis and mild cord displacement. No
abnormal cord signal.

T5-6:  Unremarkable.

T6-7:  Central and leftward protrusion.  No cord compression.

T7-8:  Unremarkable disc space.  Early epidural tumor on the LEFT.

T8-9:  Annular bulge.

T9-10:  Unremarkable.

T10-11:  Central protrusion.

T11-12:  Annular bulge.
IMPRESSION: Widespread metastatic disease throughout the cervical and thoracic
spine. Early pathologic compression at T4. Mild diffuse loss of
cervical vertebrae height C3-C6.

Significant cord compression due to epidural tumor at the T2 level,
with abnormal cord signal. See discussion above.

Extensive extra-spinal tumor, involving the RIGHT ribs, greatest at
T5.

Critical Value/emergent results were called by telephone at the time
of interpretation on 02/02/2016 at [DATE] to Dr. Rankov, who verbally
acknowledged these results.

## 2016-10-03 ENCOUNTER — Other Ambulatory Visit: Payer: Self-pay | Admitting: Oncology

## 2016-10-03 MED FILL — ZYTIGA 250 MG TABLET: 250 | 30 days supply | Qty: 120 | Fill #0

## 2016-11-18 ENCOUNTER — Ambulatory Visit: Payer: Commercial Managed Care - HMO | Admitting: Internal Medicine

## 2016-12-01 DEATH — deceased
